# Patient Record
Sex: Male | Born: 1935
Health system: Southern US, Community
[De-identification: ages and names within clinical notes are randomized; demographics above are authoritative.]

## PROBLEM LIST (undated history)

## (undated) DIAGNOSIS — Z950 Presence of cardiac pacemaker: Secondary | ICD-10-CM

## (undated) DIAGNOSIS — I10 Essential (primary) hypertension: Secondary | ICD-10-CM

## (undated) DIAGNOSIS — I5189 Other ill-defined heart diseases: Secondary | ICD-10-CM

## (undated) DIAGNOSIS — I2510 Atherosclerotic heart disease of native coronary artery without angina pectoris: Secondary | ICD-10-CM

## (undated) DIAGNOSIS — I495 Sick sinus syndrome: Secondary | ICD-10-CM

## (undated) DIAGNOSIS — I499 Cardiac arrhythmia, unspecified: Secondary | ICD-10-CM

## (undated) DIAGNOSIS — M1990 Unspecified osteoarthritis, unspecified site: Secondary | ICD-10-CM

## (undated) DIAGNOSIS — IMO0002 Reserved for concepts with insufficient information to code with codable children: Secondary | ICD-10-CM

## (undated) DIAGNOSIS — K562 Volvulus: Secondary | ICD-10-CM

## (undated) DIAGNOSIS — H269 Unspecified cataract: Secondary | ICD-10-CM

## (undated) DIAGNOSIS — K5792 Diverticulitis of intestine, part unspecified, without perforation or abscess without bleeding: Secondary | ICD-10-CM

## (undated) DIAGNOSIS — H9190 Unspecified hearing loss, unspecified ear: Secondary | ICD-10-CM

## (undated) DIAGNOSIS — K5909 Other constipation: Secondary | ICD-10-CM

## (undated) DIAGNOSIS — E785 Hyperlipidemia, unspecified: Secondary | ICD-10-CM

## (undated) DIAGNOSIS — N400 Enlarged prostate without lower urinary tract symptoms: Secondary | ICD-10-CM

## (undated) HISTORY — DX: Essential (primary) hypertension: I10

## (undated) HISTORY — DX: Reserved for concepts with insufficient information to code with codable children: IMO0002

## (undated) HISTORY — DX: Volvulus: K56.2

## (undated) HISTORY — DX: Benign prostatic hyperplasia without lower urinary tract symptoms: N40.0

## (undated) HISTORY — DX: Presence of cardiac pacemaker: Z95.0

## (undated) HISTORY — DX: Other constipation: K59.09

## (undated) HISTORY — DX: Sick sinus syndrome: I49.5

## (undated) HISTORY — DX: Unspecified osteoarthritis, unspecified site: M19.90

## (undated) HISTORY — DX: Hyperlipidemia, unspecified: E78.5

## (undated) HISTORY — PX: VASECTOMY: SHX75

## (undated) HISTORY — DX: Diverticulitis of intestine, part unspecified, without perforation or abscess without bleeding: K57.92

---

## 2005-04-23 ENCOUNTER — Encounter (INDEPENDENT_AMBULATORY_CARE_PROVIDER_SITE_OTHER): Payer: Self-pay | Admitting: *Deleted

## 2005-04-23 ENCOUNTER — Ambulatory Visit (HOSPITAL_COMMUNITY): Admission: RE | Admit: 2005-04-23 | Discharge: 2005-04-23 | Payer: Self-pay | Admitting: Gastroenterology

## 2005-10-08 LAB — HM COLONOSCOPY

## 2006-07-10 ENCOUNTER — Encounter: Payer: Self-pay | Admitting: Internal Medicine

## 2007-01-30 ENCOUNTER — Ambulatory Visit: Payer: Self-pay | Admitting: Unknown Physician Specialty

## 2007-02-11 ENCOUNTER — Ambulatory Visit: Payer: Self-pay | Admitting: Unknown Physician Specialty

## 2008-12-01 ENCOUNTER — Encounter: Payer: Self-pay | Admitting: Internal Medicine

## 2009-07-12 ENCOUNTER — Ambulatory Visit: Payer: Self-pay | Admitting: Internal Medicine

## 2009-07-12 DIAGNOSIS — N401 Enlarged prostate with lower urinary tract symptoms: Secondary | ICD-10-CM

## 2009-07-12 DIAGNOSIS — E785 Hyperlipidemia, unspecified: Secondary | ICD-10-CM

## 2009-07-12 DIAGNOSIS — J309 Allergic rhinitis, unspecified: Secondary | ICD-10-CM | POA: Insufficient documentation

## 2009-07-12 DIAGNOSIS — I10 Essential (primary) hypertension: Secondary | ICD-10-CM | POA: Insufficient documentation

## 2009-07-12 DIAGNOSIS — N400 Enlarged prostate without lower urinary tract symptoms: Secondary | ICD-10-CM | POA: Insufficient documentation

## 2009-07-12 DIAGNOSIS — M1990 Unspecified osteoarthritis, unspecified site: Secondary | ICD-10-CM | POA: Insufficient documentation

## 2009-07-12 DIAGNOSIS — N138 Other obstructive and reflux uropathy: Secondary | ICD-10-CM

## 2009-07-13 LAB — CONVERTED CEMR LAB
CO2: 30 meq/L (ref 19–32)
Cholesterol: 230 mg/dL — ABNORMAL HIGH (ref 0–200)
Creatinine, Ser: 1.1 mg/dL (ref 0.4–1.2)
Direct LDL: 176.1 mg/dL
Eosinophils Relative: 1.4 % (ref 0.0–5.0)
GFR calc non Af Amer: 51.54 mL/min (ref >60)
HCT: 46.8 % — ABNORMAL HIGH (ref 36.0–46.0)
HDL: 44.8 mg/dL (ref >39.00)
Lymphocytes Relative: 32.2 % (ref 12.0–46.0)
Lymphs Abs: 1.9 10*3/uL (ref 0.7–4.0)
Monocytes Relative: 9.4 % (ref 3.0–12.0)
Neutrophils Relative %: 56.5 % (ref 43.0–77.0)
Platelets: 244 10*3/uL (ref 150.0–400.0)
Sodium: 140 meq/L (ref 135–145)
TSH: 1.29 microintl units/mL (ref 0.35–5.50)
Total Protein: 8.4 g/dL — ABNORMAL HIGH (ref 6.0–8.3)
Triglycerides: 102 mg/dL (ref 0.0–149.0)
VLDL: 20.4 mg/dL (ref 0.0–40.0)
WBC: 6 10*3/uL (ref 4.5–10.5)

## 2009-09-07 DIAGNOSIS — IMO0002 Reserved for concepts with insufficient information to code with codable children: Secondary | ICD-10-CM

## 2009-09-07 DIAGNOSIS — I480 Paroxysmal atrial fibrillation: Secondary | ICD-10-CM

## 2009-09-07 HISTORY — DX: Paroxysmal atrial fibrillation: I48.0

## 2009-09-07 HISTORY — DX: Reserved for concepts with insufficient information to code with codable children: IMO0002

## 2009-09-23 ENCOUNTER — Ambulatory Visit: Payer: Self-pay | Admitting: Cardiology

## 2009-09-23 ENCOUNTER — Encounter: Payer: Self-pay | Admitting: Family Medicine

## 2009-09-23 ENCOUNTER — Ambulatory Visit: Payer: Self-pay | Admitting: Family Medicine

## 2009-09-23 ENCOUNTER — Inpatient Hospital Stay (HOSPITAL_COMMUNITY): Admission: EM | Admit: 2009-09-23 | Discharge: 2009-09-25 | Payer: Self-pay | Admitting: Emergency Medicine

## 2009-09-27 ENCOUNTER — Telehealth: Payer: Self-pay | Admitting: Cardiology

## 2009-09-29 ENCOUNTER — Telehealth (INDEPENDENT_AMBULATORY_CARE_PROVIDER_SITE_OTHER): Payer: Self-pay | Admitting: *Deleted

## 2009-10-03 ENCOUNTER — Encounter: Payer: Self-pay | Admitting: Internal Medicine

## 2009-10-03 ENCOUNTER — Ambulatory Visit: Payer: Self-pay

## 2009-10-03 ENCOUNTER — Ambulatory Visit: Payer: Self-pay | Admitting: Cardiology

## 2009-10-03 ENCOUNTER — Ambulatory Visit (HOSPITAL_COMMUNITY): Admission: RE | Admit: 2009-10-03 | Discharge: 2009-10-03 | Payer: Self-pay | Admitting: Internal Medicine

## 2009-10-03 ENCOUNTER — Encounter: Payer: Self-pay | Admitting: Cardiology

## 2009-10-03 ENCOUNTER — Encounter (HOSPITAL_COMMUNITY): Admission: RE | Admit: 2009-10-03 | Discharge: 2009-12-12 | Payer: Self-pay | Admitting: Internal Medicine

## 2009-10-05 ENCOUNTER — Telehealth: Payer: Self-pay | Admitting: Internal Medicine

## 2009-10-06 ENCOUNTER — Ambulatory Visit: Payer: Self-pay | Admitting: Cardiology

## 2009-10-06 DIAGNOSIS — I498 Other specified cardiac arrhythmias: Secondary | ICD-10-CM

## 2009-10-06 DIAGNOSIS — I4891 Unspecified atrial fibrillation: Secondary | ICD-10-CM

## 2009-10-06 DIAGNOSIS — I480 Paroxysmal atrial fibrillation: Secondary | ICD-10-CM | POA: Insufficient documentation

## 2009-10-06 DIAGNOSIS — K7689 Other specified diseases of liver: Secondary | ICD-10-CM | POA: Insufficient documentation

## 2009-10-08 HISTORY — PX: OTHER SURGICAL HISTORY: SHX169

## 2009-10-10 ENCOUNTER — Telehealth: Payer: Self-pay | Admitting: Cardiology

## 2009-10-10 ENCOUNTER — Encounter: Payer: Self-pay | Admitting: Cardiology

## 2009-10-11 ENCOUNTER — Ambulatory Visit: Payer: Self-pay | Admitting: Cardiology

## 2009-10-11 ENCOUNTER — Encounter: Payer: Self-pay | Admitting: Cardiology

## 2009-10-12 ENCOUNTER — Telehealth: Payer: Self-pay | Admitting: Cardiology

## 2009-10-14 ENCOUNTER — Telehealth: Payer: Self-pay | Admitting: Cardiology

## 2009-10-17 ENCOUNTER — Telehealth: Payer: Self-pay | Admitting: Cardiology

## 2009-10-17 ENCOUNTER — Telehealth: Payer: Self-pay | Admitting: Internal Medicine

## 2009-10-17 ENCOUNTER — Ambulatory Visit: Payer: Self-pay | Admitting: Cardiovascular Disease

## 2009-10-20 ENCOUNTER — Ambulatory Visit: Payer: Self-pay | Admitting: Internal Medicine

## 2009-10-20 ENCOUNTER — Encounter: Payer: Self-pay | Admitting: Internal Medicine

## 2009-10-21 ENCOUNTER — Telehealth: Payer: Self-pay | Admitting: Internal Medicine

## 2009-10-24 LAB — CONVERTED CEMR LAB
Chloride: 99 meq/L (ref 96–112)
INR: 1.2 — ABNORMAL HIGH (ref 0.8–1.0)
Potassium: 3.8 meq/L (ref 3.5–5.1)
Prothrombin Time: 12.9 s — ABNORMAL HIGH (ref 9.1–11.7)
Sodium: 135 meq/L (ref 135–145)

## 2009-10-26 ENCOUNTER — Telehealth: Payer: Self-pay | Admitting: Internal Medicine

## 2009-10-27 ENCOUNTER — Ambulatory Visit: Payer: Self-pay | Admitting: Internal Medicine

## 2009-10-27 DIAGNOSIS — I495 Sick sinus syndrome: Secondary | ICD-10-CM

## 2009-11-03 ENCOUNTER — Inpatient Hospital Stay (HOSPITAL_COMMUNITY): Admission: RE | Admit: 2009-11-03 | Discharge: 2009-11-04 | Payer: Self-pay | Admitting: Internal Medicine

## 2009-11-03 ENCOUNTER — Ambulatory Visit: Payer: Self-pay | Admitting: Internal Medicine

## 2009-11-07 ENCOUNTER — Telehealth: Payer: Self-pay | Admitting: Internal Medicine

## 2009-11-07 ENCOUNTER — Ambulatory Visit: Payer: Self-pay | Admitting: Internal Medicine

## 2009-11-17 ENCOUNTER — Encounter: Payer: Self-pay | Admitting: Internal Medicine

## 2009-11-17 ENCOUNTER — Ambulatory Visit: Payer: Self-pay

## 2009-11-25 ENCOUNTER — Ambulatory Visit: Payer: Self-pay | Admitting: Internal Medicine

## 2009-11-25 DIAGNOSIS — F4322 Adjustment disorder with anxiety: Secondary | ICD-10-CM

## 2009-12-01 ENCOUNTER — Ambulatory Visit: Payer: Self-pay | Admitting: Cardiovascular Disease

## 2009-12-01 ENCOUNTER — Encounter: Payer: Self-pay | Admitting: Cardiology

## 2009-12-05 LAB — CONVERTED CEMR LAB
ALT: 19 units/L (ref 0–53)
AST: 18 units/L (ref 0–37)
Albumin: 4.5 g/dL (ref 3.5–5.2)
CO2: 26 meq/L (ref 19–32)
Calcium: 9.4 mg/dL (ref 8.4–10.5)
Chloride: 105 meq/L (ref 96–112)
Cholesterol: 237 mg/dL — ABNORMAL HIGH (ref 0–200)
Creatinine, Ser: 1.03 mg/dL (ref 0.40–1.50)
Potassium: 4.3 meq/L (ref 3.5–5.3)
Total CHOL/HDL Ratio: 4.2
Total Protein: 7.8 g/dL (ref 6.0–8.3)

## 2009-12-21 ENCOUNTER — Ambulatory Visit: Payer: Self-pay | Admitting: Cardiology

## 2009-12-28 ENCOUNTER — Telehealth: Payer: Self-pay | Admitting: Cardiology

## 2010-01-04 ENCOUNTER — Ambulatory Visit: Payer: Self-pay | Admitting: Cardiovascular Disease

## 2010-01-05 ENCOUNTER — Telehealth: Payer: Self-pay | Admitting: Cardiology

## 2010-01-05 ENCOUNTER — Encounter: Payer: Self-pay | Admitting: Cardiology

## 2010-01-10 ENCOUNTER — Telehealth: Payer: Self-pay | Admitting: Cardiology

## 2010-01-16 ENCOUNTER — Ambulatory Visit: Payer: Self-pay | Admitting: Internal Medicine

## 2010-01-16 ENCOUNTER — Ambulatory Visit: Payer: Self-pay | Admitting: Cardiology

## 2010-01-16 ENCOUNTER — Encounter: Payer: Self-pay | Admitting: Internal Medicine

## 2010-01-19 ENCOUNTER — Telehealth: Payer: Self-pay | Admitting: Cardiology

## 2010-01-20 ENCOUNTER — Ambulatory Visit: Payer: Self-pay | Admitting: Cardiology

## 2010-01-23 ENCOUNTER — Encounter: Payer: Self-pay | Admitting: Cardiology

## 2010-01-23 ENCOUNTER — Ambulatory Visit: Payer: Self-pay | Admitting: Internal Medicine

## 2010-02-06 ENCOUNTER — Ambulatory Visit: Payer: Self-pay | Admitting: Internal Medicine

## 2010-02-06 DIAGNOSIS — Z950 Presence of cardiac pacemaker: Secondary | ICD-10-CM

## 2010-02-06 DIAGNOSIS — Z45018 Encounter for adjustment and management of other part of cardiac pacemaker: Secondary | ICD-10-CM | POA: Insufficient documentation

## 2010-02-06 HISTORY — DX: Presence of cardiac pacemaker: Z95.0

## 2010-02-28 ENCOUNTER — Telehealth: Payer: Self-pay | Admitting: Cardiology

## 2010-04-17 ENCOUNTER — Ambulatory Visit: Payer: Self-pay | Admitting: Family Medicine

## 2010-04-17 DIAGNOSIS — M19019 Primary osteoarthritis, unspecified shoulder: Secondary | ICD-10-CM | POA: Insufficient documentation

## 2010-05-04 ENCOUNTER — Ambulatory Visit: Payer: Self-pay | Admitting: Internal Medicine

## 2010-05-04 DIAGNOSIS — L570 Actinic keratosis: Secondary | ICD-10-CM | POA: Insufficient documentation

## 2010-05-04 DIAGNOSIS — K5909 Other constipation: Secondary | ICD-10-CM

## 2010-07-11 ENCOUNTER — Ambulatory Visit: Payer: Self-pay | Admitting: Internal Medicine

## 2010-07-11 DIAGNOSIS — D179 Benign lipomatous neoplasm, unspecified: Secondary | ICD-10-CM | POA: Insufficient documentation

## 2010-09-08 ENCOUNTER — Ambulatory Visit: Payer: Self-pay | Admitting: Internal Medicine

## 2010-10-25 ENCOUNTER — Telehealth (INDEPENDENT_AMBULATORY_CARE_PROVIDER_SITE_OTHER): Payer: Self-pay | Admitting: *Deleted

## 2010-10-25 ENCOUNTER — Telehealth: Payer: Self-pay | Admitting: Cardiology

## 2010-11-05 LAB — CONVERTED CEMR LAB
Basophils Relative: 0.3 % (ref 0.0–3.0)
Chloride: 104 meq/L (ref 96–112)
Eosinophils Relative: 0.8 % (ref 0.0–5.0)
GFR calc non Af Amer: 77.47 mL/min (ref >60)
HCT: 45.6 % (ref 39.0–52.0)
INR: 1.1 — ABNORMAL HIGH (ref 0.8–1.0)
Lymphs Abs: 1.8 10*3/uL (ref 0.7–4.0)
MCV: 92.6 fL (ref 78.0–100.0)
Monocytes Absolute: 0.5 10*3/uL (ref 0.1–1.0)
Monocytes Relative: 10.8 % (ref 3.0–12.0)
Neutrophils Relative %: 51.8 % (ref 43.0–77.0)
Potassium: 4.3 meq/L (ref 3.5–5.1)
RBC: 4.92 M/uL (ref 4.22–5.81)
WBC: 5 10*3/uL (ref 4.5–10.5)

## 2010-11-07 NOTE — Progress Notes (Signed)
Summary: question regarding upcoming surgery  Phone Note Call from Patient Call back at Home Phone [phone]   Caller: Patient Reason for Call: Talk to Nurse Details for Reason: pt has question regarding upcoming surgery.  Initial call taken by: Lorne Skeens,  October 26, 2009 8:25 AM  Follow-up for Phone Call        pt to be seen tomorrow at 10:30.  Dr Ladona Ridgel aware.  Pt d/c'd Coumadin due to HA's may want to take Pradaxa and has questions regarding PPM thinks its going to cure his afib.  I let him know that the PPm would not cure his afib but would enable [place] to use meds to help control it.  He wants to talk with Dr Ladona Ridgel to have a better understanding before the 27th. Dennis Bast, RN, BSN  October 26, 2009 9:54 AM

## 2010-11-07 NOTE — Assessment & Plan Note (Signed)
Summary: EKG/AMD  Nurse Visit   Allergies: No Known Drug Allergies  Orders Added: 1)  EKG w/ Interpretation [93000] 

## 2010-11-07 NOTE — Procedures (Signed)
Summary: PC2   Allergies: No Known Drug Allergies  PPM Specifications Following MD:  [name], MD     PPM Vendor:  Medtronic     PPM Model Number:  [id number]     PPM Serial Number:  [id number] PPM DOI:  11/03/2009     PPM Implanting MD:  [name], MD  Lead 1    Location: RA     DOI: 11/03/2009     Model #: [id number]     Serial #: [id number]     Status: active Lead 2    Location: RV     DOI: 11/03/2009     Model #: [id number]     Serial #: [id number]     Status: active   Indications:  Sinus Brady/Afib   PPM Follow Up Remote Check?  No Battery Voltage:  2.80 V     Battery Est. Longevity:  8.5 YEARS     Pacer Dependent:  No       PPM Device Measurements Atrium  Amplitude: 5.6 mV, Impedance: 599 ohms, Threshold: 0.625 V at 0.4 msec Right Ventricle  Amplitude: 5.6 mV, Impedance: 736 ohms, Threshold: 0.5 V at 0.4 msec  Episodes MS Episodes:  0     Ventricular High Rate:  0     Atrial Pacing:  88.7%     Ventricular Pacing:  0.1%  Parameters Mode:  MVP(R)     Lower Rate Limit:  60     Upper Rate Limit:  130 Paced AV Delay:  150     Sensed AV Delay:  120 Rate Response Parameters:  ADL Response-3, Exertion Response-3, Threshold-Med/low, Acceleration-30 seconds, Deceleration-Exercise Tech Comments:  Pt seen as an add-on today because of concerns about a low pulse reading on digital blood pressure cuff at home.  Pt has frequent PAC's and occasional PVC's during check.  Normal device function. Histagrams flat, will reassess at wound check need for sensor adjustment.  Lead impedences, sensing, and thresholds stable.  Pt reassured.  Wound without edema, steri-strips will be removed at wound check appt on 2-10.  ROV as scheduled. Gypsy Balsam RN BSN  November 07, 2009 11:33 AM  MD Comments:  Agree with above.

## 2010-11-07 NOTE — Assessment & Plan Note (Signed)
Summary: NURSE/AMD  Nurse Visit   Patient Instructions: 1)  Your physician has recommended you make the following change in your medication: STOP MULTAQ, START FLECAINIDE 50MG  TWICE A DAY 2)  Your physician recommends that you schedule a follow-up appointment in: 1 week for EKG, 3-4 weeks with Dr. Shirlee Latch   Allergies: No Known Drug Allergies Prescriptions: FLECAINIDE ACETATE 50 MG TABS (FLECAINIDE ACETATE) Take one tablet by mouth every 12 hours  #60 x 6   Entered by:   Charlena Cross, RN, BSN   Authorized by:   Marca Ancona, MD   Signed by:   Charlena Cross, RN, BSN on 01/16/2010   Method used:   Electronically to        [facility] [address]* (retail)       [address]       [place], [place]  [address]       Ph: [phone]       Fax: [phone]   RxID:   [id number]    PPM Specifications Following MD:  [name], MD     Referring MD:  Jacklynn Barnacle Vendor:  Medtronic     PPM Model Number:  ADDRL1     PPM Serial Number:  [id number] PPM DOI:  11/03/2009     PPM Implanting MD:  [name], MD  Lead 1    Location: RA     DOI: 11/03/2009     Model #: [id number]     Serial #: [id number]     Status: active Lead 2    Location: RV     DOI: 11/03/2009     Model #: [id number]     Serial #: [id number]     Status: active  Magnet Response Rate:  BOL 85 ERI  65  Indications:  Sinus Brady/Afib   PPM Follow Up Remote Check?  Yes Battery Voltage:  2.8 V     Battery Est. Longevity:  8.5y     Pacer Dependent:  No       PPM Device Measurements Atrium  Amplitude: 4 mV,   Episodes Coumadin:  No  Parameters Mode:  MVP(R)     Lower Rate Limit:  60     Upper Rate Limit:  130 Paced AV Delay:  150     Sensed AV Delay:  120 Rate Response Parameters:  ADL Response-3, Exertion Response-3, Threshold-Med/low, Acceleration-30 seconds, Deceleration-Exercise

## 2010-11-07 NOTE — Progress Notes (Signed)
Summary: wants phone call   Phone Note Call from Patient Call back at Home Phone [phone]   Caller: Patient Call For: Cindee Salt MD Summary of Call: Patient is requesting phone call from you. He says that he is having problems with his heart again and he just wants you to give him a call at [phone]. Initial call taken by: Melody Comas,  October 17, 2009 12:21 PM  Follow-up for Phone Call        discussed with patient clearly having worrisome symptoms and will need to discuss options with the cardiologists Follow-up by: Cindee Salt MD,  October 17, 2009 1:52 PM

## 2010-11-07 NOTE — Progress Notes (Signed)
Summary: Questions  Phone Note Call from Patient Call back at Home Phone [phone]   Caller: Patient Call For: Dr. Alphonsus Sias Dr. Shirlee Latch Summary of Call: pt is calling with lots of concern with his heart. I advised pt that Dr. Alphonsus Sias is out of the office and will return on Monday. I also advised pt to call his cardiologist. Pt is not in pain, he is concerned about his irregular heart beat and his pulse. Pt is wearing a holter monitor now and will turn it  in tomorrow 10/13/2009. Pt states his heart goes in and out and pulse up and down. I told pt that the monitor would pick up those irregular beats and the would be more info for his cardiologist. Pt states he called here because he couldn't get in touch with his cardiologist and that Dr. Alphonsus Sias would answer his questions and relay this info to his cardiologist. I advised pt to try and call his cardiologist again and that I would send this note to both physicians. I asked again if he was in distress and he stated no. Initial call taken by: Mervin Hack CMA (AAMA),  October 12, 2009 2:41 PM     Appended Document: Questions I will look at the holter when he turns it in.  If he is having chest pain or his heart rate is fast, I can see him in the office.   Appended Document: Questions He may be back in atrial fibrillation

## 2010-11-07 NOTE — Letter (Signed)
Summary: Implantable Device Instructions  Architectural technologist, Main Office  [address]   [place], [place] [address]   Phone: [phone]  Fax: [phone]      Implantable Device Instructions  You are scheduled for:  _____ Permanent Transvenous Pacemaker   on 11/03/09 with Dr. Ladona Ridgel  1.  Please arrive at the Short Stay Center at [facility] at 8:00am on the day of your procedure.  2.  Do not eat or drink after midnight  the night before your procedure.  3.  Complete lab work on 10/27/09 in [employer] office    You do not have to be fasting.  4.    Take your last dose of Coumadin on--will call if you need to hold  5.  Plan for an overnight stay.  Bring your insurance cards and a list of your medications.  6.  Wash your chest and neck with antibacterial soap (any brand) the evening before and the morning of your procedure.  Rinse well.  7.  Education material received:     Pacemaker _____            *If you have ANY questions after you get home, please call the office [phone]. Anselm Pancoast  *Every attempt is made to prevent procedures from being rescheduled.  Due to the nauture of Electrophysiology, rescheduling can happen.  The physician is always aware and directs the staff when this occurs.

## 2010-11-07 NOTE — Cardiovascular Report (Signed)
Summary: Office Visit   Office Visit   Imported By: Roderic Ovens 11/09/2009 16:03:08  _____________________________________________________________________  External Attachment:    Type:   Image     Comment:   External Document

## 2010-11-07 NOTE — Assessment & Plan Note (Signed)
Summary: QUESTION REGUARDING STRESS ON RECENT PACEMAKER/JRR   Vital Signs:  Patient profile:   77 year old male Weight:      173 pounds Temp:     98.1 degrees F oral Pulse rate:   72 / minute Pulse rhythm:   regular BP sitting:   140 / 90  (left arm) Cuff size:   large  Vitals Entered By: Mervin Hack CMA Duncan Dull) (November 25, 2009 11:48 AM) CC: question about meds   History of Present Illness: had pacemaker put in about 3 weeks ago Inserted for tachy=brady syndrome  doing well in general He senses it fire off---may only be for bradycardia  Has had several anxiety spells he notes when his heart starts racing "It feels so strange for me to have something foreign in me"  Wife gave him 1/2 of her alprazolam 0.5mg  really helped him calm down Heart rate normalized, BP was better to his check No true panic symptoms has had some anxiety feelings--then he notes his heart speed up  No true chest pain but notes pain when "that thing kicks in" No SOB  Allergies: No Known Drug Allergies  Past History:  Past medical, surgical, family and social histories (including risk factors) reviewed for relevance to current acute and chronic problems.  Past Medical History: Reviewed history from 10/06/2009 and no changes required. 1. Hyperlipidemia 2. Hypertension 3. Osteoarthritis 4. BPH 5. Allergic rhinitis 6. Atrial fibrillation: First noted in 12/10.  Has been paroxysmal.  CHADSVASC score = 2 but patient does not want coumadin.  Echo (12/10): Mild LVH, EF 55%, no regional wall motion abnormalities, moderate diastolic dysfunction, mild MR, mild biatrial enlargement, normal RV.  7.  Sinus bradycardia: Has limited rate control meds for atrial fibrillation.  8.  Chest pain: Lexiscan myoview (12/10) with EF 55%, no ischemia or infarction.    Past Surgical History: Vasectomy in his 13's Dual chamber pacemaker 1/11 --Dr Ladona Ridgel  Family History: Reviewed history from 07/12/2009 and no  changes required. Mom died young from lung disease. Was smoker. Had HTN Dad died young of alcohol complications 1 brother, 2 sisters--general good health No CAD DM in great aunt No prostate or colon cancer  Social History: Reviewed history from 07/12/2009 and no changes required. 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 Has requested wife to make health care decisions for him as needed. Would accept CPR. Not sure about feeding tube.  Review of Systems       sleeps okay but has been awakened by pacemaker "kicking in" Appetite is okay  Physical Exam  General:  alert and normal appearance.   Neck:  supple, no masses, no thyromegaly, and no cervical lymphadenopathy.   Lungs:  normal respiratory effort and normal breath sounds.   Heart:  normal rate, regular rhythm, no murmur, and no gallop.   Extremities:  no edema Psych:  normally interactive, good eye contact, not anxious appearing, and not depressed appearing.     Impression & Recommendations:  Problem # 1:  ADJUSTMENT DISORDER WITH ANXIETY (ICD-309.24) Assessment New at first hard to tell whether tachycardia caused the anxiety or vice versa Likely anxiety first--since he thinks his rates are under 100  will give Rx for alprazolam for as needed use  Problem # 2:  SINOATRIAL NODE DYSFUNCTION (ICD-427.81) Assessment: Comment Only if persistent tachycardia, now can be treated since pacer in  His updated medication list for  this problem includes:    Aspirin Ec 325 Mg Tbec (Aspirin) .[name]... Take one tablet by mouth daily  Problem # 3:  HYPERTENSION (ICD-401.9) Assessment: Unchanged BP fine  His updated medication list for this problem includes:    Amlodipine Besylate 10 Mg Tabs (Amlodipine besylate) .[name]... Take 1 by mouth once daily  BP today: 140/90 Prior BP: 130/80 (10/27/2009)  Labs Reviewed: K+: 4.3  (10/27/2009) Creat: : 1.0 (10/27/2009)   Chol: 230 (07/12/2009)   HDL: 44.80 (07/12/2009)   TG: 102.0 (07/12/2009)  Complete Medication List: 1)  Amlodipine Besylate 10 Mg Tabs (Amlodipine besylate) .... Take 1 by mouth once daily 2)  Aspirin Ec 325 Mg Tbec (Aspirin) .... Take one tablet by mouth daily 3)  Ibuprofen 200 Mg Tabs (Ibuprofen) .... As needed 4)  Glucosamine 500 Mg Caps (Glucosamine sulfate) .... Once daily 5)  Alprazolam 0.25 Mg Tabs (Alprazolam) .[name].. 1 by mouth two times a day as needed for anxiety spells  Patient Instructions: 1)  Please keep follow up visit on April 11th Prescriptions: ALPRAZOLAM 0.25 MG TABS (ALPRAZOLAM) 1 by mouth two times a day as needed for anxiety spells  #30 x 0   Entered and Authorized by:   Cindee Salt MD   Signed by:   Cindee Salt MD on 11/25/2009   Method used:   Print then Give to Patient   RxID:   [id number]   Current Allergies (reviewed today): No known allergies

## 2010-11-07 NOTE — Assessment & Plan Note (Signed)
Summary: HOLTER/AMD  Nurse Visit   Allergies: No Known Drug Allergies  Orders Added: 1)  Holter Monitor [Holter Monitor] 

## 2010-11-07 NOTE — Miscellaneous (Signed)
  Per Dr. Shirlee Latch- stop amlopidine, start dilt 240mg    Clinical Lists Changes  Medications: Changed medication from AMLODIPINE BESYLATE 10 MG TABS (AMLODIPINE BESYLATE) take 1 by mouth once daily to DILTIAZEM HCL ER BEADS 240 MG XR24H-CAP (DILTIAZEM HCL ER BEADS) 1 tab by mouth daily - Signed Rx of DILTIAZEM HCL ER BEADS 240 MG XR24H-CAP (DILTIAZEM HCL ER BEADS) 1 tab by mouth daily;  #30 x 3;  Signed;  Entered by: Charlena Cross, RN, BSN;  Authorized by: Marca Ancona, MD;  Method used: Electronically to [facility] [address], [place], [place]  [address], Ph: [phone], Fax: [phone]    Prescriptions: DILTIAZEM HCL ER BEADS 240 MG XR24H-CAP (DILTIAZEM HCL ER BEADS) 1 tab by mouth daily  #30 x 3   Entered by:   Charlena Cross, RN, BSN   Authorized by:   Marca Ancona, MD   Signed by:   Charlena Cross, RN, BSN on 01/05/2010   Method used:   Electronically to        [facility] [address] (retail)       [address]       [place], [place]  [address]       Ph: [phone]       Fax: [phone]   RxID:   [id number]

## 2010-11-07 NOTE — Progress Notes (Signed)
Summary: pace maker  Phone Note Call from Patient Call back at Home Phone [phone]   Caller: Patient Reason for Call: Talk to Nurse Summary of Call: pt believes that his pacemaker is not working, pulse is in the 40s Initial call taken by: Migdalia Dk,  November 07, 2009 8:15 AM  Follow-up for Phone Call        Called worried that his HR is in the 40's today and has not been this way since his PPM was placed.  He is checking his HR with automactic BP cuff.  I explained to him that if he is having PAC's the cuff may not be picking this up.  I offered him a time to come in and have his device checked.  He is coming at 11:00 today Dennis Bast, RN, BSN  November 07, 2009 9:16 AM

## 2010-11-07 NOTE — Cardiovascular Report (Signed)
Summary: Pre Op Orders  Pre Op Orders   Imported By: Roderic Ovens 11/08/2009 12:26:05  _____________________________________________________________________  External Attachment:    Type:   Image     Comment:   External Document

## 2010-11-07 NOTE — Assessment & Plan Note (Signed)
Summary: [name]   Visit Type:  Follow-up Primary Provider:  Cindee Salt MD  CC:  sob and cp this am bp 165/110 and heart rate 117.  History of Present Illness: 77 yo with history of paroxysmal atrial fibrillation and HTN followed by Dr. Shirlee Latch.  He was hospitalized in 12/10 with atrial fibrillation and rapid ventricular response associated with chest tightness.  Patient noted the chest tightness initially.  He went to Dr. Karle Starch office and afib with RVR was noted.  In the ER, he went back into NSR.  In the hospital, he was in and out of atrial fibrillation but was discharged in sinus rhythm and was in sinus rhythm at his hospital follow up with Dr. Shirlee Latch.  While he was in the hospital, his heart rate dropped as low as in the 30s.  Diltiazem was stopped and he was started on acebutalol.  He had a stress myoview done 12/10 which showed no ischemia or infarction, but his heart rate was in the 30s on only 200 mg daily of acebutalol so this was stopped. He also was started on heparin/coumadin in the hospital but had some bleeding in his gums so this was stopped.   He is added on to the schedule today for evaluation of palpitations. He recently wore a Holter Monitor that showed sinus brady in the 40s  with episodes of atrial fibrillation with RVR, rate 140s. There were post termination pauses of 2.5 to 3.8 seconds.  He has done well over the last week. Today he was at home when he noticed palpitations and felt anxious. There was some tightness across his chest. He checked his pulse and it was 117. His blood pressure was elevated at 150. He sat down and the palpitations stopped. His heart rate was then in the 40s. He felt a little weak when his heart rate was slow. He has not been on any rate control medications since discharge. He has been taking a full strength ASA.   Current Medications (verified): 1)  Amlodipine Besylate 10 Mg Tabs (Amlodipine Besylate) .... Take 1 By Mouth Once Daily 2)  Aspirin  Ec 325 Mg Tbec (Aspirin) .... Take One Tablet By Mouth Daily  Allergies (verified): No Known Drug Allergies  Past History:  Past Medical History: Reviewed history from 10/06/2009 and no changes required. 1. Hyperlipidemia 2. Hypertension 3. Osteoarthritis 4. BPH 5. Allergic rhinitis 6. Atrial fibrillation: First noted in 12/10.  Has been paroxysmal.  CHADSVASC score = 2 but patient does not want coumadin.  Echo (12/10): Mild LVH, EF 55%, no regional wall motion abnormalities, moderate diastolic dysfunction, mild MR, mild biatrial enlargement, normal RV.  7.  Sinus bradycardia: Has limited rate control meds for atrial fibrillation.  8.  Chest pain: Lexiscan myoview (12/10) with EF 55%, no ischemia or infarction.    Social History: Reviewed history from 07/12/2009 and no changes required. 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 Has requested wife to make health care decisions for him as needed. Would accept CPR. Not sure about feeding tube.  Review of Systems       The patient complains of fatigue, chest pain, and palpitations.  The patient denies malaise, fever, weight gain/loss, vision loss, decreased hearing, hoarseness, shortness of breath, prolonged cough, wheezing, sleep apnea, coughing up blood, abdominal pain, blood in stool, nausea, vomiting, diarrhea, heartburn, incontinence, blood in urine, muscle weakness, joint pain, leg swelling,  rash, skin lesions, headache, fainting, dizziness, depression, anxiety, enlarged lymph nodes, easy bruising or bleeding, and environmental allergies.    Vital Signs:  Patient profile:   77 year old male Height:      71 inches Weight:      182.25 pounds BMI:     25.51 Pulse rate:   58 / minute Pulse rhythm:   regular BP sitting:   122 / 70  (left arm) Cuff size:   regular  Vitals Entered By: [name] (October 17, 2009 4:04  PM)  Physical Exam  General:  General: Well developed, well nourished, NAD Musculoskeletal: Muscle strength 5/5 all ext Psychiatric: Mood and affect normal Neck: No JVD, no carotid bruits, no thyromegaly, no lymphadenopathy. Lungs:Clear bilaterally, no wheezes, rhonci, crackles CV: Bradycardic, No murmurs, gallops rubs Abdomen: soft, NT, ND, BS present Extremities: No edema, pulses 2+.    EKG  Procedure date:  10/17/2009  Findings:      Sinus bradycardia, rate 51 bpm. LAD. IVCD.  Holter Monitor  Procedure date:  10/17/2009  Findings:      Sinus rhythm with bradycardia to 40's. Atrial fibrillation with RVR to 140s and 2.5 to 3.8 second post termination pauses  Impression & Recommendations:  Problem # 1:  ATRIAL FIBRILLATION (ICD-427.31)  Tachy-brady syndrome. He has not tolerated rate control agents. His resting heart rate while in sinus is in the 40's-50s and when he goes into atrial fibrillation, there is RVR with rates in the 140s. He is clearly having episodes of atrial fib. I have discussed this at length with Dr. Shirlee Latch. I will begin coumadin today at 5mg  per day and he will be followed in our coumadin clinic. He will stop this if he develops any bleeding. He may need a permanent pacemaker. We also may consider hospitalization for Tikosyn load. I will arrange an appointment with Dr. Ladona Ridgel this week in the [facility] office to discuss options.    His updated medication list for this problem includes:    Aspirin Ec 325 Mg Tbec (Aspirin) .[name]... Take one tablet by mouth daily    Warfarin Sodium 5 Mg Tabs (Warfarin sodium) ..... Use as directed by anticoagulation clinic  His updated medication list for this problem includes:    Aspirin Ec 325 Mg Tbec (Aspirin) .[name]... Take one tablet by mouth daily    Warfarin Sodium 5 Mg Tabs (Warfarin sodium) ..... Use as directed by anticoagulation clinic  Patient Instructions: 1)  You have been referred to Dr. Ladona Ridgel EP specialist  1/13 @ 2:45 pm 2)  Your physician has recommended you make the following change in your medication: coumadin 5 mg daily. Prescriptions: WARFARIN SODIUM 5 MG TABS (WARFARIN SODIUM) Use as directed by Anticoagulation Clinic  #30 x 3   Entered by:   Charlena Cross, RN, BSN   Authorized by:   Verne Carrow, MD   Signed by:   Charlena Cross, RN, BSN on 10/17/2009   Method used:   Electronically to        [facility] [phone]* (retail)       [address] [place], [place]  [address]       Ph: [phone] or [phone]       Fax: [phone]   RxID:   [id number]

## 2010-11-07 NOTE — Progress Notes (Signed)
----   Converted from flag ---- ---- 01/05/2010 9:51 AM, Marca Ancona, MD wrote: I thought he was on diltiazem but looks like he is on amlodipine.  He can switch from amlodipine to diltiazem CD 240 mg daily but needs BP monitoring.   ---- 01/04/2010 1:18 PM, Charlena Cross, RN, BSN wrote:   ---- 01/04/2010 1:18 PM, Charlena Cross, RN, BSN wrote: Elvina Sidle Dr. Shirlee Latch- We actually had lunch with the reps for Multaq today and I mentioned Mr Chan and his breakthrough issues.  The reps say that multaq should really be given in conjunction with a rate control med (beta blocker, etc).  Dr. Mariah Milling says he uses metop to help.  Just thought I would pass that along since [name] isnt on anything rate rate. Melissa  ---- 01/04/2010 12:35 PM, Charlena Cross, RN, BSN wrote: [name] was in today for his f/u EKG.  Pt states that he doesnt feel like multaq is "doing any good." Still c/o heart racing at times.  EKG today showed paced 66.  Please advice.  Melissa  ---- 01/03/2010 5:33 PM, Marca Ancona, MD wrote: yes, much lower risk of side effects.    ---- 01/03/2010 3:49 PM, Charlena Cross, RN, BSN wrote: [name] is non formulary for [name]- preferred are sotalol or amiodarone.  Do you want me to try to prior auth multaq?  Do you have reasoning for wanting this med? Thanks! Melissa ------------------------------  Appended Document:  Make sure he is taking diltiazem 240 mg daily instead of Norvasc and see how he does with that in addition to Multaq.  Make sure he takes Multaq with food. If he continues to feel like his heart is racing, we can change to flecainide.

## 2010-11-07 NOTE — Procedures (Signed)
Summary: Holter and Event  Holter and Event   Imported By: [name] 10/19/2009 12:28:46  _____________________________________________________________________  External Attachment:    Type:   Image     Comment:   External Document

## 2010-11-07 NOTE — Progress Notes (Signed)
Summary: PROBLEMS  Phone Note Call from Patient Call back at Home Phone [phone]   Caller: SELF Call For: [facility] Summary of Call: PT FEELS LIKE HE IS GOING IN AFIB AND THAT IT WONT STOP-STATES THAT HIS BP AND HEART RATE ARE UP Initial call taken by: Harlon Flor,  January 19, 2010 4:06 PM  Follow-up for Phone Call        pt to see Dr. Shirlee Latch tomorrow. Follow-up by: Charlena Cross, RN, BSN,  January 19, 2010 4:58 PM

## 2010-11-07 NOTE — Assessment & Plan Note (Signed)
Summary: PERSONAL/CLE   Vital Signs:  Patient profile:   77 year old male Weight:      173.25 pounds Temp:     98.6 degrees F oral Pulse rate:   70 / minute Pulse rhythm:   regular BP sitting:   148 / 80  (left arm) Cuff size:   regular  Vitals Entered By: Janee Morn CMA (May 04, 2010 3:59 PM) CC: Right shoulder pain/Constipation/check places on face   History of Present Illness: Had some shoulder problems Got shot from Dr Patsy Lager but that didn't help Pain pills not helping either--tramadol  doesn't remember any injury but had been playing more golf seems worse when sitting around Plays golf 3-4 times per week --not painful then bad when in bed  Taking ibuprofen 400mg  up to two times a day  this does help can sleep if he takes the xanax--but this throws off his putting the next morning   Troubled with constipation MOM occ helps metamucil not much help  also lesion on left face flakes and scrapes off then comes back  Allergies: No Known Drug Allergies  Past History:  Past medical, surgical, family and social histories (including risk factors) reviewed for relevance to current acute and chronic problems.  Past Medical History: Reviewed history from 01/20/2010 and no changes required. 1. Hyperlipidemia 2. Hypertension 3. Osteoarthritis 4. BPH 5. Allergic rhinitis 6. Atrial fibrillation: First noted in 12/10.  Has been paroxysmal.  CHADSVASC score = 2 but patient has not tolerated coumdin due to headache.  Echo (12/10): Mild LVH, EF 55%, no regional wall motion abnormalities, moderate diastolic dysfunction, mild MR, mild biatrial enlargement, normal RV.  Patient is very symptomatic with palpitations when he goes into atrial fibrillation.  Dronedarone was tried but he had breakthrough.  By [facility] interrogation, episodes of afib decreased in frequency and duration on dronedarone but still occurred.  Flecainide is now being tried.   7.  Sick sinus syndrome: Status post dual  chamber Medtronic PCM.  8.  Chest pain: Lexiscan myoview (12/10) with EF 55%, no ischemia or infarction.    Past Surgical History: Reviewed history from 11/25/2009 and no changes required. Vasectomy in his 41's Dual chamber pacemaker 1/11 --Dr Ladona Ridgel  Family History: Reviewed history from 07/12/2009 and no changes required. Mom died young from lung disease. Was smoker. Had HTN Dad died young of alcohol complications 1 brother, 2 sisters--general good health No CAD DM in great aunt No prostate or colon cancer  Social History: Reviewed history from 01/20/2010 and no changes required. 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 Has requested wife to make health care decisions for him as needed. Would accept CPR. Not sure about feeding tube.  Review of Systems       No sig stomach troubles occ heartburn  Physical Exam  General:  alert.  NAD Msk:  no swelling or tenderness of right shoulder abduction up to 180 degrees,  Mild decreases in internal and external rotation Skin:  actinic left preauricular area   Impression & Recommendations:  Problem # 1:  ARTHRITIS, RIGHT SHOULDER (ICD-716.91) Assessment Unchanged injection didn't help no worrisome findings will increase the ibuprofen up to 3 at bedtime If not improving, can consider hydrocodone  Problem # 2:  OTHER CONSTIPATION (ICD-564.09) Assessment: Comment Only  will have him try the miralax  His updated medication list for this problem includes:    Miralax  Powd (Polyethylene glycol 3350) .[name]... 1 capful mixed with water daily to prevent constipation  Problem # 3:  ACTINIC KERATOSIS (ICD-702.0) Assessment: New  left preauricular lesion treated with liquid nitrogen 40 seconds x 2 tolerated well  Orders: Cryotherapy/Destruction benign or premalignant lesion (1st lesion)  (17000)  Complete Medication List: 1)   Diltiazem Hcl Er Beads 240 Mg Xr24h-cap (Diltiazem hcl er beads) .[name].. 1 tab by mouth daily 2)  Aspirin Ec 325 Mg Tbec (Aspirin) .... Take one tablet by mouth daily 3)  Ibuprofen 200 Mg Tabs (Ibuprofen) .... 2-3 tabs two times a day as needed for shoulder pain 4)  Glucosamine 500 Mg Caps (Glucosamine sulfate) .... Once daily 5)  Alprazolam 0.25 Mg Tabs (Alprazolam) .[name].. 1 by mouth two times a day as needed for anxiety spells 6)  Flecainide Acetate 50 Mg Tabs (Flecainide acetate) .[name].. 1 tablet by mouth two times a day 7)  Miralax Powd (Polyethylene glycol 3350) .[name].. 1 capful mixed with water daily to prevent constipation  Patient Instructions: 1)  Please increase ibuprofen to 600mg  at bedtime. Call for a stronger medicine if not improved in the next couple of weeks 2)  Try miralax (glycolax) daily for the constipation 3)  Please schedule a follow-up appointment as needed .  Prescriptions: ALPRAZOLAM 0.25 MG TABS (ALPRAZOLAM) 1 by mouth two times a day as needed for anxiety spells  #30 x 1   Entered and Authorized by:   Cindee Salt MD   Signed by:   Cindee Salt MD on 05/04/2010   Method used:   Print then Give to Patient   RxID:   [id number]   Current Allergies (reviewed today): No known allergies

## 2010-11-07 NOTE — Progress Notes (Signed)
Summary: MEDICATION PROBLEMS  Phone Note Call from Patient Call back at Home Phone [phone]   Caller: SELF Call For: [facility] Summary of Call: PT HAD A REACTION TO THE BP MED-ELEVATED HEART RATE-PT DISCONTINUED THE MED AND HIS HEART RATE HAS REGULATED Initial call taken by: Harlon Flor,  October 10, 2009 8:42 AM  Follow-up for Phone Call        pt had taken both prava and lisinopril the day after appt.  pt states that he went into afib shortly after taking medication.  pt thought that this was most likely a coincidence so he took them again the next day.  pt has same reaction.  stopped both lisinopril and prava. states that BP has been running better on the norvasc- 110-130/70's. instructed pt to take prava tonight and that if he does not have s/s of afib to continue on medication.  will call next week for BP check. Follow-up by: Charlena Cross, RN, BSN,  October 10, 2009 12:47 PM

## 2010-11-07 NOTE — Progress Notes (Signed)
Summary: BP  Phone Note Call from Patient Call back at Home Phone [phone]   Caller: SELF Call For: [facility] Summary of Call: PULSE RATE IS GETTING DOWN TO 31 Initial call taken by: Harlon Flor,  October 14, 2009 9:46 AM  Follow-up for Phone Call        left message on cell phone and with wife to call back. Follow-up by: Charlena Cross, RN, BSN,  October 14, 2009 3:00 PM  Additional Follow-up for Phone Call Additional follow up Details #1::        spoke with pt.  stated that he felt "slow" this morning so he took his BP.  HR was 31.  repeated in 10 minutes and HR was 32.  Pt is not taking lisinopril at this time.  Pt moved around some and HR returned to baseline.  Instructed pt that his HR should never go that slow and that if this happens again he is to seek medical attention- and not drive himself to the hospital. Has appt with Graciela Husbands on 2/21. Additional Follow-up by: Charlena Cross, RN, BSN,  October 14, 2009 4:00 PM

## 2010-11-07 NOTE — Progress Notes (Signed)
Summary: PROBLEMS  Phone Note Call from Patient Call back at Home Phone [phone]   Caller: SELF Call For: [facility] Summary of Call: PT IS HAVING SEVERE HEADACHES FROM THE COUMADIN Initial call taken by: Harlon Flor,  October 21, 2009 10:33 AM  Follow-up for Phone Call        pt states that since starting coumadin he has had splitting headaches to the point where he cant sleep.  based on blood work yesterday, pt's inr is 1.2 and coumadin needs to be adjusted.  pt does not want to increase dose at this time.  will contact Dr. Ladona Ridgel to advise.  Follow-up by: Charlena Cross, RN, BSN,  October 21, 2009 11:03 AM  Additional Follow-up for Phone Call Additional follow up Details #1::        called pt to see how he has been doing.  pt has stopped taking coumadin and headaches have resolved.  is taking a regular aspirin.  Instructed pt that I would let Dr. Ladona Ridgel know this.  Additional Follow-up by: Charlena Cross, RN, BSN,  October 24, 2009 4:16 PM    Additional Follow-up for Phone Call Additional follow up Details #2::    ok  Follow-up by: Laren Boom, MD, [facility],  October 26, 2009 4:06 PM

## 2010-11-07 NOTE — Progress Notes (Signed)
Summary: patient thinks he is in afib  Phone Note Call from Patient Call back at [facility] Phone [phone]   Caller: Patient Reason for Call: Talk to Nurse Summary of Call: Patient feels he is in afib again.  (15-20 minutes ago)  Pulse rate is up and blood pressure is up. Initial call taken by: Burnard Leigh,  October 17, 2009 11:53 AM  Follow-up for Phone Call        patient also called Dr. Alphonsus Sias, pt states his BP was 168-85 and heart rate 108, I told pt I would verbally ask Dr. Alphonsus Sias what should pt do? I also advised pt to call his cardiologist while I ask Dr. Alphonsus Sias. Per Dr. Alphonsus Sias pt needs to be seen. DeShannon Smith CMA Duncan Dull)  October 17, 2009 12:05 PM   Additional Follow-up for Phone Call Additional follow up Details #1::        pt to see Dr. Clifton James this afternoon @ 330. Additional Follow-up by: Charlena Cross, RN, BSN,  October 17, 2009 12:40 PM    Additional Follow-up for Phone Call Additional follow up Details #2::    noted Follow-up by: Cindee Salt MD,  October 17, 2009 1:35 PM

## 2010-11-07 NOTE — Procedures (Signed)
Summary: F8M/AMD   Visit Type:  Follow-up Primary Provider:  Cindee Salt MD  CC:  Follow up 6 months.  History of Present Illness: .Mr. [name] returns today for followup of atrial fibrillation and HTN.  He underwent PPM several months ago for symptomatic tachy-brady syndrome.  Since then he has tried several anti-arrhythmic meds including Dronenderone and low dose flecainide. He denies c/p or sob but does note that his blood pressure has been elevated.  He denies syncope and is playing golf 3-4 times a week.  Current Medications (verified): 1)  Diltiazem Hcl Er Beads 240 Mg Xr24h-Cap (Diltiazem Hcl Er Beads) .[name].. 1 Tab By Mouth Daily 2)  Alprazolam 0.25 Mg Tabs (Alprazolam) .[name].. 1 By Mouth Two Times A Day As Needed For Anxiety Spells 3)  Flecainide Acetate 50 Mg Tabs (Flecainide Acetate) .[name].. 1 Tablet By Mouth Two Times A Day 4)  Miralax  Powd (Polyethylene Glycol 3350) .[name].. 1 Capful Mixed With Water Daily To Prevent Constipation 5)  Aspirin Ec 325 Mg Tbec (Aspirin) .... Take One Tablet By Mouth Daily 6)  Ibuprofen 200 Mg Tabs (Ibuprofen) .... 2-3 Tabs Two Times A Day As Needed For Shoulder Pain 7)  Glucosamine 500 Mg Caps (Glucosamine Sulfate) .... Once Daily  Allergies (verified): No Known Drug Allergies  Past History:  Past Medical History: Last updated: 01/20/2010 1. Hyperlipidemia 2. Hypertension 3. Osteoarthritis 4. BPH 5. Allergic rhinitis 6. Atrial fibrillation: First noted in 12/10.  Has been paroxysmal.  CHADSVASC score = 2 but patient has not tolerated coumdin due to headache.  Echo (12/10): Mild LVH, EF 55%, no regional wall motion abnormalities, moderate diastolic dysfunction, mild MR, mild biatrial enlargement, normal RV.  Patient is very symptomatic with palpitations when he goes into atrial fibrillation.  Dronedarone was tried but he had breakthrough.  By [facility] interrogation, episodes of afib decreased in frequency and duration on dronedarone but still occurred.   Flecainide is now being tried.   7.  Sick sinus syndrome: Status post dual chamber Medtronic PCM.  8.  Chest pain: Lexiscan myoview (12/10) with EF 55%, no ischemia or infarction.    Past Surgical History: Last updated: 11/25/2009 Vasectomy in his 66's Dual chamber pacemaker 1/11 --Dr Ladona Ridgel  Review of Systems  The patient denies chest pain, syncope, dyspnea on exertion, and peripheral edema.    Vital Signs:  Patient profile:   77 year old male Height:      71 inches Weight:      175 pounds BMI:     24.50 Pulse rate:   67 / minute BP sitting:   158 / 92  (left arm) Cuff size:   large  Vitals Entered By: Bishop Dublin, CMA (September 08, 2010 10:01 AM)  Physical Exam  General:  alert and normal appearance.   Head:  normocephalic and atraumatic Eyes:  pupils equal, pupils round, pupils reactive to light, and no optic disk abnormalities.   Mouth:  no erythema and no lesions.   Neck:  Neck supple, no JVD. No masses, thyromegaly or abnormal cervical nodes. Chest Wall:  Well healed PPM incision. Lungs:  normal respiratory effort and normal breath sounds.   Heart:  RRR with normal S1 and S2. PMI is not enlarged or lalterally displaced.  Abdomen:  Bowel sounds positive; abdomen soft and non-tender without masses, organomegaly, or hernias noted. No hepatosplenomegaly. Msk:  fairly normal ROM of right shoulder with crepitus no effusion or joint findings Pulses:  1+ in feet Extremities:  No  clubbing or cyanosis. Neurologic:  Alert and oriented x 3.   PPM Specifications Following MD:  [name], MD     Referring MD:  Jacklynn Barnacle Vendor:  Medtronic     PPM Model Number:  [id number]     PPM Serial Number:  [id number] [facility] PPM DOI:  11/03/2009     PPM Implanting MD:  [name], MD  Lead 1    Location: RA     DOI: 11/03/2009     Model #: [id number]     Serial #: [id number]     Status: active Lead 2    Location: RV     DOI: 11/03/2009     Model #: [id number]     Serial #: [id number]     Status:  active  Magnet Response Rate:  BOL 85 ERI  65  Indications:  Sinus Brady/Afib   PPM Follow Up Battery Voltage:  2.80 V     Battery Est. Longevity:  13.5 yrs     Pacer Dependent:  No       PPM Device Measurements Atrium  Amplitude: 4.00 mV, Impedance: 600 ohms, Threshold: 0.750 V at 0.40 msec Right Ventricle  Amplitude: 8.00 mV, Impedance: 649 ohms, Threshold: 0.750 V at 0.40 msec  Episodes MS Episodes:  17     Percent Mode Switch:  0.2%     Coumadin:  No Ventricular High Rate:  2     Atrial Pacing:  96.2%     Ventricular Pacing:  0.2%  Parameters Mode:  MVP(R)     Lower Rate Limit:  60     Upper Rate Limit:  130 Paced AV Delay:  150     Sensed AV Delay:  120 Rate Response Parameters:  ADL Response-3, Exertion Response-3, Threshold-Med/low, Acceleration-30 seconds, Deceleration-Exercise Next Cardiology Appt Due:  03/09/2011 Tech Comments:  17 MODE SWITCHES--LONGEST WAS 6 HRS 37 MINUTES.  -COUMADIN.  +ASA.  NORMAL DEVICE FUNCTION.  CHANGED RA OUTPUT FROM 1.5 TO 2.0 AND RV OUTPUT FROM 2.0 TO 2.5 V. ROV IN 6 MTHS W/DEVICE CLINIC. Vella Kohler  September 08, 2010 10:03 AM MD Comments:  Agree with above.  Impression & Recommendations:  Problem # 1:  ATRIAL FIBRILLATION (ICD-427.31) He is mostly maintaining NSR on low dose flecainide.  I continue to recommend anti-coagulation and he continues to refuse. His updated medication list for this problem includes:    Flecainide Acetate 50 Mg Tabs (Flecainide acetate) .[name]... 1 tablet by mouth two times a day    Aspirin Ec 325 Mg Tbec (Aspirin) .[name]... Take one tablet by mouth daily  Problem # 2:  CARDIAC PACEMAKER IN SITU (ICD-V45.01) His device is working normally.  Will recheck in several months.  Problem # 3:  HYPERTENSION (ICD-401.9) His blood pressure is not well controlled and I will increase his cardizem. His updated medication list for this problem includes:    Diltiazem Hcl Er Beads 360 Mg Xr24h-cap (Diltiazem hcl er beads) .[name]...  Take one capsule by mouth daily    Aspirin Ec 325 Mg Tbec (Aspirin) .[name]... Take one tablet by mouth daily  His updated medication list for this problem includes:    Diltiazem Hcl Er Beads 360 Mg Xr24h-cap (Diltiazem hcl er beads) .[name]... Take one capsule by mouth daily    Aspirin Ec 325 Mg Tbec (Aspirin) .[name]... Take one tablet by mouth daily  Patient Instructions: 1)  Your physician recommends that you schedule a follow-up appointment in: 1 year 2)  Your physician has recommended you  make the following change in your medication: Increase Diltiazem to 360mg  once daily  Prescriptions: DILTIAZEM HCL ER BEADS 360 MG XR24H-CAP (DILTIAZEM HCL ER BEADS) Take one capsule by mouth daily  #30 x 12   Entered by:   Cloyde Reams RN   Authorized by:   Laren Boom, MD, [facility]   Signed by:   Cloyde Reams RN on 09/08/2010   Method used:   Electronically to        [facility] [address]* (retail)       [address]       [place], [place]  [address]       Ph: [phone]       Fax: [phone]   RxID:   [id number]

## 2010-11-07 NOTE — Procedures (Signed)
Summary: [facility]   Current Medications (verified): 1)  Amlodipine Besylate 10 Mg Tabs (Amlodipine Besylate) .... Take 1 By Mouth Once Daily 2)  Aspirin Ec 325 Mg Tbec (Aspirin) .... Take One Tablet By Mouth Daily 3)  Ibuprofen 200 Mg Tabs (Ibuprofen) .... As Needed  Allergies (verified): No Known Drug Allergies  PPM Specifications Following MD:  [name], MD     PPM Vendor:  Medtronic     PPM Model Number:  ADDRL1     PPM Serial Number:  [id number] PPM DOI:  11/03/2009     PPM Implanting MD:  [name], MD  Lead 1    Location: RA     DOI: 11/03/2009     Model #: [id number]     Serial #: [id number]     Status: active Lead 2    Location: RV     DOI: 11/03/2009     Model #: 6213     Serial #: [id number]     Status: active  Magnet Response Rate:  BOL 85 ERI  65  Indications:  Sinus Brady/Afib   PPM Follow Up Remote Check?  No Battery Voltage:  2.80 V     Battery Est. Longevity:  8.5 years     Pacer Dependent:  No       PPM Device Measurements Atrium  Amplitude: 5.6 mV, Impedance: 567 ohms, Threshold: 0.5 V at 0.4 msec Right Ventricle  Amplitude: 5.6 mV, Impedance: 714 ohms, Threshold: 0.75 V at 0.4 msec  Episodes MS Episodes:  3     Percent Mode Switch:  4.7%     Coumadin:  No Ventricular High Rate:  0     Atrial Pacing:  92.4%     Ventricular Pacing:  0.5%  Parameters Mode:  MVP(R)     Lower Rate Limit:  60     Upper Rate Limit:  130 Paced AV Delay:  150     Sensed AV Delay:  120 Rate Response Parameters:  ADL Response-3, Exertion Response-3, Threshold-Med/low, Acceleration-30 seconds, Deceleration-Exercise Next Cardiology Appt Due:  02/05/2010 Tech Comments:  No parameter changes.  3 mode switch episodes the longes was > 10 hours.  He is not on coumadin.  CHADSVASC score = 2.   Steri strips removed, no redness or edema.  I will discuss the recent A-fib episodes with Dr. Ladona Ridgel.  At this point he is scheduled for follow up in [place] in 3 months.   Per Dr. Ladona Ridgel, continue with  medications as above and he will see him in as scheduled. Altha Harm, LPN  November 18, 2009 4:53 PM  Altha Harm, LPN  November 17, 2009 11:54 AM

## 2010-11-07 NOTE — Progress Notes (Signed)
Summary: HEART PROBLEMS  Phone Note Call from Patient Call back at Home Phone [phone]   Caller: SELF Call For: [facility] Summary of Call: HR GOT TO 117 BP WAS 157/111 STARTED GETTING PAINS IN HIS CHEST-THIS WAS 10 MINUTES AGO Initial call taken by: Harlon Flor,  October 17, 2009 12:09 PM  Follow-up for Phone Call        pt states that CP has resolved.  instructed pt that based on holter he is in afib, per ruth pt has been having pauses on monitor.  instructed pt that if CP returns or pulse drops below 50 to seek medical attention. Has appt tomorrow with Shirlee Latch

## 2010-11-07 NOTE — Cardiovascular Report (Signed)
Summary: Office Visit   Office Visit   Imported By: Roderic Ovens 12/05/2009 12:41:03  _____________________________________________________________________  External Attachment:    Type:   Image     Comment:   External Document

## 2010-11-07 NOTE — Assessment & Plan Note (Signed)
Summary: CPX/RBH   Vital Signs:  Patient profile:   77 year old male Weight:      183 pounds Temp:     98.3 degrees F oral Pulse rate:   76 / minute Pulse rhythm:   regular BP sitting:   122 / 80  (left arm) Cuff size:   large  Vitals Entered By: Mervin Hack CMA Duncan Dull) (January 16, 2010 11:59 AM) CC: adult physical   History of Present Illness: Still having trouble with the atrial fib Tried  the multaq---felt that the atrial fib was worse! 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  Past Medical History: Reviewed history from 12/21/2009 and no changes required. 1. Hyperlipidemia 2. Hypertension 3. Osteoarthritis 4. BPH 5. Allergic rhinitis 6. Atrial fibrillation: First noted in 12/10.  Has been paroxysmal.  CHADSVASC score = 2 but patient has not tolerated coumdin due to headache.  Echo (12/10): Mild LVH, EF 55%, no regional wall motion abnormalities, moderate diastolic dysfunction, mild MR, mild biatrial enlargement, normal RV.  7.  Sick sinus syndrome: Status post dual chamber Medtronic PCM.  8.  Chest pain: Lexiscan myoview (12/10) with EF 55%, no ischemia or infarction.    Past Surgical History: Reviewed history from 11/25/2009 and no changes required. Vasectomy in his 60's Dual chamber pacemaker 1/11 --Dr Ladona Ridgel  Family History: Reviewed history from 07/12/2009 and no changes required. Mom died young from lung disease. Was smoker. Had  HTN Dad died young of alcohol complications 1 brother, 2 sisters--general good health No CAD DM in great aunt No prostate or colon cancer  Social History: Reviewed history from 07/12/2009 and no changes required. 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 Has requested wife to make health care decisions for him as needed. Would accept CPR. Not sure about feeding tube.  Review of Systems General:  weight stable occ sleep problems--xanax helps wears seat belt. Eyes:  Denies double vision and vision loss-1 eye. ENT:  Denies decreased hearing and ringing in ears; teeth okay--regular with dentist. CV:  Complains of chest pain or discomfort, difficulty breathing at night, and palpitations; denies difficulty breathing while lying down and shortness of breath with exertion; occ sharp sensation across upper chest--if heart speeds up Occ SOB in bed--feels better if sits up or if uses the xanax. Resp:  Denies cough and shortness of breath. GI:  Complains of constipation and indigestion; denies bloody stools, dark tarry stools, nausea, and vomiting; occ heartburn---better wtih tums (no more than once a week) Depends on what he eats chronic constipation---occ uses laxative. GU:  Complains of nocturia and urinary hesitancy; denies erectile dysfunction and urinary frequency; stable nocturia. MS:  Denies joint pain and joint swelling; right shoulder fine while on the glucosamine. Derm:  Denies lesion(s) and rash. Neuro:  Denies headaches, numbness, tingling, and weakness. Psych:  Complains of anxiety; denies depression; occ anxiety---esp when  palpitations come on at night xanax helps. Heme:  Denies abnormal bruising and enlarge lymph nodes. Allergy:  Complains of seasonal allergies and sneezing; mild symptoms---occ claritin.  Physical Exam  General:  alert and normal appearance.   Eyes:   pupils equal, pupils round, pupils reactive to light, and no optic disk abnormalities.   Ears:  R ear normal and L ear normal.   Mouth:  no erythema and no lesions.   Neck:  supple, no masses, no thyromegaly, no carotid bruits, and no cervical lymphadenopathy.   Lungs:  normal respiratory effort and normal breath sounds.   Heart:  normal rate, regular rhythm, no murmur, and no gallop.   Abdomen:  soft and non-tender.   Msk:  no joint tenderness and no joint swelling.   Pulses:  1+ in feet Extremities:  no edema Neurologic:  alert & oriented X3, strength normal in all extremities, and gait normal.   Skin:  no rashes and no suspicious lesions.   Axillary Nodes:  No palpable lymphadenopathy Psych:  normally interactive, good eye contact, not anxious appearing, and not depressed appearing.     Impression & Recommendations:  Problem # 1:  PREVENTIVE HEALTH CARE (ICD-V70.0) Assessment Comment Only up to date on imms discussed---no further PSA testing had colonoscopy a few years ago  Problem # 2:  ADJUSTMENT DISORDER WITH ANXIETY (ICD-309.24) Assessment: Improved has occ symptoms but generally okay  Problem # 3:  ATRIAL FIBRILLATION (ICD-427.31) Assessment: Comment Only having exacerbations didn't do well with dronedarone may be best to leave him along but then he will need the pradaxa has cardiology appts coming up  His updated medication list for this problem includes:    Diltiazem Hcl Er Beads 240 Mg Xr24h-cap (Diltiazem hcl er beads) .[name]... 1 tab by mouth daily    Aspirin Ec 325 Mg Tbec (Aspirin) .[name]... Take one tablet by mouth daily    Multaq 400 Mg Tabs (Dronedarone hcl) .[name]... 1 tab by mouth two times a day with meals  Problem # 4:  HYPERTROPHY PROSTATE W/UR OBST & OTH LUTS (ICD-600.01) Assessment: Unchanged mild symptoms prefers no meds for now  Complete Medication List: 1)  Pradaxa 150 Mg Caps (Dabigatran etexilate mesylate) .[name].. 1 tab by mouth twice daily 2)  Diltiazem  Hcl Er Beads 240 Mg Xr24h-cap (Diltiazem hcl er beads) .[name].. 1 tab by mouth daily 3)  Aspirin Ec 325 Mg Tbec (Aspirin) .... Take one tablet by mouth daily 4)  Ibuprofen 200 Mg Tabs (Ibuprofen) .... As needed 5)  Glucosamine 500 Mg Caps (Glucosamine sulfate) .... Once daily 6)  Alprazolam 0.25 Mg Tabs (Alprazolam) .[name].. 1 by mouth two times a day as needed for anxiety spells 7)  Multaq 400 Mg Tabs (Dronedarone hcl) .[name].. 1 tab by mouth two times a day with meals 8)  Red Yeast Rice Powd (Red yeast rice extract) .... As directed  Patient Instructions: 1)  Please schedule a follow-up appointment in 1 year.  Prescriptions: ALPRAZOLAM 0.25 MG TABS (ALPRAZOLAM) 1 by mouth two times a day as needed for anxiety spells  #30 x 1   Entered and Authorized by:   Cindee Salt MD   Signed by:   Cindee Salt MD on 01/16/2010   Method used:   Print then Give to Patient   RxID:   [id number]   Current Allergies (reviewed today): No known allergies      Colonoscopy  Procedure date:  10/08/2005  Findings:      No polyps

## 2010-11-07 NOTE — Progress Notes (Signed)
Summary: dose he need ablation  Phone Note Call from Patient Call back at Home Phone [phone]   Caller: Patient Reason for Call: Talk to Nurse, Talk to Doctor Summary of Call: pt canceled appt with Dr. Johney Frame because he thought he no longer needed the ablation because the meds where working and he wants to talk to someone to make sure what is going on Initial call taken by: Omer Jack,  Feb 28, 2010 8:12 AM  Follow-up for Phone Call           Per Dr Alford Highland OV note 01/20/10: He failed dronedarone and has been started on flecainide.  He had taken flecainide less than two days when he felt another run of atrial fib for a few hours.  I want him to take flecainide more consistently for a few weeks before giving up on it.  He will take 50 mg two times a day.  We will call him in 2 wks to see how he is doing.  If he is having symptomatic breakthrough, I will go ahead and have him see Dr. Johney Frame for atrial fibrillation ablation consideration.  Called spoke with pt's wife.  She states pt is doing well on Fleccanide, and saw Dr Ladona Ridgel a few weeks ago and was cleared x 6 mos.  Pt is going to call and cancel appt with Dr Johney Frame since he is doing very well at the present.   Follow-up by: Cloyde Reams RN,  Feb 28, 2010 3:18 PM

## 2010-11-07 NOTE — Assessment & Plan Note (Signed)
Summary: rov/per Tresa Endo   Primary Provider:  Cindee Salt MD   History of Present Illness: Mr. Kardell returns today for followup.  I had seen him previously for symptomatic tachybrady syndrome.  He has had a PPM recommended as we are unable to treat his atrial fibrillation because of concommitant bradycardia.  He had thought that placing his PPM would cure his atrial fibrillation and desired to speak to me again about his constellation of problems.  He notes continued symptomatic PAF.  No frank syncope though he is occaisionally light headed.  Current Medications (verified): 1)  Amlodipine Besylate 10 Mg Tabs (Amlodipine Besylate) .... Take 1 By Mouth Once Daily 2)  Aspirin Ec 325 Mg Tbec (Aspirin) .... Take One Tablet By Mouth Daily 3)  Ibuprofen 200 Mg Tabs (Ibuprofen) .... As Needed  Allergies (verified): No Known Drug Allergies  Past History:  Past Medical History: Last updated: 10/06/2009 1. Hyperlipidemia 2. Hypertension 3. Osteoarthritis 4. BPH 5. Allergic rhinitis 6. Atrial fibrillation: First noted in 12/10.  Has been paroxysmal.  CHADSVASC score = 2 but patient does not want coumadin.  Echo (12/10): Mild LVH, EF 55%, no regional wall motion abnormalities, moderate diastolic dysfunction, mild MR, mild biatrial enlargement, normal RV.  7.  Sinus bradycardia: Has limited rate control meds for atrial fibrillation.  8.  Chest pain: Lexiscan myoview (12/10) with EF 55%, no ischemia or infarction.    Past Surgical History: Last updated: 07/12/2009 Vasectomy in his 30's  Review of Systems  The patient denies chest pain, syncope, dyspnea on exertion, and peripheral edema.    Vital Signs:  Patient profile:   77 year old male Height:      71 inches Weight:      179 pounds BMI:     25.06 Pulse rate:   47 / minute BP sitting:   130 / 80  (right arm)  Vitals Entered By: Laurance Flatten CMA (October 27, 2009 10:50 AM)  Physical Exam  General:  General: Well developed,  well nourished, NAD Musculoskeletal: Muscle strength 5/5 all ext Psychiatric: Mood and affect normal Neck: No JVD, no carotid bruits, no thyromegaly, no lymphadenopathy. Lungs:Clear bilaterally, no wheezes, rhonci, crackles CV: Bradycardic, No murmurs, gallops rubs Abdomen: soft, NT, ND, BS present Extremities: No edema, pulses 2+.    EKG  Procedure date:  10/27/2009  Findings:      Sinus bradycardia with rate of:  47.Left axis deviation.    Impression & Recommendations:  Problem # 1:  SINOATRIAL NODE DYSFUNCTION (ICD-427.81) I have again recommended PPM and have discussed the indications for the device.  He understands and wishes to proceed. The following medications were removed from the medication list:    Warfarin Sodium 5 Mg Tabs (Warfarin sodium) ..... Use as directed by anticoagulation clinic His updated medication list for this problem includes:    Amlodipine Besylate 10 Mg Tabs (Amlodipine besylate) .[name]... Take 1 by mouth once daily    Aspirin Ec 325 Mg Tbec (Aspirin) .[name]... Take one tablet by mouth daily  Problem # 2:  ATRIAL FIBRILLATION (ICD-427.31) He remains symptomatic.  I note that he was intolerant to Warfarin initially and because he is currently CHADS score of one (he is not yet 77) then my plan would be to continue ASA and start Pradaxa when he turns 77. The following medications were removed from the medication list:    Warfarin Sodium 5 Mg Tabs (Warfarin sodium) ..... Use as directed by anticoagulation clinic His updated medication list for  this problem includes:    Aspirin Ec 325 Mg Tbec (Aspirin) .[name]... Take one tablet by mouth daily  Orders: EKG w/ Interpretation (93000)

## 2010-11-07 NOTE — Assessment & Plan Note (Signed)
Summary: 8:15  SWOLLEN R SHOULDER/CLE   Vital Signs:  Patient profile:   77 year old male Weight:      174 pounds Temp:     97.9 degrees F oral Pulse rate:   76 / minute Pulse rhythm:   regular BP sitting:   156 / 70  (left arm) Cuff size:   large  Vitals Entered By: Mervin Hack CMA Duncan Dull) (July 11, 2010 8:20 AM) CC: shoulder pain   History of Present Illness: Has developed some swelling in the back of his right shoulder Feels like there is fluid in there started last week No new strain or reinjury  Injection 3 months ago didn't help at all  Not taking any meds for this Pain is not bad---no change from baseline He does get a sharp pain if he reaches across his body  Able to golf and lift with it can paint and do other manual work with it  Allergies: No Known Drug Allergies  Past History:  Past medical, surgical, family and social histories (including risk factors) reviewed for relevance to current acute and chronic problems.  Past Medical History: Reviewed history from 01/20/2010 and no changes required. 1. Hyperlipidemia 2. Hypertension 3. Osteoarthritis 4. BPH 5. Allergic rhinitis 6. Atrial fibrillation: First noted in 12/10.  Has been paroxysmal.  CHADSVASC score = 2 but patient has not tolerated coumdin due to headache.  Echo (12/10): Mild LVH, EF 55%, no regional wall motion abnormalities, moderate diastolic dysfunction, mild MR, mild biatrial enlargement, normal RV.  Patient is very symptomatic with palpitations when he goes into atrial fibrillation.  Dronedarone was tried but he had breakthrough.  By [facility] interrogation, episodes of afib decreased in frequency and duration on dronedarone but still occurred.  Flecainide is now being tried.   7.  Sick sinus syndrome: Status post dual chamber Medtronic PCM.  8.  Chest pain: Lexiscan myoview (12/10) with EF 55%, no ischemia or infarction.    Past Surgical History: Reviewed history from 11/25/2009 and no  changes required. Vasectomy in his 86's Dual chamber pacemaker 1/11 --Dr Ladona Ridgel  Family History: Reviewed history from 07/12/2009 and no changes required. Mom died young from lung disease. Was smoker. Had HTN Dad died young of alcohol complications 1 brother, 2 sisters--general good health No CAD DM in great aunt No prostate or colon cancer  Social History: Reviewed history from 01/20/2010 and no changes required. 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 Has requested wife to make health care decisions for him as needed. Would accept CPR. Not sure about feeding tube.  Review of Systems       feels well No cough No SOB  Physical Exam  General:  alert and normal appearance.   Msk:  fairly normal ROM of right shoulder with crepitus no effusion or joint findings Skin:  movable subcutaneous mass overly lateral right scapula  ~6x6cm firm but non tender mass   Impression & Recommendations:  Problem # 1:  LIPOMA (ICD-214.9) Assessment New fairly large size but benign characteristics will plan surgical eval if it acts in suspicious way--like with rapid growth  Complete Medication List: 1)  Diltiazem Hcl Er Beads 240 Mg Xr24h-cap (Diltiazem hcl er beads) .[name].. 1 tab by mouth daily 2)  Alprazolam 0.25 Mg Tabs (Alprazolam) .[name].. 1 by mouth two times a day as needed for anxiety spells 3)  Flecainide Acetate 50 Mg Tabs (  Flecainide acetate) .[name].. 1 tablet by mouth two times a day 4)  Miralax Powd (Polyethylene glycol 3350) .[name].. 1 capful mixed with water daily to prevent constipation 5)  Aspirin Ec 325 Mg Tbec (Aspirin) .... Take one tablet by mouth daily 6)  Ibuprofen 200 Mg Tabs (Ibuprofen) .... 2-3 tabs two times a day as needed for shoulder pain 7)  Glucosamine 500 Mg Caps (Glucosamine sulfate) .... Once daily  Patient Instructions: 1)  Please call if lump on back rapidly  enlargens 2)  Please schedule a follow-up appointment in 6 months for physical  Current Allergies (reviewed today): No known allergies   Appended Document: 8:15  SWOLLEN R SHOULDER/CLE    Clinical Lists Changes  Orders: Added new Service order of Flu Vaccine 36yrs + MEDICARE PATIENTS ([id number]) - Signed Added new Service order of Administration Flu vaccine - MCR ([id number]) - Signed Observations: Added new observation of FLU VAX#1VIS: 05/02/10 version given July 11, 2010. (07/11/2010 8:39) Added new observation of FLU VAXLOT: [id number] (07/11/2010 8:39) Added new observation of FLU VAX EXP: 04/07/2011 (07/11/2010 8:39) Added new observation of FLU VAXBY: DeShannon Smith CMA (AAMA) (07/11/2010 8:39) Added new observation of FLU VAXRTE: IM (07/11/2010 8:39) Added new observation of FLU VAX DSE: 0.5 ml (07/11/2010 8:39) Added new observation of FLU VAXMFR: [employer] (07/11/2010 8:39) Added new observation of FLU VAX SITE: left deltoid (07/11/2010 8:39) Added new observation of FLU VAX: Fluvax MCR (07/11/2010 8:39)       Influenza Vaccine    Vaccine Type: Fluvax MCR    Site: left deltoid    Mfr: Gl[employer]oSmithKline    Dose: 0.5 ml    Route: IM    Given by: Mervin Hack CMA (AAMA)    Exp. Date: 04/07/2011    Lot #: [id number]    VIS given: 05/02/10 version given July 11, 2010.  Flu Vaccine Consent Questions    Do you have a history of severe allergic reactions to this vaccine? no    Any prior history of allergic reactions to egg and/or gelatin? no    Do you have a sensitivity to the preservative Thimersol? no    Do you have a past history of Guillan-Barre Syndrome? no    Do you currently have an acute febrile illness? no    Have you ever had a severe reaction to latex? no    Vaccine information given and explained to patient? yes

## 2010-11-07 NOTE — Assessment & Plan Note (Signed)
Summary: R SHOULDER PAIN/CLE   Vital Signs:  Patient profile:   77 year old male Height:      71 inches Weight:      173.4 pounds BMI:     24.27 Temp:     97.8 degrees F oral Pulse rate:   64 / minute Pulse rhythm:   regular BP sitting:   124 / 86  (left arm) Cuff size:   large  Vitals Entered By: Benny Lennert CMA Duncan Dull) (April 17, 2010 10:58 AM)  History of Present Illness: Chief complaint Right shoulder pain  77 year old male:  R AC joint: Years ago had some shoulder problems, did a lot of concrete work. Glucosamine, playing golf.  18 holes a week -   does not bother him when he is sitting still  pleasant elderly right-hand dominant gentleman who presents with right-sided shoulder pain that has been ongoing now for several months. He is now golfer and  recently started 0.4-5 days of golf a week, 18 holes. He doesn't have a specific instance of trauma, dislocation, but he was highly active when he would younger, use 2  worked as a Corporate investment banker, and the concrete all the time.  He has not had any radiculopathy, but he does have some crepitus in the shoulder.  Primarily stabbing pain deep,, he is not having any pain with abduction or reaching around to his back.  His systems: No radiculopathy, no paresthesias.  Recently did have a pacemaker and has some atrial fibrillation, but this is done well since pacemaker he is having not  any cardiac symptoms.  Allergies (verified): No Known Drug Allergies  Past History:  Past medical, surgical, family and social histories (including risk factors) reviewed, and no changes noted (except as noted below).  Past Medical History: Reviewed history from 01/20/2010 and no changes required. 1. Hyperlipidemia 2. Hypertension 3. Osteoarthritis 4. BPH 5. Allergic rhinitis 6. Atrial fibrillation: First noted in 12/10.  Has been paroxysmal.  CHADSVASC score = 2 but patient has not tolerated coumdin due to headache.  Echo (12/10): Mild  LVH, EF 55%, no regional wall motion abnormalities, moderate diastolic dysfunction, mild MR, mild biatrial enlargement, normal RV.  Patient is very symptomatic with palpitations when he goes into atrial fibrillation.  Dronedarone was tried but he had breakthrough.  By [facility] interrogation, episodes of afib decreased in frequency and duration on dronedarone but still occurred.  Flecainide is now being tried.   7.  Sick sinus syndrome: Status post dual chamber Medtronic PCM.  8.  Chest pain: Lexiscan myoview (12/10) with EF 55%, no ischemia or infarction.    Past Surgical History: Reviewed history from 11/25/2009 and no changes required. Vasectomy in his 36's Dual chamber pacemaker 1/11 --Dr Ladona Ridgel   Family History: Reviewed history from 07/12/2009 and no changes required. Mom died young from lung disease. Was smoker. Had HTN Dad died young of alcohol complications 1 brother, 2 sisters--general good health No CAD DM in great aunt No prostate or colon cancer  Social History: Reviewed history from 01/20/2010 and no changes required. 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 Has requested wife to make health care decisions for him as needed. Would accept CPR. Not sure about feeding tube.  Physical Exam  General:  GEN: Well-developed,well-nourished,in no acute distress; alert,appropriate and cooperative throughout examination HEENT: Normocephalic and atraumatic without obvious abnormalities. No apparent alopecia or balding. Ears,  externally no deformities PULM: Breathing comfortably in no respiratory distress EXT: No clubbing, cyanosis, or edema PSYCH: Normally interactive. Cooperative during the interview. Pleasant. Friendly and conversant. Not anxious or depressed appearing. Normal, full affect.  Msk:  Shoulder: R Inspection: No muscle wasting or winging Ecchymosis/edema: neg  AC  joint, scapula, clavicle: TTP Cervical spine: NT, full ROM Spurling's: neg Abduction: full, 5/5 Flexion: full, 5/5 IR, full, lift-off: 5/5 ER at neutral: full, 5/5 AC crossover and compression: POS R Neer: neg Hawkins: neg Drop Test: neg Empty Can: neg Supraspinatus insertion: NT Bicipital groove: NT Speed's: neg Yergason's: neg Sulcus sign: neg Scapular dyskinesis: none C5-T1 intact Sensation intact Grip 5/5    Impression & Recommendations:  Problem # 1:  ARTHRITIS, RIGHT SHOULDER (ICD-716.91) think this is the case it is almost purely acromioclavicular  arthritic  exacerbation. Patient does have some glenohumeral arthritis and crepitus with motion at the glenohumeral joint, and  but this doesn't really cause any pain.  Primarily all of the a.c., and could not make his  rotator cuff symptomatic.  Continue with rare ibuprofen, icing after golf, and some tramadol  if it is bad.  Think it is reasonable to try call him down with a direct a.c. injection as well.  AC Joint Injection Verbal consent was obtained from the patient. Risks, benefits, and alternatives have been reviewed. The patient was prepped with Betadine. Ethyl Chloride used for anesthesia. Under sterile conditions, 1/2 cc of Marcaine 0.5% and 1/2 cc of Kenalog 40 mg directly injected into at the superior-lateral AC joint. The patient tolerated the procedure well and had decreased symptoms after injection. No complications.    Orders: Prescription Created Electronically [phone]) Joint Aspirate / Injection, Large (20610) Kenalog 10mg  (4units) (J3301)  Complete Medication List: 1)  Diltiazem Hcl Er Beads 240 Mg Xr24h-cap (Diltiazem hcl er beads) .[name].. 1 tab by mouth daily 2)  Aspirin Ec 325 Mg Tbec (Aspirin) .... Take one tablet by mouth daily 3)  Ibuprofen 200 Mg Tabs (Ibuprofen) .... As needed 4)  Glucosamine 500 Mg Caps (Glucosamine sulfate) .... Once daily 5)  Alprazolam 0.25 Mg Tabs (Alprazolam) .[name].. 1 by mouth  two times a day as needed for anxiety spells 6)  Flecainide Acetate 50 Mg Tabs (Flecainide acetate) .[name].. 1 tablet by mouth two times a day 7)  Tramadol Hcl 50 Mg Tabs (Tramadol hcl) .[name].. 1 by mouth 4 times daily as needed pain Prescriptions: TRAMADOL HCL 50 MG TABS (TRAMADOL HCL) 1 by mouth 4 times daily as needed pain  #40 x 1   Entered and Authorized by:   Hannah Beat MD   Signed by:   Hannah Beat MD on 04/17/2010   Method used:   Electronically to        [facility] [address] [address]* (retail)       [address]       [place], [place]  [address]       Ph: [phone]       Fax: [phone]   RxID:   [id number]   Current Allergies (reviewed today): No known allergies

## 2010-11-07 NOTE — Progress Notes (Signed)
Summary: pradaxa  Phone Note Outgoing Call   Summary of Call: pradaxa approved through [employer].  Left message to call.  Initial call taken by: Charlena Cross, RN, BSN,  December 28, 2009 9:48 AM    New/Updated Medications: PRADAXA 150 MG CAPS (DABIGATRAN ETEXILATE MESYLATE) 1 tab by mouth twice daily Prescriptions: PRADAXA 150 MG CAPS (DABIGATRAN ETEXILATE MESYLATE) 1 tab by mouth twice daily  #60 x 6   Entered by:   Charlena Cross, RN, BSN   Authorized by:   Marca Ancona, MD   Signed by:   Charlena Cross, RN, BSN on 12/28/2009   Method used:   Electronically to        [facility] [address] (retail)       [address]       [place], [place]  [address]       Ph: [phone]       Fax: [phone]   RxID:   [id number]

## 2010-11-07 NOTE — Procedures (Signed)
Summary: PC2   Primary Provider:  Cindee Salt MD   History of Present Illness: Mr. [name] returns today for followup of atrial fibrillation.  He underwent PPM several months ago for symptomatic tachy-brady syndrome.  Since then he has tried several anti-arrhythmic meds including Dronenderone and low dose flecainide.  He had also been intolerant on coumadin and was switched to Pradaxa.  Over the last 3 weeks he has been better. He has had some trouble with his flecainide - it makes his heart slow down and feel like he is pacing all of the time.  Current Medications (verified): 1)  Pradaxa 150 Mg Caps (Dabigatran Etexilate Mesylate) .[name].. 1 Tab By Mouth Twice Daily (Not Started Yet) 2)  Diltiazem Hcl Er Beads 240 Mg Xr24h-Cap (Diltiazem Hcl Er Beads) .[name].. 1 Tab By Mouth Daily 3)  Aspirin Ec 325 Mg Tbec (Aspirin) .... Take One Tablet By Mouth Daily 4)  Ibuprofen 200 Mg Tabs (Ibuprofen) .... As Needed 5)  Glucosamine 500 Mg Caps (Glucosamine Sulfate) .... Once Daily 6)  Alprazolam 0.25 Mg Tabs (Alprazolam) .[name].. 1 By Mouth Two Times A Day As Needed For Anxiety Spells 7)  Flecainide Acetate 50 Mg Tabs (Flecainide Acetate) .... 3/4 Tablet By Mouth Two Times A Day  Allergies (verified): No Known Drug Allergies  Past History:  Past Medical History: Last updated: 01/20/2010 1. Hyperlipidemia 2. Hypertension 3. Osteoarthritis 4. BPH 5. Allergic rhinitis 6. Atrial fibrillation: First noted in 12/10.  Has been paroxysmal.  CHADSVASC score = 2 but patient has not tolerated coumdin due to headache.  Echo (12/10): Mild LVH, EF 55%, no regional wall motion abnormalities, moderate diastolic dysfunction, mild MR, mild biatrial enlargement, normal RV.  Patient is very symptomatic with palpitations when he goes into atrial fibrillation.  Dronedarone was tried but he had breakthrough.  By [facility] interrogation, episodes of afib decreased in frequency and duration on dronedarone but still occurred.   Flecainide is now being tried.   7.  Sick sinus syndrome: Status post dual chamber Medtronic PCM.  8.  Chest pain: Lexiscan myoview (12/10) with EF 55%, no ischemia or infarction.    Past Surgical History: Last updated: 11/25/2009 Vasectomy in his 25's Dual chamber pacemaker 1/11 --Dr Ladona Ridgel  Review of Systems  The patient denies chest pain, syncope, dyspnea on exertion, and peripheral edema.    Vital Signs:  Patient profile:   77 year old male Pulse rate:   63 / minute BP supine:   124 / 76  Physical Exam  General:  alert and normal appearance.   Head:  normocephalic and atraumatic Eyes:  pupils equal, pupils round, pupils reactive to light, and no optic disk abnormalities.   Mouth:  no erythema and no lesions.   Neck:  Neck supple, no JVD. No masses, thyromegaly or abnormal cervical nodes. Chest Wall:  Well healed PPM incision. Lungs:  normal respiratory effort and normal breath sounds.   Heart:  RRR with normal S1 and S2. PMI is not enlarged or lalterally displaced.  Abdomen:  Bowel sounds positive; abdomen soft and non-tender without masses, organomegaly, or hernias noted. No hepatosplenomegaly. Msk:  no joint tenderness and no joint swelling.   Pulses:  1+ in feet Extremities:  No clubbing or cyanosis. Neurologic:  Alert and oriented x 3.   PPM Specifications Following MD:  [name], MD     Referring MD:  Jacklynn Barnacle Vendor:  Medtronic     PPM Model Number:  ADDRL1     PPM  Serial Number:  [id number] PPM DOI:  11/03/2009     PPM Implanting MD:  [name], MD  Lead 1    Location: RA     DOI: 11/03/2009     Model #: 4098     Serial #: [id number]     Status: active Lead 2    Location: RV     DOI: 11/03/2009     Model #: 6213     Serial #: [id number]     Status: active  Magnet Response Rate:  BOL 85 ERI  65  Indications:  Sinus Brady/Afib   PPM Follow Up Remote Check?  No Battery Voltage:  2.80 V     Battery Est. Longevity:  93yrs     Pacer Dependent:  No        PPM Device Measurements Atrium  Amplitude: 5.60 mV, Impedance: 546 ohms, Threshold: 0.50 V at 0.40 msec Right Ventricle  Amplitude: 8.00 mV, Impedance: 671 ohms, Threshold: 0.50 V at 0.40 msec  Episodes MS Episodes:  110     Percent Mode Switch:  2.4%     Coumadin:  No Ventricular High Rate:  0     Atrial Pacing:  96.7%     Ventricular Pacing:  0.3%  Parameters Mode:  MVP(R)     Lower Rate Limit:  60     Upper Rate Limit:  130 Paced AV Delay:  150     Sensed AV Delay:  120 Rate Response Parameters:  ADL Response-3, Exertion Response-3, Threshold-Med/low, Acceleration-30 seconds, Deceleration-Exercise Tech Comments:  PT IN MODE SWITCH 2.4% OF TIME.  NORMAL DEVICE FUNCTION W/NO CHANGES MADE.  ROV IN 1/12 W/DR [facility] [place].  Vella Kohler MD Comments:  Agree with above.  Impression & Recommendations:  Problem # 1:  ATRIAL FIBRILLATION (ICD-427.31) He is maintaining NSR 98% of the time. Continu current dose of flecainide. His updated medication list for this problem includes:    Aspirin Ec 325 Mg Tbec (Aspirin) .[name]... Take one tablet by mouth daily    Flecainide Acetate 50 Mg Tabs (Flecainide acetate) .[name]... 3/4 tablet by mouth two times a day  Problem # 2:  CARDIAC PACEMAKER IN SITU (ICD-V45.01) His device is working normally.  Will recheck in several months.  Problem # 3:  HYPERTENSION (ICD-401.9) His blood pressure is fairly well controlled. His updated medication list for this problem includes:    Diltiazem Hcl Er Beads 240 Mg Xr24h-cap (Diltiazem hcl er beads) .[name]... 1 tab by mouth daily    Aspirin Ec 325 Mg Tbec (Aspirin) .[name]... Take one tablet by mouth daily  Patient Instructions: 1)  Your physician recommends that you schedule a follow-up appointment in: 6 months with Dr Ladona Ridgel.

## 2010-11-07 NOTE — Miscellaneous (Signed)
Summary: device preload  Clinical Lists Changes  Observations: Added new observation of PPM INDICATN: Sinus Brady/Afib (11/07/2009 9:48) Added new observation of PPMLEADSTAT2: active (11/07/2009 9:48) Added new observation of PPMLEADSER2: [id number] (11/07/2009 9:48) Added new observation of PPMLEADMOD2: 5076  (11/07/2009 9:48) Added new observation of PPMLEADDOI2: 11/03/2009  (11/07/2009 9:48) Added new observation of PPMLEADLOC2: RV  (11/07/2009 9:48) Added new observation of PPMLEADSTAT1: active  (11/07/2009 9:48) Added new observation of PPMLEADSER1: [id number]  (11/07/2009 9:48) Added new observation of PPMLEADMOD1: 5076  (11/07/2009 9:48) Added new observation of PPMLEADDOI1: 11/03/2009  (11/07/2009 9:48) Added new observation of PPMLEADLOC1: RA  (11/07/2009 9:48) Added new observation of PPM IMP MD: Juan Bunting, MD  (11/07/2009 9:48) Added new observation of PPM DOI: 11/03/2009  (11/07/2009 9:48) Added new observation of PPM SERL#: [id number]  (11/07/2009 9:48) Added new observation of PPM MODL#: ADDRL1  (11/07/2009 6:57) Added new observation of PACEMAKERMFG: Medtronic  (11/07/2009 9:48) Added new observation of PACEMAKER MD: Juan Bunting, MD  (11/07/2009 9:48)      PPM Specifications Following MD:  Juan Bunting, MD     PPM Vendor:  Medtronic     PPM Model Number:  ADDRL1     PPM Serial Number:  [id number] H PPM DOI:  11/03/2009     PPM Implanting MD:  Juan Bunting, MD  Lead 1    Location: RA     DOI: 11/03/2009     Model #: 8413     Serial #: [id number]     Status: active Lead 2    Location: RV     DOI: 11/03/2009     Model #: 3664     Serial #: [id number]     Status: active   Indications:  Sinus Brady/Afib

## 2010-11-07 NOTE — Assessment & Plan Note (Signed)
Summary: F2M/AMD   Visit Type:  Follow-up Primary Provider:  Cindee Salt MD  CC:  palpitations.  History of Present Illness: 77 yo with history of symptomatic paroxysmal atrial fibrillation and sick sinus syndrome requiring pacemaker placement returns for followup.  Since I last saw him, [name] had a dual chamber Medtronic pacemaker placed.  He is doing well in general but continues to have periodic episodes where he feels his heart racing.  He suspects these episodes are atrial fibrillation.  He has significant anxiety when he feels palpitations but no chest pain or dyspnea. [name] He continues to be active with no exertional dyspnea.  He is playing some golf.  He is taking aspirin now.  He was on coumadin briefly but had severe headaches and had to stop it, with resolution of headaches.    ECG: a-paced, v-sensed, QTc 410 msec  Labs (2/11): K 4.3, creatinine 1.03, LDL 169, HDL 56  Current Problems (verified): 1)  Adjustment Disorder With Anxiety  (ICD-309.24) 2)  Sinoatrial Node Dysfunction  (ICD-427.81) 3)  Bradycardia  (ICD-427.89) 4)  Atrial Fibrillation  (ICD-427.31) 5)  Hepatic Cyst  (ICD-573.8) 6)  Echocardiogram, Abnormal  (ICD-793.2) 7)  Chest Pain  (ICD-786.50) 8)  Allergic Rhinitis  (ICD-477.9) 9)  Hypertrophy Prostate W/ur Obst & Oth Luts  (ICD-600.01) 10)  Osteoarthritis  (ICD-715.90) 11)  Hypertension  (ICD-401.9) 12)  Hyperlipidemia  (ICD-272.4)  Current Medications (verified): 1)  Amlodipine Besylate 10 Mg Tabs (Amlodipine Besylate) .... Take 1 By Mouth Once Daily 2)  Aspirin Ec 325 Mg Tbec (Aspirin) .... Take One Tablet By Mouth Daily 3)  Ibuprofen 200 Mg Tabs (Ibuprofen) .... As Needed 4)  Glucosamine 500 Mg Caps (Glucosamine Sulfate) .... Once Daily 5)  Alprazolam 0.25 Mg Tabs (Alprazolam) .[name].. 1 By Mouth Two Times A Day As Needed For Anxiety Spells  Allergies (verified): No Known Drug Allergies  Past History:  Past Surgical History: Last updated:  11/25/2009 Vasectomy in his 30's Dual chamber pacemaker 1/11 --Dr Ladona Ridgel  Family History: Last updated: Aug 09, 2009 Mom died young from lung disease. Was smoker. 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 Has requested wife to make health care decisions for him as needed. Would accept CPR. Not sure about feeding tube.  Risk Factors: Smoking Status: quit (August 09, 2009)  Past Medical History: 1. Hyperlipidemia 2. Hypertension 3. Osteoarthritis 4. BPH 5. Allergic rhinitis 6. Atrial fibrillation: First noted in 12/10.  Has been paroxysmal.  CHADSVASC score = 2 but patient has not tolerated coumdin due to headache.  Echo (12/10): Mild LVH, EF 55%, no regional wall motion abnormalities, moderate diastolic dysfunction, mild MR, mild biatrial enlargement, normal RV.  7.  Sick sinus syndrome: Status post dual chamber Medtronic PCM.  8.  Chest pain: Lexiscan myoview (12/10) with EF 55%, no ischemia or infarction.    Family History: Reviewed history from 08-09-2009 and no changes required. Mom died young from lung disease. Was smoker. Had HTN Dad died young of alcohol complications 1 brother, 2 sisters--general good health No CAD DM in great aunt No prostate or colon cancer  Social History: Reviewed history from 08-09-2009 and no changes required. 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 Has requested wife to make health care decisions for him as needed. Would accept CPR. Not sure about feeding tube.  Review of Systems       All systems reviewed and negative except as per HPI.   Vital  Signs:  Patient profile:   77 year old male Height:      71 inches Weight:      183.50 pounds BMI:     25.69 Pulse rate:   84 / minute Pulse rhythm:   regular BP sitting:   130 / 76  (left arm) Cuff size:   large  Vitals Entered By: [name] (December 21, 2009 10:22 AM)  Physical Exam  General:  alert and normal appearance.   Neck:  Neck supple, no JVD. No masses, thyromegaly or abnormal cervical nodes. Lungs:  Clear bilaterally to auscultation and percussion. Heart:  Non-displaced PMI, chest non-tender; regular rate and rhythm, S1, S2 without murmurs, rubs or gallops. Carotid upstroke normal, no bruit.  Pedals normal pulses. No edema, no varicosities. Abdomen:  Bowel sounds positive; abdomen soft and non-tender without masses, organomegaly, or hernias noted. No hepatosplenomegaly. Extremities:  No clubbing or cyanosis. Neurologic:  Alert and oriented x 3. Psych:  Normal affect.   PPM Specifications Following MD:  [name], MD     PPM Vendor:  Medtronic     PPM Model Number:  ADDRL1     PPM Serial Number:  [id number] PPM DOI:  11/03/2009     PPM Implanting MD:  [name], MD  Lead 1    Location: RA     DOI: 11/03/2009     Model #: 4098     Serial #: [id number]     Status: active Lead 2    Location: RV     DOI: 11/03/2009     Model #: 6213     Serial #: [id number]     Status: active  Magnet Response Rate:  BOL 85 ERI  65  Indications:  Sinus Brady/Afib   PPM Follow Up Pacer Dependent:  No      Episodes Coumadin:  No  Parameters Mode:  MVP(R)     Lower Rate Limit:  60     Upper Rate Limit:  130 Paced AV Delay:  150     Sensed AV Delay:  120 Rate Response Parameters:  ADL Response-3, Exertion Response-3, Threshold-Med/low, Acceleration-30 seconds, Deceleration-Exercise  Impression & Recommendations:  Problem # 1:  SINOATRIAL NODE DYSFUNCTION (ICD-427.81) Now has pacemaker.  Followed by Dr. Ladona Ridgel.   Problem # 2:  ATRIAL FIBRILLATION  (ICD-427.31) Assessment: Unchanged Quite symptomatic from palpitations when he has atrial fibrillation runs.  No CHF history.  I will start him on dronedarone 400 mg two times a day to be taken with food to try to maintain NSR.  If this fails, he can try flecainide with a nodal blocking agent (as his myoview was normal).  He has a CHADSVASC score of 2.  He did not tolerate coumadin due to headache.  I am going to start him on Pradaxa 150 mg two times a day. We will talk to his insurance to see if we can get his copay down as he cannot take coumadin.  OK to stop aspirin when he goes on Pradaxa.   Problem # 3:  HYPERLIPIDEMIA (ICD-272.4) LDL is high, patient refuses statin.  He wants to try diet and exercise.  I will also have him take red yeast  rice extract.  Repeat lipids/LFTs in 3 months.    Problem # 4:  HYPERTENSION (ICD-401.9) BP under good control.   Patient Instructions: 1)  Your physician recommends that you schedule a follow-up appointment in: 2 weeks for EKG, 3 months for blood work then follow up with Shirlee Latch 2)  Your physician recommends that you return for a FASTING lipid profile: (lipids/lfts) 3 months 3)  Your physician has recommended you make the following change in your medication: start multaq 400 mg twice a day WITH MEALS!!!!, once we have heard from your insurance company, you will start pradaxa 150 mg twice day and stop your aspirin, start red rice yeast  Prescriptions: MULTAQ 400 MG TABS (DRONEDARONE HCL) 1 tab by mouth two times a day with meals  #60 x 6   Entered by:   Charlena Cross, RN, BSN   Authorized by:   Marca Ancona, MD   Signed by:   Charlena Cross, RN, BSN on 12/21/2009   Method used:   Electronically to        [facility] (retail)       [address]       [place], [place]  [address]       Ph: [phone]       Fax: [phone]   RxID:   [id number]

## 2010-11-07 NOTE — Assessment & Plan Note (Signed)
Summary: / tacky bradycardia., afib/ pt has secure horionze./ o.k.per ...   Visit Type:  Follow-up Primary Provider:  Cindee Salt MD   History of Present Illness: [name] is referred today by Dr. Shirlee Latch for evaluation of symptomatic tachybrady syndrome.  The patient looks well and younger than his stated age.  He was hospitalized for atrial fibrillation with an RVR back in December and subsequently converted back to NSR.  He has fairly prominent bradycardia with this has had over 3 second pauses.  He has never had frank syncope but notes that his heart goes out of rhythm several times a week.  No other complaints today.  Current Medications (verified): 1)  Amlodipine Besylate 10 Mg Tabs (Amlodipine Besylate) .... Take 1 By Mouth Once Daily 2)  Aspirin 81 Mg Tbec (Aspirin) .... Take One Tablet By Mouth Daily 3)  Warfarin Sodium 5 Mg Tabs (Warfarin Sodium) .... Use As Directed By Anticoagulation Clinic  Allergies (verified): No Known Drug Allergies  Past History:  Past Medical History: Last updated: 10/06/2009 1. Hyperlipidemia 2. Hypertension 3. Osteoarthritis 4. BPH 5. Allergic rhinitis 6. Atrial fibrillation: First noted in 12/10.  Has been paroxysmal.  CHADSVASC score = 2 but patient does not want coumadin.  Echo (12/10): Mild LVH, EF 55%, no regional wall motion abnormalities, moderate diastolic dysfunction, mild MR, mild biatrial enlargement, normal RV.  7.  Sinus bradycardia: Has limited rate control meds for atrial fibrillation.  8.  Chest pain: Lexiscan myoview (12/10) with EF 55%, no ischemia or infarction.    Past Surgical History: Last updated: August 10, 2009 Vasectomy in his 33's  Family History: Last updated: 10-Aug-2009 Mom died young from lung disease. Was smoker. 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 Has requested wife to make health care decisions for him as needed. Would accept CPR. Not sure about feeding tube.  Review of Systems       All systems reviewed and negative except as noted in the HPI.  Vital Signs:  Patient profile:   77 year old male Height:      71 inches Weight:      183 pounds BMI:     25.62 Pulse rate:   56 / minute BP sitting:   152 / 84  (left arm)  Vitals Entered By: Laurance Flatten CMA (October 20, 2009 3:00 PM)  Physical Exam  General:  General: Well developed, well nourished, NAD Musculoskeletal: Muscle strength 5/5 all ext Psychiatric: Mood and affect normal Neck: No JVD, no carotid bruits, no thyromegaly, no lymphadenopathy. Lungs:Clear bilaterally, no wheezes, rhonci, crackles CV: Bradycardic, No murmurs, gallops rubs Abdomen: soft, NT, ND, BS present Extremities: No edema, pulses 2+.    Impression & Recommendations:  Problem # 1:  ATRIAL FIBRILLATION (ICD-427.31) He continues to have symptoms.  I have offered him two options.  One would be admission for Tikosyn, the second would be to proceed with PPM and then start flecainide or rhythmol.  He would like to proceed with PPM.  The risks/benefits/goals/and expectations of PPM has been discussed with the patient and he wishes to proceed. His updated medication list for this problem includes:    Aspirin 81 Mg Tbec (Aspirin) .[name]... Take one tablet by mouth daily  Warfarin Sodium 5 Mg Tabs (Warfarin sodium) ..... Use as directed by anticoagulation clinic  Problem # 2:  BRADYCARDIA (ICD-427.89) His has greater than 3 second pauses with out sinus nodal slowing drugs.  Will proceed with PPM. His updated medication list for this problem includes:    Amlodipine Besylate 10 Mg Tabs (Amlodipine besylate) .[name]... Take 1 by mouth once daily    Aspirin 81  Mg Tbec (Aspirin) .[name]... Take one tablet by mouth daily    Warfarin Sodium 5 Mg Tabs (Warfarin sodium) ..... Use as directed by anticoagulation clinic  Problem # 3:  HYPERTENSION (ICD-401.9) He will continue his current meds for now though these may be changed once his PPM is placed. His updated medication list for this problem includes:    Amlodipine Besylate 10 Mg Tabs (Amlodipine besylate) .[name]... Take 1 by mouth once daily    Aspirin 81 Mg Tbec (Aspirin) .[name]... Take one tablet by mouth daily

## 2010-11-07 NOTE — Assessment & Plan Note (Signed)
Summary: [name]   Primary Provider:  Cindee Salt MD  CC:  C/O having episode of Fib. yesterday.  History of Present Illness: 77 yo with history of symptomatic paroxysmal atrial fibrillation and sick sinus syndrome requiring pacemaker placement returns for followup.  He was on coumadin briefly but had severe headaches and had to stop it, with resolution of headaches.  Patient was started on dronedarone when I last saw him due to very symptomatic paroxysmal atrial fibrillation (palpitations).  He continued to have symptomatic breakthrough on dronedarone.  PCM interrogation showed that afib episodes were decreased in duration and frequency but were still present.  We stopped dronedarone and started him on flecainide (he is also on diltiazem CD).  He took flecainide for not quite 2 days then had an episode of irregular rhythm, presumably atrial fib, with rates ranging 70s-100s when he checked his heart rate at home.  He therefore stopped taking the flecainide. He gets very bothersome palpitations in atrial fibrillation.  Otherwise, no chest pain or exertional dyspnea.  He did not start Pradaxa because he "wants to see what kind of side effects that flecainide causes" before starting an additional medication.   BP is high at 158/82 but was 137/78 this am at home and has been running SBP 120s-140s at home.   ECG: a-paced, right axis deviation, otherwise normal  Labs (2/11): K 4.3, creatinine 1.03, LDL 169, HDL 56   Current Medications (verified): 1)  Pradaxa 150 Mg Caps (Dabigatran Etexilate Mesylate) .[name].. 1 Tab By Mouth Twice Daily (Not Started Yet) 2)  Diltiazem Hcl Er Beads 240 Mg Xr24h-Cap (Diltiazem Hcl Er Beads) .[name].. 1 Tab By Mouth Daily 3)  Aspirin Ec 325 Mg Tbec (Aspirin) .... Take One Tablet By Mouth Daily 4)  Ibuprofen 200 Mg Tabs (Ibuprofen) .... As Needed 5)  Glucosamine 500 Mg Caps (Glucosamine Sulfate) .... Once Daily 6)  Alprazolam 0.25 Mg Tabs (Alprazolam) .[name].. 1 By Mouth Two  Times A Day As Needed For Anxiety Spells 7)  Red Yeast Rice  Powd (Red Yeast Rice Extract) .... As Directed  Allergies (verified): No Known Drug Allergies  Past History:  Past Medical History: 1. Hyperlipidemia 2. Hypertension 3. Osteoarthritis 4. BPH 5. Allergic rhinitis 6. Atrial fibrillation: First noted in 12/10.  Has been paroxysmal.  CHADSVASC score = 2 but patient has not tolerated coumdin due to headache.  Echo (12/10): Mild LVH, EF 55%, no regional wall motion abnormalities, moderate diastolic dysfunction, mild MR, mild biatrial enlargement, normal RV.  Patient is very symptomatic with palpitations when he goes into atrial fibrillation.  Dronedarone was tried but he had breakthrough.  By [facility] interrogation, episodes of afib decreased in frequency and duration on dronedarone but still occurred.  Flecainide is now being tried.   7.  Sick sinus syndrome: Status post dual chamber Medtronic PCM.  8.  Chest pain: Lexiscan myoview (12/10) with EF 55%, no ischemia or infarction.    Family History: Reviewed history from 07/12/2009 and no changes required. Mom died young from lung disease. Was smoker. Had HTN Dad died young of alcohol complications 1 brother, 2 sisters--general good health No CAD DM in great aunt No prostate or colon cancer  Social History: Reviewed history from 07/12/2009 and no changes required. 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 Has requested wife to make health care decisions for him as needed. Would accept CPR. Not sure about feeding  tube.  Review of Systems       All systems reviewed and negative except as per HPI.   Vital Signs:  Patient profile:   77 year old male Height:      71 inches Weight:      181 pounds BMI:     25.34 Pulse rate:   64 / minute BP sitting:   158 / 82  (left arm)  Vitals Entered By: Stanton Kidney, EMT-P (January 20, 2010  9:06 AM)  Physical Exam  General:  alert and normal appearance.   Neck:  Neck supple, no JVD. No masses, thyromegaly or abnormal cervical nodes. Lungs:  normal respiratory effort and normal breath sounds.   Heart:   Non-displaced PMI, chest non-tender; regular rate and rhythm, S1, S2 without murmurs, rubs or gallops. Carotid upstroke normal, no bruit.  Pedals normal pulses. No edema, no varicosities. Abdomen:  Bowel sounds positive; abdomen soft and non-tender without masses, organomegaly, or hernias noted. No hepatosplenomegaly. Extremities:  No clubbing or cyanosis. Neurologic:  Alert and oriented x 3. Psych:  Normal affect.   PPM Specifications Following MD:  [name], MD     Referring MD:  Jacklynn Barnacle Vendor:  Medtronic     PPM Model Number:  [id number]     PPM Serial Number:  [id number] PPM DOI:  11/03/2009     PPM Implanting MD:  [name], MD  Lead 1    Location: RA     DOI: 11/03/2009     Model #: 9562     Serial #: [id number]     Status: active Lead 2    Location: RV     DOI: 11/03/2009     Model #: 9629     Serial #: [id number]     Status: active  Magnet Response Rate:  BOL 85 ERI  65  Indications:  Sinus Brady/Afib   PPM Follow Up Pacer Dependent:  No      Episodes Coumadin:  No  Parameters Mode:  MVP(R)     Lower Rate Limit:  60     Upper Rate Limit:  130 Paced AV Delay:  150     Sensed AV Delay:  120 Rate Response Parameters:  ADL Response-3, Exertion Response-3, Threshold-Med/low, Acceleration-30 seconds, Deceleration-Exercise  Impression & Recommendations:  Problem # 1:  ATRIAL FIBRILLATION (ICD-427.31) Symptomatic PAF.  No CHF.  No history of CAD.  Hypertension with mild LVH only on echo.  He failed dronedarone and has been started on flecainide.  He had taken flecainide less than two days when he felt another run of atrial fib for a few hours.  I want him to take flecainide more consistently for a few weeks before giving up on it.  He will take 50 mg two  times a day.  We will call him in 2 wks to see how he is doing.  If he is having symptomatic breakthrough, I will go ahead and have him see Dr. Johney Frame for atrial fibrillation ablation consideration.  He does not want to start Pradaxa today (did not tolerate coumadin side effects).  He wants to see if he has any side effects from flecainide and then start Pradaxa.  We will see about arranging for Pradaxa when we call him in 2 wks (CHADSVASC score 3 for age and HTN).  He is also taking diltiazem CD for rate control in atrial fib.   Problem # 2:  SINOATRIAL NODE DYSFUNCTION (ICD-427.81) PCM functioning appropriately when last checked.  Problem # 3:  HYPERTENSION (ICD-401.9) BP high today but has been doing well at home.  Will continue current dose of diltiazem CD.   Problem # 4:  HYPERLIPIDEMIA (ICD-272.4) Taking red yeast rice extract now.  LDL was 169 when checked last.  He is working on diet and exercise to try to lower cholesterol.  Will check lipids in 6/10.   Patient Instructions: 1)  Your physician recommends that you schedule a follow-up appointment in: June with Shirlee Latch, 1 month with Allred 2)  Your physician has recommended you make the following change in your medication: flecainide 50 mg twice a day, consider starting pradaxa in 2 weeks.  We will call you to follow up.  3)  Your physician recommends that you return for a FASTING lipid profile: in June before appt with Dr. Shirlee Latch (lipids. lfts)

## 2010-11-07 NOTE — Progress Notes (Signed)
Summary: MEDICATION   Phone Note Call from Patient Call back at Home Phone [phone]   Caller: SELF Call For: [facility] Summary of Call: PT STATES THAT THE MEDICATION TO CONTROL HIS HEART RATE IS NOT WORKING-GOES IN AND OUT OF AFIB SEVERAL TIMES A DAY Initial call taken by: Harlon Flor,  January 10, 2010 11:27 AM     Appended Document: MEDICATION  May have breakthrough atrial fib.  Would have him come in to get pacemaker checked to confirm ongoing atrial fibrillation, and if he is still having atrial fib would stop Multaq and start flecainide at 50 mg two times a day. He also should be taking diltiazem CD 240 mg daily and not amlodipine.  He can followup in clinic with me to go over this.   Appended Document: MEDICATION  pt aware of starting flecainide, stopping multaq.  has appt for ekg this week and f/u with Shafin Pollio in 3-4 weeks. Charlena Cross RN BSN

## 2010-11-09 NOTE — Progress Notes (Signed)
Summary: Called pt  Phone Note Outgoing Call Call back at [facility] Phone [phone]   Call placed by: Harlon Flor,  October 25, 2010 2:09 PM Call placed to: Patient Summary of Call: Called to schedule f/u w/ Shirlee Latch.  Pt was not home, left message w/ his wife for a call back.  Pt is overdue for appt by 7 months. Initial call taken by: Harlon Flor,  October 25, 2010 2:10 PM

## 2010-11-09 NOTE — Cardiovascular Report (Signed)
Summary: Office Visit   Office Visit   Imported By: Roderic Ovens 09/21/2010 12:12:13  _____________________________________________________________________  External Attachment:    Type:   Image     Comment:   External Document

## 2010-11-09 NOTE — Progress Notes (Signed)
Summary: FYI  Phone Note Call from Patient Call back at Home Phone [phone]   Caller: Self Call For: [name] Summary of Call: Pt states that he was told by Dr Ladona Ridgel that he does not need to continue to follow up with Dr Shirlee Latch. Initial call taken by: Harlon Flor,  October 25, 2010 3:24 PM

## 2010-12-09 ENCOUNTER — Encounter: Payer: Self-pay | Admitting: Internal Medicine

## 2010-12-25 LAB — GLUCOSE, CAPILLARY: Glucose-Capillary: 94 mg/dL (ref 70–99)

## 2011-01-08 LAB — CBC
HCT: 44.2 % (ref 39.0–52.0)
Hemoglobin: 13.7 g/dL (ref 13.0–17.0)
Hemoglobin: 15.3 g/dL (ref 13.0–17.0)
MCHC: 34.1 g/dL (ref 30.0–36.0)
MCV: 91.5 fL (ref 78.0–100.0)
Platelets: 219 10*3/uL (ref 150–400)
RBC: 4.39 MIL/uL (ref 4.22–5.81)
RBC: 4.81 MIL/uL (ref 4.22–5.81)
RDW: 13.1 % (ref 11.5–15.5)
WBC: 4.5 10*3/uL (ref 4.0–10.5)
WBC: 5.1 10*3/uL (ref 4.0–10.5)

## 2011-01-08 LAB — PROTIME-INR
INR: 1.1 (ref 0.00–1.49)
Prothrombin Time: 13.8 seconds (ref 11.6–15.2)
Prothrombin Time: 14.1 seconds (ref 11.6–15.2)
Prothrombin Time: 16.3 seconds — ABNORMAL HIGH (ref 11.6–15.2)

## 2011-01-08 LAB — POCT I-STAT, CHEM 8
BUN: 16 mg/dL (ref 6–23)
Calcium, Ion: 1.14 mmol/L (ref 1.12–1.32)
Chloride: 109 mEq/L (ref 96–112)
Creatinine, Ser: 0.9 mg/dL (ref 0.4–1.5)
TCO2: 25 mmol/L (ref 0–100)

## 2011-01-08 LAB — POCT CARDIAC MARKERS: Troponin i, poc: <0.05 ng/mL (ref 0.00–0.09)

## 2011-01-08 LAB — DIFFERENTIAL
Basophils Absolute: 0 10*3/uL (ref 0.0–0.1)
Eosinophils Relative: 1 % (ref 0–5)
Lymphocytes Relative: 36 % (ref 12–46)
Lymphs Abs: 1.8 10*3/uL (ref 0.7–4.0)
Monocytes Absolute: 0.5 10*3/uL (ref 0.1–1.0)
Monocytes Relative: 9 % (ref 3–12)
Neutro Abs: 2.7 10*3/uL (ref 1.7–7.7)

## 2011-01-08 LAB — CARDIAC PANEL(CRET KIN+CKTOT+MB+TROPI)
CK, MB: 1.8 ng/mL (ref 0.3–4.0)
Total CK: 83 U/L (ref 7–232)
Total CK: 92 U/L (ref 7–232)
Troponin I: 0.01 ng/mL (ref 0.00–0.06)

## 2011-01-08 LAB — LIPID PANEL
Cholesterol: 214 mg/dL — ABNORMAL HIGH (ref 0–200)
HDL: 57 mg/dL (ref >39)
Total CHOL/HDL Ratio: 3.8 RATIO
Triglycerides: 37 mg/dL (ref <150)

## 2011-01-08 LAB — HEPATIC FUNCTION PANEL
ALT: 19 U/L (ref 0–53)
Albumin: 3.6 g/dL (ref 3.5–5.2)
Alkaline Phosphatase: 47 U/L (ref 39–117)
Indirect Bilirubin: 0.7 mg/dL (ref 0.3–0.9)
Total Protein: 6.8 g/dL (ref 6.0–8.3)

## 2011-01-08 LAB — TSH: TSH: 0.865 u[IU]/mL (ref 0.350–4.500)

## 2011-01-24 ENCOUNTER — Encounter: Payer: Self-pay | Admitting: Internal Medicine

## 2011-01-24 ENCOUNTER — Ambulatory Visit (INDEPENDENT_AMBULATORY_CARE_PROVIDER_SITE_OTHER): Payer: Medicare Other | Admitting: Internal Medicine

## 2011-01-24 VITALS — BP 140/70 | HR 64 | Temp 98.1°F | Ht 71.0 in | Wt 175.0 lb

## 2011-01-24 DIAGNOSIS — N401 Enlarged prostate with lower urinary tract symptoms: Secondary | ICD-10-CM

## 2011-01-24 DIAGNOSIS — Z0000 Encounter for general adult medical examination without abnormal findings: Secondary | ICD-10-CM

## 2011-01-24 DIAGNOSIS — M1990 Unspecified osteoarthritis, unspecified site: Secondary | ICD-10-CM

## 2011-01-24 DIAGNOSIS — E785 Hyperlipidemia, unspecified: Secondary | ICD-10-CM

## 2011-01-24 DIAGNOSIS — F4322 Adjustment disorder with anxiety: Secondary | ICD-10-CM

## 2011-01-24 DIAGNOSIS — I4891 Unspecified atrial fibrillation: Secondary | ICD-10-CM

## 2011-01-24 DIAGNOSIS — K5909 Other constipation: Secondary | ICD-10-CM

## 2011-01-24 DIAGNOSIS — Z2911 Encounter for prophylactic immunotherapy for respiratory syncytial virus (RSV): Secondary | ICD-10-CM

## 2011-01-24 DIAGNOSIS — I10 Essential (primary) hypertension: Secondary | ICD-10-CM

## 2011-01-24 LAB — HEPATIC FUNCTION PANEL
AST: 17 U/L (ref 0–37)
Albumin: 3.9 g/dL (ref 3.5–5.2)
Total Protein: 7.2 g/dL (ref 6.0–8.3)

## 2011-01-24 LAB — BASIC METABOLIC PANEL
CO2: 29 mEq/L (ref 19–32)
Chloride: 108 mEq/L (ref 96–112)
Glucose, Bld: 85 mg/dL (ref 70–99)
Potassium: 4.7 mEq/L (ref 3.5–5.1)
Sodium: 143 mEq/L (ref 135–145)

## 2011-01-24 LAB — CBC WITH DIFFERENTIAL/PLATELET
Basophils Absolute: 0 10*3/uL (ref 0.0–0.1)
Eosinophils Relative: 1 % (ref 0.0–5.0)
HCT: 41.5 % (ref 39.0–52.0)
Lymphocytes Relative: 32.6 % (ref 12.0–46.0)
Monocytes Relative: 9.6 % (ref 3.0–12.0)
Neutrophils Relative %: 56.1 % (ref 43.0–77.0)
Platelets: 277 10*3/uL (ref 150.0–400.0)
RDW: 13.5 % (ref 11.5–14.6)
WBC: 5.3 10*3/uL (ref 4.5–10.5)

## 2011-01-24 LAB — LIPID PANEL
Cholesterol: 234 mg/dL — ABNORMAL HIGH (ref 0–200)
HDL: 53.4 mg/dL (ref >39.00)
Triglycerides: 89 mg/dL (ref 0.0–149.0)

## 2011-01-24 LAB — TSH: TSH: 1.17 u[IU]/mL (ref 0.35–5.50)

## 2011-01-24 MED ORDER — FLECAINIDE ACETATE 50 MG PO TABS: 50.0000 mg | ORAL_TABLET | Freq: Two times a day (BID) | ORAL | Status: DC

## 2011-01-24 MED ORDER — ALPRAZOLAM 0.25 MG PO TABS: 0.2500 mg | ORAL_TABLET | Freq: Two times a day (BID) | ORAL | Status: DC | PRN

## 2011-01-24 NOTE — Progress Notes (Signed)
Subjective:    Patient ID: [name], male    DOB: 1933-01-31, 77 y.o.   MRN: [id number]  HPI Doing okay Stress with wife--has colon cancer and needed repeat surgery.  Now recovering He has been caregiving Uses the alprazolam at night  Heart has been okay Does feel the pacemaker kick in at times Has occ times he awakens SOB---he attributes to stress  Mild arthritis pain--occ uses ibuprofen (fairly rare) Has been playing golf--keeps his limber  Voids okay Nocturia several times but no sig change  Still constipation Miralax not helpful--still needs the MOM every 3 days or so    Review of Systems  Constitutional: Negative for fatigue and unexpected weight change.       Wears seat belt---under left arm to avoid pacer  HENT: Positive for hearing loss and rhinorrhea. Negative for congestion, dental problem, postnasal drip and tinnitus.        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       No vision loss or diplopia  Respiratory: Negative for cough, chest tightness and shortness of breath.   Cardiovascular: Positive for palpitations. Negative for chest pain and leg swelling.  Gastrointestinal: Positive for abdominal pain and constipation. Negative for nausea, vomiting and blood in stool.       Occ pain he relates to constipation Rare heartburn  Genitourinary: Negative for dysuria, urgency, decreased urine volume and difficulty urinating.       Nocturia as noted  Musculoskeletal: Positive for arthralgias. Negative for joint swelling.       Left shoulder is better  Skin: Negative for rash.       Has itchy area on his back--seems to flake off at times  Neurological: Negative for dizziness, syncope, weakness, numbness and headaches.  Hematological: Negative for adenopathy. Bruises/bleeds easily.       Bruises easy on ASA  Psychiatric/Behavioral: Positive for sleep disturbance and dysphoric mood.  The patient is nervous/anxious.        Occ down mood with wife and economy Not persistent       Objective:   Physical Exam  Constitutional: He is oriented to person, place, and time. He appears well-developed and well-nourished. No distress.  HENT:  Head: Normocephalic and atraumatic.  Right Ear: External ear normal.  Left Ear: External ear normal.  Mouth/Throat: Oropharynx is clear and moist. No oropharyngeal exudate.       TMs normal  Eyes: Conjunctivae and EOM are normal. Pupils are equal, round, and reactive to light.       Fundi benign  Neck: Normal range of motion. Neck supple. No thyromegaly present.  Cardiovascular: Normal rate, regular rhythm, normal heart sounds and intact distal pulses.  Exam reveals no gallop.   No murmur heard. Pulmonary/Chest: Effort normal and breath sounds normal. No respiratory distress. He has no wheezes. He has no rales.  Abdominal: Soft. He exhibits no mass. There is no tenderness.  Musculoskeletal: Normal range of motion. He exhibits no edema and no tenderness.  Lymphadenopathy:    He has no cervical adenopathy.  Neurological: He is alert and oriented to person, place, and time. He exhibits normal muscle tone.       No weakness Normal gait  Skin: Skin is warm. No rash noted.       seb keratosis on back Lipoma on right shoulder stable  Psychiatric: He has a normal mood and affect. His behavior is normal. Judgment  and thought content normal.          Assessment & Plan:

## 2011-02-23 NOTE — Op Note (Signed)
NAME:  [name], [name]                 ACCOUNT NO.:  [id number]   MEDICAL RECORD NO.:  [id number]          PATIENT TYPE:  AMB   LOCATION:  ENDO                         FACILITY:  [facility]   PHYSICIAN:  John C. Madilyn Fireman, M.D.    DATE OF BIRTH:  09/06/1933   DATE OF PROCEDURE:  04/23/2005  DATE OF DISCHARGE:                                 OPERATIVE REPORT   PROCEDURE:  Colonoscopy.   INDICATIONS FOR PROCEDURE:  Average risk colon cancer screening.   PROCEDURE:  The patient was placed in the left lateral decubitus position  and placed on the pulse monitor with continuous low-flow oxygen delivered by  nasal cannula.  He was sedated with 62.5 mcg IV fentanyl and 6 mg IV Versed.  The Olympus video colonoscope was inserted in the rectum and advanced to  cecum, confirmed by transillumination of McBurney's point and visualization  of the ileocecal valve and appendiceal orifice.  The prep was excellent.  Within the base of the cecum, there was an 8 mm polyp that was removed by  snare.  The remainder of the cecum appeared normal.  Within the ascending,  transverse, descending, and sigmoid colon, there were a few scattered  diverticula and no other abnormalities.  At the rectosigmoid junction at  approximately 20 cm, there was another 8-mm sessile polyp that was removed  by snare.  The rectum distal to the polyp appeared normal.  The scope was  then withdrawn and the patient returned to the recovery room in stable  condition.  He tolerated the procedure well, and there were no immediate  complications.   IMPRESSION:  1.  Cecal and rectosigmoid colon polyps.  2.  Scattered left and right-sided diverticula.   PLAN:  We will await histology to determine method and interval for future  colon screening.       J[facility]/MEDQ  D:  04/23/2005  T:  04/23/2005  Job:  [id number]   cc:   Maryla Morrow. Modesto Charon, M.D.  [address]  [place]  [place] [address]  Fax: [phone]

## 2011-06-06 ENCOUNTER — Ambulatory Visit (INDEPENDENT_AMBULATORY_CARE_PROVIDER_SITE_OTHER): Payer: Medicare Other | Admitting: *Deleted

## 2011-06-06 DIAGNOSIS — I495 Sick sinus syndrome: Secondary | ICD-10-CM

## 2011-06-06 DIAGNOSIS — I4891 Unspecified atrial fibrillation: Secondary | ICD-10-CM

## 2011-06-06 LAB — PACEMAKER DEVICE OBSERVATION
AL THRESHOLD: 1 V
ATRIAL PACING PM: 97
BAMS-0001: 175 {beats}/min
RV LEAD IMPEDENCE PM: 612 Ohm
RV LEAD THRESHOLD: 0.75 V
VENTRICULAR PACING PM: 0

## 2011-07-27 ENCOUNTER — Ambulatory Visit: Payer: Medicare Other | Admitting: Internal Medicine

## 2011-07-27 ENCOUNTER — Ambulatory Visit (INDEPENDENT_AMBULATORY_CARE_PROVIDER_SITE_OTHER): Payer: Medicare Other | Admitting: Internal Medicine

## 2011-07-27 ENCOUNTER — Encounter: Payer: Self-pay | Admitting: Internal Medicine

## 2011-07-27 ENCOUNTER — Other Ambulatory Visit: Payer: Self-pay | Admitting: Internal Medicine

## 2011-07-27 VITALS — BP 146/78 | HR 75 | Temp 97.8°F | Ht 71.0 in | Wt 165.1 lb

## 2011-07-27 DIAGNOSIS — F4322 Adjustment disorder with anxiety: Secondary | ICD-10-CM

## 2011-07-27 DIAGNOSIS — I4891 Unspecified atrial fibrillation: Secondary | ICD-10-CM

## 2011-07-27 DIAGNOSIS — Z23 Encounter for immunization: Secondary | ICD-10-CM

## 2011-07-27 DIAGNOSIS — N401 Enlarged prostate with lower urinary tract symptoms: Secondary | ICD-10-CM

## 2011-07-27 DIAGNOSIS — I10 Essential (primary) hypertension: Secondary | ICD-10-CM

## 2011-07-27 DIAGNOSIS — R5383 Other fatigue: Secondary | ICD-10-CM | POA: Insufficient documentation

## 2011-07-27 LAB — CBC WITH DIFFERENTIAL/PLATELET
Basophils Absolute: 0 10*3/uL (ref 0.0–0.1)
Eosinophils Relative: 0.4 % (ref 0.0–5.0)
HCT: 42 % (ref 39.0–52.0)
Hemoglobin: 14.3 g/dL (ref 13.0–17.0)
Lymphocytes Relative: 35.9 % (ref 12.0–46.0)
Lymphs Abs: 1.7 10*3/uL (ref 0.7–4.0)
Monocytes Relative: 9.1 % (ref 3.0–12.0)
Platelets: 280 10*3/uL (ref 150.0–400.0)
RDW: 13.6 % (ref 11.5–14.6)
WBC: 4.7 10*3/uL (ref 4.5–10.5)

## 2011-07-27 LAB — BASIC METABOLIC PANEL
BUN: 21 mg/dL (ref 6–23)
Calcium: 9 mg/dL (ref 8.4–10.5)
GFR: 81.81 mL/min (ref >60.00)
Glucose, Bld: 105 mg/dL — ABNORMAL HIGH (ref 70–99)
Potassium: 4.2 mEq/L (ref 3.5–5.1)
Sodium: 143 mEq/L (ref 135–145)

## 2011-07-27 LAB — TSH: TSH: 0.97 u[IU]/mL (ref 0.35–5.50)

## 2011-07-27 LAB — HEPATIC FUNCTION PANEL
AST: 18 U/L (ref 0–37)
Alkaline Phosphatase: 62 U/L (ref 39–117)
Bilirubin, Direct: 0 mg/dL (ref 0.0–0.3)

## 2011-07-27 MED ORDER — TAMSULOSIN HCL 0.4 MG PO CAPS: 0.4000 mg | ORAL_CAPSULE | Freq: Every day | ORAL | Status: DC

## 2011-07-27 NOTE — Assessment & Plan Note (Signed)
Non specific Seems mood related---changed life from wife's limitations ??sleep related Will treat prostate Consider antidepressant

## 2011-07-27 NOTE — Telephone Encounter (Signed)
Please advise 

## 2011-07-27 NOTE — Telephone Encounter (Signed)
Left a message for patient to return call.

## 2011-07-27 NOTE — Assessment & Plan Note (Signed)
BP Readings from Last 3 Encounters:  07/27/11 146/78  01/24/11 140/70  09/08/10 158/92   Generally acceptable control No changes for now

## 2011-07-27 NOTE — Telephone Encounter (Signed)
Okay #60 x 0 He is due for follow up appt---please have him set up

## 2011-07-27 NOTE — Assessment & Plan Note (Signed)
Increased nocturia affecting sleep Will try tamsulosin

## 2011-07-27 NOTE — Progress Notes (Signed)
Subjective:    Patient ID: [name], male    DOB: Apr 06, 1933, 77 y.o.   MRN: [id number]  HPI "I don't feel good" --for weeks Gets tired easy No energy Not sleeping well---most he gets is 2 hours then up for nocturia. Does get back to sleep but then up again Some depression--both he and wife are limited, can't go out, etc Does have some good days Not anhedonic---still enjoys golf 2-3 times per week  Wife still with medical problems Slow recovery from colon cancer---2 surgeries, RT, chemo  No chest pain No SOB occ feels heart "kick in" No prolonged palpitations  Ongoing constipation miralax didn't help MOM helps --seems to be only way he goes (every 3-4 days)  Current Outpatient Prescriptions on File Prior to Visit  Medication Sig Dispense Refill  . ALPRAZolam (XANAX) 0.25 MG tablet Take 1 tablet (0.25 mg total) by mouth 2 (two) times daily as needed. For anxiety  60 tablet  1  . aspirin EC 325 MG EC tablet Take 325 mg by mouth daily.        [name] diltiazem (TIAZAC) 360 MG 24 hr capsule Take 360 mg by mouth daily.        . flecainide (TAMBOCOR) 50 MG tablet Take 1 tablet (50 mg total) by mouth 2 (two) times daily.  60 tablet  11  . Glucosamine 500 MG CAPS Take by mouth daily.        [name] ibuprofen (ADVIL,MOTRIN) 200 MG tablet Take 200 mg by mouth every 6 (six) hours as needed.          No Known Allergies  Past Medical History  Diagnosis Date  . Hyperlipemia   . Hypertension   . Osteoarthritis   . BPH (benign prostatic hyperplasia)   . Allergic rhinitis   . Atrial fibrillation or flutter 12/10    Has been paroxysmal. Patient has not tolerated coumadin due to headaches. ECHO (12/10) Mild LVH, EF 55%,   . Sick sinus syndrome     S/P dual chamber Medtronic PCM  . Chest pain 12/10    Lexiscan myoview with EF 55%, no ischemia or infarction    Past Surgical History  Procedure Date  . Vasectomy in his 50's  . Dual chamber pacemaker 01/11    Dr. Ladona Ridgel    Family History    Problem Relation Age of Onset  . Hypertension Mother   . Alcohol abuse Father     History   Social History  . Marital Status: Married    Spouse Name: N/A    Number of Children: 3  . Years of Education: N/A   Occupational History  . Retired     Animator   Social History Main Topics  . Smoking status: Former Games developer  . Smokeless tobacco: Not on file   Comment: In [employer], quit in 1960's  . Alcohol Use: No  . Drug Use: Not on file  . Sexually Active: Not on file   Other Topics Concern  . Not on file   Social History Narrative   2 Daughters, one son killed in MVA   Review of Systems Appetite is fair Has lost some weight though    Objective:   Physical Exam  Constitutional: He appears well-developed and well-nourished. No distress.  Neck: Normal range of motion. Neck supple. No thyromegaly present.  Cardiovascular: Normal rate, regular rhythm and normal heart sounds.  Exam reveals no gallop.   No murmur heard. Pulmonary/Chest: Effort normal and breath sounds  normal. No respiratory distress. He has no wheezes. He has no rales.  Abdominal: Soft. There is no tenderness.  Lymphadenopathy:    He has no cervical adenopathy.  Psychiatric: His behavior is normal. Judgment and thought content normal.       Not depressed but ?melancholy          Assessment & Plan:

## 2011-07-27 NOTE — Assessment & Plan Note (Signed)
Paced On aspirin

## 2011-07-27 NOTE — Assessment & Plan Note (Signed)
Ongoing mood problems May need to consider meds

## 2011-10-04 ENCOUNTER — Other Ambulatory Visit: Payer: Self-pay

## 2011-10-04 MED ORDER — DILTIAZEM HCL ER BEADS 360 MG PO CP24: 360.0000 mg | ORAL_CAPSULE | Freq: Every day | ORAL | Status: DC

## 2011-10-15 ENCOUNTER — Ambulatory Visit: Payer: Medicare Other | Admitting: Internal Medicine

## 2011-10-17 ENCOUNTER — Ambulatory Visit (INDEPENDENT_AMBULATORY_CARE_PROVIDER_SITE_OTHER): Payer: Medicare Other | Admitting: Internal Medicine

## 2011-10-17 ENCOUNTER — Encounter: Payer: Self-pay | Admitting: Internal Medicine

## 2011-10-17 DIAGNOSIS — N401 Enlarged prostate with lower urinary tract symptoms: Secondary | ICD-10-CM

## 2011-10-17 DIAGNOSIS — R5383 Other fatigue: Secondary | ICD-10-CM

## 2011-10-17 DIAGNOSIS — R531 Weakness: Secondary | ICD-10-CM | POA: Insufficient documentation

## 2011-10-17 DIAGNOSIS — R21 Rash and other nonspecific skin eruption: Secondary | ICD-10-CM

## 2011-10-17 NOTE — Progress Notes (Signed)
Subjective:    Patient ID: [name], male    DOB: 1933/03/16, 78 y.o.   MRN: [id number]  HPI Hasn't noted major change from prostate med Still with nocturia x 3-4 Flow may be better though Able to get back to sleep   Feels his fatigue is better He feels this is related to issues with wife Cancer is cured but still has had weight loss and hasn't fully recovered  No heart problems  He has also noted a rash on chest, back on left cheek Has gradually appeared over the past few months Some itching and scabby type feeling No inflammation or oozing  Current Outpatient Prescriptions on File Prior to Visit  Medication Sig Dispense Refill  . ALPRAZolam (XANAX) 0.25 MG tablet TAKE ONE TABLET BY MOUTH TWICE DAILY AS NEEDED FOR ANXIETY  60 tablet  0  . aspirin EC 325 MG EC tablet Take 325 mg by mouth daily.        [name] diltiazem (TIAZAC) 360 MG 24 hr capsule Take 1 capsule (360 mg total) by mouth daily.  30 capsule  2  . flecainide (TAMBOCOR) 50 MG tablet Take 1 tablet (50 mg total) by mouth 2 (two) times daily.  60 tablet  11  . Glucosamine 500 MG CAPS Take by mouth daily.        [name] ibuprofen (ADVIL,MOTRIN) 200 MG tablet Take 200 mg by mouth every 6 (six) hours as needed.        . Tamsulosin HCl (FLOMAX) 0.4 MG CAPS Take 1 capsule (0.4 mg total) by mouth daily.  30 capsule  11    No Known Allergies  Past Medical History  Diagnosis Date  . Hyperlipemia   . Hypertension   . Osteoarthritis   . BPH (benign prostatic hyperplasia)   . Allergic rhinitis   . Atrial fibrillation or flutter 12/10    Has been paroxysmal. Patient has not tolerated coumadin due to headaches. ECHO (12/10) Mild LVH, EF 55%,   . Sick sinus syndrome     S/P dual chamber Medtronic PCM  . Chest pain 12/10    Lexiscan myoview with EF 55%, no ischemia or infarction    Past Surgical History  Procedure Date  . Vasectomy in his 10's  . Dual chamber pacemaker 01/11    Dr. Ladona Ridgel    Family History  Problem  Relation Age of Onset  . Hypertension Mother   . Alcohol abuse Father     History   Social History  . Marital Status: Married    Spouse Name: N/A    Number of Children: 3  . Years of Education: N/A   Occupational History  . Retired     Animator   Social History Main Topics  . Smoking status: Former Games developer  . Smokeless tobacco: Never Used   Comment: In [employer], quit in 1960's  . Alcohol Use: No  . Drug Use: Not on file  . Sexually Active: Not on file   Other Topics Concern  . Not on file   Social History Narrative   2 Daughters, one son killed in MVA      Review of Systems     Objective:   Physical Exam  Constitutional: He appears well-developed and well-nourished. No distress.  Skin: Rash noted.       Non specific dry patches with slight papules on low back and mid chest  Psychiatric: He has a normal mood and affect. His behavior is normal. Judgment and thought  content normal.          Assessment & Plan:

## 2011-10-17 NOTE — Assessment & Plan Note (Signed)
No sig improvement on the tamsulosin Will stop Consider finasteride if symptoms worsen

## 2011-10-17 NOTE — Assessment & Plan Note (Signed)
better Seems to be related to his wife's illness No action needed

## 2011-10-17 NOTE — Assessment & Plan Note (Signed)
Non specific but just looks like dry skin Discussed moisturizers and can use OTC hydrocortisone cream

## 2011-11-16 ENCOUNTER — Other Ambulatory Visit: Payer: Self-pay | Admitting: Internal Medicine

## 2011-11-16 NOTE — Telephone Encounter (Signed)
plz phone in. 

## 2011-11-16 NOTE — Telephone Encounter (Signed)
rx called into pharmacy

## 2011-12-04 ENCOUNTER — Encounter: Payer: Self-pay | Admitting: Internal Medicine

## 2011-12-04 ENCOUNTER — Ambulatory Visit (INDEPENDENT_AMBULATORY_CARE_PROVIDER_SITE_OTHER): Payer: Medicare Other | Admitting: Internal Medicine

## 2011-12-04 DIAGNOSIS — I495 Sick sinus syndrome: Secondary | ICD-10-CM

## 2011-12-04 DIAGNOSIS — Z950 Presence of cardiac pacemaker: Secondary | ICD-10-CM

## 2011-12-04 DIAGNOSIS — I10 Essential (primary) hypertension: Secondary | ICD-10-CM

## 2011-12-04 DIAGNOSIS — I4891 Unspecified atrial fibrillation: Secondary | ICD-10-CM

## 2011-12-04 LAB — PACEMAKER DEVICE OBSERVATION
AL IMPEDENCE PM: 537 Ohm
ATRIAL PACING PM: 95
BAMS-0001: 175 {beats}/min
BATTERY VOLTAGE: 2.8 V
VENTRICULAR PACING PM: 0

## 2011-12-04 NOTE — Assessment & Plan Note (Signed)
The patient is maintaining sinus rhythm over 99% of the time. His longest episode of atrial fibrillation was approximately 2 hours. He will continue his current medical therapy. If he has more palpitations, he is instructed to increase his flecainide by 50 mg daily in divided doses.

## 2011-12-04 NOTE — Assessment & Plan Note (Signed)
His blood pressure is minimally elevated today. He will continue his current medical therapy and maintain a low-sodium diet.

## 2011-12-04 NOTE — Progress Notes (Signed)
HPI [name] returns today for followup. He is a 78 year old man with a history of symptomatic tachybradycardia syndrome, hypertension, who is status post permanent pacemaker insertion. Over the last year, he has done well. He denies chest pain, shortness of breath, or peripheral edema. He plays golf 3-5 times a week. No syncope. He has rare palpitations. No Known Allergies   Current Outpatient Prescriptions  Medication Sig Dispense Refill  . ALPRAZolam (XANAX) 0.25 MG tablet TAKE 1 TABLET BY MOUTH TWICE DAILY AS NEEDED FOR ANXIETY  60 tablet  0  . aspirin EC 325 MG EC tablet Take 325 mg by mouth daily.        [name] diltiazem (TIAZAC) 360 MG 24 hr capsule Take 1 capsule (360 mg total) by mouth daily.  30 capsule  2  . flecainide (TAMBOCOR) 50 MG tablet Take 1 tablet (50 mg total) by mouth 2 (two) times daily.  60 tablet  11  . Glucosamine 500 MG CAPS Take by mouth daily.        [name] ibuprofen (ADVIL,MOTRIN) 200 MG tablet Take 200 mg by mouth every 6 (six) hours as needed.        . Tamsulosin HCl (FLOMAX) 0.4 MG CAPS Take by mouth daily.         Past Medical History  Diagnosis Date  . Hyperlipemia   . Hypertension   . Osteoarthritis   . BPH (benign prostatic hyperplasia)   . Allergic rhinitis   . Atrial fibrillation or flutter 12/10    Has been paroxysmal. Patient has not tolerated coumadin due to headaches. ECHO (12/10) Mild LVH, EF 55%,   . Sick sinus syndrome     S/P dual chamber Medtronic PCM  . Chest pain 12/10    Lexiscan myoview with EF 55%, no ischemia or infarction    ROS:   All systems reviewed and negative except as noted in the HPI.   Past Surgical History  Procedure Date  . Vasectomy in his 81's  . Dual chamber pacemaker 01/11    Dr. Ladona Ridgel     Family History  Problem Relation Age of Onset  . Hypertension Mother   . Alcohol abuse Father      History   Social History  . Marital Status: Married    Spouse Name: N/A    Number of Children: 3  . Years of  Education: N/A   Occupational History  . Retired     Animator   Social History Main Topics  . Smoking status: Former Games developer  . Smokeless tobacco: Never Used   Comment: In [employer], quit in 1960's  . Alcohol Use: No  . Drug Use: No  . Sexually Active: Not on file   Other Topics Concern  . Not on file   Social History Narrative   2 Daughters, one son killed in MVA     BP 140/80  Pulse 70  Ht 6' (1.829 m)  Wt 78.926 kg (174 lb)  BMI 23.60 kg/m2  Physical Exam:  Well appearing 78 year old man, NAD HEENT: Unremarkable Neck:  No JVD, no thyromegally Lymphatics:  No adenopathy Back:  No CVA tenderness Lungs:  Clear with no wheezes, rales, or rhonchi. Well-healed pacemaker incision. HEART:  Regular rate rhythm, no murmurs, no rubs, no clicks Abd:  soft, positive bowel sounds, no organomegally, no rebound, no guarding Ext:  2 plus pulses, no edema, no cyanosis, no clubbing Skin:  No rashes no nodules Neuro:  CN II through XII intact, motor grossly  intact  EKG Normal sinus rhythm with nonspecific ST-T wave abnormality. DEVICE  Normal device function.  See PaceArt for details.   Assess/Plan:

## 2011-12-04 NOTE — Patient Instructions (Addendum)
PLEASE SEND IN REMOTE THROUGH CARE LINK Follow up with Dr. Ladona Ridgel in one year. Needs to be set up for phone checks.

## 2011-12-04 NOTE — Assessment & Plan Note (Signed)
His device is working normally. We'll plan to recheck in several months. 

## 2011-12-24 ENCOUNTER — Encounter: Payer: Medicare Other | Admitting: Internal Medicine

## 2011-12-27 ENCOUNTER — Other Ambulatory Visit: Payer: Self-pay

## 2011-12-27 ENCOUNTER — Other Ambulatory Visit: Payer: Self-pay | Admitting: Internal Medicine

## 2011-12-27 MED ORDER — DILTIAZEM HCL ER BEADS 360 MG PO CP24: 360.0000 mg | ORAL_CAPSULE | Freq: Every day | ORAL | Status: DC

## 2012-01-09 ENCOUNTER — Encounter: Payer: Self-pay | Admitting: Internal Medicine

## 2012-01-31 ENCOUNTER — Other Ambulatory Visit: Payer: Self-pay | Admitting: Internal Medicine

## 2012-03-06 ENCOUNTER — Encounter: Payer: Medicare Other | Admitting: *Deleted

## 2012-03-10 ENCOUNTER — Other Ambulatory Visit: Payer: Self-pay | Admitting: *Deleted

## 2012-03-10 NOTE — Telephone Encounter (Signed)
OK to refill? Last OV 10/17/11 and last RF 11/16/11.

## 2012-03-11 ENCOUNTER — Encounter: Payer: Self-pay | Admitting: *Deleted

## 2012-03-11 MED ORDER — ALPRAZOLAM 0.25 MG PO TABS: 0.2500 mg | ORAL_TABLET | Freq: Two times a day (BID) | ORAL | Status: DC | PRN

## 2012-03-11 NOTE — Telephone Encounter (Signed)
rx called into pharmacy

## 2012-03-11 NOTE — Telephone Encounter (Signed)
Okay #60 x 0 

## 2012-03-17 ENCOUNTER — Telehealth: Payer: Self-pay | Admitting: Internal Medicine

## 2012-03-17 NOTE — Telephone Encounter (Signed)
Please return call to patient at [phone],  Concerning remote transmission box.

## 2012-03-17 NOTE — Telephone Encounter (Signed)
Spoke w/pt in regards to transmitter box---pt had to change battery but still blinking. Instructed for pt wand over ppm and started with transmitting. Pt would like return call if transmission received.

## 2012-04-16 ENCOUNTER — Ambulatory Visit (INDEPENDENT_AMBULATORY_CARE_PROVIDER_SITE_OTHER): Payer: Medicare Other | Admitting: Internal Medicine

## 2012-04-16 ENCOUNTER — Encounter: Payer: Self-pay | Admitting: Internal Medicine

## 2012-04-16 VITALS — BP 128/60 | HR 67 | Temp 98.4°F | Ht 71.0 in | Wt 174.0 lb

## 2012-04-16 DIAGNOSIS — I4891 Unspecified atrial fibrillation: Secondary | ICD-10-CM

## 2012-04-16 DIAGNOSIS — I10 Essential (primary) hypertension: Secondary | ICD-10-CM

## 2012-04-16 DIAGNOSIS — Z0000 Encounter for general adult medical examination without abnormal findings: Secondary | ICD-10-CM

## 2012-04-16 DIAGNOSIS — N401 Enlarged prostate with lower urinary tract symptoms: Secondary | ICD-10-CM

## 2012-04-16 LAB — HEPATIC FUNCTION PANEL
AST: 17 U/L (ref 0–37)
Alkaline Phosphatase: 63 U/L (ref 39–117)
Total Bilirubin: 0.5 mg/dL (ref 0.3–1.2)

## 2012-04-16 LAB — CBC WITH DIFFERENTIAL/PLATELET
Basophils Absolute: 0 10*3/uL (ref 0.0–0.1)
Eosinophils Relative: 1.7 % (ref 0.0–5.0)
Monocytes Absolute: 0.5 10*3/uL (ref 0.1–1.0)
Monocytes Relative: 10 % (ref 3.0–12.0)
Neutrophils Relative %: 41.5 % — ABNORMAL LOW (ref 43.0–77.0)
Platelets: 227 10*3/uL (ref 150.0–400.0)
WBC: 5 10*3/uL (ref 4.5–10.5)

## 2012-04-16 LAB — BASIC METABOLIC PANEL
BUN: 20 mg/dL (ref 6–23)
Creatinine, Ser: 1.2 mg/dL (ref 0.4–1.5)
GFR: 61.76 mL/min (ref >60.00)

## 2012-04-16 LAB — TSH: TSH: 0.91 u[IU]/mL (ref 0.35–5.50)

## 2012-04-16 MED ORDER — FINASTERIDE 5 MG PO TABS: 5.0000 mg | ORAL_TABLET | Freq: Every day | ORAL | Status: DC

## 2012-04-16 NOTE — Progress Notes (Signed)
Subjective:    Patient ID: [name], male    DOB: January 13, 1933, 78 y.o.   MRN: [id number]  HPI Here for physical Doing well in general Notes some DOE if he really pushes it--like 30 minutes raking gravel. Then rests for 5 minutes and can go back to work [employer] up to 3-4 times a week  May feel his heart go irregular and fast if he uses power equipment up against his chest (like tree limb trimmer). Discussed avoiding this  Bad hearing ---considering aides Vision fine  Having trouble voiding still No real response from flomax but he kept trying it Nocturia x 4-5 often No major daytime issues  Current Outpatient Prescriptions on File Prior to Visit  Medication Sig Dispense Refill  . ALPRAZolam (XANAX) 0.25 MG tablet Take 1 tablet (0.25 mg total) by mouth 2 (two) times daily as needed for anxiety.  60 tablet  0  . aspirin EC 325 MG EC tablet Take 325 mg by mouth daily.        [name] diltiazem (TIAZAC) 360 MG 24 hr capsule Take 1 capsule (360 mg total) by mouth daily.  30 capsule  11  . flecainide (TAMBOCOR) 50 MG tablet TAKE ONE TABLET BY MOUTH TWICE DAILY  60 tablet  10  . Glucosamine 500 MG CAPS Take by mouth daily.        [name] ibuprofen (ADVIL,MOTRIN) 200 MG tablet Take 200 mg by mouth every 6 (six) hours as needed.        . Tamsulosin HCl (FLOMAX) 0.4 MG CAPS Take by mouth daily.        No Known Allergies  Past Medical History  Diagnosis Date  . Hyperlipemia   . Hypertension   . Osteoarthritis   . BPH (benign prostatic hyperplasia)   . Allergic rhinitis   . Atrial fibrillation or flutter 12/10    Has been paroxysmal. Patient has not tolerated coumadin due to headaches. ECHO (12/10) Mild LVH, EF 55%,   . Sick sinus syndrome     S/P dual chamber Medtronic PCM  . Chest pain 12/10    Lexiscan myoview with EF 55%, no ischemia or infarction    Past Surgical History  Procedure Date  . Vasectomy in his 45's  . Dual chamber pacemaker 01/11    Dr. Ladona Ridgel    Family History    Problem Relation Age of Onset  . Hypertension Mother   . Alcohol abuse Father     History   Social History  . Marital Status: Married    Spouse Name: N/A    Number of Children: 3  . Years of Education: N/A   Occupational History  . Retired     Animator   Social History Main Topics  . Smoking status: Former Games developer  . Smokeless tobacco: Never Used   Comment: In [employer], quit in 1960's  . Alcohol Use: No  . Drug Use: No  . Sexually Active: Not on file   Other Topics Concern  . Not on file   Social History Narrative   2 Daughters, one son [place] [facility] living willWife, then daughter, is health care POAWould accept resuscitation attempts but no prolonged life support.No tube feeding if cognitively unaware      Review of Systems  Constitutional: Negative for fatigue and unexpected weight change.       Wears seat belt  HENT: Positive for hearing loss, congestion and rhinorrhea. Negative for dental problem and tinnitus.  Regular with dentist Some congestion---occ takes zyrtec-D (rarely). Regular zyrtec didn't work  Eyes: Negative for visual disturbance.       No diplopia or unilateral vision loss  Respiratory: Negative for cough, chest tightness and shortness of breath.   Cardiovascular: Positive for palpitations. Negative for chest pain and leg swelling.  Gastrointestinal: Positive for constipation. Negative for nausea, vomiting, abdominal pain and blood in stool.       More regular heartburn---uses alka-selzer (?twice a week) Chronic heartburn---uses MOM prn (twice a week or so)  Genitourinary: Positive for difficulty urinating. Negative for dysuria and urgency.       Frequent nocturia Still sexually active and does okay  Musculoskeletal: Positive for arthralgias. Negative for back pain and joint swelling.       Occ pain when raking gravel on long driveway  Skin: Positive for rash.       Had scaly rash on face---Rx from dermatologist resolved No  suspicious lesions  Neurological: Negative for dizziness, syncope, weakness, light-headedness, numbness and headaches.  Hematological: Negative for adenopathy. Bruises/bleeds easily.  Psychiatric/Behavioral: Negative for disturbed wake/sleep cycle and dysphoric mood. The patient is not nervous/anxious.        Objective:   Physical Exam  Constitutional: He is oriented to person, place, and time. He appears well-developed and well-nourished. No distress.  HENT:  Head: Normocephalic and atraumatic.  Right Ear: External ear normal.  Left Ear: External ear normal.  Mouth/Throat: Oropharynx is clear and moist. No oropharyngeal exudate.  Eyes: Conjunctivae and EOM are normal. Pupils are equal, round, and reactive to light.  Neck: Normal range of motion. Neck supple. No thyromegaly present.  Cardiovascular: Normal rate, regular rhythm, normal heart sounds and intact distal pulses.  Exam reveals no gallop.   No murmur heard. Pulmonary/Chest: Effort normal and breath sounds normal. No respiratory distress. He has no wheezes. He has no rales.  Abdominal: Soft. There is no tenderness.  Musculoskeletal: He exhibits no edema and no tenderness.  Lymphadenopathy:    He has no cervical adenopathy.  Neurological: He is alert and oriented to person, place, and time.  Skin: No rash noted. No erythema.  Psychiatric: He has a normal mood and affect. His behavior is normal. Thought content normal.          Assessment & Plan:

## 2012-04-16 NOTE — Assessment & Plan Note (Signed)
Ongoing nocturia He doesn't think the decongestant worsens Will try finasteride

## 2012-04-16 NOTE — Assessment & Plan Note (Signed)
Healthy and stays active UTD on colon No PSA imms fine

## 2012-04-16 NOTE — Assessment & Plan Note (Signed)
BP Readings from Last 3 Encounters:  04/16/12 128/60  12/04/11 140/80  10/17/11 140/80   Good control No changes

## 2012-04-16 NOTE — Assessment & Plan Note (Signed)
Paced rhythm On coumadin 

## 2012-04-21 ENCOUNTER — Encounter: Payer: Self-pay | Admitting: *Deleted

## 2012-04-28 ENCOUNTER — Encounter: Payer: Self-pay | Admitting: Family Medicine

## 2012-04-28 ENCOUNTER — Ambulatory Visit (INDEPENDENT_AMBULATORY_CARE_PROVIDER_SITE_OTHER): Payer: Medicare Other | Admitting: Family Medicine

## 2012-04-28 VITALS — BP 128/80 | HR 65 | Temp 97.8°F | Ht 71.0 in | Wt 169.0 lb

## 2012-04-28 DIAGNOSIS — M79669 Pain in unspecified lower leg: Secondary | ICD-10-CM | POA: Insufficient documentation

## 2012-04-28 DIAGNOSIS — R300 Dysuria: Secondary | ICD-10-CM

## 2012-04-28 DIAGNOSIS — M79609 Pain in unspecified limb: Secondary | ICD-10-CM

## 2012-04-28 DIAGNOSIS — R350 Frequency of micturition: Secondary | ICD-10-CM

## 2012-04-28 LAB — POCT URINALYSIS DIPSTICK
Glucose, UA: NEGATIVE
Ketones, UA: NEGATIVE
Leukocytes, UA: NEGATIVE
pH, UA: 7
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
## 2012-04-28 NOTE — Patient Instructions (Addendum)
For leg - warm compresses.  Watch for worsening pain, spreading redness, or enlarging instead of decreasing in size. For urine - it doesn't seem like infection currently.  Continue flomax along with finasteride for the next month, then may try trial off flomax.  Let us know if not improving as expected, or any fevers/chills, worsening abdominal or back pain, or burning not resolving. Ensure staying well hydrated with 6-8 glasses of water a day.

## 2012-04-28 NOTE — Assessment & Plan Note (Signed)
Anticipate resolving hematoma. As improving, continue to monitor. Discussed red flags to watch out for. rec warm compresses

## 2012-04-28 NOTE — Assessment & Plan Note (Signed)
Some concerning sxs of infection, however UA/micro reassuring. For now, recommended continue flomax as this has been helping sxs. If not improving as expected, to update [place] for further evaluation.

## 2012-04-30 ENCOUNTER — Encounter: Payer: Self-pay | Admitting: Family Medicine

## 2012-04-30 ENCOUNTER — Ambulatory Visit (INDEPENDENT_AMBULATORY_CARE_PROVIDER_SITE_OTHER): Payer: Medicare Other | Admitting: Family Medicine

## 2012-04-30 VITALS — BP 132/77 | HR 68 | Temp 97.8°F | Ht 71.0 in | Wt 168.8 lb

## 2012-04-30 DIAGNOSIS — R300 Dysuria: Secondary | ICD-10-CM | POA: Insufficient documentation

## 2012-04-30 DIAGNOSIS — N419 Inflammatory disease of prostate, unspecified: Secondary | ICD-10-CM

## 2012-04-30 LAB — POCT URINALYSIS DIPSTICK
Bilirubin, UA: NEGATIVE
Glucose, UA: NEGATIVE
Leukocytes, UA: NEGATIVE
Nitrite, UA: NEGATIVE

## 2012-04-30 MED ORDER — CIPROFLOXACIN HCL 500 MG PO TABS: 500.0000 mg | ORAL_TABLET | Freq: Two times a day (BID) | ORAL | Status: DC

## 2012-04-30 NOTE — Assessment & Plan Note (Addendum)
Boggy prostate with discomfort on exam  Symptom of dysuria and frequency ua is normal  tx with cipro  And update after 10 day course  Update if not starting to improve in a week or if worsening

## 2012-04-30 NOTE — Progress Notes (Signed)
Subjective:    Patient ID: [name], male    DOB: 12-24-1932, 78 y.o.   MRN: [id number]  HPI Thinks he has a urinary infection  Was here on 7/22- ua was normal  Symptoms are worse now   Burns badly when he urinates  Going frequently  No blood in urine  No fever or n/v  Is back on flomax and this helps him pass urine   This all started after starting flomax and then stopped it and re started it   No chance of STD at all   Patient Active Problem List  Diagnosis  . LIPOMA  . HYPERLIPIDEMIA  . HYPERTENSION  . ATRIAL FIBRILLATION  . SINOATRIAL NODE DYSFUNCTION  . BRADYCARDIA  . ALLERGIC RHINITIS  . OTHER CONSTIPATION  . HEPATIC CYST  . HYPERTROPHY PROSTATE W/UR OBST & OTH LUTS  . ACTINIC KERATOSIS  . OSTEOARTHRITIS  . ECHOCARDIOGRAM, ABNORMAL  . CARDIAC PACEMAKER IN SITU  . Routine general medical examination at a health care facility  . Calf pain  . Urinary frequency   Past Medical History  Diagnosis Date  . Hyperlipemia   . Hypertension   . Osteoarthritis   . BPH (benign prostatic hyperplasia)   . Allergic rhinitis   . Atrial fibrillation or flutter 12/10    Has been paroxysmal. Patient has not tolerated coumadin due to headaches. ECHO (12/10) Mild LVH, EF 55%,   . Sick sinus syndrome     S/P dual chamber Medtronic PCM  . Chest pain 12/10    Lexiscan myoview with EF 55%, no ischemia or infarction   Past Surgical History  Procedure Date  . Vasectomy in his 72's  . Dual chamber pacemaker 01/11    Dr. Ladona Ridgel   History  Substance Use Topics  . Smoking status: Former Games developer  . Smokeless tobacco: Never Used   Comment: In [employer], quit in 1960's  . Alcohol Use: No   Family History  Problem Relation Age of Onset  . Hypertension Mother   . Alcohol abuse Father    No Known Allergies Current Outpatient Prescriptions on File Prior to Visit  Medication Sig Dispense Refill  . ALPRAZolam (XANAX) 0.25 MG tablet Take 1 tablet (0.25 mg total) by mouth 2 (two)  times daily as needed for anxiety.  60 tablet  0  . aspirin EC 325 MG EC tablet Take 325 mg by mouth daily.        [name] diltiazem (TIAZAC) 360 MG 24 hr capsule Take 1 capsule (360 mg total) by mouth daily.  30 capsule  11  . finasteride (PROSCAR) 5 MG tablet Take 1 tablet (5 mg total) by mouth daily.  30 tablet  11  . flecainide (TAMBOCOR) 50 MG tablet TAKE ONE TABLET BY MOUTH TWICE DAILY  60 tablet  10  . Glucosamine 500 MG CAPS Take by mouth daily.        [name] ibuprofen (ADVIL,MOTRIN) 200 MG tablet Take 200 mg by mouth every 6 (six) hours as needed.        . Tamsulosin HCl (FLOMAX) 0.4 MG CAPS Take 0.4 mg by mouth daily.          Review of Systems Review of Systems  Constitutional: Negative for fever, appetite change, fatigue and unexpected weight change.  Eyes: Negative for pain and visual disturbance.  Respiratory: Negative for cough and shortness of breath.   Cardiovascular: Negative for cp or palpitations    Gastrointestinal: Negative for nausea, diarrhea and constipation.  Genitourinary:  pos for dysuria and urgency and frequency , neg for exp to STD or penile discharge, neg for bladder or rectal pain  Skin: Negative for pallor or rash   Neurological: Negative for weakness, light-headedness, numbness and headaches.  Hematological: Negative for adenopathy. Does not bruise/bleed easily.  Psychiatric/Behavioral: Negative for dysphoric mood. The patient is not nervous/anxious.         Objective:   Physical Exam  Constitutional: He appears well-developed and well-nourished.  HENT:  Head: Normocephalic and atraumatic.  Eyes: Conjunctivae and EOM are normal. Pupils are equal, round, and reactive to light.  Neck: Normal range of motion. Neck supple.  Cardiovascular: Normal rate.   Pulmonary/Chest: Effort normal and breath sounds normal.  Abdominal: Soft. Bowel sounds are normal. He exhibits no distension and no mass. There is no tenderness.       No suprapubic tenderness   No cva  tenderness  Genitourinary: Rectal exam shows no mass and anal tone normal. Prostate is tender.       Prostate is mildly enlarged , boggy and tender today   Musculoskeletal: He exhibits no edema.  Lymphadenopathy:    He has no cervical adenopathy.  Neurological: He is alert.  Skin: Skin is warm and dry. No rash noted.  Psychiatric: He has a normal mood and affect.          Assessment & Plan:

## 2012-04-30 NOTE — Patient Instructions (Addendum)
I think you have a prostate infection Leave a urine specimen on the way out Take cipro as directed Update if not starting to improve in a week or if worsening   Keep drinking your water

## 2012-05-05 ENCOUNTER — Encounter: Payer: Self-pay | Admitting: Internal Medicine

## 2012-05-05 ENCOUNTER — Ambulatory Visit (INDEPENDENT_AMBULATORY_CARE_PROVIDER_SITE_OTHER): Payer: Medicare Other | Admitting: Internal Medicine

## 2012-05-05 VITALS — BP 132/80 | HR 65 | Temp 98.0°F | Ht 71.0 in | Wt 169.0 lb

## 2012-05-05 DIAGNOSIS — N419 Inflammatory disease of prostate, unspecified: Secondary | ICD-10-CM

## 2012-05-05 MED ORDER — DOXYCYCLINE HYCLATE 100 MG PO TABS: 100.0000 mg | ORAL_TABLET | Freq: Two times a day (BID) | ORAL | Status: AC

## 2012-05-05 NOTE — Patient Instructions (Signed)
Stop the cipro Restart the tamsulosin Take the new antibiotic---doxycycline. If you are not better within the next week, call for referral to a urologist

## 2012-05-05 NOTE — Assessment & Plan Note (Signed)
Ongoing symptoms despite the cipro Will change to doxy Go back on the tamsulosin May need urology eval
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
## 2012-06-04 ENCOUNTER — Encounter: Payer: Self-pay | Admitting: *Deleted

## 2012-06-11 ENCOUNTER — Ambulatory Visit (INDEPENDENT_AMBULATORY_CARE_PROVIDER_SITE_OTHER): Payer: Medicare Other | Admitting: *Deleted

## 2012-06-11 ENCOUNTER — Telehealth: Payer: Self-pay | Admitting: Internal Medicine

## 2012-06-11 ENCOUNTER — Encounter: Payer: Self-pay | Admitting: Internal Medicine

## 2012-06-11 DIAGNOSIS — I4891 Unspecified atrial fibrillation: Secondary | ICD-10-CM

## 2012-06-11 NOTE — Telephone Encounter (Signed)
Pt sent transmission and transmission was received. LMOM to let know transmission was received.

## 2012-06-11 NOTE — Telephone Encounter (Signed)
New Problem:    Patient is still having issues trying to figure out when and how to wirelessly transmit.  Please call back.

## 2012-06-11 NOTE — Telephone Encounter (Signed)
LMOM in regards to sending transmissions/kwb

## 2012-06-13 LAB — REMOTE PACEMAKER DEVICE
AL IMPEDENCE PM: 554 Ohm
AL THRESHOLD: 1 V
ATRIAL PACING PM: 95
BAMS-0001: 175 {beats}/min
RV LEAD AMPLITUDE: 11.2 mv
RV LEAD IMPEDENCE PM: 590 Ohm
RV LEAD THRESHOLD: 0.875 V

## 2012-07-02 ENCOUNTER — Encounter: Payer: Self-pay | Admitting: *Deleted

## 2012-07-30 ENCOUNTER — Other Ambulatory Visit: Payer: Self-pay | Admitting: Internal Medicine

## 2012-08-01 ENCOUNTER — Other Ambulatory Visit: Payer: Self-pay | Admitting: Family Medicine

## 2012-08-01 MED ORDER — ALPRAZOLAM 0.25 MG PO TABS: 0.2500 mg | ORAL_TABLET | Freq: Two times a day (BID) | ORAL | Status: DC | PRN

## 2012-08-01 NOTE — Telephone Encounter (Signed)
[facility] faxed RX refill request for Alprazolam 0.25mg .  Please advise.

## 2012-08-01 NOTE — Telephone Encounter (Signed)
Okay #60 x 0 

## 2012-08-01 NOTE — Telephone Encounter (Signed)
rx called into pharmacy

## 2012-09-15 ENCOUNTER — Encounter: Payer: Medicare Other | Admitting: *Deleted

## 2012-09-25 ENCOUNTER — Encounter: Payer: Self-pay | Admitting: *Deleted

## 2012-10-03 ENCOUNTER — Ambulatory Visit (INDEPENDENT_AMBULATORY_CARE_PROVIDER_SITE_OTHER): Payer: Medicare Other | Admitting: *Deleted

## 2012-10-03 ENCOUNTER — Encounter: Payer: Self-pay | Admitting: Internal Medicine

## 2012-10-03 DIAGNOSIS — Z950 Presence of cardiac pacemaker: Secondary | ICD-10-CM

## 2012-10-03 DIAGNOSIS — I498 Other specified cardiac arrhythmias: Secondary | ICD-10-CM

## 2012-10-06 LAB — REMOTE PACEMAKER DEVICE
AL THRESHOLD: 1 V
BAMS-0001: 175 {beats}/min
RV LEAD AMPLITUDE: 8 mv
RV LEAD IMPEDENCE PM: 564 Ohm
RV LEAD THRESHOLD: 0.75 V
VENTRICULAR PACING PM: 0

## 2012-10-08 HISTORY — PX: LAPAROSCOPIC CHOLECYSTECTOMY: SUR755

## 2012-10-29 ENCOUNTER — Ambulatory Visit (INDEPENDENT_AMBULATORY_CARE_PROVIDER_SITE_OTHER): Payer: Medicare Other | Admitting: Internal Medicine

## 2012-10-29 ENCOUNTER — Encounter: Payer: Self-pay | Admitting: Internal Medicine

## 2012-10-29 VITALS — BP 158/90 | HR 82 | Wt 176.0 lb

## 2012-10-29 DIAGNOSIS — M791 Myalgia, unspecified site: Secondary | ICD-10-CM

## 2012-10-29 DIAGNOSIS — R1011 Right upper quadrant pain: Secondary | ICD-10-CM

## 2012-10-29 DIAGNOSIS — IMO0001 Reserved for inherently not codable concepts without codable children: Secondary | ICD-10-CM

## 2012-10-29 NOTE — Assessment & Plan Note (Signed)
Fairly classic though still mild gallbladder symptoms Will check ultrasound Byrnett/Sankar if positive

## 2012-10-29 NOTE — Progress Notes (Signed)
Subjective:    Patient ID: [name], male    DOB: Sep 12, 1933, 79 y.o.   MRN: [id number]  HPI Both shoulders are aching Tries to lift arms and it hurts Happens suddenly  Got high dose flu shot about 9 days ago Noticed a lot of pain playing golf No problems before the shot  Having some low back pain also  Uses ibuprofen 400mg  every 4 hours and this was helpful  Also has some pain up under right ribs Occurs after eating Variable duration to several hours Some nausea with this Goes back 4-5 months  Current Outpatient Prescriptions on File Prior to Visit  Medication Sig Dispense Refill  . ALPRAZolam (XANAX) 0.25 MG tablet Take 1 tablet (0.25 mg total) by mouth 2 (two) times daily as needed for anxiety.  60 tablet  0  . aspirin EC 325 MG EC tablet Take 325 mg by mouth daily.        [name] diltiazem (TIAZAC) 360 MG 24 hr capsule Take 1 capsule (360 mg total) by mouth daily.  30 capsule  11  . flecainide (TAMBOCOR) 50 MG tablet TAKE ONE TABLET BY MOUTH TWICE DAILY  60 tablet  10  . Glucosamine 500 MG CAPS Take by mouth daily.        [name] ibuprofen (ADVIL,MOTRIN) 200 MG tablet Take 200 mg by mouth every 6 (six) hours as needed.        . Tamsulosin HCl (FLOMAX) 0.4 MG CAPS TAKE ONE CAPSULE BY MOUTH EVERY DAY  30 capsule  11    No Known Allergies  Past Medical History  Diagnosis Date  . Hyperlipemia   . Hypertension   . Osteoarthritis   . BPH (benign prostatic hyperplasia)   . Allergic rhinitis   . Atrial fibrillation or flutter 12/10    Has been paroxysmal. Patient has not tolerated coumadin due to headaches. ECHO (12/10) Mild LVH, EF 55%,   . Sick sinus syndrome     S/P dual chamber Medtronic PCM  . Chest pain 12/10    Lexiscan myoview with EF 55%, no ischemia or infarction    Past Surgical History  Procedure Date  . Vasectomy in his 38's  . Dual chamber pacemaker 01/11    Dr. Ladona Ridgel    Family History  Problem Relation Age of Onset  . Hypertension Mother   .  Alcohol abuse Father     History   Social History  . Marital Status: Married    Spouse Name: N/A    Number of Children: 3  . Years of Education: N/A   Occupational History  . Retired     Animator   Social History Main Topics  . Smoking status: Former Games developer  . Smokeless tobacco: Never Used     Comment: In [employer], quit in 1960's  . Alcohol Use: No  . Drug Use: No  . Sexually Active: Not on file   Other Topics Concern  . Not on file   Social History Narrative   2 Daughters, one son killed [facility] living willWife, then daughter, is health care POAWould accept resuscitation attempts but no prolonged life support.No tube feeding if cognitively unaware   Review of Systems No nausea, vomiting or diarrhea No cough or SOB Chronic head congestion-no change Appetite is fine    Objective:   Physical Exam  Constitutional: He appears well-developed and well-nourished. No distress.  Neck: Normal range of motion. Neck supple. No thyromegaly present.  Cardiovascular: Normal rate, regular  rhythm and normal heart sounds.  Exam reveals no gallop.   No murmur heard. Pulmonary/Chest: Effort normal and breath sounds normal. No respiratory distress. He has no wheezes. He has no rales.  Abdominal: Soft. Bowel sounds are normal. There is tenderness.       Very mildly positive Murphy's sign  Musculoskeletal: He exhibits no edema.       Diffuse muscle tenderness but no joint tenderness  Lymphadenopathy:    He has no cervical adenopathy.  Neurological:       No weakness          Assessment & Plan:

## 2012-10-29 NOTE — Assessment & Plan Note (Signed)
Clearly seems to be a reaction to the high dose flu vaccine Reassured--this should resolve on its own

## 2012-10-31 ENCOUNTER — Encounter: Payer: Self-pay | Admitting: Internal Medicine

## 2012-10-31 ENCOUNTER — Ambulatory Visit: Payer: Self-pay | Admitting: Internal Medicine

## 2012-11-01 ENCOUNTER — Telehealth: Payer: Self-pay | Admitting: Internal Medicine

## 2012-11-01 DIAGNOSIS — K8020 Calculus of gallbladder without cholecystitis without obstruction: Secondary | ICD-10-CM | POA: Insufficient documentation

## 2012-11-01 NOTE — Telephone Encounter (Signed)
Please call him The ultrasound does show gallstones so I think he should go ahead and see the surgeon---who will probably plan to take out his gallbladder  Set up with Byrnett or Evette Cristal

## 2012-11-03 NOTE — Telephone Encounter (Signed)
Appt made with Dr Lemar Livings and patient notified.

## 2012-11-06 ENCOUNTER — Ambulatory Visit: Payer: Self-pay | Admitting: General Surgery

## 2012-11-06 DIAGNOSIS — I499 Cardiac arrhythmia, unspecified: Secondary | ICD-10-CM

## 2012-11-06 LAB — COMPREHENSIVE METABOLIC PANEL
Albumin: 4.1 g/dL (ref 3.4–5.0)
Alkaline Phosphatase: 80 U/L (ref 50–136)
Anion Gap: 7 (ref 7–16)
Calcium, Total: 8.8 mg/dL (ref 8.5–10.1)
Co2: 24 mmol/L (ref 21–32)
EGFR (Non-African Amer.): 59 — ABNORMAL LOW
Glucose: 92 mg/dL (ref 65–99)
Osmolality: 282 (ref 275–301)
Potassium: 4.1 mmol/L (ref 3.5–5.1)
Total Protein: 8.1 g/dL (ref 6.4–8.2)

## 2012-11-06 LAB — CBC WITH DIFFERENTIAL/PLATELET
Basophil %: 0.9 %
HGB: 14.3 g/dL (ref 13.0–18.0)
Lymphocyte #: 1.8 10*3/uL (ref 1.0–3.6)
Lymphocyte %: 26.4 %
MCHC: 34.1 g/dL (ref 32.0–36.0)
MCV: 91 fL (ref 80–100)
Monocyte #: 0.6 x10 3/mm (ref 0.2–1.0)
Neutrophil #: 4.4 10*3/uL (ref 1.4–6.5)
Neutrophil %: 63.3 %
Platelet: 248 10*3/uL (ref 150–440)
RBC: 4.59 10*6/uL (ref 4.40–5.90)
RDW: 13.3 % (ref 11.5–14.5)

## 2012-11-06 NOTE — Telephone Encounter (Signed)
noted 
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
## 2012-11-07 ENCOUNTER — Emergency Department: Payer: Self-pay | Admitting: Emergency Medicine

## 2012-11-07 ENCOUNTER — Ambulatory Visit: Payer: Self-pay | Admitting: General Surgery

## 2012-11-07 LAB — URINALYSIS, COMPLETE
Bilirubin,UR: NEGATIVE
Blood: NEGATIVE
Glucose,UR: NEGATIVE mg/dL (ref 0–75)
Ketone: NEGATIVE
Leukocyte Esterase: NEGATIVE
Nitrite: NEGATIVE
Squamous Epithelial: NONE SEEN
WBC UR: 1 /HPF (ref 0–5)

## 2012-11-08 ENCOUNTER — Emergency Department: Payer: Self-pay | Admitting: Emergency Medicine

## 2012-11-11 ENCOUNTER — Encounter: Payer: Self-pay | Admitting: Internal Medicine

## 2012-11-12 LAB — PATHOLOGY REPORT

## 2012-11-17 ENCOUNTER — Encounter: Payer: Self-pay | Admitting: Family Medicine

## 2012-11-17 ENCOUNTER — Telehealth: Payer: Self-pay

## 2012-11-17 ENCOUNTER — Ambulatory Visit (INDEPENDENT_AMBULATORY_CARE_PROVIDER_SITE_OTHER): Payer: Medicare Other | Admitting: Family Medicine

## 2012-11-17 VITALS — BP 148/84 | HR 68 | Temp 98.0°F | Wt 173.2 lb

## 2012-11-17 DIAGNOSIS — M25512 Pain in left shoulder: Secondary | ICD-10-CM | POA: Insufficient documentation

## 2012-11-17 DIAGNOSIS — M25519 Pain in unspecified shoulder: Secondary | ICD-10-CM

## 2012-11-17 DIAGNOSIS — T2000XA Burn of unspecified degree of head, face, and neck, unspecified site, initial encounter: Secondary | ICD-10-CM | POA: Insufficient documentation
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
## 2012-11-17 NOTE — Assessment & Plan Note (Signed)
Anticipate L RTC tendonitis - specifically supraspinatus. Treat with NSAID and stretching exercises provided along with resistance band from [facility] patient advisor. Update if not improved as expected
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
## 2012-11-17 NOTE — Telephone Encounter (Signed)
Seen today. 
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
## 2012-11-18 ENCOUNTER — Ambulatory Visit: Payer: Medicare Other | Admitting: Internal Medicine

## 2012-11-18 ENCOUNTER — Ambulatory Visit: Payer: Medicare Other | Admitting: Family Medicine

## 2012-11-25 ENCOUNTER — Encounter: Payer: Medicare Other | Admitting: Internal Medicine

## 2012-11-27 ENCOUNTER — Encounter: Payer: Self-pay | Admitting: *Deleted

## 2012-12-04 ENCOUNTER — Other Ambulatory Visit: Payer: Self-pay | Admitting: Internal Medicine

## 2012-12-04 NOTE — Telephone Encounter (Signed)
That should be filled by Dr Ladona Ridgel

## 2012-12-05 NOTE — Telephone Encounter (Signed)
Tried calling home number, but no answer and no answering machine, will try again later

## 2012-12-10 ENCOUNTER — Other Ambulatory Visit: Payer: Self-pay | Admitting: Internal Medicine

## 2012-12-16 ENCOUNTER — Encounter: Payer: Medicare Other | Admitting: Internal Medicine

## 2013-01-09 ENCOUNTER — Other Ambulatory Visit: Payer: Self-pay | Admitting: *Deleted

## 2013-01-09 ENCOUNTER — Other Ambulatory Visit: Payer: Self-pay | Admitting: Internal Medicine

## 2013-01-09 NOTE — Telephone Encounter (Signed)
Opened in error

## 2013-01-15 ENCOUNTER — Encounter: Payer: Medicare Other | Admitting: Internal Medicine

## 2013-02-08 ENCOUNTER — Other Ambulatory Visit: Payer: Self-pay | Admitting: Internal Medicine

## 2013-02-09 ENCOUNTER — Other Ambulatory Visit: Payer: Self-pay | Admitting: *Deleted

## 2013-02-09 ENCOUNTER — Ambulatory Visit (INDEPENDENT_AMBULATORY_CARE_PROVIDER_SITE_OTHER): Payer: Medicare Other | Admitting: Internal Medicine

## 2013-02-09 ENCOUNTER — Encounter: Payer: Self-pay | Admitting: Internal Medicine

## 2013-02-09 VITALS — BP 140/76 | HR 68 | Temp 97.9°F | Wt 173.5 lb

## 2013-02-09 DIAGNOSIS — K219 Gastro-esophageal reflux disease without esophagitis: Secondary | ICD-10-CM | POA: Insufficient documentation

## 2013-02-09 NOTE — Assessment & Plan Note (Signed)
Only since gallbladder surgery Happens after big meal in evening Prevented by ranitidine Discussed he probably just needs to take this

## 2013-02-09 NOTE — Progress Notes (Signed)
Subjective:    Patient ID: [name], male    DOB: 24-Jan-1933, 79 y.o.   MRN: [id number]  HPI Having indigestion every night since he had gallbladder out--after evening meal Gets burning sensation substernal and nausea type sensation Zantac before eating has helped Never had heartburn before Some pain under right rib cage also--intermittent  No swallowing problems No change in voice Current Outpatient Prescriptions on File Prior to Visit  Medication Sig Dispense Refill  . ALPRAZolam (XANAX) 0.25 MG tablet Take 1 tablet (0.25 mg total) by mouth 2 (two) times daily as needed for anxiety.  60 tablet  0  . aspirin EC 325 MG EC tablet Take 325 mg by mouth daily.        [name] diltiazem (TIAZAC) 360 MG 24 hr capsule TAKE ONE CAPSULE BY MOUTH EVERY DAY  30 capsule  3  . flecainide (TAMBOCOR) 50 MG tablet TAKE ONE TABLET BY MOUTH TWICE DAILY  60 tablet  0  . Glucosamine 500 MG CAPS Take by mouth daily.        [name] ibuprofen (ADVIL,MOTRIN) 200 MG tablet Take 200 mg by mouth every 6 (six) hours as needed.        . Tamsulosin HCl (FLOMAX) 0.4 MG CAPS TAKE ONE CAPSULE BY MOUTH EVERY DAY  30 capsule  11  . silver sulfADIAZINE (SILVADENE) 1 % cream Apply 1 application topically 2 (two) times daily.       No current facility-administered medications on file prior to visit.    No Known Allergies  Past Medical History  Diagnosis Date  . Hyperlipemia   . Hypertension   . Osteoarthritis   . BPH (benign prostatic hyperplasia)   . Allergic rhinitis   . Atrial fibrillation or flutter 12/10    Has been paroxysmal. Patient has not tolerated coumadin due to headaches. ECHO (12/10) Mild LVH, EF 55%,   . Sick sinus syndrome     S/P dual chamber Medtronic PCM  . Chest pain 12/10    Lexiscan myoview with EF 55%, no ischemia or infarction    Past Surgical History  Procedure Laterality Date  . Vasectomy  in his 74's  . Dual chamber pacemaker  01/11    Dr. Ladona Ridgel  . Laparoscopic cholecystectomy  1/14   Dr Lemar Livings    Family History  Problem Relation Age of Onset  . Hypertension Mother   . Alcohol abuse Father     History   Social History  . Marital Status: Married    Spouse Name: N/A    Number of Children: 3  . Years of Education: N/A   Occupational History  . Retired     Animator   Social History Main Topics  . Smoking status: Former Games developer  . Smokeless tobacco: Never Used     Comment: In [employer], quit in 1960's  . Alcohol Use: No  . Drug Use: No  . Sexually Active: Not on file   Other Topics Concern  . Not on file   Social History Narrative   2 Daughters, one son killed in MVA   Has living will   Wife, then daughter, is health care POA   Would accept resuscitation attempts but no prolonged life support.   No tube feeding if cognitively unaware   Review of Systems Chronic constipation--takes MOM every 3 days or so Appetite is fine Weight is stable    Objective:   Physical Exam  Constitutional: He appears well-developed and well-nourished. No distress.  Neck:  Normal range of motion. Neck supple. No thyromegaly present.  Pulmonary/Chest: Effort normal and breath sounds normal. No respiratory distress. He has no wheezes. He has no rales.  Abdominal: Soft. He exhibits no distension and no mass. There is no tenderness. There is no rebound and no guarding.  Musculoskeletal: He exhibits no edema and no tenderness.  Lymphadenopathy:    He has no cervical adenopathy.          Assessment & Plan:

## 2013-02-19 ENCOUNTER — Encounter: Payer: Self-pay | Admitting: Internal Medicine

## 2013-02-19 ENCOUNTER — Ambulatory Visit (INDEPENDENT_AMBULATORY_CARE_PROVIDER_SITE_OTHER): Payer: Medicare Other | Admitting: Internal Medicine

## 2013-02-19 VITALS — BP 120/70 | HR 76 | Ht 71.0 in | Wt 172.0 lb

## 2013-02-19 DIAGNOSIS — I4891 Unspecified atrial fibrillation: Secondary | ICD-10-CM

## 2013-02-19 DIAGNOSIS — I10 Essential (primary) hypertension: Secondary | ICD-10-CM

## 2013-02-19 DIAGNOSIS — I495 Sick sinus syndrome: Secondary | ICD-10-CM

## 2013-02-19 DIAGNOSIS — Z950 Presence of cardiac pacemaker: Secondary | ICD-10-CM

## 2013-02-19 LAB — PACEMAKER DEVICE OBSERVATION
BAMS-0001: 175 {beats}/min
BATTERY VOLTAGE: 2.8 V
RV LEAD AMPLITUDE: 4 mv
RV LEAD THRESHOLD: 0.75 V
VENTRICULAR PACING PM: 0

## 2013-02-19 MED ORDER — FLECAINIDE ACETATE 50 MG PO TABS: ORAL_TABLET | ORAL | Status: DC

## 2013-02-19 NOTE — Patient Instructions (Addendum)
Your physician wants you to follow-up in: 05/25/13 for [facility] transmission and 1 year with Dr. Court Joy will receive a reminder letter in the mail two months in advance. If you don't receive a letter, please call our office to schedule the follow-up appointment.

## 2013-02-19 NOTE — Assessment & Plan Note (Signed)
His Medtronic dual-chamber pacemaker is working normally. He has over 10 years of battery longevity.

## 2013-02-19 NOTE — Assessment & Plan Note (Signed)
He is maintaining sinus rhythm greater than 99% of the time, based on pacemaker interrogation. He remains on aspirin. I continue to recommend anticoagulation, but the patient is unwilling to stop taking aspirin in favor of a more powerful agent. He understands that he has a risk of stroke on aspirin alone.

## 2013-02-19 NOTE — Assessment & Plan Note (Signed)
His blood pressure remains very well controlled. No change in medical therapy.

## 2013-02-19 NOTE — Progress Notes (Signed)
HPI [name] returns today for followup. He is a very pleasant 79 year old man who looks younger than his stated age, with a history of sinus bradycardia, status post pacemaker insertion, paroxysmal atrial fibrillation, and hypertension. The patient has done well in the interim. He denies chest pain, shortness of breath, or syncope. He remains active, playing golf, and working on his property. The patient has had no neurologic problems. He continues to refuse systemic anticoagulation. No Known Allergies   Current Outpatient Prescriptions  Medication Sig Dispense Refill  . ALPRAZolam (XANAX) 0.25 MG tablet Take 1 tablet (0.25 mg total) by mouth 2 (two) times daily as needed for anxiety.  60 tablet  0  . aspirin EC 325 MG EC tablet Take 325 mg by mouth daily.        [name] diltiazem (TIAZAC) 360 MG 24 hr capsule TAKE ONE CAPSULE BY MOUTH EVERY DAY  30 capsule  3  . flecainide (TAMBOCOR) 50 MG tablet TAKE ONE TABLET BY MOUTH TWICE DAILY  60 tablet  0  . Glucosamine 500 MG CAPS Take by mouth daily.        [name] ibuprofen (ADVIL,MOTRIN) 200 MG tablet Take 200 mg by mouth every 6 (six) hours as needed.        . ranitidine (ZANTAC) 150 MG tablet Take 150 mg by mouth daily. Before main meal      . silver sulfADIAZINE (SILVADENE) 1 % cream Apply 1 application topically 2 (two) times daily.      . Tamsulosin HCl (FLOMAX) 0.4 MG CAPS TAKE ONE CAPSULE BY MOUTH EVERY DAY  30 capsule  11   No current facility-administered medications for this visit.     Past Medical History  Diagnosis Date  . Hyperlipemia   . Hypertension   . Osteoarthritis   . BPH (benign prostatic hyperplasia)   . Allergic rhinitis   . Atrial fibrillation or flutter 12/10    Has been paroxysmal. Patient has not tolerated coumadin due to headaches. ECHO (12/10) Mild LVH, EF 55%,   . Sick sinus syndrome     S/P dual chamber Medtronic PCM  . Chest pain 12/10    Lexiscan myoview with EF 55%, no ischemia or infarction    ROS:   All  systems reviewed and negative except as noted in the HPI.   Past Surgical History  Procedure Laterality Date  . Vasectomy  in his 46's  . Dual chamber pacemaker  01/11    Dr. Ladona Ridgel  . Laparoscopic cholecystectomy  1/14    Dr Lemar Livings     Family History  Problem Relation Age of Onset  . Hypertension Mother   . Alcohol abuse Father      History   Social History  . Marital Status: Married    Spouse Name: N/A    Number of Children: 3  . Years of Education: N/A   Occupational History  . Retired     Animator   Social History Main Topics  . Smoking status: Former Games developer  . Smokeless tobacco: Never Used     Comment: In [employer], quit in 1960's  . Alcohol Use: No  . Drug Use: No  . Sexually Active: Not on file   Other Topics Concern  . Not on file   Social History Narrative   2 Daughters, one son killed in MVA   Has living will   Wife, then daughter, is health care POA   Would accept resuscitation attempts but no prolonged life support.  No tube feeding if cognitively unaware     BP 120/70  Pulse 76  Ht 5\' 11"  (1.803 m)  Wt 172 lb (78.019 kg)  BMI 24 kg/m2  Physical Exam:  Well appearing 79 year old man,NAD HEENT: Unremarkable Neck:  7 cm JVD, no thyromegally Back:  No CVA tenderness Lungs:  Clear with no wheezes, rales, or rhonchi. HEART:  Regular rate rhythm, no murmurs, no rubs, no clicks Abd:  soft, positive bowel sounds, no organomegally, no rebound, no guarding Ext:  2 plus pulses, no edema, no cyanosis, no clubbing Skin:  No rashes no nodules Neuro:  CN II through XII intact, motor grossly intact  DEVICE  Normal device function.  See PaceArt for details.   Assess/Plan:

## 2013-04-23 ENCOUNTER — Other Ambulatory Visit: Payer: Self-pay | Admitting: *Deleted

## 2013-04-23 MED ORDER — ALPRAZOLAM 0.25 MG PO TABS: 0.2500 mg | ORAL_TABLET | Freq: Two times a day (BID) | ORAL | Status: DC | PRN

## 2013-04-23 NOTE — Telephone Encounter (Signed)
rx called into pharmacy

## 2013-04-23 NOTE — Telephone Encounter (Signed)
Okay #60 x 0 

## 2013-04-23 NOTE — Telephone Encounter (Signed)
Last filled 08/01/13

## 2013-04-30 ENCOUNTER — Other Ambulatory Visit: Payer: Self-pay | Admitting: *Deleted

## 2013-04-30 MED ORDER — DILTIAZEM HCL ER BEADS 360 MG PO CP24: ORAL_CAPSULE | ORAL | Status: DC

## 2013-05-25 ENCOUNTER — Encounter: Payer: Medicare Other | Admitting: *Deleted

## 2013-06-04 ENCOUNTER — Encounter: Payer: Self-pay | Admitting: *Deleted

## 2013-08-12 ENCOUNTER — Encounter: Payer: Medicare Other | Admitting: Internal Medicine

## 2013-08-16 ENCOUNTER — Other Ambulatory Visit: Payer: Self-pay | Admitting: Internal Medicine

## 2013-08-26 ENCOUNTER — Ambulatory Visit (INDEPENDENT_AMBULATORY_CARE_PROVIDER_SITE_OTHER): Payer: Medicare Other

## 2013-08-26 DIAGNOSIS — Z23 Encounter for immunization: Secondary | ICD-10-CM

## 2013-09-30 ENCOUNTER — Other Ambulatory Visit: Payer: Self-pay | Admitting: *Deleted

## 2013-09-30 MED ORDER — DILTIAZEM HCL ER BEADS 360 MG PO CP24: ORAL_CAPSULE | ORAL | Status: DC

## 2013-10-12 ENCOUNTER — Ambulatory Visit (INDEPENDENT_AMBULATORY_CARE_PROVIDER_SITE_OTHER): Payer: Medicare Other | Admitting: *Deleted

## 2013-10-12 DIAGNOSIS — I4891 Unspecified atrial fibrillation: Secondary | ICD-10-CM

## 2013-10-12 DIAGNOSIS — I495 Sick sinus syndrome: Secondary | ICD-10-CM

## 2013-10-12 LAB — MDC_IDC_ENUM_SESS_TYPE_INCLINIC
Battery Remaining Longevity: 137 mo
Brady Statistic AP VP Percent: 0 %
Brady Statistic AP VS Percent: 97 %
Brady Statistic AS VP Percent: 0 %
Date Time Interrogation Session: 20150105103415
Lead Channel Impedance Value: 529 Ohm
Lead Channel Pacing Threshold Pulse Width: 0.4 ms
Lead Channel Sensing Intrinsic Amplitude: 2 mV
Lead Channel Sensing Intrinsic Amplitude: 4 mV
Lead Channel Setting Pacing Amplitude: 2 V
Lead Channel Setting Pacing Pulse Width: 0.4 ms
Lead Channel Setting Sensing Sensitivity: 2 mV
MDC IDC MSMT BATTERY IMPEDANCE: 181 Ohm
MDC IDC MSMT BATTERY VOLTAGE: 2.8 V
MDC IDC MSMT LEADCHNL RA PACING THRESHOLD AMPLITUDE: 0.75 V
MDC IDC MSMT LEADCHNL RA PACING THRESHOLD PULSEWIDTH: 0.4 ms
MDC IDC MSMT LEADCHNL RV IMPEDANCE VALUE: 540 Ohm
MDC IDC MSMT LEADCHNL RV PACING THRESHOLD AMPLITUDE: 0.75 V
MDC IDC SET LEADCHNL RV PACING AMPLITUDE: 2 V
MDC IDC STAT BRADY AS VS PERCENT: 3 %

## 2013-10-12 NOTE — Progress Notes (Signed)
PPM check in office. 

## 2013-10-20 ENCOUNTER — Ambulatory Visit (INDEPENDENT_AMBULATORY_CARE_PROVIDER_SITE_OTHER): Payer: Medicare Other | Admitting: Internal Medicine

## 2013-10-20 ENCOUNTER — Encounter: Payer: Self-pay | Admitting: Internal Medicine

## 2013-10-20 VITALS — BP 160/90 | HR 70 | Ht 71.0 in | Wt 177.8 lb

## 2013-10-20 DIAGNOSIS — I4891 Unspecified atrial fibrillation: Secondary | ICD-10-CM

## 2013-10-20 DIAGNOSIS — Z79899 Other long term (current) drug therapy: Secondary | ICD-10-CM

## 2013-10-20 DIAGNOSIS — R000 Tachycardia, unspecified: Secondary | ICD-10-CM

## 2013-10-20 DIAGNOSIS — I10 Essential (primary) hypertension: Secondary | ICD-10-CM

## 2013-10-20 MED ORDER — APIXABAN 5 MG PO TABS: 5.0000 mg | ORAL_TABLET | Freq: Two times a day (BID) | ORAL | Status: DC

## 2013-10-20 NOTE — Assessment & Plan Note (Signed)
Blood pressure is considerably higher than normal. We will follow it for now.

## 2013-10-20 NOTE — Patient Instructions (Addendum)
Your physician has recommended you make the following change in your medication:  Stop aspirin  Start Eliquis 5 mg twice a day   Your physician recommends that you schedule a follow-up appointment in: 4-5 weeks with Dr. Caryl Comes   Your physician recommends that you have lab work today:   Basic metabolic panel    [place]  Your caregiver has ordered a Stress Test with nuclear imaging. The purpose of this test is to evaluate the blood supply to your heart muscle. This procedure is referred to as a "Non-Invasive Stress Test." This is because other than having an IV started in your vein, nothing is inserted or "invades" your body. Cardiac stress tests are done to find areas of poor blood flow to the heart by determining the extent of coronary artery disease (CAD). Some patients exercise on a treadmill, which naturally increases the blood flow to your heart, while others who are  unable to walk on a treadmill due to physical limitations have a pharmacologic/chemical stress agent called Lexiscan . This medicine will mimic walking on a treadmill by temporarily increasing your coronary blood flow.   Please note: these test may take anywhere between 2-4 hours to complete  PLEASE REPORT TO [facility] AT THE FIRST DESK WILL DIRECT YOU WHERE TO GO  Date of Procedure:__________1/19/15___________________________  Arrival Time for Procedure:___________0945 am___________________  PLEASE NOTIFY THE OFFICE AT LEAST 24 HOURS IN ADVANCE IF YOU ARE UNABLE TO KEEP YOUR APPOINTMENT.  [phone] AND  PLEASE NOTIFY NUCLEAR MEDICINE AT [facility] AT LEAST 24 HOURS IN ADVANCE IF YOU ARE UNABLE TO KEEP YOUR APPOINTMENT. [phone]  How to prepare for your Myoview test:  1. Do not eat or drink after midnight 2. No caffeine for 24 hours prior to test 3. No smoking 24 hours prior to test. 4. Your medication may be taken with water.  If your doctor stopped a medication because of this  test, do not take that medication. 5. Ladies, please do not wear dresses.  Skirts or pants are appropriate. Please wear a short sleeve shirt. 6. No perfume, cologne or lotion. 7. Wear comfortable walking shoes. No heels!

## 2013-10-20 NOTE — Progress Notes (Signed)
      Patient Care Team: [name], MD as PCP - General   HPI  [name] is a 80 y.o. male Seen in followup for pacemaker implanted for sinus bradycardia. He has paroxysmal atrial fibrillation. Thromboembolic risk factors are notable for hypertension and age. He has in the past refused anticoagulation.  He has increasing sx of atrial fib although they be relatively rare they are associated with shortness of breath and palpitations. He uses aspirin as an antiplatelet agent. He takes extra ones when he feels his atrial fibrillation.  These episodes are also accompanied by chest discomfort.  otherwise no assoc exercise intolerance   Past Medical History  Diagnosis Date  . Hyperlipemia   . Hypertension   . Osteoarthritis   . BPH (benign prostatic hyperplasia)   . Allergic rhinitis   . Atrial fibrillation or flutter 12/10    Has been paroxysmal. Patient has not tolerated coumadin due to headaches. ECHO (12/10) Mild LVH, EF 55%,   . Sick sinus syndrome     S/P dual chamber Medtronic PCM  . Chest pain 12/10    Lexiscan myoview with EF 55%, no ischemia or infarction    Past Surgical History  Procedure Laterality Date  . Vasectomy  in his 57's  . Dual chamber pacemaker  01/11    Dr. Lovena Le  . Laparoscopic cholecystectomy  1/14    Dr Bary Castilla    Current Outpatient Prescriptions  Medication Sig Dispense Refill  . ALPRAZolam (XANAX) 0.25 MG tablet Take 1 tablet (0.25 mg total) by mouth 2 (two) times daily as needed for anxiety.  60 tablet  0  . aspirin EC 325 MG EC tablet Take 325 mg by mouth daily.        [name] diltiazem (TIAZAC) 360 MG 24 hr capsule TAKE ONE CAPSULE BY MOUTH EVERY DAY  30 capsule  5  . flecainide (TAMBOCOR) 50 MG tablet Take 1 and 1/2 tablets twice daily  180 tablet  3  . Glucosamine 500 MG CAPS Take by mouth daily.        [name] ibuprofen (ADVIL,MOTRIN) 200 MG tablet Take 200 mg by mouth every 6 (six) hours as needed.        . ranitidine (ZANTAC) 150 MG  tablet Take 150 mg by mouth daily. Before main meal      . tamsulosin (FLOMAX) 0.4 MG CAPS capsule TAKE ONE CAPSULE BY MOUTH EVERY DAY  30 capsule  10   No current facility-administered medications for this visit.    No Known Allergies  Review of Systems negative except from HPI and PMH  Physical Exam BP 160/90  Pulse 70  Ht 5\' 11"  (1.803 m)  Wt 177 lb 12 oz (80.627 kg)  BMI 24.80 kg/m2 Well developed and well nourished in no acute distress HENT normal E scleral and icterus clear Neck Supple JVP flat; carotids brisk and full Clear to ausculation  regular rate and rhythm, no murmurs gallops or rub Soft with active bowel sounds No clubbing cyanosis none Edema Alert and oriented, grossly normal motor and sensory function Skin Warm and Dry     Assessment and  Plan

## 2013-10-20 NOTE — Assessment & Plan Note (Addendum)
Symptomatic paroxysms of atrial fibrillation. They're relatively infrequent. I suggested that he take another dose of Cardizem when he has these spells.  He is on flecainide. It has been years since he had a stress test. We'll undertake a stress Myoview to exclude occult coronary disease given the use of a 1C antiarrhythmic.  We also discussed regarding anticoagulation and the risks and benefits  of aspirin versus Coumadin versus NOACs. He would like to begin on a NOAC. We'll start him on apixaban. His renal function 18 months ago was normal.

## 2013-10-21 ENCOUNTER — Ambulatory Visit (INDEPENDENT_AMBULATORY_CARE_PROVIDER_SITE_OTHER): Payer: Medicare Other | Admitting: Internal Medicine

## 2013-10-21 ENCOUNTER — Encounter: Payer: Self-pay | Admitting: Internal Medicine

## 2013-10-21 VITALS — BP 158/80 | HR 86 | Temp 98.1°F | Wt 178.0 lb

## 2013-10-21 DIAGNOSIS — M19012 Primary osteoarthritis, left shoulder: Secondary | ICD-10-CM | POA: Insufficient documentation

## 2013-10-21 DIAGNOSIS — M19019 Primary osteoarthritis, unspecified shoulder: Secondary | ICD-10-CM

## 2013-10-21 LAB — BASIC METABOLIC PANEL
BUN / CREAT RATIO: 17 (ref 10–22)
BUN: 18 mg/dL (ref 8–27)
CHLORIDE: 102 mmol/L (ref 97–108)
CO2: 22 mmol/L (ref 18–29)
Calcium: 9.5 mg/dL (ref 8.6–10.2)
Creatinine, Ser: 1.05 mg/dL (ref 0.76–1.27)
GFR calc non Af Amer: 68 mL/min/{1.73_m2} (ref >59)
GFR, EST AFRICAN AMERICAN: 78 mL/min/{1.73_m2} (ref >59)
Glucose: 93 mg/dL (ref 65–99)
Potassium: 4.4 mmol/L (ref 3.5–5.2)
Sodium: 142 mmol/L (ref 134–144)

## 2013-10-21 NOTE — Progress Notes (Signed)
Subjective:    Patient ID: [name], male    DOB: 11/30/1932, 80 y.o.   MRN: [id number]  HPI Having pain in his left shoulder since his flu shot Arm got black and blue--and had some swelling Got in November  Pain is in shoulder and down to proximal forearm if he twists arms Really has trouble putting his arm behind his head  No treatment other than rest for a while  Current Outpatient Prescriptions on File Prior to Visit  Medication Sig Dispense Refill  . ALPRAZolam (XANAX) 0.25 MG tablet Take 1 tablet (0.25 mg total) by mouth 2 (two) times daily as needed for anxiety.  60 tablet  0  . apixaban (ELIQUIS) 5 MG TABS tablet Take 1 tablet (5 mg total) by mouth 2 (two) times daily.  60 tablet  6  . diltiazem (TIAZAC) 360 MG 24 hr capsule TAKE ONE CAPSULE BY MOUTH EVERY DAY  30 capsule  5  . flecainide (TAMBOCOR) 50 MG tablet Take 1 and 1/2 tablets twice daily  180 tablet  3  . Glucosamine 500 MG CAPS Take by mouth daily.        [name] ibuprofen (ADVIL,MOTRIN) 200 MG tablet Take 200 mg by mouth every 6 (six) hours as needed.        . ranitidine (ZANTAC) 150 MG tablet Take 150 mg by mouth daily. Before main meal      . tamsulosin (FLOMAX) 0.4 MG CAPS capsule TAKE ONE CAPSULE BY MOUTH EVERY DAY  30 capsule  10   No current facility-administered medications on file prior to visit.    No Known Allergies  Past Medical History  Diagnosis Date  . Hyperlipemia   . Hypertension   . Osteoarthritis   . BPH (benign prostatic hyperplasia)   . Allergic rhinitis   . Atrial fibrillation or flutter 12/10    Has been paroxysmal. Patient has not tolerated coumadin due to headaches. ECHO (12/10) Mild LVH, EF 55%,   . Sick sinus syndrome     S/P dual chamber Medtronic PCM  . Chest pain 12/10    Lexiscan myoview with EF 55%, no ischemia or infarction    Past Surgical History  Procedure Laterality Date  . Vasectomy  in his 29's  . Dual chamber pacemaker  01/11    Dr. Lovena Le  . Laparoscopic  cholecystectomy  1/14    Dr Bary Castilla    Family History  Problem Relation Age of Onset  . Hypertension Mother   . Alcohol abuse Father     History   Social History  . Marital Status: Married    Spouse Name: N/A    Number of Children: 3  . Years of Education: N/A   Occupational History  . Retired     Hydrologist   Social History Main Topics  . Smoking status: Former Research scientist (life sciences)  . Smokeless tobacco: Never Used     Comment: In [employer], quit in 1960's  . Alcohol Use: No  . Drug Use: No  . Sexual Activity: Not on file   Other Topics Concern  . Not on file   Social History Narrative   2 Daughters, one son killed in [place]   Has living will   Wife, then daughter, is health care POA   Would accept resuscitation attempts but no prolonged life support.   No tube feeding if cognitively unaware   Review of Systems No trouble with other joints Just got the eliquis yesterday  Objective:   Physical Exam  Musculoskeletal:  No swelling in shoulder or arm Normal ROM at left elbow--some pain in shoulder with extension of elbow against resistance Marked crepitus of left shoulder Increased pain with full passive or active ROM-- esp ext rotation and full abduction No findings in arm          Assessment & Plan:

## 2013-10-21 NOTE — Patient Instructions (Signed)
Please try tylenol arthritis 650mg  up to 3 times a day. You can also try heat on your shoulder. If it gets worse, set up an appointment for a cortisone shot.

## 2013-10-21 NOTE — Progress Notes (Signed)
Pre-visit discussion using our clinic review tool. No additional management support is needed unless otherwise documented below in the visit note.  

## 2013-10-21 NOTE — Assessment & Plan Note (Signed)
Not related to the flu shot---though his immobility may have brought on increased symptoms Discussed heat and regular acetaminophen Avoid the ibuprofen

## 2013-10-26 ENCOUNTER — Ambulatory Visit: Payer: Self-pay | Admitting: Internal Medicine

## 2013-10-26 DIAGNOSIS — R079 Chest pain, unspecified: Secondary | ICD-10-CM

## 2013-10-28 ENCOUNTER — Telehealth: Payer: Self-pay | Admitting: *Deleted

## 2013-10-28 ENCOUNTER — Other Ambulatory Visit: Payer: Self-pay

## 2013-10-28 DIAGNOSIS — R000 Tachycardia, unspecified: Secondary | ICD-10-CM

## 2013-10-28 DIAGNOSIS — Z79899 Other long term (current) drug therapy: Secondary | ICD-10-CM

## 2013-10-28 DIAGNOSIS — I4891 Unspecified atrial fibrillation: Secondary | ICD-10-CM

## 2013-10-28 NOTE — Telephone Encounter (Signed)
prior auth for eliquis sent to [facility]

## 2013-10-29 NOTE — Telephone Encounter (Signed)
PA from [employer] for eliquis approved through 10/27/2014 PA # [id number]

## 2013-11-17 ENCOUNTER — Ambulatory Visit (INDEPENDENT_AMBULATORY_CARE_PROVIDER_SITE_OTHER): Payer: Medicare Other | Admitting: Internal Medicine

## 2013-11-17 ENCOUNTER — Encounter: Payer: Self-pay | Admitting: Internal Medicine

## 2013-11-17 VITALS — BP 165/77 | HR 68 | Ht 71.0 in | Wt 179.2 lb

## 2013-11-17 DIAGNOSIS — I4891 Unspecified atrial fibrillation: Secondary | ICD-10-CM

## 2013-11-17 DIAGNOSIS — Z950 Presence of cardiac pacemaker: Secondary | ICD-10-CM

## 2013-11-17 DIAGNOSIS — I10 Essential (primary) hypertension: Secondary | ICD-10-CM

## 2013-11-17 DIAGNOSIS — I495 Sick sinus syndrome: Secondary | ICD-10-CM

## 2013-11-17 LAB — MDC_IDC_ENUM_SESS_TYPE_INCLINIC
Battery Impedance: 206 Ohm
Battery Remaining Longevity: 131 mo
Battery Voltage: 2.8 V
Brady Statistic AS VP Percent: 0 %
Date Time Interrogation Session: 20150210103234
Lead Channel Impedance Value: 514 Ohm
Lead Channel Impedance Value: 549 Ohm
Lead Channel Setting Pacing Amplitude: 2 V
Lead Channel Setting Sensing Sensitivity: 2 mV
MDC IDC SET LEADCHNL RA PACING AMPLITUDE: 2 V
MDC IDC SET LEADCHNL RV PACING PULSEWIDTH: 0.4 ms
MDC IDC STAT BRADY AP VP PERCENT: 0 %
MDC IDC STAT BRADY AP VS PERCENT: 98 %
MDC IDC STAT BRADY AS VS PERCENT: 2 %

## 2013-11-17 MED ORDER — FLECAINIDE ACETATE 100 MG PO TABS: 100.0000 mg | ORAL_TABLET | Freq: Two times a day (BID) | ORAL | Status: DC

## 2013-11-17 NOTE — Assessment & Plan Note (Signed)
Elevated. If we don't find Another cause for his hypertension, we will decrease his diltiazem and add a beta blocker

## 2013-11-17 NOTE — Progress Notes (Signed)
      Patient Care Team: [name], MD as PCP - General   HPI  [name] is a 80 y.o. male Seen in followup for a Myoview undertaken because of chest discomfort associated with paroxysms of atrial fibrillation. He has been on 1C antiarrhythmic therapy  With flecainide.  Myoview scanning demonstrated no ischemia and normal left ventricular function  He continures to have paroxyxms of atrial fibrillation that are quite disconcerting  CHADS-VASc score is 3.  He is currently taking apixaban. He is tolerating it well. Past Medical History  Diagnosis Date  . Hyperlipemia   . Hypertension   . Osteoarthritis   . BPH (benign prostatic hyperplasia)   . Allergic rhinitis   . Atrial fibrillation or flutter 12/10    Has been paroxysmal. Patient has not tolerated coumadin due to headaches. ECHO (12/10) Mild LVH, EF 55%,   . Sick sinus syndrome     S/P dual chamber Medtronic PCM  . Chest pain 12/10    Lexiscan myoview with EF 55%, no ischemia or infarction    Past Surgical History  Procedure Laterality Date  . Vasectomy  in his 74's  . Dual chamber pacemaker  01/11    Dr. Lovena Le  . Laparoscopic cholecystectomy  1/14    Dr Bary Castilla    Current Outpatient Prescriptions  Medication Sig Dispense Refill  . acetaminophen (TYLENOL) 325 MG tablet Take 650 mg by mouth every 6 (six) hours as needed.      . ALPRAZolam (XANAX) 0.25 MG tablet Take 1 tablet (0.25 mg total) by mouth 2 (two) times daily as needed for anxiety.  60 tablet  0  . apixaban (ELIQUIS) 5 MG TABS tablet Take 1 tablet (5 mg total) by mouth 2 (two) times daily.  60 tablet  6  . diltiazem (TIAZAC) 360 MG 24 hr capsule TAKE ONE CAPSULE BY MOUTH EVERY DAY  30 capsule  5  . flecainide (TAMBOCOR) 50 MG tablet Take 1 and 1/2 tablets twice daily  180 tablet  3  . Glucosamine 500 MG CAPS Take by mouth daily.        . ranitidine (ZANTAC) 150 MG tablet Take 150 mg by mouth daily. Before main meal      . tamsulosin (FLOMAX)  0.4 MG CAPS capsule TAKE ONE CAPSULE BY MOUTH EVERY DAY  30 capsule  10   No current facility-administered medications for this visit.    No Known Allergies  Review of Systems negative except from HPI and PMH  Physical Exam BP 165/77  Pulse 68  Ht 5\' 11"  (1.803 m)  Wt 179 lb 4 oz (81.307 kg)  BMI 25.01 kg/m2 Well developed and well nourished in no acute distress HENT normal E scleral and icterus clear Neck Supple JVP flat; carotids brisk and full Clear to ausculation  Regular rate and rhythm, no murmurs gallops or rub Soft with active bowel sounds No clubbing cyanosis none Edema Alert and oriented, grossly normal motor and sensory function Skin Warm and Dry  ECG demonstrates atrial paced rhythm at 68 Intervals 27/14/43 Assessment and  Plan

## 2013-11-17 NOTE — Assessment & Plan Note (Signed)
Paroxysms are quite symptomatic we will check other potential primary issues like thyroid CBC and electrolytes. We'll increase his flecainide to 75--100 mg twice daily.  We have also discussed the next days which would include alternative antiarrhythmic therapy and consideration for catheter ablation.  We'll continue him on apixaban

## 2013-11-17 NOTE — Patient Instructions (Addendum)
Your physician recommends that you return for lab work in:  Today  TSH  BMP  CBC [place]  Your physician recommends that you schedule a follow-up appointment in:  3 months   Your physician has recommended you make the following change in your medication:  Increase Flecainide 100 mg twice daily

## 2013-11-17 NOTE — Assessment & Plan Note (Signed)
The patient's device was interrogated.  The information was reviewed. No changes were made in the programming.    

## 2013-11-18 LAB — CBC WITH DIFFERENTIAL
BASOS ABS: 0.1 10*3/uL (ref 0.0–0.2)
Basos: 1 %
EOS ABS: 0 10*3/uL (ref 0.0–0.4)
Eos: 1 %
HEMATOCRIT: 42.6 % (ref 37.5–51.0)
Hemoglobin: 14.5 g/dL (ref 12.6–17.7)
IMMATURE GRANULOCYTES: 0 %
Immature Grans (Abs): 0 10*3/uL (ref 0.0–0.1)
LYMPHS ABS: 1.9 10*3/uL (ref 0.7–3.1)
Lymphs: 37 %
MCH: 30.7 pg (ref 26.6–33.0)
MCHC: 34 g/dL (ref 31.5–35.7)
MCV: 90 fL (ref 79–97)
Monocytes Absolute: 0.5 10*3/uL (ref 0.1–0.9)
Monocytes: 10 %
NEUTROS ABS: 2.7 10*3/uL (ref 1.4–7.0)
Neutrophils Relative %: 51 %
PLATELETS: 230 10*3/uL (ref 150–379)
RBC: 4.72 x10E6/uL (ref 4.14–5.80)
RDW: 14.3 % (ref 12.3–15.4)
WBC: 5.2 10*3/uL (ref 3.4–10.8)

## 2013-11-18 LAB — BASIC METABOLIC PANEL
BUN / CREAT RATIO: 13 (ref 10–22)
BUN: 14 mg/dL (ref 8–27)
CHLORIDE: 102 mmol/L (ref 97–108)
CO2: 24 mmol/L (ref 18–29)
Calcium: 9.1 mg/dL (ref 8.6–10.2)
Creatinine, Ser: 1.06 mg/dL (ref 0.76–1.27)
GFR calc Af Amer: 77 mL/min/{1.73_m2} (ref >59)
GFR calc non Af Amer: 67 mL/min/{1.73_m2} (ref >59)
Glucose: 88 mg/dL (ref 65–99)
POTASSIUM: 5 mmol/L (ref 3.5–5.2)
Sodium: 143 mmol/L (ref 134–144)

## 2013-11-18 LAB — MAGNESIUM: MAGNESIUM: 2.3 mg/dL (ref 1.6–2.6)

## 2013-11-18 LAB — TSH: TSH: 1.33 u[IU]/mL (ref 0.450–4.500)

## 2013-12-02 ENCOUNTER — Telehealth: Payer: Self-pay | Admitting: Internal Medicine

## 2013-12-02 NOTE — Telephone Encounter (Signed)
Patient called , he states his pacemaker rate is set to be 55 to 60 beats/ minute. And his heart rate  is 87 to 90 beats/minute his BP is 106/50 pt denies any other symptoms. Pt states that he had a pacemaker checked 2 weeks ago and was told that sometimes his heart rate  will override the pacer; his hear rater will go up and BP will go down. pt just wants to know if a heart rate of 87 to 90 beats/minute is okay. Pt is aware that follow the instructions given per pacer check .Pt is aware to call the office if  heart rate is higher and have any Symptoms

## 2013-12-02 NOTE — Telephone Encounter (Signed)
New message        Pt is experiencing rapid heartbeat and wants to know if this is normal?

## 2013-12-07 NOTE — Telephone Encounter (Signed)
Pt's wife states pt not at home. We discussed that his HR is normal and that 87-90 bpm is ok. Advised to call office if HR goes above 100.  Pt's wife verbalized understanding and will relay information to husband.

## 2014-01-25 ENCOUNTER — Other Ambulatory Visit: Payer: Self-pay | Admitting: Internal Medicine

## 2014-01-25 NOTE — Telephone Encounter (Signed)
Okay #60 x 0 

## 2014-01-25 NOTE — Telephone Encounter (Signed)
rx called into pharmacy

## 2014-01-25 NOTE — Telephone Encounter (Signed)
04/23/13 

## 2014-02-11 ENCOUNTER — Encounter: Payer: Medicare Other | Admitting: Internal Medicine

## 2014-02-16 ENCOUNTER — Encounter: Payer: Self-pay | Admitting: Internal Medicine

## 2014-02-16 ENCOUNTER — Ambulatory Visit (INDEPENDENT_AMBULATORY_CARE_PROVIDER_SITE_OTHER): Payer: Medicare Other | Admitting: Internal Medicine

## 2014-02-16 VITALS — BP 123/73 | HR 63 | Ht 71.0 in | Wt 173.5 lb

## 2014-02-16 DIAGNOSIS — I495 Sick sinus syndrome: Secondary | ICD-10-CM

## 2014-02-16 DIAGNOSIS — I4891 Unspecified atrial fibrillation: Secondary | ICD-10-CM

## 2014-02-16 LAB — MDC_IDC_ENUM_SESS_TYPE_INCLINIC
Battery Impedance: 229 Ohm
Brady Statistic AP VP Percent: 0 %
Brady Statistic AS VP Percent: 0 %
Brady Statistic AS VS Percent: 2 %
Date Time Interrogation Session: 20150512095839
Lead Channel Impedance Value: 451 Ohm
Lead Channel Impedance Value: 527 Ohm
Lead Channel Pacing Threshold Amplitude: 0.75 V
Lead Channel Sensing Intrinsic Amplitude: 2 mV
Lead Channel Sensing Intrinsic Amplitude: 4 mV
Lead Channel Setting Pacing Amplitude: 1.875
MDC IDC MSMT BATTERY REMAINING LONGEVITY: 125 mo
MDC IDC MSMT BATTERY VOLTAGE: 2.8 V
MDC IDC MSMT LEADCHNL RA PACING THRESHOLD PULSEWIDTH: 0.4 ms
MDC IDC MSMT LEADCHNL RV PACING THRESHOLD AMPLITUDE: 0.75 V
MDC IDC MSMT LEADCHNL RV PACING THRESHOLD PULSEWIDTH: 0.4 ms
MDC IDC SET LEADCHNL RV PACING AMPLITUDE: 2 V
MDC IDC SET LEADCHNL RV PACING PULSEWIDTH: 0.4 ms
MDC IDC SET LEADCHNL RV SENSING SENSITIVITY: 2 mV
MDC IDC STAT BRADY AP VS PERCENT: 98 %

## 2014-02-16 NOTE — Patient Instructions (Signed)
Your physician wants you to follow-up in: 6 months with device clinic. You will receive a reminder letter in the mail two months in advance. If you don't receive a letter, please call our office to schedule the follow-up appointment.  Your physician wants you to follow-up in: 6 months with Dr. Caryl Comes. You will receive a reminder letter in the mail two months in advance. If you don't receive a letter, please call our office to schedule the follow-up appointment.

## 2014-02-16 NOTE — Progress Notes (Signed)
Patient Care Team: [name], MD as PCP - General   HPI  [name] is a 80 y.o. male Seen in followup for a Myoview undertaken because of chest discomfort associated with paroxysms of atrial fibrillation. He has been on 1C antiarrhythmic therapy With flecainide.  Myoview scanning demonstrated no ischemia and normal left ventricular function  He continures to have paroxyxms of atrial fibrillation that are quite disconcerting and at his last visit we increased his flecainide from 50>>100 bid   He has felt much better CHADS-VASc score is 3.  He is currently taking apixaban. He is tolerating it well.   Date   QRS duration  Dose 1/11  122   0 2011` ` 124   50  1/15   137   75 bid 5.15  132   100 bid   Past Medical History      Past Medical History  Diagnosis Date  . Hyperlipemia   . Hypertension   . Osteoarthritis   . BPH (benign prostatic hyperplasia)   . Allergic rhinitis   . Atrial fibrillation or flutter 12/10    Has been paroxysmal. Patient has not tolerated coumadin due to headaches. ECHO (12/10) Mild LVH, EF 55%,   . Sick sinus syndrome     S/P dual chamber Medtronic PCM  . Chest pain 12/10    Lexiscan myoview with EF 55%, no ischemia or infarction  . Pacemaker  medtronic 02/06/2010    Qualifier: Diagnosis of  By: Lovena Le, MD, [facility], Binnie Kand     Past Surgical History  Procedure Laterality Date  . Vasectomy  in his 21's  . Dual chamber pacemaker  01/11    Dr. Lovena Le  . Laparoscopic cholecystectomy  1/14    Dr Bary Castilla    Current Outpatient Prescriptions  Medication Sig Dispense Refill  . acetaminophen (TYLENOL) 325 MG tablet Take 650 mg by mouth every 6 (six) hours as needed.      . ALPRAZolam (XANAX) 0.25 MG tablet TAKE 1 TABLET BY MOUTH TWICE A DAY AS NEEDED FOR ANXIETY  60 tablet  0  . apixaban (ELIQUIS) 5 MG TABS tablet Take 1 tablet (5 mg total) by mouth 2 (two) times daily.  60 tablet  6  . diltiazem (TIAZAC) 360 MG 24 hr capsule  TAKE ONE CAPSULE BY MOUTH EVERY DAY  30 capsule  5  . flecainide (TAMBOCOR) 100 MG tablet Take 1 tablet (100 mg total) by mouth 2 (two) times daily.  180 tablet  3  . Glucosamine 500 MG CAPS Take by mouth daily.        . ranitidine (ZANTAC) 150 MG tablet Take 150 mg by mouth as needed. Before main meal      . tamsulosin (FLOMAX) 0.4 MG CAPS capsule TAKE ONE CAPSULE BY MOUTH EVERY DAY  30 capsule  10   No current facility-administered medications for this visit.    No Known Allergies  Review of Systems negative except from HPI and PMH  Physical Exam BP 123/73  Pulse 63  Ht 5\' 11"  (1.803 m)  Wt 173 lb 8 oz (78.699 kg)  BMI 24.21 kg/m2 Well developed and nourished in no acute distress HENT normal Neck supple with JVP-flat Carotids brisk and full without bruits Clear Regular rate and rhythm, no murmurs or gallops Abd-soft with active BS without hepatomegaly No Clubbing cyanosis edema Skin-warm and dry A & Oriented  Grossly normal sensory and motor function  ECG  MSR 63  20/13/43   Assessment and  Plan  AFib  Doing better on higher flecainide,  No significant change in QRS  We discussed alternative NOACs and he will look into pricing   Sinus node dysfunction 98% pacing    Pacer Medtronic The patient's device was interrogated.  The information was reviewed. No changes were made in the programming.

## 2014-04-20 ENCOUNTER — Other Ambulatory Visit: Payer: Self-pay | Admitting: Internal Medicine

## 2014-04-23 ENCOUNTER — Ambulatory Visit (INDEPENDENT_AMBULATORY_CARE_PROVIDER_SITE_OTHER): Payer: Medicare Other | Admitting: Internal Medicine

## 2014-04-23 ENCOUNTER — Encounter: Payer: Self-pay | Admitting: Internal Medicine

## 2014-04-23 VITALS — BP 132/84 | HR 63 | Temp 97.9°F | Ht 70.5 in | Wt 171.0 lb

## 2014-04-23 DIAGNOSIS — Z7189 Other specified counseling: Secondary | ICD-10-CM

## 2014-04-23 DIAGNOSIS — Z23 Encounter for immunization: Secondary | ICD-10-CM

## 2014-04-23 DIAGNOSIS — E785 Hyperlipidemia, unspecified: Secondary | ICD-10-CM

## 2014-04-23 DIAGNOSIS — I4891 Unspecified atrial fibrillation: Secondary | ICD-10-CM

## 2014-04-23 DIAGNOSIS — L570 Actinic keratosis: Secondary | ICD-10-CM

## 2014-04-23 DIAGNOSIS — N138 Other obstructive and reflux uropathy: Secondary | ICD-10-CM

## 2014-04-23 DIAGNOSIS — Z0000 Encounter for general adult medical examination without abnormal findings: Secondary | ICD-10-CM

## 2014-04-23 DIAGNOSIS — I482 Chronic atrial fibrillation, unspecified: Secondary | ICD-10-CM

## 2014-04-23 DIAGNOSIS — N401 Enlarged prostate with lower urinary tract symptoms: Secondary | ICD-10-CM

## 2014-04-23 DIAGNOSIS — K219 Gastro-esophageal reflux disease without esophagitis: Secondary | ICD-10-CM

## 2014-04-23 DIAGNOSIS — I10 Essential (primary) hypertension: Secondary | ICD-10-CM

## 2014-04-23 NOTE — Assessment & Plan Note (Signed)
Single lesion on left forearm treated for 45 seconds, then 35 seconds Tolerated well Discussed home care

## 2014-04-23 NOTE — Assessment & Plan Note (Signed)
Discussed primary prevention No statin

## 2014-04-23 NOTE — Progress Notes (Signed)
Pre visit review using our clinic review tool, if applicable. No additional management support is needed unless otherwise documented below in the visit note. 

## 2014-04-23 NOTE — Addendum Note (Signed)
Addended by: Lurlean Nanny on: 04/23/2014 10:07 AM   Modules accepted: Orders

## 2014-04-23 NOTE — Progress Notes (Signed)
Subjective:    Patient ID: [name], male    DOB: [birth year], 80 y.o.   MRN: [id number]  HPI Here for Medicare wellness, physical and follow up Reviewed form and advanced directives Due back to eye doctor--doing okay Poor hearing --- not happy with his hearing aides No tobacco or alcohol Only other doctor is Dr Caryl Comes for heart Tries to exercise regularly No apparent cognitive issues  Keeps up with cardiologist Now on apixaban-- notes easy bleeding but otherwise okay with it Does get some palpitations No chest pain or SOB No dizziness or syncope Exercise tolerance is fairly stable  Shoulder pain is better Increased glucosamine and it seemed to help  Voids okay No significant daytime urgency or frequency Same nocturia--not bothersome  Heartburn seems better Still flares with greasy food Only needs the ranitidine once a week or so No dysphagia  Current Outpatient Prescriptions on File Prior to Visit  Medication Sig Dispense Refill  . acetaminophen (TYLENOL) 325 MG tablet Take 650 mg by mouth every 6 (six) hours as needed.      . ALPRAZolam (XANAX) 0.25 MG tablet TAKE 1 TABLET BY MOUTH TWICE A DAY AS NEEDED FOR ANXIETY  60 tablet  0  . apixaban (ELIQUIS) 5 MG TABS tablet Take 1 tablet (5 mg total) by mouth 2 (two) times daily.  60 tablet  6  . diltiazem (TIAZAC) 360 MG 24 hr capsule TAKE ONE CAPSULE BY MOUTH EVERY DAY  30 capsule  3  . flecainide (TAMBOCOR) 100 MG tablet Take 1 tablet (100 mg total) by mouth 2 (two) times daily.  180 tablet  3  . Glucosamine 500 MG CAPS Take by mouth daily.        . ranitidine (ZANTAC) 150 MG tablet Take 150 mg by mouth as needed. Before main meal      . tamsulosin (FLOMAX) 0.4 MG CAPS capsule TAKE ONE CAPSULE BY MOUTH EVERY DAY  30 capsule  10   No current facility-administered medications on file prior to visit.    No Known Allergies  Past Medical History  Diagnosis Date  . Hyperlipemia   . Hypertension   . Osteoarthritis   .  BPH (benign prostatic hyperplasia)   . Allergic rhinitis   . Atrial fibrillation or flutter 12/10    Has been paroxysmal. Patient has not tolerated coumadin due to headaches. ECHO (12/10) Mild LVH, EF 55%,   . Sick sinus syndrome     S/P dual chamber Medtronic PCM  . Chest pain 12/10    Lexiscan myoview with EF 55%, no ischemia or infarction  . Pacemaker  medtronic 02/06/2010    Qualifier: Diagnosis of  By: Lovena Le, MD, [facility], Binnie Kand     Past Surgical History  Procedure Laterality Date  . Vasectomy  in his 70's  . Dual chamber pacemaker  01/11    Dr. Lovena Le  . Laparoscopic cholecystectomy  1/14    Dr Bary Castilla    Family History  Problem Relation Age of Onset  . Hypertension Mother   . Alcohol abuse Father     History   Social History  . Marital Status: Married    Spouse Name: N/A    Number of Children: 3  . Years of Education: N/A   Occupational History  . Retired     Hydrologist   Social History Main Topics  . Smoking status: Former Research scientist (life sciences)  . Smokeless tobacco: Never Used     Comment: In [employer], quit in 1960's  .  Alcohol Use: No  . Drug Use: No  . Sexual Activity: Not on file   Other Topics Concern  . Not on file   Social History Narrative   2 Daughters, one son killed in [place]      Has living will   Wife, then daughter Maudie Mercury, is health care POA   Would accept resuscitation attempts but no prolonged life support.   No tube feeding if cognitively unaware   Review of Systems  Constitutional: Negative for fatigue and unexpected weight change.       Wears seat belt  HENT: Positive for hearing loss. Negative for dental problem and tinnitus.        Regular with dentist  Eyes: Positive for visual disturbance.       No diplopia or unilateral vision loss Only slight haze  Respiratory: Negative for cough, chest tightness and shortness of breath.   Cardiovascular: Positive for palpitations. Negative for chest pain and leg swelling.  Gastrointestinal:  Positive for constipation. Negative for nausea, vomiting, abdominal pain and blood in stool.       Occ needs laxative--nothing new  Endocrine: Negative for cold intolerance and heat intolerance.  Genitourinary: Negative for urgency, frequency and difficulty urinating.       Stable nocturia  Musculoskeletal: Negative for arthralgias, back pain and joint swelling.  Skin: Negative for rash.       Has a couple of repetitively scabbing lesions on left forearm  Allergic/Immunologic: Positive for environmental allergies. Negative for immunocompromised state.  Neurological: Negative for dizziness, syncope, weakness, light-headedness, numbness and headaches.  Psychiatric/Behavioral: Negative for sleep disturbance and dysphoric mood. The patient is not nervous/anxious.        Objective:   Physical Exam  Constitutional: He is oriented to person, place, and time. He appears well-developed and well-nourished. No distress.  HENT:  Head: Normocephalic and atraumatic.  Right Ear: External ear normal.  Left Ear: External ear normal.  Mouth/Throat: Oropharynx is clear and moist. No oropharyngeal exudate.  Eyes: Conjunctivae and EOM are normal. Pupils are equal, round, and reactive to light.  Neck: Normal range of motion. Neck supple. No thyromegaly present.  Cardiovascular: Normal rate, regular rhythm, normal heart sounds and intact distal pulses.  Exam reveals no gallop.   No murmur heard. Pulmonary/Chest: Effort normal and breath sounds normal. No respiratory distress. He has no wheezes. He has no rales.  Abdominal: Soft. There is no tenderness.  Musculoskeletal: He exhibits no edema and no tenderness.  Lymphadenopathy:    He has no cervical adenopathy.  Neurological: He is alert and oriented to person, place, and time.  President-- "Obama, Bush, Clinton" [phone] [id number] Recall 3/3  Skin: No rash noted.  71mm actinic on left forearm Small other scaly area-- not clearly inflamed    Psychiatric: He has a normal mood and affect. His behavior is normal.          Assessment & Plan:

## 2014-04-23 NOTE — Assessment & Plan Note (Signed)
Paced Rate is fine On apixaban

## 2014-04-23 NOTE — Assessment & Plan Note (Signed)
Rarely needs meds

## 2014-04-23 NOTE — Assessment & Plan Note (Signed)
I have personally reviewed the Medicare Annual Wellness questionnaire and have noted 1. The patient's medical and social history 2. Their use of alcohol, tobacco or illicit drugs 3. Their current medications and supplements 4. The patient's functional ability including ADL's, fall risks, home safety risks and hearing or visual             impairment. 5. Diet and physical activities 6. Evidence for depression or mood disorders  The patients weight, height, BMI and visual acuity have been recorded in the chart I have made referrals, counseling and provided education to the patient based review of the above and I have provided the pt with a written personalized care plan for preventive services.  I have provided you with a copy of your personalized plan for preventive services. Please take the time to review along with your updated medication list.  Hearing is poor--otherwise fine Will give prevnar now No cancer screening due to age Yearly flu shot

## 2014-04-23 NOTE — Assessment & Plan Note (Signed)
See social history 

## 2014-04-23 NOTE — Assessment & Plan Note (Signed)
Voids okay No need for meds--just mild nocturia

## 2014-04-23 NOTE — Assessment & Plan Note (Signed)
BP Readings from Last 3 Encounters:  04/23/14 132/84  02/16/14 123/73  11/17/13 165/77   Good control No changes

## 2014-04-24 ENCOUNTER — Telehealth: Payer: Self-pay | Admitting: Internal Medicine

## 2014-04-24 NOTE — Telephone Encounter (Signed)
Relevant patient education mailed to patient.  

## 2014-06-22 ENCOUNTER — Other Ambulatory Visit: Payer: Self-pay | Admitting: Internal Medicine

## 2014-06-22 NOTE — Telephone Encounter (Signed)
Requested Prescriptions   Signed Prescriptions Disp Refills  . ELIQUIS 5 MG TABS tablet 60 tablet 3    Sig: TAKE ONE TABLET BY MOUTH TWICE DAILY    Authorizing [place] Saber: Deboraha Sprang    Ordering User: Britt Bottom

## 2014-08-14 ENCOUNTER — Other Ambulatory Visit: Payer: Self-pay | Admitting: Internal Medicine

## 2014-08-15 ENCOUNTER — Other Ambulatory Visit: Payer: Self-pay | Admitting: Internal Medicine

## 2014-08-19 ENCOUNTER — Other Ambulatory Visit: Payer: Self-pay

## 2014-08-19 MED ORDER — DILTIAZEM HCL ER BEADS 360 MG PO CP24: ORAL_CAPSULE | ORAL | Status: DC

## 2014-08-19 NOTE — Telephone Encounter (Signed)
Refill sent for diltiazem

## 2014-08-24 ENCOUNTER — Encounter: Payer: Medicare Other | Admitting: Internal Medicine

## 2014-08-31 ENCOUNTER — Ambulatory Visit (INDEPENDENT_AMBULATORY_CARE_PROVIDER_SITE_OTHER): Payer: Medicare Other | Admitting: Internal Medicine

## 2014-08-31 ENCOUNTER — Encounter: Payer: Self-pay | Admitting: Internal Medicine

## 2014-08-31 VITALS — BP 150/87 | HR 65 | Ht 71.0 in | Wt 170.0 lb

## 2014-08-31 DIAGNOSIS — I4891 Unspecified atrial fibrillation: Secondary | ICD-10-CM

## 2014-08-31 DIAGNOSIS — I495 Sick sinus syndrome: Secondary | ICD-10-CM

## 2014-08-31 LAB — MDC_IDC_ENUM_SESS_TYPE_INCLINIC
Battery Impedance: 253 Ohm
Battery Voltage: 2.8 V
Brady Statistic AP VS Percent: 99 %
Brady Statistic AS VP Percent: 0 %
Brady Statistic AS VS Percent: 1 %
Date Time Interrogation Session: 20151124110809
Lead Channel Impedance Value: 537 Ohm
Lead Channel Impedance Value: 554 Ohm
Lead Channel Pacing Threshold Amplitude: 0.75 V
Lead Channel Pacing Threshold Amplitude: 1 V
Lead Channel Pacing Threshold Pulse Width: 0.4 ms
Lead Channel Pacing Threshold Pulse Width: 0.4 ms
Lead Channel Setting Pacing Amplitude: 2.25 V
Lead Channel Setting Sensing Sensitivity: 2 mV
MDC IDC MSMT BATTERY REMAINING LONGEVITY: 122 mo
MDC IDC MSMT LEADCHNL RA SENSING INTR AMPL: 2 mV
MDC IDC MSMT LEADCHNL RV SENSING INTR AMPL: 4 mV
MDC IDC SET LEADCHNL RV PACING AMPLITUDE: 2 V
MDC IDC SET LEADCHNL RV PACING PULSEWIDTH: 0.4 ms
MDC IDC STAT BRADY AP VP PERCENT: 0 %

## 2014-08-31 NOTE — Progress Notes (Signed)
Patient Care Team: [name], MD as PCP - General   HPI  [name] is a 80 y.o. male Seen in followup for a Myoview undertaken because of chest discomfort associated with paroxysms of atrial fibrillation. He has been on 1C antiarrhythmic therapy With flecainide.  Myoview scanning demonstrated no ischemia and normal left ventricular function  He continures to have paroxyxms of atrial fibrillation that are quite disconcerting and at his last visit we increased his flecainide from 50>>100 bid   He has felt much better CHADS-VASc score is 3.  He is currently taking apixaban. He is tolerating it well.   Flecainide   date QRS duration Dose  1/11 122 0  2/11 124 50  1/15 137 75  5/15 132 100  11/15 136 100     Past Medical History      Past Medical History  Diagnosis Date  . Hyperlipemia   . Hypertension   . Osteoarthritis   . BPH (benign prostatic hyperplasia)   . Allergic rhinitis   . Atrial fibrillation or flutter 12/10    Has been paroxysmal. Patient has not tolerated coumadin due to headaches. ECHO (12/10) Mild LVH, EF 55%,   . Sick sinus syndrome     S/P dual chamber Medtronic PCM  . Chest pain 12/10    Lexiscan myoview with EF 55%, no ischemia or infarction  . Pacemaker  medtronic 02/06/2010    Qualifier: Diagnosis of  By: Lovena Le, MD, [facility], Binnie Kand     Past Surgical History  Procedure Laterality Date  . Vasectomy  in his 20's  . Dual chamber pacemaker  01/11    Dr. Lovena Le  . Laparoscopic cholecystectomy  1/14    Dr Bary Castilla    Current Outpatient Prescriptions  Medication Sig Dispense Refill  . acetaminophen (TYLENOL) 325 MG tablet Take 650 mg by mouth every 6 (six) hours as needed.    . ALPRAZolam (XANAX) 0.25 MG tablet TAKE 1 TABLET BY MOUTH TWICE A DAY AS NEEDED FOR ANXIETY 60 tablet 0  . diltiazem (TIAZAC) 360 MG 24 hr capsule TAKE ONE CAPSULE BY MOUTH EVERY DAY 30 capsule 3  . ELIQUIS 5 MG TABS tablet TAKE ONE TABLET BY MOUTH  TWICE DAILY 60 tablet 3  . flecainide (TAMBOCOR) 100 MG tablet Take 1 tablet (100 mg total) by mouth 2 (two) times daily. 180 tablet 3  . Glucosamine 500 MG CAPS Take by mouth daily.      . ranitidine (ZANTAC) 150 MG tablet Take 150 mg by mouth as needed. Before main meal    . tamsulosin (FLOMAX) 0.4 MG CAPS capsule TAKE ONE CAPSULE BY MOUTH EVERY DAY 30 capsule 11   No current facility-administered medications for this visit.    No Known Allergies  Review of Systems negative except from HPI and PMH  Physical Exam BP 150/87 mmHg  Pulse 65  Ht 5\' 11"  (1.803 m)  Wt 77.111 kg (170 lb)  BMI 23.72 kg/m2 Well developed and nourished in no acute distress HENT normal Neck supple with JVP-flat Carotids brisk and full without bruits Clear The patient's device was interrogated.  The information was reviewed. No changes were made in the programming.    Regular rate and rhythm, no murmurs or gallops Abd-soft with active BS without hepatomegaly No Clubbing cyanosis edema Skin-warm and dry A & Oriented  Grossly normal sensory and motor function  ECG  MSR 63 20/13/43   Assessment and  Plan  AFib  Doing better on higher flecainide,  No significant change in QRS    Sinus node dysfunction 98% pacing     Hypertension typically blood pressure runs better at home  Pacer Medtronic The patient's device was interrogated.  The information was reviewed. No changes were made in the programming.    We will check renal function and CBC on apixaban

## 2014-08-31 NOTE — Patient Instructions (Signed)
Your physician recommends that you have labs today: CBC  BMP  Your physician wants you to follow-up in: 1 year with Dr. Caryl Comes. You will receive a reminder letter in the mail two months in advance. If you don't receive a letter, please call our office to schedule the follow-up appointment.  Your physician recommends that you continue on your current medications as directed. Please refer to the Current Medication list given to you today.

## 2014-09-01 LAB — CBC WITH DIFFERENTIAL
BASOS ABS: 0 10*3/uL (ref 0.0–0.2)
Basos: 0 %
Eos: 1 %
Eosinophils Absolute: 0 10*3/uL (ref 0.0–0.4)
HCT: 41.6 % (ref 37.5–51.0)
Hemoglobin: 14.6 g/dL (ref 12.6–17.7)
IMMATURE GRANULOCYTES: 0 %
Immature Grans (Abs): 0 10*3/uL (ref 0.0–0.1)
LYMPHS: 33 %
Lymphocytes Absolute: 1.9 10*3/uL (ref 0.7–3.1)
MCH: 30.9 pg (ref 26.6–33.0)
MCHC: 35.1 g/dL (ref 31.5–35.7)
MCV: 88 fL (ref 79–97)
MONOS ABS: 0.6 10*3/uL (ref 0.1–0.9)
Monocytes: 10 %
Neutrophils Absolute: 3.1 10*3/uL (ref 1.4–7.0)
Neutrophils Relative %: 56 %
PLATELETS: 291 10*3/uL (ref 150–379)
RBC: 4.73 x10E6/uL (ref 4.14–5.80)
RDW: 13.3 % (ref 12.3–15.4)
WBC: 5.6 10*3/uL (ref 3.4–10.8)

## 2014-09-01 LAB — BASIC METABOLIC PANEL
BUN/Creatinine Ratio: 17 (ref 10–22)
BUN: 17 mg/dL (ref 8–27)
CHLORIDE: 100 mmol/L (ref 97–108)
CO2: 20 mmol/L (ref 18–29)
Calcium: 9 mg/dL (ref 8.6–10.2)
Creatinine, Ser: 1.01 mg/dL (ref 0.76–1.27)
GFR calc Af Amer: 81 mL/min/{1.73_m2} (ref >59)
GFR calc non Af Amer: 70 mL/min/{1.73_m2} (ref >59)
Glucose: 81 mg/dL (ref 65–99)
POTASSIUM: 4.7 mmol/L (ref 3.5–5.2)
SODIUM: 138 mmol/L (ref 134–144)

## 2014-09-07 DIAGNOSIS — K5792 Diverticulitis of intestine, part unspecified, without perforation or abscess without bleeding: Secondary | ICD-10-CM

## 2014-09-07 HISTORY — DX: Diverticulitis of intestine, part unspecified, without perforation or abscess without bleeding: K57.92

## 2014-09-17 ENCOUNTER — Encounter: Payer: Self-pay | Admitting: Internal Medicine

## 2014-09-17 ENCOUNTER — Ambulatory Visit (INDEPENDENT_AMBULATORY_CARE_PROVIDER_SITE_OTHER): Payer: Medicare Other | Admitting: Internal Medicine

## 2014-09-17 VITALS — BP 138/80 | HR 83 | Temp 98.4°F | Wt 171.0 lb

## 2014-09-17 DIAGNOSIS — I480 Paroxysmal atrial fibrillation: Secondary | ICD-10-CM

## 2014-09-17 DIAGNOSIS — H6120 Impacted cerumen, unspecified ear: Secondary | ICD-10-CM | POA: Insufficient documentation

## 2014-09-17 DIAGNOSIS — Z23 Encounter for immunization: Secondary | ICD-10-CM

## 2014-09-17 DIAGNOSIS — H6121 Impacted cerumen, right ear: Secondary | ICD-10-CM

## 2014-09-17 NOTE — Assessment & Plan Note (Signed)
Cleaned successfully with flushing

## 2014-09-17 NOTE — Progress Notes (Signed)
Pre visit review using our clinic review tool, if applicable. No additional management support is needed unless otherwise documented below in the visit note. 

## 2014-09-17 NOTE — Progress Notes (Signed)
Subjective:    Patient ID: [name], male    DOB: 1933/03/05, 80 y.o.   MRN: [id number]  HPI  Here due to wax build up in right ear Went to get his hearing aides adjusted and was told his canal was full No pain No fever No URI symptoms  Yesterday went to play golf Skipped all AM meds by oversight When there--- he noted tightness in chest Not really painful but pushing sensation Slight SOB He stopped --wouldn't let friends call 911 Went home and felt better Did note his heart was fast  Current Outpatient Prescriptions on File Prior to Visit  Medication Sig Dispense Refill  . acetaminophen (TYLENOL) 325 MG tablet Take 650 mg by mouth every 6 (six) hours as needed.    . ALPRAZolam (XANAX) 0.25 MG tablet TAKE 1 TABLET BY MOUTH TWICE A DAY AS NEEDED FOR ANXIETY 60 tablet 0  . diltiazem (TIAZAC) 360 MG 24 hr capsule TAKE ONE CAPSULE BY MOUTH EVERY DAY 30 capsule 3  . ELIQUIS 5 MG TABS tablet TAKE ONE TABLET BY MOUTH TWICE DAILY 60 tablet 3  . flecainide (TAMBOCOR) 100 MG tablet Take 1 tablet (100 mg total) by mouth 2 (two) times daily. 180 tablet 3  . Glucosamine 500 MG CAPS Take by mouth daily.      . ranitidine (ZANTAC) 150 MG tablet Take 150 mg by mouth as needed. Before main meal    . tamsulosin (FLOMAX) 0.4 MG CAPS capsule TAKE ONE CAPSULE BY MOUTH EVERY DAY 30 capsule 11   No current facility-administered medications on file prior to visit.    No Known Allergies  Past Medical History  Diagnosis Date  . Hyperlipemia   . Hypertension   . Osteoarthritis   . BPH (benign prostatic hyperplasia)   . Allergic rhinitis   . Atrial fibrillation or flutter 12/10    Has been paroxysmal. Patient has not tolerated coumadin due to headaches. ECHO (12/10) Mild LVH, EF 55%,   . Sick sinus syndrome     S/P dual chamber Medtronic PCM  . Chest pain 12/10    Lexiscan myoview with EF 55%, no ischemia or infarction  . Pacemaker  medtronic 02/06/2010    Qualifier: Diagnosis of  By:  Lovena Le, MD, [facility], Binnie Kand     Past Surgical History  Procedure Laterality Date  . Vasectomy  in his 88's  . Dual chamber pacemaker  01/11    Dr. Lovena Le  . Laparoscopic cholecystectomy  1/14    Dr Bary Castilla    Family History  Problem Relation Age of Onset  . Hypertension Mother   . Alcohol abuse Father     History   Social History  . Marital Status: Married    Spouse Name: N/A    Number of Children: 3  . Years of Education: N/A   Occupational History  . Retired     Hydrologist   Social History Main Topics  . Smoking status: Former Research scientist (life sciences)  . Smokeless tobacco: Never Used     Comment: In [employer], quit in 1960's  . Alcohol Use: No  . Drug Use: No  . Sexual Activity: Not on file   Other Topics Concern  . Not on file   Social History Narrative   2 Daughters, one son killed in [place]      Has living will   Wife, then daughter Maudie Mercury, is health care POA   Would accept resuscitation attempts but no prolonged life support.  No tube feeding if cognitively unaware   Review of Systems  Sleeping well Appetite is good     Objective:   Physical Exam  Constitutional: He appears well-developed and well-nourished. No distress.  HENT:  Right canal with cerumen---was cleaned out completely with flushing  Neck: Normal range of motion. Neck supple. No thyromegaly present.  Cardiovascular: Normal rate, regular rhythm and normal heart sounds.  Exam reveals no gallop.   No murmur heard. Pulmonary/Chest: Effort normal and breath sounds normal. No respiratory distress. He has no wheezes. He has no rales.  Musculoskeletal: He exhibits no edema or tenderness.  Lymphadenopathy:    He has no cervical adenopathy.  Psychiatric: He has a normal mood and affect. His behavior is normal.          Assessment & Plan:

## 2014-09-17 NOTE — Assessment & Plan Note (Signed)
Had extended spell yesterday with chest pressure and slight dyspnea Did finally go away on its own Had missed his meds  Will check with Dr Caryl Comes to see if he needs prn med for paroxysms

## 2014-09-17 NOTE — Addendum Note (Signed)
Addended by: Despina Hidden on: 09/17/2014 02:28 PM   Modules accepted: Orders

## 2014-10-05 ENCOUNTER — Telehealth: Payer: Self-pay

## 2014-10-05 ENCOUNTER — Observation Stay: Payer: Self-pay | Admitting: Internal Medicine

## 2014-10-05 LAB — URINALYSIS, COMPLETE
Bacteria: NONE SEEN
Bilirubin,UR: NEGATIVE
Blood: NEGATIVE
GLUCOSE, UR: NEGATIVE mg/dL (ref 0–75)
KETONE: NEGATIVE
LEUKOCYTE ESTERASE: NEGATIVE
Nitrite: NEGATIVE
PROTEIN: NEGATIVE
Ph: 5 (ref 4.5–8.0)
SPECIFIC GRAVITY: 1.024 (ref 1.003–1.030)
Squamous Epithelial: NONE SEEN
WBC UR: 1 /HPF (ref 0–5)

## 2014-10-05 LAB — COMPREHENSIVE METABOLIC PANEL
ALBUMIN: 3.5 g/dL (ref 3.4–5.0)
ANION GAP: 9 (ref 7–16)
Alkaline Phosphatase: 85 U/L
BUN: 13 mg/dL (ref 7–18)
Bilirubin,Total: 0.8 mg/dL (ref 0.2–1.0)
Calcium, Total: 8.8 mg/dL (ref 8.5–10.1)
Chloride: 106 mmol/L (ref 98–107)
Co2: 24 mmol/L (ref 21–32)
Creatinine: 1.03 mg/dL (ref 0.60–1.30)
EGFR (African American): >60
EGFR (Non-African Amer.): >60
Glucose: 102 mg/dL — ABNORMAL HIGH (ref 65–99)
Osmolality: 278 (ref 275–301)
POTASSIUM: 3.7 mmol/L (ref 3.5–5.1)
SGOT(AST): 20 U/L (ref 15–37)
SGPT (ALT): 18 U/L
Sodium: 139 mmol/L (ref 136–145)
TOTAL PROTEIN: 8.1 g/dL (ref 6.4–8.2)

## 2014-10-05 LAB — CBC WITH DIFFERENTIAL/PLATELET
Basophil #: 0.1 10*3/uL (ref 0.0–0.1)
Basophil %: 0.8 %
EOS ABS: 0 10*3/uL (ref 0.0–0.7)
Eosinophil %: 0.1 %
HCT: 43.9 % (ref 40.0–52.0)
HGB: 14.8 g/dL (ref 13.0–18.0)
LYMPHS PCT: 17.9 %
Lymphocyte #: 2 10*3/uL (ref 1.0–3.6)
MCH: 31.2 pg (ref 26.0–34.0)
MCHC: 33.6 g/dL (ref 32.0–36.0)
MCV: 93 fL (ref 80–100)
MONO ABS: 1.1 x10 3/mm — AB (ref 0.2–1.0)
MONOS PCT: 9.5 %
Neutrophil #: 8 10*3/uL — ABNORMAL HIGH (ref 1.4–6.5)
Neutrophil %: 71.7 %
Platelet: 239 10*3/uL (ref 150–440)
RBC: 4.73 10*6/uL (ref 4.40–5.90)
RDW: 13.4 % (ref 11.5–14.5)
WBC: 11.1 10*3/uL — ABNORMAL HIGH (ref 3.8–10.6)

## 2014-10-05 LAB — LIPASE, BLOOD: Lipase: 128 U/L (ref 73–393)

## 2014-10-05 NOTE — Telephone Encounter (Signed)
PLEASE NOTE: All timestamps contained within this report are represented as [place] Standard Time. CONFIDENTIALTY NOTICE: This fax transmission is intended only for the addressee. It contains information that is legally privileged, confidential or otherwise protected from use or disclosure. If you are not the intended recipient, you are strictly prohibited from reviewing, disclosing, copying using or disseminating any of this information or taking any action in reliance on or regarding this information. If you have received this fax in error, please notify us immediately by telephone so that we can arrange for its return to [place]. Phone: [phone], Toll-Free: [phone], Fax: [phone] Page: 1 of 2 Call Id: [id number] [place] Patient Name: [name] Demarest Gender: Male DOB: 08/06/1933 Age: 80 Y 32 M 21 D Return Phone Number: [phone] (Primary) Address: [address] City/State/Zip: [place] [address] Client [facility] [place] [facility] - Client Client Site [facility] - Day Physician Viviana Simpler Contact Type Call Call Type Triage / Clinical Relationship To Patient Self Return Phone Number [phone] (Primary) Chief Complaint ABDOMINAL PAIN - Severe and only in abdomen Initial Comment caller states he has severe abdominal pains PreDisposition Call Doctor Nurse Assessment Nurse: Julien Girt, RN, Almyra Free Date/Time Eilene Ghazi Time): 10/05/2014 9:24:15 AM Confirm and document reason for call. If symptomatic, describe symptoms. ---Caller states he has severe lower abdominal pain. States this pain began last night, woke him up, and is getting worse this morning. Has the patient traveled out of the country within the last 30 days? ---Not Applicable Does the patient require triage? ---Yes Related visit to physician within the last 2 weeks? ---No Does the PT have  any chronic conditions? (i.e. diabetes, asthma, etc.) ---Yes List chronic conditions. ---pacemaker, htn Guidelines Guideline Title Affirmed Question Affirmed Notes Nurse Date/Time (Eastern Time) Abdominal Pain - Male [1] SEVERE pain AND [2] age > 70 Chancy Hurter 10/05/2014 9:26:07 AM Disp. Time Eilene Ghazi Time) Disposition Final User 10/05/2014 9:22:21 AM Send to Urgent Queue [place] [facility] 10/05/2014 9:29:22 AM Go to ED Now Yes Julien Girt, RN, Dagoberto Reef Understands: Yes Disagree/Comply: Comply Care Advice Given Per Guideline PLEASE NOTE: All timestamps contained within this report are represented as [place] Standard Time. CONFIDENTIALTY NOTICE: This fax transmission is intended only for the addressee. It contains information that is legally privileged, confidential or otherwise protected from use or disclosure. If you are not the intended recipient, you are strictly prohibited from reviewing, disclosing, copying using or disseminating any of this information or taking any action in reliance on or regarding this information. If you have received this fax in error, please notify us immediately by telephone so that we can arrange for its return to [place]. Phone: [phone], Toll-Free: [phone], Fax: [phone] Page: 2 of 2 Call Id: [id number] Care Advice Given Per Guideline GO TO ED NOW: You need to be seen in the Emergency Department. Go to the ER at ___________ [place] now. Drive carefully. DRIVING: Another adult should drive. NOTHING BY MOUTH: Do not eat or drink anything for now. (Reason: condition may need surgery and general anesthesia) * Please bring a list of your current medicines when you go to the Emergency Department (ER). CARE ADVICE given per Abdominal Pain, Male (Adult) guideline. After Care Instructions Given Call Event Type User Date / Time Description Referrals [facility] - ED

## 2014-10-05 NOTE — Telephone Encounter (Signed)
Please check on him tomorrow 

## 2014-10-06 LAB — BASIC METABOLIC PANEL
Anion Gap: 6 — ABNORMAL LOW (ref 7–16)
BUN: 11 mg/dL (ref 7–18)
CO2: 28 mmol/L (ref 21–32)
Calcium, Total: 8.2 mg/dL — ABNORMAL LOW (ref 8.5–10.1)
Chloride: 103 mmol/L (ref 98–107)
Creatinine: 1.2 mg/dL (ref 0.60–1.30)
EGFR (Non-African Amer.): >60
GLUCOSE: 125 mg/dL — AB (ref 65–99)
Osmolality: 275 (ref 275–301)
Potassium: 3.6 mmol/L (ref 3.5–5.1)
Sodium: 137 mmol/L (ref 136–145)

## 2014-10-06 LAB — CK TOTAL AND CKMB (NOT AT ARMC)
CK, Total: 73 U/L (ref 39–308)
CK-MB: 0.5 ng/mL (ref 0.5–3.6)

## 2014-10-06 LAB — WBC: WBC: 8.1 10*3/uL (ref 3.8–10.6)

## 2014-10-06 LAB — TROPONIN I: Troponin-I: <0.02 ng/mL

## 2014-10-06 NOTE — Telephone Encounter (Signed)
.  left message to have patient return my call.  

## 2014-10-07 ENCOUNTER — Other Ambulatory Visit: Payer: Self-pay | Admitting: Internal Medicine

## 2014-10-11 ENCOUNTER — Encounter: Payer: Self-pay | Admitting: Internal Medicine

## 2014-10-11 NOTE — Telephone Encounter (Signed)
rx called into pharmacy

## 2014-10-11 NOTE — Telephone Encounter (Signed)
Approved: #[id number] x [phone] 

## 2014-10-11 NOTE — Telephone Encounter (Signed)
Left message asking how pt is doing advised to return my call

## 2014-10-11 NOTE — Telephone Encounter (Signed)
01/25/14 

## 2014-10-18 ENCOUNTER — Encounter: Payer: Self-pay | Admitting: Internal Medicine

## 2014-10-18 ENCOUNTER — Other Ambulatory Visit: Payer: Self-pay | Admitting: *Deleted

## 2014-10-18 ENCOUNTER — Ambulatory Visit (INDEPENDENT_AMBULATORY_CARE_PROVIDER_SITE_OTHER): Payer: Medicare Other | Admitting: Internal Medicine

## 2014-10-18 VITALS — BP 140/70 | HR 80 | Temp 98.0°F | Wt 172.0 lb

## 2014-10-18 DIAGNOSIS — K5792 Diverticulitis of intestine, part unspecified, without perforation or abscess without bleeding: Secondary | ICD-10-CM

## 2014-10-18 NOTE — Progress Notes (Signed)
   Subjective:    Patient ID: [name], male    DOB: [birth year], 81 y.o.   MRN: [id number]  HPI    Review of Systems     Objective:   Physical Exam  Cardiovascular:  Gr 2/6 aortic systolic murmur          Assessment & Plan:

## 2014-10-18 NOTE — Progress Notes (Signed)
Subjective:    Patient ID: [name], male    DOB: 05-26-1933, 81 y.o.   MRN: [id number]  HPI Here for follow up of diverticulitis  Developed severe LLQ abdominal pain 12/29 CT positive for diverticulitis All [facility] records reviewed Started on IV antibiotics but improved quickly  Sent home the next day on cipro and flagyl These made him sick so he stopped it after 4 days Has been well since No fever Bowels okay with his usual laxative  No travel before this Did eat a lot of sweets, candy, nuts, etc over Christmas  Current Outpatient Prescriptions on File Prior to Visit  Medication Sig Dispense Refill  . acetaminophen (TYLENOL) 325 MG tablet Take 650 mg by mouth every 6 (six) hours as needed.    . ALPRAZolam (XANAX) 0.25 MG tablet TAKE ONE TABLET BY MOUTH TWICE DAILY AS NEEDED FOR ANXIETY 60 tablet 0  . diltiazem (TIAZAC) 360 MG 24 hr capsule TAKE ONE CAPSULE BY MOUTH EVERY DAY 30 capsule 3  . ELIQUIS 5 MG TABS tablet TAKE ONE TABLET BY MOUTH TWICE DAILY 60 tablet 3  . flecainide (TAMBOCOR) 100 MG tablet Take 1 tablet (100 mg total) by mouth 2 (two) times daily. 180 tablet 3  . Glucosamine 500 MG CAPS Take by mouth daily.      . ranitidine (ZANTAC) 150 MG tablet Take 150 mg by mouth as needed. Before main meal    . tamsulosin (FLOMAX) 0.4 MG CAPS capsule TAKE ONE CAPSULE BY MOUTH EVERY DAY 30 capsule 11   No current facility-administered medications on file prior to visit.    No Known Allergies  Past Medical History  Diagnosis Date  . Hyperlipemia   . Hypertension   . Osteoarthritis   . BPH (benign prostatic hyperplasia)   . Allergic rhinitis   . Atrial fibrillation or flutter 12/10    Has been paroxysmal. Patient has not tolerated coumadin due to headaches. ECHO (12/10) Mild LVH, EF 55%,   . Sick sinus syndrome     S/P dual chamber Medtronic PCM  . Chest pain 12/10    Lexiscan myoview with EF 55%, no ischemia or infarction  . Pacemaker  medtronic 02/06/2010   Qualifier: Diagnosis of  By: Lovena Le, MD, Martyn Malay   . Acute diverticulitis 12/15    [facility]    Past Surgical History  Procedure Laterality Date  . Vasectomy  in his 40's  . Dual chamber pacemaker  01/11    Dr. Lovena Le  . Laparoscopic cholecystectomy  1/14    Dr Bary Castilla    Family History  Problem Relation Age of Onset  . Hypertension Mother   . Alcohol abuse Father     History   Social History  . Marital Status: Married    Spouse Name: N/A    Number of Children: 3  . Years of Education: N/A   Occupational History  . Retired     Hydrologist   Social History Main Topics  . Smoking status: Former Research scientist (life sciences)  . Smokeless tobacco: Never Used     Comment: In [employer], quit in 1960's  . Alcohol Use: No  . Drug Use: No  . Sexual Activity: Not on file   Other Topics Concern  . Not on file   Social History Narrative   2 Daughters, one son killed in [place]      Has living will   Wife, then daughter Maudie Mercury, is health care POA   Would accept resuscitation attempts  but no prolonged life support.   No tube feeding if cognitively unaware   Review of Systems Had some pain into chest at first--no evidence of cardiac problem Tends to constipation-- uses MOM every 2-3 days. Not happy with miralax Appetite is great Weight stable    Objective:   Physical Exam  Constitutional: He appears well-developed and well-nourished. No distress.  Neck: Normal range of motion. Neck supple. No thyromegaly present.  Cardiovascular: Normal rate, regular rhythm and normal heart sounds.  Exam reveals no gallop.   No murmur heard. Pulmonary/Chest: Effort normal and breath sounds normal. No respiratory distress. He has no wheezes. He has no rales.  Abdominal: Soft. Bowel sounds are normal. He exhibits no distension. There is no tenderness. There is no rebound and no guarding.  Musculoskeletal: He exhibits no edema or tenderness.  Lymphadenopathy:    He has no cervical adenopathy.    Psychiatric: He has a normal mood and affect. His behavior is normal.          Assessment & Plan:

## 2014-10-18 NOTE — Assessment & Plan Note (Signed)
Better now ?related to his holiday eating Better quickly and only took 5 days of antibiotics Back to normal---no further action needed Records all reviewed

## 2014-10-18 NOTE — Progress Notes (Signed)
Pre visit review using our clinic review tool, if applicable. No additional management support is needed unless otherwise documented below in the visit note. 

## 2014-10-21 ENCOUNTER — Encounter: Payer: Self-pay | Admitting: Internal Medicine

## 2014-10-26 ENCOUNTER — Other Ambulatory Visit: Payer: Self-pay | Admitting: *Deleted

## 2014-10-26 MED ORDER — APIXABAN 5 MG PO TABS: 5.0000 mg | ORAL_TABLET | Freq: Two times a day (BID) | ORAL | Status: DC

## 2014-11-11 ENCOUNTER — Encounter: Payer: Self-pay | Admitting: Internal Medicine

## 2014-12-13 ENCOUNTER — Other Ambulatory Visit: Payer: Self-pay

## 2014-12-13 MED ORDER — FLECAINIDE ACETATE 100 MG PO TABS: 100.0000 mg | ORAL_TABLET | Freq: Two times a day (BID) | ORAL | Status: DC

## 2014-12-13 NOTE — Telephone Encounter (Signed)
Refill sent for tambocor 100 mg

## 2014-12-22 ENCOUNTER — Other Ambulatory Visit: Payer: Self-pay | Admitting: *Deleted

## 2014-12-22 MED ORDER — DILTIAZEM HCL ER BEADS 360 MG PO CP24: ORAL_CAPSULE | ORAL | Status: DC

## 2015-01-10 ENCOUNTER — Encounter: Payer: Self-pay | Admitting: Internal Medicine

## 2015-01-10 ENCOUNTER — Ambulatory Visit (INDEPENDENT_AMBULATORY_CARE_PROVIDER_SITE_OTHER): Payer: Medicare Other | Admitting: Internal Medicine

## 2015-01-10 VITALS — BP 140/70 | HR 75 | Temp 99.1°F | Wt 174.0 lb

## 2015-01-10 DIAGNOSIS — K5909 Other constipation: Secondary | ICD-10-CM

## 2015-01-10 DIAGNOSIS — K5900 Constipation, unspecified: Secondary | ICD-10-CM

## 2015-01-10 HISTORY — DX: Other constipation: K59.09

## 2015-01-10 NOTE — Patient Instructions (Signed)
Please take miralax (polyethylene glycol) 1 capful in full glass of water--- three times daily for the next 2-3 days. Then try milk of magnesia again or dulcolax (biscodyl) either as suppository or tablet. If this doesn't work, you can try a fleet's suppository. Once you clear out--I would recommend that you take the miralax once or twice every day just to keep things moving.

## 2015-01-10 NOTE — Progress Notes (Signed)
Subjective:    Patient ID: [name], male    DOB: January 31, 1933, 81 y.o.   MRN: [id number]  HPI Here due to some GI problems  Got into trouble with bad constipation about 2 weeks ago Tried laxative 2 nights and finally broke loose (MOM) Then cleared out but persisted with clear liquid drainage and some incontinence Now hasn't gone in 6-7 days  Mild cramping in stomach--not bad No N/V Appetite is fine Is passing gas  Current Outpatient Prescriptions on File Prior to Visit  Medication Sig Dispense Refill  . acetaminophen (TYLENOL) 325 MG tablet Take 650 mg by mouth every 6 (six) hours as needed.    . ALPRAZolam (XANAX) 0.25 MG tablet TAKE ONE TABLET BY MOUTH TWICE DAILY AS NEEDED FOR ANXIETY 60 tablet 0  . apixaban (ELIQUIS) 5 MG TABS tablet Take 1 tablet (5 mg total) by mouth 2 (two) times daily. 60 tablet 3  . diltiazem (TIAZAC) 360 MG 24 hr capsule TAKE ONE CAPSULE BY MOUTH EVERY DAY 30 capsule 3  . flecainide (TAMBOCOR) 100 MG tablet Take 1 tablet (100 mg total) by mouth 2 (two) times daily. 180 tablet 3  . Glucosamine 500 MG CAPS Take by mouth daily.      . ranitidine (ZANTAC) 150 MG tablet Take 150 mg by mouth as needed. Before main meal    . tamsulosin (FLOMAX) 0.4 MG CAPS capsule TAKE ONE CAPSULE BY MOUTH EVERY DAY 30 capsule 11   No current facility-administered medications on file prior to visit.    No Known Allergies  Past Medical History  Diagnosis Date  . Hyperlipemia   . Hypertension   . Osteoarthritis   . BPH (benign prostatic hyperplasia)   . Allergic rhinitis   . Atrial fibrillation or flutter 12/10    Has been paroxysmal. Patient has not tolerated coumadin due to headaches. ECHO (12/10) Mild LVH, EF 55%,   . Sick sinus syndrome     S/P dual chamber Medtronic PCM  . Chest pain 12/10    Lexiscan myoview with EF 55%, no ischemia or infarction  . Pacemaker  medtronic 02/06/2010    Qualifier: Diagnosis of  By: Lovena Le, MD, Martyn Malay   . Acute  diverticulitis 12/15    [place]  . Acute diverticulitis     Past Surgical History  Procedure Laterality Date  . Vasectomy  in his 80's  . Dual chamber pacemaker  01/11    Dr. Lovena Le  . Laparoscopic cholecystectomy  1/14    Dr Bary Castilla    Family History  Problem Relation Age of Onset  . Hypertension Mother   . Alcohol abuse Father     History   Social History  . Marital Status: Married    Spouse Name: N/A  . Number of Children: 3  . Years of Education: N/A   Occupational History  . Retired     Hydrologist   Social History Main Topics  . Smoking status: Former Research scientist (life sciences)  . Smokeless tobacco: Never Used     Comment: In [employer], quit in 1960's  . Alcohol Use: No  . Drug Use: No  . Sexual Activity: Not on file   Other Topics Concern  . Not on file   Social History Narrative   2 Daughters, one son killed in [place]      Has living will   Wife, then daughter Maudie Mercury, is health care POA   Would accept resuscitation attempts but no prolonged life support.  No tube feeding if cognitively unaware   Review of Systems No urinary problems Did try miralax about a year ago and didn't seem to help him after a week    Objective:   Physical Exam  Constitutional: He appears well-developed and well-nourished. No distress.  Pulmonary/Chest: Effort normal and breath sounds normal. No respiratory distress. He has no wheezes. He has no rales.  Abdominal: Soft. Bowel sounds are normal. He exhibits no distension. There is no tenderness. There is no rebound and no guarding.          Assessment & Plan:

## 2015-01-10 NOTE — Progress Notes (Signed)
Pre visit review using our clinic review tool, if applicable. No additional management support is needed unless otherwise documented below in the visit note. 

## 2015-01-10 NOTE — Assessment & Plan Note (Signed)
Some overflow now Needs daily regimen---discussed and wrote out regimen

## 2015-01-28 NOTE — Consult Note (Signed)
Brief Consult Note: Diagnosis: atypical chest pain.   Patient was seen by consultant.   Discussed with Attending MD.   Comments: CP free now. ECG without any ischemic changes. No recent exertional symptoms.  OK to discharge home. He has a follow up in 1 week. I will notify Dr. Lovena Le.  Electronic Signatures: Kathlyn Sacramento (MD)  (Signed 31-Jan-14 16:08)  Authored: Brief Consult Note   Last Updated: 31-Jan-14 16:08 by Kathlyn Sacramento (MD)

## 2015-01-28 NOTE — Op Note (Signed)
PATIENT NAME:  [name], [name] MR#:  [id number] DATE OF BIRTH:  January 16, 1933  DATE OF PROCEDURE:  11/07/2012  PREOPERATIVE DIAGNOSIS: Chronic cholecystitis and cholelithiasis.   POSTOPERATIVE DIAGNOSIS:  Chronic cholecystitis and cholelithiasis.  PROCEDURE PERFORMED: Laparoscopic cholecystectomy.   SURGEON: Hervey Ard, MD  ANESTHESIA: General endotracheal under Dr. Ronelle Nigh.   ESTIMATED BLOOD LOSS: Less than 5 mL.   CLINICAL NOTE: This 81 year old male had intermittent upper abdominal pain in the postprandial period. Ultrasound showed evidence of cholelithiasis. He was felt to be a candidate for elective cholecystectomy.   OPERATIVE NOTE: With the patient under adequate general endotracheal anesthesia, the abdomen was prepped with ChloraPrep and draped. With the patient in Trendelenburg position, a Veress needle was placed through a transumbilical incision. After assuring intra-abdominal location with the hanging drop test, the abdomen was insufflated with CO2 at 10 mmHg pressure. A 10 mm port was expanded and inspection showed no evidence of injury from initial port placement. The patient was placed in reverse Trendelenburg position and rolled to the left. An 11 mm Xcel port was placed in the epigastric area and 2 5-mm step ports placed on the right lateral abdominal wall. The patient's gallbladder was placed on cephalad traction. Minimal inflammatory changes were noted. The common bile duct was well visualized. The cystic duct was cleared. Kumar clamp was placed and using 12 mL of one half strength Conray 60, cholangiograms were obtained. This showed prompt filling of the right and left hepatic ducts, free flow in the common bile duct and free flow into the duodenum. No evidence of retained stones. The cystic duct and cystic artery branches were doubly clipped and divided. The gallbladder was removed from the liver bed making use of hook cautery dissection. It was then delivered through the  umbilical port site. Inspection from the epigastric port site showed no evidence of injury from initial port placement. The right upper quadrant was inspected. Good hemostasis was noted.   The area was irrigated with lactated Ringer's solution. The abdomen was then desufflated and ports removed under direct vision. The fascia at the umbilicus where a small umbilical hernia had been noted was repaired with a single 0 Vicryl figure-of-eight suture. The skin incisions were closed with 4-0 Vicryl subcuticular sutures. Benzoin, Steri-Strips, Telfa and Tegaderm dressing were then applied.   The patient tolerated the procedure well and was taken to the recovery room in stable condition     ____________________________ [name], MD jwb:ct D: 11/08/2012 10:40:00 ET T: 11/09/2012 13:59:02 ET JOB#: [id number]  cc: [name], MD, <Dictator> [name], MD Tyrez Berrios Amedeo Kinsman MD ELECTRONICALLY SIGNED 11/10/2012 12:46

## 2015-02-02 NOTE — H&P (Signed)
PATIENT NAME:  [name], OLANO MR#:  [id number] DATE OF BIRTH:  Sep 27, 1933  DATE OF ADMISSION:  10/05/2014  PRIMARY CARE PHYSICIAN:  [name], MD   CHIEF COMPLAINT:  Cramping abdominal pain since morning.   HISTORY OF PRESENT ILLNESS: [name] is a 81 year old Caucasian gentleman with history of hypertension, history of arrhythmia status post pacemaker, on chronic anticoagulation, comes to the Emergency Room accompanied with his wife with complaints of ongoing abdominal pain that started initially, cramping in the left lower quadrant, now is generalized cramping, significant to the point the patient is rolling up and hollering with pain during my evaluation. He received 2 doses of IV morphine. Evaluation in the Emergency Room, he is noted to have acute sigmoid diverticulitis. He is currently getting Cipro and Flagyl. Admit the patient for further evaluation and management.   PAST MEDICAL HISTORY: 1.  Hypertension.  2.  History of arrhythmia, status post pacemaker. The patient is on p.o. Eliquis. He follows up with Dr. Jens Som with [facility].   PAST SURGICAL HISTORY:  1.  Cholecystectomy.  2.  Appendectomy.  ALLERGIES: No known drug allergies.   SOCIAL HISTORY: Married, lives with wife at home. Nonsmoker, nonalcoholic.   REVIEW OF SYSTEMS:  CONSTITUTIONAL: No fever, fatigue, weakness.  EYES: No blurred or double vision, glaucoma.  ENT: No tinnitus, ear pain, hearing loss, or snoring.  RESPIRATORY: No cough, wheeze, hemoptysis, or dyspnea.  CARDIOVASCULAR: No chest pain, orthopnea, edema.  GASTROINTESTINAL: Positive for abdominal pain. Positive for nausea, but no vomiting or diarrhea. No rectal bleed.  GENITOURINARY: No dysuria, hematuria, frequency.  ENDOCRINE: No polyuria, nocturia, or thyroid problems.  HEMATOLOGY: No anemia or easy bruising.  SKIN: No acne or rash.  MUSCULOSKELETAL: Positive for arthritis and back pain.  NEUROLOGIC: No CVA, TIA, dysarthria, or ataxia.   PSYCHIATRIC: No anxiety or depression. All other systems reviewed are negative.   PHYSICAL EXAMINATION: GENERAL: The patient is awake, alert, oriented x3 in mild to moderate distress due to abdominal pain.  VITAL SIGNS: Afebrile. Pulse is 61, blood pressure is 143/75, sats are 95% on 2 liters.  HEENT: Atraumatic, normocephalic. Pupils PERRLA, EOM intact. Oral mucosa is moist.  NECK: Supple. No JVD. No carotid bruit.  RESPIRATORY: Clear to auscultation bilaterally. No rales, rhonchi, respiratory distress, or labored breathing.  CARDIOVASCULAR: Both the heart sounds are normal. Rate rhythm is regular. PMI not lateralized. Chest is nontender.  EXTREMITIES: Good pedal pulses, good femoral pulses. No lower extremity edema.  ABDOMEN: Soft. There is generalized diffuse tenderness present. No guarding or rigidity. No mass felt. Bowel sounds are hypoactive.  NEUROLOGICAL: Grossly intact cranial nerves II through XII. No motor or sensory deficit.  PSYCHIATRIC: The patient is awake, alert, oriented x3. Mood and affect normal. He is somewhat anxious because of the abdominal pain.  SKIN: Warm and dry.   LABORATORY DATA: CT of the abdomen and pelvis with contrast showed: 1.  Acute diverticulitis involving the proximal descending colon. No free air or abscess is identified. Left lower lobe nodular measures 4 mm. Follow-up is recommended.  2.  Atherosclerosis 3.  Liver cirrhosis.  4.  Prostate gland enlargement. 5.  Lumbar degenerative disk disease.   Urinalysis:  Negative for UTI. H and H is 14.8 and 43.9, white count is 11.1. Comprehensive metabolic panel within normal limits. Lipase is 128.   ASSESSMENT AND PLAN: A 81 year old, [name], with history of arrhythmia, status post pacemaker, hypertension, comes in with abdominal pain, which started this morning, cramping  in nature. He is being admitted with:  1.  Acute proximal descending colon diverticulitis. The patient will be kept on clear liquid diet.  We will continue IV fluids for hydration. IV Cipro, Flagyl. GI consultation only if the patient's symptoms do not improve. We will give p.r.n. Dilaudid and p.o. Norco for pain control and p.r.n. Zofran for nausea.  2.  History of arrhythmia. The patient is status post pacemaker in the past. Continue flecainide and diltiazem. He is on chronic anticoagulation with Eliquis. The patient is not having any diarrheal stools at present. We will continue his anticoagulation.  3.  Deep vein thrombosis prophylaxis. The patient is already on Eliquis.  4.  Further work-up according to the patient's clinical course. Hospital admission plan was discussed with the patient, the patient's wife who was present in the Emergency Room.   TIME SPENT:  50 minutes    ____________________________ Gus Height A. Posey Pronto, MD sap:mw D: 10/05/2014 18:45:50 ET T: 10/05/2014 18:58:49 ET JOB#: [id number]  cc: Kyannah Climer A. Posey Pronto, MD, <Dictator> [name], MD Ilda Basset MD ELECTRONICALLY SIGNED 10/12/2014 11:09

## 2015-02-06 NOTE — Discharge Summary (Signed)
PATIENT NAME:  [name], [name] MR#:  [id number] DATE OF BIRTH:  10/17/1932  DATE OF ADMISSION:  10/05/2014 DATE OF DISCHARGE:  10/06/2014  ADMITTING DIAGNOSIS: Diverticulitis.   DISCHARGE DIAGNOSES:  1. Acute proximal descending colon diverticulitis with severe pain, resolving.  2. Constipation, chronic.  3. Chest pain, suspected radiation from abdomen.  4. Hypertension.  5. History of atrial fibrillation.   DISCHARGE CONDITION: Stable.   DISCHARGE MEDICATIONS: The patient is to continue:  1. Diltiazem hydrochloride extended release 360 mg 1 capsule once daily.  2. Tamsulosin 0.1 mg p.o. daily.  3. Flecainide 100 mg twice daily.  4. Eliquis 5 mg  p.o. twice daily.  5. Tylenol with hydrocodone 325/5 mg 1 tablet every 4 hours as needed.  6. Flagyl 500 mg p.o. every 8 hours.  7. Ciprofloxacin 500 mg p.o. every 12 hours.  8. Docusate sodium 100 mg p.o. twice daily.  9. Senna 1 tablet once daily.   HOME OXYGEN: None.   DIET: Low-salt, low-fat, low-cholesterol. Diet consistency will be pureed. The patient was advised to continue a soft, liquidy diet for the next few days and advance to regular diet as tolerated.   ACTIVITY LIMITATIONS: As tolerated.   FOLLOWUP APPOINTMENTS: Dr. Silvio Pate in 2 days after discharge, Dr. Cleda Mccreedy in 2 days after discharge for outpatient stress test, also in 1 to 2 weeks, a GI on call.    CONSULTANTS: Care management, social work.   RADIOLOGIC STUDIES: Chest x-ray, portable, single-view on 10/05/2014, showed cardiomegaly with no acute cardiopulmonary disease.   CT scan of the abdomen and pelvis with contrast on 10/05/2014, revealing:  1. Findings compatible with acute diverticulitis involving the proximal descending colon. No free air or abscess was identified. No evidence of bowel obstruction. Left lower lobe nodule was noted also measuring 4 mm and the patient is at high risk for bronchogenic carcinoma. Follow-up chest CT at 1 year was recommended. If the  patient is at low risk, no follow-up is needed. This recommendation as well as the consensus statement, guidelines for management of small pulmonary nodules detected on CT scan, a statement from the [facility] as published in radiology in 2005.  2. Atherosclerosis;  3. Liver cyst.  4. Prostate gland enlargement.  5. Lumbar degenerative disk disease.   The patient is a 81 year old male with past medical history significant for chronic constipation, who presents to the hospital with complaints of cramping abdominal pain since the morning of admission. Please refer to Dr. Gus Height Patel's admission note on 10/05/2014. On arrival to the hospital, the patient was afebrile, pulse was 61, blood pressure 143/75, saturation was 95% on 3 liters of oxygen via nasal cannula. Physical exam revealed diffuse generalized tenderness, no guarding or rigidity, no masses felt and bowel sounds were hypoactive. The patient's lab data done on arrival to the hospital revealed elevated glucose level of 102, lipase 128, otherwise BMP was normal. The patient's liver enzymes were normal. Cardiac enzymes, first set, was negative. White blood cell count was initially elevated at 11.1, hemoglobin was 14.8, platelet count was 239,000. Absolute neutrophil count was elevated at 8. Urinalysis was unremarkable. The patient's EKG showed electronic atrial pacemaker, left axis deviation, junctional ST depression, probably normal, and no significant change since prior EKG. The patient was admitted to the hospital for further evaluation and started on antibiotic therapy as well as IV fluids and measures for constipation management. With this, his condition improved and he was ready to be discharged home today, 10/06/2014. On the  day of discharge, temperature was 98.7, pulse was 60, respiration rate was 18, blood pressure 116/66, saturation was 95% on room air at rest. It was felt that the patient should continue antibiotic therapy to complete the  course. He is also to continue medications help him with severe constipation, which likely is at least indirect cause of his diverticulitis. For constipation, he is to continue medications to help him resolve his constipation including over-the-counter whichever works. In regards to chest pains, it was felt that the patient's chest pain was radiation from abdomen. His cardiac enzymes were normal. The patient was advised follow up with his primary care cardiologist, Dr. Caryl Comes, in the next few days after discharge for outpatient stress test. In regards to his history of hypertension as well as atrial fibrillation, the patient is to continue his outpatient management. No changes were made. The patient is being discharged in stable condition with the above-mentioned medications and followup.   TIME SPENT: 30 minutes.     ____________________________ Theodoro Grist, MD rv:lm D: 10/11/2014 20:33:50 ET T: 10/12/2014 04:22:45 ET JOB#: [id number]  cc: Theodoro Grist, MD, <Dictator> (Dictation Anomaly) Juan Creveling MD ELECTRONICALLY SIGNED 10/21/2014 17:19

## 2015-02-08 IMAGING — CT CT ABD-PELV W/ CM
2 of 5 series · 15 of 46 positions shown, 17 images · IV contrast (omnipaque)
Comparison: 11/07/2012

CLINICAL DATA: Sudden onset of lower abdominal pain

EXAM:
CT ABDOMEN AND PELVIS WITH CONTRAST
TECHNIQUE: Multidetector CT imaging of the abdomen and pelvis was performed
using the standard protocol following bolus administration of
intravenous contrast.
CONTRAST:  100 cc of Omnipaque 300

[Series 2: routine abd pel with · axial · 0.80mm/px · z∈[-930,-506]mm · 12 of 97 slices shown, 14 images]
[im 6/97  soft-tissue]
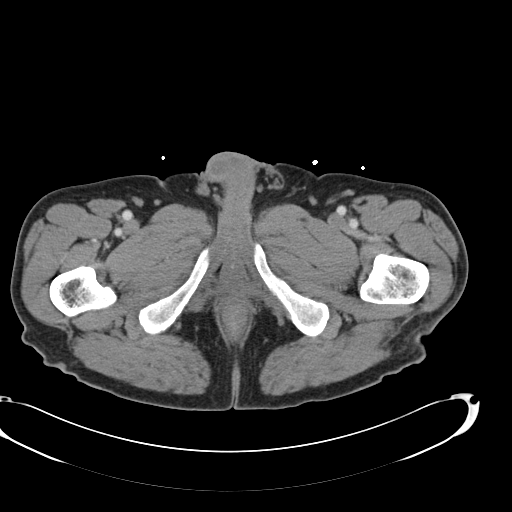
[im 6/97  bone]
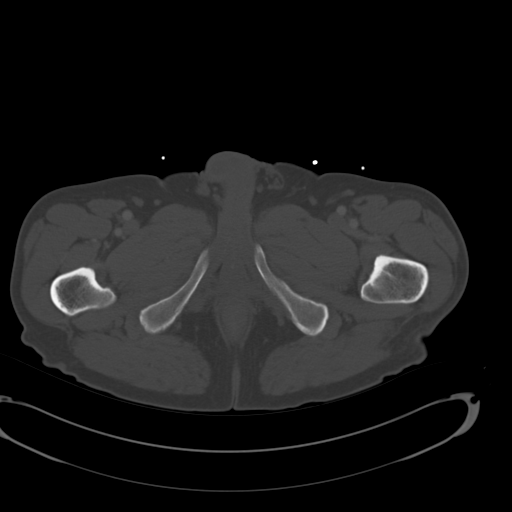
[im 16/97  soft-tissue]
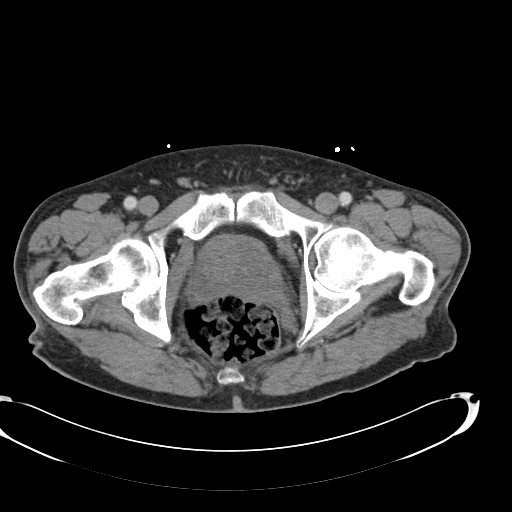
[im 21/97  soft-tissue]
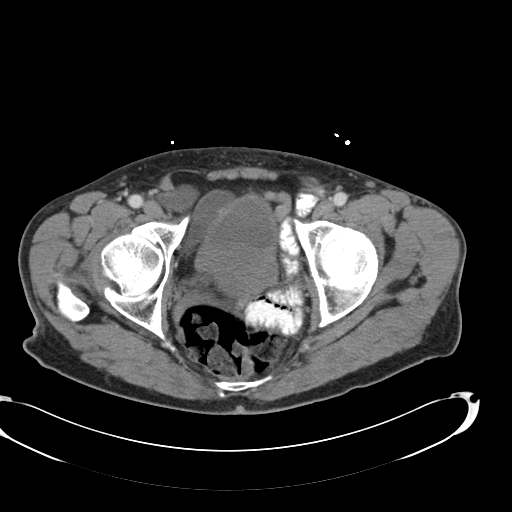
[im 31/97  soft-tissue]
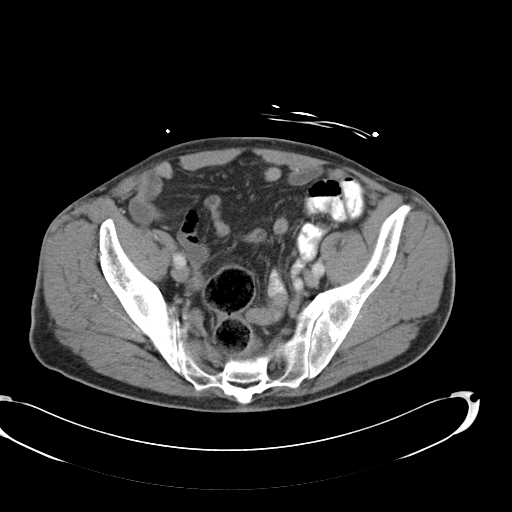
[im 36/97  soft-tissue]
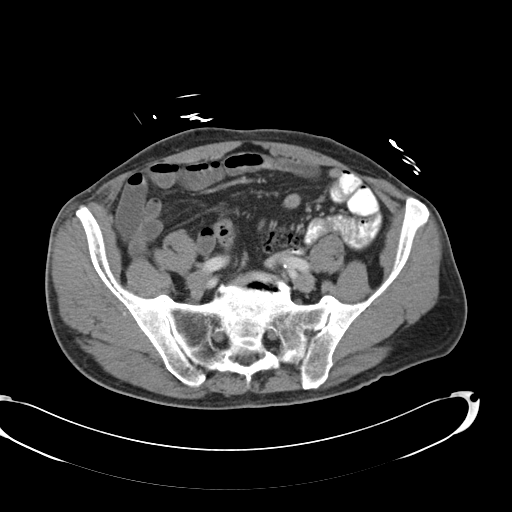
[im 46/97  soft-tissue]
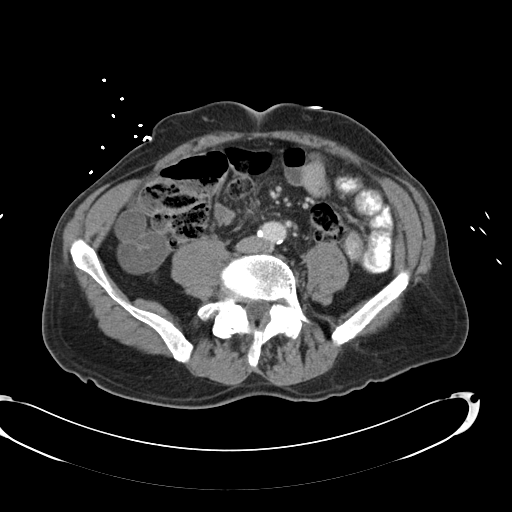
[im 51/97  soft-tissue]
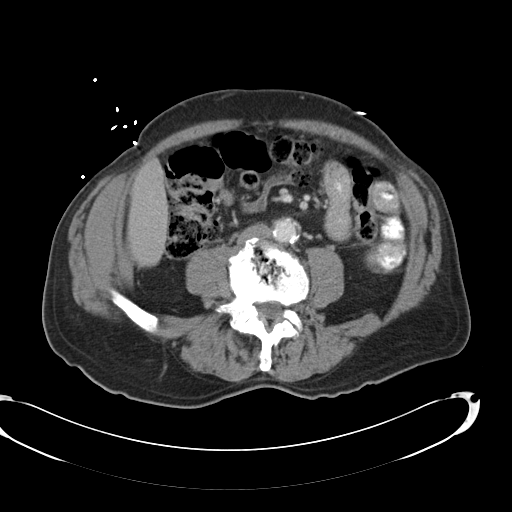
[im 61/97  soft-tissue]
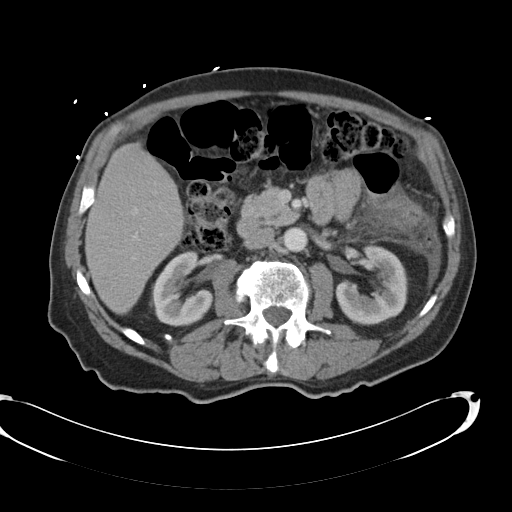
[im 66/97  soft-tissue]
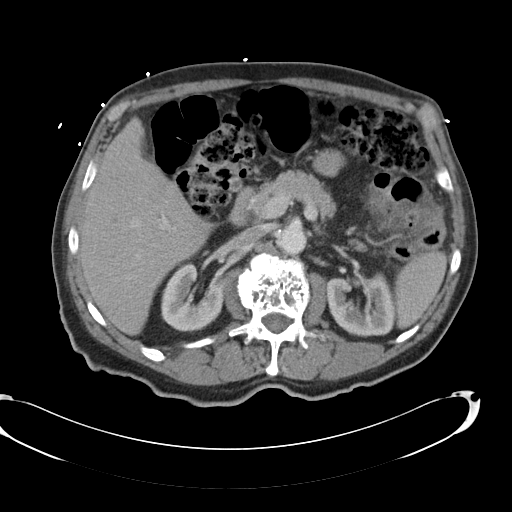
[im 66/97  bone]
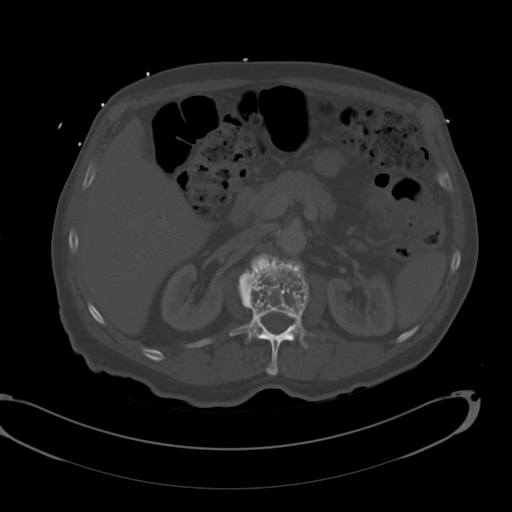
[im 76/97  soft-tissue]
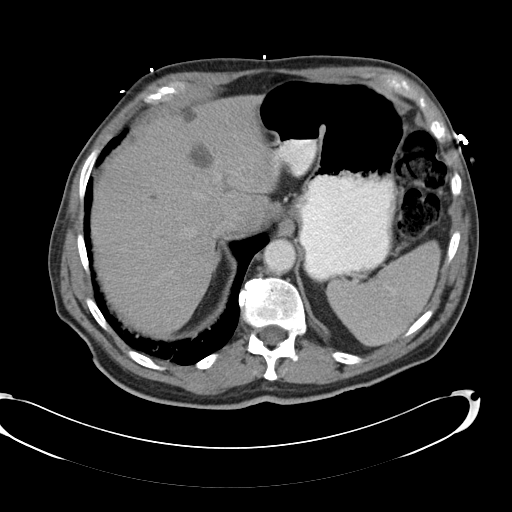
[im 81/97  soft-tissue]
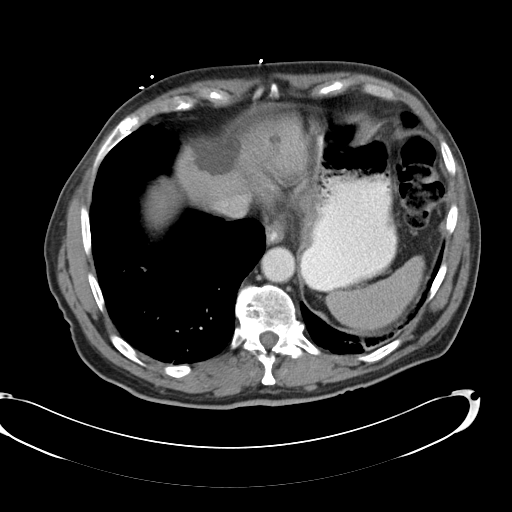
[im 91/97  soft-tissue]
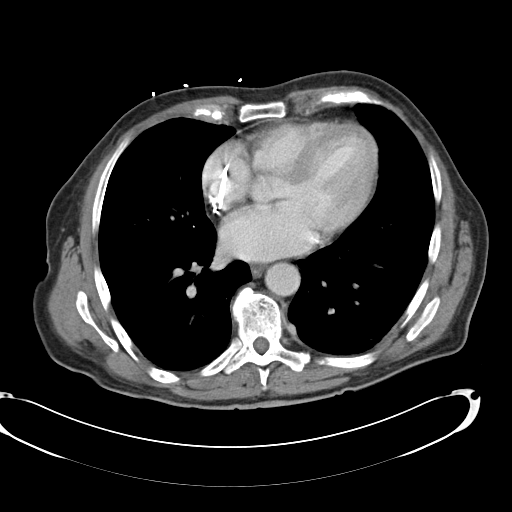

[Series 5: cor routine abd pel with · coronal · 0.74mm/px · 3 of 152 slices shown]
[im 51/152  soft-tissue]
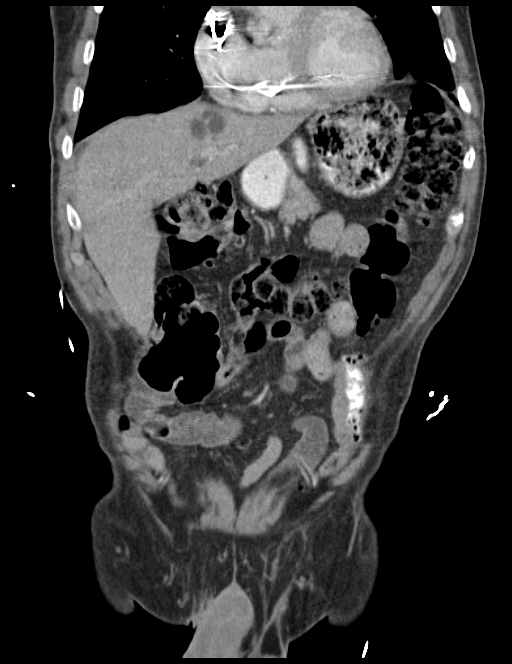
[im 68/152  soft-tissue]
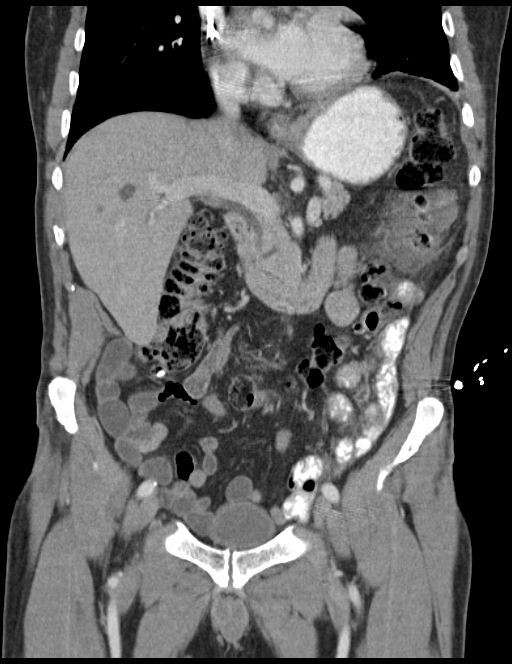
[im 84/152  soft-tissue]
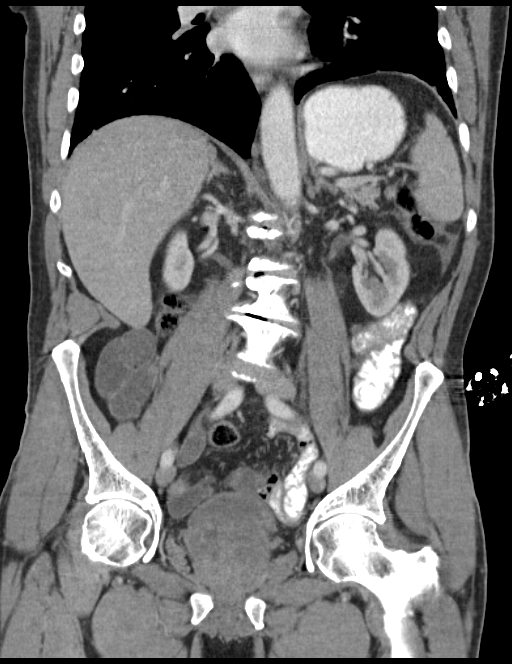

[15 of 46 positions shown; findings below may reference images not displayed]

FINDINGS: Lower chest: There is a pulmonary nodule in the left lower lobe
measuring 4 mm, image 11/series 4.

Hepatobiliary: Multiple fluid attenuating structures are identified
within the liver, likely cysts. Previous cholecystectomy. No
significant biliary dilatation.

Pancreas: The pancreas appears normal.

Spleen: The spleen appears normal.

Adrenals/Urinary Tract: Normal adrenal glands. The kidneys are both
normal. The urinary bladder appears normal.

Stomach/Bowel: The stomach is unremarkable. The small bowel loops
have a normal course and caliber. Multiple colonic diverticula
noted. There is marked inflammation involving the proximal
descending colon including wall thickening, fat stranding and free
fluid. Findings most likely reflect acute diverticulitis. No
significant free intraperitoneal air. No discrete fluid collections
are identified.

Vascular/Lymphatic: Calcified atherosclerotic disease involves the
abdominal aorta. No aneurysm. No enlarged retroperitoneal or
mesenteric adenopathy. No enlarged pelvic or inguinal lymph nodes.

Reproductive: Marked prostate gland enlargement noted.

Other: No discrete fluid collections identified.

Musculoskeletal: Multi level degenerative disc disease identified
throughout the lumbar spine. Large vertebral hemangioma involves the
L1 vertebra.
IMPRESSION: 1. Findings are compatible with acute diverticulitis involving the
proximal descending colon. No free air or abscess identified. No
evidence for bowel obstruction.
2. Left lower lobe nodule measures 4 mm. If the patient is at high
risk for bronchogenic carcinoma, follow-up chest CT at 1 year is
recommended. If the patient is at low risk, no follow-up is needed.
This recommendation follows the consensus statement: Guidelines for
Management of Small Pulmonary Nodules Detected on CT Scans: A
Statement from the [HOSPITAL] as published in Radiology
3440; [DATE].
3. Atherosclerosis.
4. Liver cysts
5. Prostate gland enlargement.
6. Lumbar degenerative disc disease.

## 2015-02-08 IMAGING — CR DG CHEST 1V PORT
1 series · 1 of 1 positions shown · non-contrast
Comparison: 11/06/2012 chest radiograph

CLINICAL DATA: Acute chest pain.  Initial encounter.

EXAM:
PORTABLE CHEST - 1 VIEW

[ap]
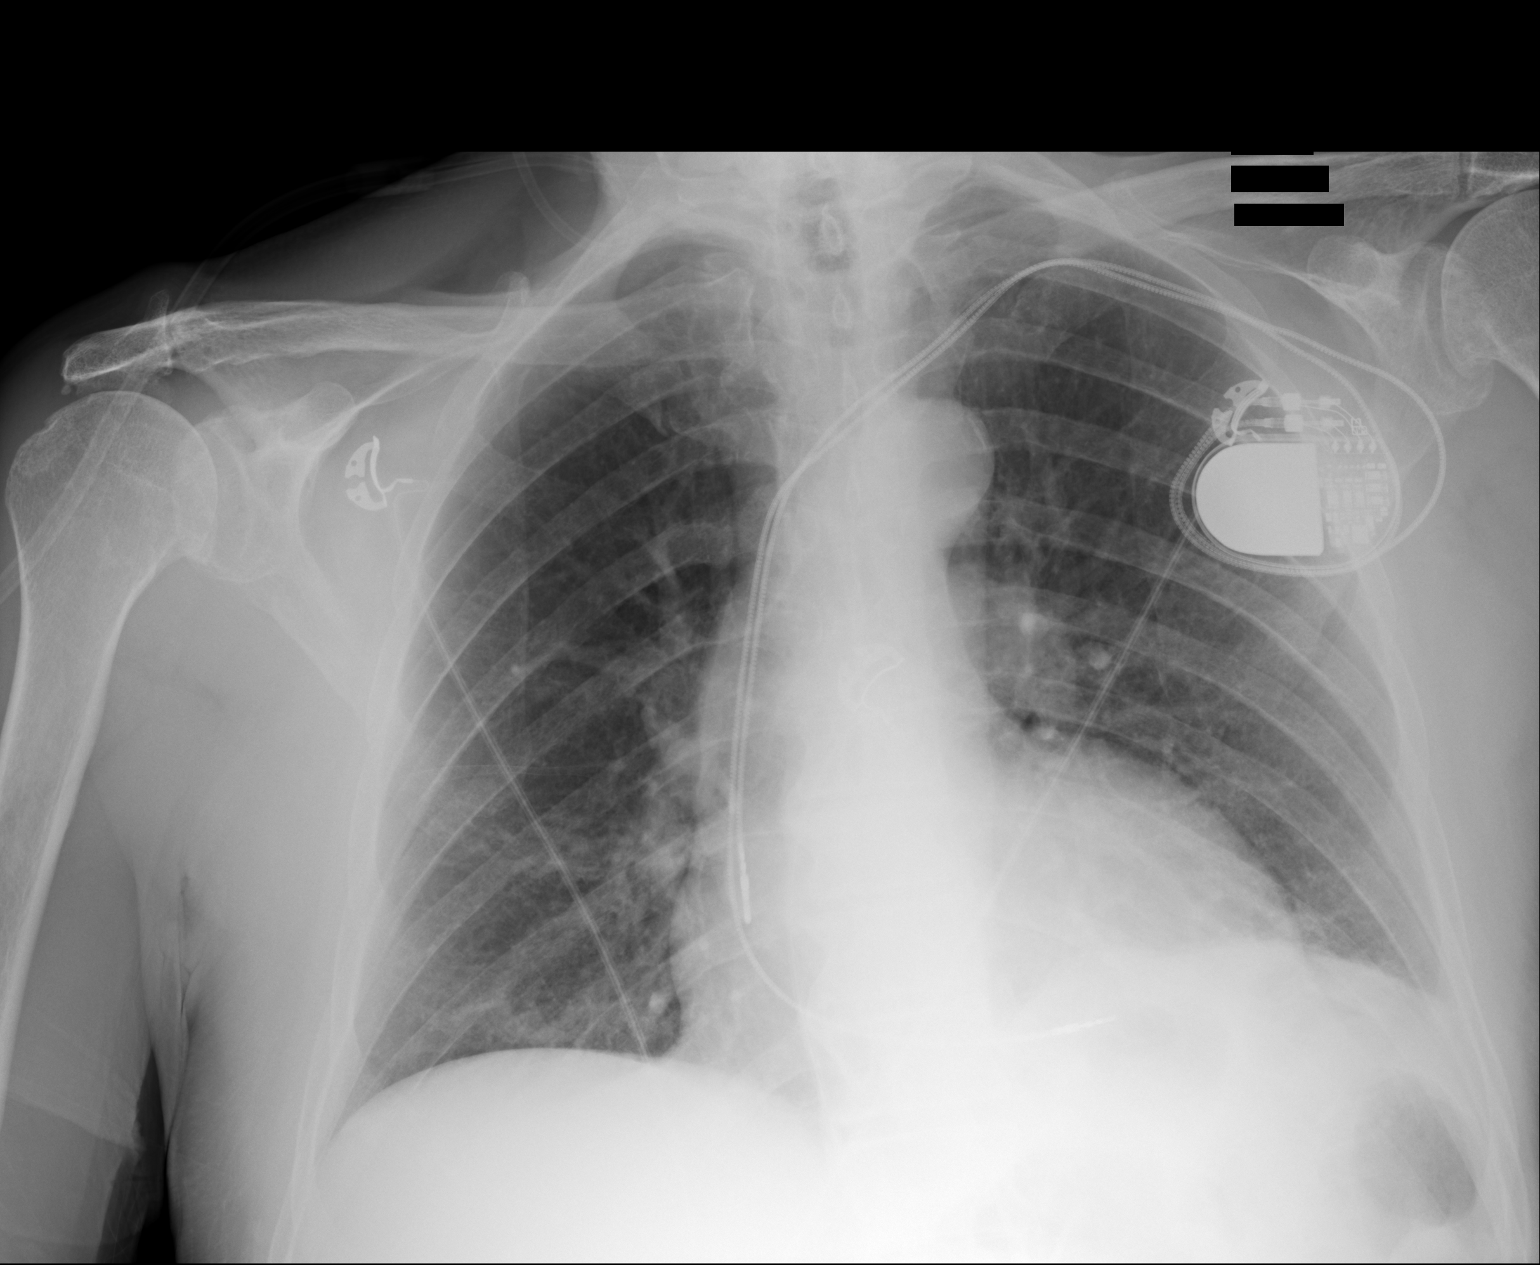

[1 of 1 positions shown; findings below may reference images not displayed]

FINDINGS: Cardiomegaly and left-sided pacemaker again noted.

Mild elevation of left hemidiaphragm is unchanged.

There is no evidence of focal airspace disease, pulmonary edema,
suspicious pulmonary nodule/mass, pleural effusion, or pneumothorax.
No acute bony abnormalities are identified.
IMPRESSION: Cardiomegaly without acute cardiopulmonary disease.

## 2015-03-02 ENCOUNTER — Other Ambulatory Visit: Payer: Self-pay | Admitting: *Deleted

## 2015-03-02 MED ORDER — APIXABAN 5 MG PO TABS: 5.0000 mg | ORAL_TABLET | Freq: Two times a day (BID) | ORAL | Status: DC

## 2015-03-21 ENCOUNTER — Other Ambulatory Visit: Payer: Self-pay | Admitting: Internal Medicine

## 2015-03-21 NOTE — Telephone Encounter (Signed)
Approved: Okay #[phone] x 0 

## 2015-03-21 NOTE — Telephone Encounter (Signed)
10/11/2014 

## 2015-03-22 NOTE — Telephone Encounter (Signed)
rx called into pharmacy

## 2015-04-27 ENCOUNTER — Encounter: Payer: Medicare Other | Admitting: Internal Medicine

## 2015-04-28 ENCOUNTER — Other Ambulatory Visit: Payer: Self-pay | Admitting: *Deleted

## 2015-04-28 MED ORDER — DILTIAZEM HCL ER BEADS 360 MG PO CP24: ORAL_CAPSULE | ORAL | Status: DC

## 2015-04-29 ENCOUNTER — Encounter: Payer: Self-pay | Admitting: Internal Medicine

## 2015-04-29 ENCOUNTER — Ambulatory Visit (INDEPENDENT_AMBULATORY_CARE_PROVIDER_SITE_OTHER): Payer: Medicare Other | Admitting: Internal Medicine

## 2015-04-29 VITALS — BP 138/80 | HR 67 | Temp 98.4°F | Ht 71.0 in | Wt 170.0 lb

## 2015-04-29 DIAGNOSIS — I10 Essential (primary) hypertension: Secondary | ICD-10-CM | POA: Diagnosis not present

## 2015-04-29 DIAGNOSIS — E785 Hyperlipidemia, unspecified: Secondary | ICD-10-CM | POA: Diagnosis not present

## 2015-04-29 DIAGNOSIS — I4891 Unspecified atrial fibrillation: Secondary | ICD-10-CM | POA: Diagnosis not present

## 2015-04-29 DIAGNOSIS — Z0000 Encounter for general adult medical examination without abnormal findings: Secondary | ICD-10-CM | POA: Diagnosis not present

## 2015-04-29 DIAGNOSIS — N400 Enlarged prostate without lower urinary tract symptoms: Secondary | ICD-10-CM | POA: Diagnosis not present

## 2015-04-29 DIAGNOSIS — Z7189 Other specified counseling: Secondary | ICD-10-CM

## 2015-04-29 LAB — CBC WITH DIFFERENTIAL/PLATELET
BASOS ABS: 0 10*3/uL (ref 0.0–0.1)
BASOS PCT: 0.6 % (ref 0.0–3.0)
Eosinophils Absolute: 0.1 10*3/uL (ref 0.0–0.7)
Eosinophils Relative: 1.2 % (ref 0.0–5.0)
HEMATOCRIT: 41.8 % (ref 39.0–52.0)
Hemoglobin: 14.1 g/dL (ref 13.0–17.0)
LYMPHS ABS: 1.9 10*3/uL (ref 0.7–4.0)
Lymphocytes Relative: 36.9 % (ref 12.0–46.0)
MCHC: 33.6 g/dL (ref 30.0–36.0)
MCV: 93.7 fl (ref 78.0–100.0)
MONO ABS: 0.5 10*3/uL (ref 0.1–1.0)
Monocytes Relative: 9.1 % (ref 3.0–12.0)
Neutro Abs: 2.7 10*3/uL (ref 1.4–7.7)
Neutrophils Relative %: 52.2 % (ref 43.0–77.0)
Platelets: 276 10*3/uL (ref 150.0–400.0)
RBC: 4.46 Mil/uL (ref 4.22–5.81)
RDW: 13.7 % (ref 11.5–15.5)
WBC: 5.2 10*3/uL (ref 4.0–10.5)

## 2015-04-29 LAB — COMPREHENSIVE METABOLIC PANEL
ALT: 10 U/L (ref 0–53)
AST: 17 U/L (ref 0–37)
Albumin: 3.9 g/dL (ref 3.5–5.2)
Alkaline Phosphatase: 65 U/L (ref 39–117)
BILIRUBIN TOTAL: 0.5 mg/dL (ref 0.2–1.2)
BUN: 18 mg/dL (ref 6–23)
CHLORIDE: 107 meq/L (ref 96–112)
CO2: 27 mEq/L (ref 19–32)
Calcium: 9 mg/dL (ref 8.4–10.5)
Creatinine, Ser: 1 mg/dL (ref 0.40–1.50)
GFR: 76.36 mL/min (ref >60.00)
GLUCOSE: 91 mg/dL (ref 70–99)
Potassium: 4.6 mEq/L (ref 3.5–5.1)
Sodium: 139 mEq/L (ref 135–145)
Total Protein: 7.3 g/dL (ref 6.0–8.3)

## 2015-04-29 LAB — LIPID PANEL
Cholesterol: 183 mg/dL (ref 0–200)
HDL: 53.4 mg/dL (ref >39.00)
LDL Cholesterol: 122 mg/dL — ABNORMAL HIGH (ref 0–99)
NONHDL: 129.6
Total CHOL/HDL Ratio: 3
Triglycerides: 38 mg/dL (ref 0.0–149.0)
VLDL: 7.6 mg/dL (ref 0.0–40.0)

## 2015-04-29 LAB — T4, FREE: Free T4: 0.7 ng/dL (ref 0.60–1.60)

## 2015-04-29 NOTE — Progress Notes (Signed)
Subjective:    Patient ID: [name], male    DOB: 1932-12-21, 81 y.o.   MRN: [id number]  HPI Here for Medicare wellness and follow up of chronic medical conditions Reviewed form and advanced directives Reviewed other doctors No falls No depression or anhedonia No tobacco or alcohol Regularly exercises Vision is fine-- wears glasses for driving Mild hearing loss--hearing aides not much help Independent with instrumental ADLs No significant memory issues  Bowels are better Using the miralax  He has no new concerns Will get episodic sleep problems--- uses it only rarely No anxiety issues  Keeps up with Dr Caryl Comes Stress test reassuring in past year Will rarely get brief palpitation--can feel the a fib at times but not often No chest pain No SOB No dizziness or syncope No edema  Voiding okay Nocturia x 3 usually No major daytime issues  Occasional acid reflux Uses ranitidine once a week or so--depends on what he eats No swallowing issues  Current Outpatient Prescriptions on File Prior to Visit  Medication Sig Dispense Refill  . acetaminophen (TYLENOL) 325 MG tablet Take 650 mg by mouth every 6 (six) hours as needed.    . ALPRAZolam (XANAX) 0.25 MG tablet Take 1 tablet (0.25 mg total) by mouth 2 (two) times daily as needed for anxiety. 60 tablet 0  . apixaban (ELIQUIS) 5 MG TABS tablet Take 1 tablet (5 mg total) by mouth 2 (two) times daily. 60 tablet 3  . diltiazem (TIAZAC) 360 MG 24 hr capsule TAKE ONE CAPSULE BY MOUTH EVERY DAY 30 capsule 3  . flecainide (TAMBOCOR) 100 MG tablet Take 1 tablet (100 mg total) by mouth 2 (two) times daily. 180 tablet 3  . Glucosamine 500 MG CAPS Take by mouth daily.      . ranitidine (ZANTAC) 150 MG tablet Take 150 mg by mouth as needed. Before main meal    . tamsulosin (FLOMAX) 0.4 MG CAPS capsule TAKE ONE CAPSULE BY MOUTH EVERY DAY 30 capsule 11   No current facility-administered medications on file prior to visit.    No Known  Allergies  Past Medical History  Diagnosis Date  . Hyperlipemia   . Hypertension   . Osteoarthritis   . BPH (benign prostatic hyperplasia)   . Allergic rhinitis   . Atrial fibrillation or flutter 12/10    Has been paroxysmal. Patient has not tolerated coumadin due to headaches. ECHO (12/10) Mild LVH, EF 55%,   . Sick sinus syndrome     S/P dual chamber Medtronic PCM  . Chest pain 12/10    Lexiscan myoview with EF 55%, no ischemia or infarction  . Pacemaker  medtronic 02/06/2010    Qualifier: Diagnosis of  By: Lovena Le, MD, Martyn Malay   . Acute diverticulitis 12/15    [facility]  . Acute diverticulitis     Past Surgical History  Procedure Laterality Date  . Vasectomy  in his 89's  . Dual chamber pacemaker  01/11    Dr. Lovena Le  . Laparoscopic cholecystectomy  1/14    Dr Bary Castilla    Family History  Problem Relation Age of Onset  . Hypertension Mother   . Alcohol abuse Father     History   Social History  . Marital Status: Married    Spouse Name: N/A  . Number of Children: 3  . Years of Education: N/A   Occupational History  . Retired     Hydrologist   Social History Main Topics  . Smoking  status: Former Research scientist (life sciences)  . Smokeless tobacco: Never Used     Comment: In [employer], quit in 1960's  . Alcohol Use: No  . Drug Use: No  . Sexual Activity: Not on file   Other Topics Concern  . Not on file   Social History Narrative   2 Daughters, one son killed in [place]      Has living will   Wife, then daughter Maudie Mercury, is health care POA   Would accept resuscitation attempts but no prolonged life support.   No tube feeding if cognitively unaware   Review of Systems Sleeps well in general Wears seat belt Teeth okay--regular with dentist No sig back or arthritis pain. Feels the glucosamine has really helped (had bad right shoulder before) Left knee gives out some    Objective:   Physical Exam  Constitutional: He is oriented to person, place, and time. He appears  well-developed and well-nourished. No distress.  HENT:  Mouth/Throat: Oropharynx is clear and moist. No oropharyngeal exudate.  Neck: Normal range of motion. Neck supple. No thyromegaly present.  Cardiovascular: Normal rate, regular rhythm, normal heart sounds and intact distal pulses.  Exam reveals no gallop.   No murmur heard. Pulmonary/Chest: Effort normal and breath sounds normal. No respiratory distress. He has no wheezes. He has no rales.  Abdominal: Soft. There is no tenderness.  Musculoskeletal: He exhibits no edema or tenderness.  Slight ACL laxity in left knee. Normal otherwise  Lymphadenopathy:    He has no cervical adenopathy.  Neurological: He is alert and oriented to person, place, and time.  President-- "Obama, Bush, CLinton" [phone] D-l-r-[id number]-w Recall 2/3  Skin: No rash noted. No erythema.  Psychiatric: He has a normal mood and affect. His behavior is normal.          Assessment & Plan:

## 2015-04-29 NOTE — Progress Notes (Signed)
Pre visit review using our clinic review tool, if applicable. No additional management support is needed unless otherwise documented below in the visit note. 

## 2015-04-29 NOTE — Assessment & Plan Note (Signed)
Does okay with tamsulosin

## 2015-04-29 NOTE — Assessment & Plan Note (Signed)
BP Readings from Last 3 Encounters:  04/29/15 138/80  01/10/15 140/70  10/18/14 140/70   Good control No changes needed

## 2015-04-29 NOTE — Assessment & Plan Note (Signed)
Doesn't want primary prevention with statin Given recent benign stress test--this is reasonable

## 2015-04-29 NOTE — Assessment & Plan Note (Signed)
Paced On apixaban

## 2015-04-29 NOTE — Assessment & Plan Note (Signed)
I have personally reviewed the Medicare Annual Wellness questionnaire and have noted 1. The patient's medical and social history 2. Their use of alcohol, tobacco or illicit drugs 3. Their current medications and supplements 4. The patient's functional ability including ADL's, fall risks, home safety risks and hearing or visual             impairment. 5. Diet and physical activities 6. Evidence for depression or mood disorders  The patients weight, height, BMI and visual acuity have been recorded in the chart I have made referrals, counseling and provided education to the patient based review of the above and I have provided the pt with a written personalized care plan for preventive services.  I have provided you with a copy of your personalized plan for preventive services. Please take the time to review along with your updated medication list.  Yearly flu vaccine No cancer screening due to age Otherwise UTD Stays reasonably fit

## 2015-04-29 NOTE — Assessment & Plan Note (Signed)
See social history 

## 2015-05-02 ENCOUNTER — Encounter: Payer: Self-pay | Admitting: *Deleted

## 2015-05-18 ENCOUNTER — Ambulatory Visit (INDEPENDENT_AMBULATORY_CARE_PROVIDER_SITE_OTHER): Payer: Medicare Other | Admitting: *Deleted

## 2015-05-18 DIAGNOSIS — Z950 Presence of cardiac pacemaker: Secondary | ICD-10-CM

## 2015-05-18 DIAGNOSIS — I495 Sick sinus syndrome: Secondary | ICD-10-CM

## 2015-05-18 LAB — CUP PACEART INCLINIC DEVICE CHECK
Battery Remaining Longevity: 94 mo
Battery Voltage: 2.79 V
Brady Statistic AP VP Percent: 0 %
Brady Statistic AP VS Percent: 98 %
Brady Statistic AS VP Percent: 0 %
Lead Channel Impedance Value: 539 Ohm
Lead Channel Pacing Threshold Amplitude: 0.75 V
Lead Channel Pacing Threshold Amplitude: 1.75 V
Lead Channel Pacing Threshold Pulse Width: 0.4 ms
Lead Channel Sensing Intrinsic Amplitude: 5.6 mV
Lead Channel Setting Pacing Amplitude: 2 V
MDC IDC MSMT BATTERY IMPEDANCE: 302 Ohm
MDC IDC MSMT LEADCHNL RA IMPEDANCE VALUE: 515 Ohm
MDC IDC MSMT LEADCHNL RA PACING THRESHOLD PULSEWIDTH: 0.4 ms
MDC IDC MSMT LEADCHNL RA SENSING INTR AMPL: 2 mV
MDC IDC SESS DTM: 20160810092117
MDC IDC SET LEADCHNL RA PACING AMPLITUDE: 3.5 V
MDC IDC SET LEADCHNL RV PACING PULSEWIDTH: 0.4 ms
MDC IDC SET LEADCHNL RV SENSING SENSITIVITY: 2 mV
MDC IDC STAT BRADY AS VS PERCENT: 1 %

## 2015-05-18 NOTE — Progress Notes (Signed)
Pacemaker check in clinic. Normal device function. Thresholds, sensing, impedances consistent with previous measurements. Device programmed to maximize longevity. 442 mode switches--- 0.5% + eliquis. 11 high ventricular rates noted---EGMs show SVT. Device programmed at appropriate safety margins---changed RA output from 3.25 to 3.5V. Histogram distribution appropriate for patient activity level. Device programmed to optimize intrinsic conduction. Estimated longevity 88yrs. Due to see SK/B 11/16.

## 2015-06-28 ENCOUNTER — Encounter: Payer: Self-pay | Admitting: Internal Medicine

## 2015-08-01 ENCOUNTER — Other Ambulatory Visit: Payer: Self-pay | Admitting: Internal Medicine

## 2015-08-15 ENCOUNTER — Encounter: Payer: Self-pay | Admitting: Internal Medicine

## 2015-08-31 ENCOUNTER — Telehealth: Payer: Self-pay

## 2015-08-31 NOTE — Telephone Encounter (Signed)
S/w pt wife who states pt is not at home but she is aware of the phone call to [place] about Eliquis She reports Eliquis is too expensive and they are in the doughnut hole.  Needs a letter from Dr. Caryl Comes stating pt needs to be on Eliquis to give to the [place], who will then help them with the cost of the medication.  Informed pt wife I will work with them and Dr. Caryl Comes to provide information needed and call back. She verbalized understanding with no further questions.

## 2015-08-31 NOTE — Telephone Encounter (Signed)
Pt states his Eliquis is too expensive. Please call to discuss . States his insurance company will reduce the cost if Dr. Caryl Comes will write a letter stating he needs to take it, and that he has no side effects. Please call.

## 2015-09-05 ENCOUNTER — Telehealth: Payer: Self-pay

## 2015-09-05 NOTE — Telephone Encounter (Signed)
Pt presented to office in need of eliquis samples. Pt is past due for one year f/u. Refill authorized Oct 25 w/instructions to schedule appt. Appt moved up to 10/27/15 Medication Samples have been provided to the patient.  Drug name: Eliquis  Qty: 4 boxes  LOT: [id number]  Exp.Date: 1/19  The patient has been instructed regarding the correct time, dose, and frequency of taking this medication, including desired effects and most common side effects.   [name] 10:52 AM 09/05/2015

## 2015-09-12 ENCOUNTER — Telehealth: Payer: Self-pay

## 2015-09-12 NOTE — Telephone Encounter (Signed)
S/w pt who states

## 2015-09-12 NOTE — Telephone Encounter (Signed)
Pt has a question regarding his medication, Eliquis. Please call.

## 2015-09-12 NOTE — Telephone Encounter (Signed)
S/w pt who states free 30 day supply of Eliquis card did not work as he has already used it once.  Pt asking for one more week of samples to get him through December.  Pt agreeable w/picking up samples when letter from Dr. Caryl Comes is ready.

## 2015-09-13 ENCOUNTER — Telehealth: Payer: Self-pay

## 2015-09-13 NOTE — Telephone Encounter (Signed)
Informed pt that Eliquis letter and samples are ready at front desk for p/u. Pt states he will come by tomorrow and is appreciative of the call.

## 2015-10-20 ENCOUNTER — Ambulatory Visit (INDEPENDENT_AMBULATORY_CARE_PROVIDER_SITE_OTHER): Payer: Medicare Other

## 2015-10-20 DIAGNOSIS — Z23 Encounter for immunization: Secondary | ICD-10-CM

## 2015-10-27 ENCOUNTER — Encounter: Payer: Self-pay | Admitting: Internal Medicine

## 2015-10-27 ENCOUNTER — Ambulatory Visit (INDEPENDENT_AMBULATORY_CARE_PROVIDER_SITE_OTHER): Payer: Medicare Other | Admitting: Internal Medicine

## 2015-10-27 VITALS — BP 150/83 | HR 63 | Ht 70.5 in | Wt 178.2 lb

## 2015-10-27 DIAGNOSIS — I4891 Unspecified atrial fibrillation: Secondary | ICD-10-CM

## 2015-10-27 MED ORDER — LISINOPRIL 2.5 MG PO TABS: 2.5000 mg | ORAL_TABLET | Freq: Every day | ORAL | Status: DC

## 2015-10-27 NOTE — Progress Notes (Signed)
Patient Care Team: [name], MD as PCP - General   HPI  [name] is a 82 y.o. male Seen in followup for a Myoview undertaken because of chest discomfort associated with paroxysms of atrial fibrillation. He has been on 1C antiarrhythmic therapy With flecainide.  Myoview scanning demonstrated no ischemia and normal left ventricular function  He continures to have paroxyxms of atrial fibrillation that are quite disconcerting and at his last visit we increased his flecainide from 50>>100 bid   He has felt much better CHADS-VASc score is 3.  He is currently taking apixaban. He is tolerating it well.   The patient denies chest pain, shortness of breath, nocturnal dyspnea, orthopnea or peripheral edema.  There have been no palpitations, lightheadedness or syncope.      date PR interval QRS duration Dose  1/11  122 0  2/11  124 50  1/15  137 75  5/15 196 132 100  11/15  136 100  1/17 220 142 100     Past Medical History      Past Medical History  Diagnosis Date  . Hyperlipemia   . Hypertension   . Osteoarthritis   . BPH (benign prostatic hyperplasia)   . Allergic rhinitis   . Atrial fibrillation or flutter 12/10    Has been paroxysmal. Patient has not tolerated coumadin due to headaches. ECHO (12/10) Mild LVH, EF 55%,   . Sick sinus syndrome (HCC)     S/P dual chamber Medtronic PCM  . Chest pain 12/10    Lexiscan myoview with EF 55%, no ischemia or infarction  . Pacemaker  medtronic 02/06/2010    Qualifier: Diagnosis of  By: Lovena Le, MD, Martyn Malay   . Acute diverticulitis 12/15    [place]  . Acute diverticulitis     Past Surgical History  Procedure Laterality Date  . Vasectomy  in his 58's  . Dual chamber pacemaker  01/11    Dr. Lovena Le  . Laparoscopic cholecystectomy  1/14    Dr Bary Castilla    Current Outpatient Prescriptions  Medication Sig Dispense Refill  . acetaminophen (TYLENOL) 325 MG tablet Take 650 mg by mouth every 6 (six) hours  as needed.    . ALPRAZolam (XANAX) 0.25 MG tablet Take 1 tablet (0.25 mg total) by mouth 2 (two) times daily as needed for anxiety. 60 tablet 0  . diltiazem (TIAZAC) 360 MG 24 hr capsule TAKE ONE CAPSULE BY MOUTH EVERY DAY 30 capsule 3  . ELIQUIS 5 MG TABS tablet TAKE ONE TABLET BY MOUTH TWICE DAILY 60 tablet 1  . flecainide (TAMBOCOR) 100 MG tablet Take 1 tablet (100 mg total) by mouth 2 (two) times daily. 180 tablet 3  . Glucosamine 500 MG CAPS Take by mouth daily.      . ranitidine (ZANTAC) 150 MG tablet Take 150 mg by mouth as needed. Before main meal    . tamsulosin (FLOMAX) 0.4 MG CAPS capsule TAKE ONE CAPSULE BY MOUTH EVERY DAY 30 capsule 11   No current facility-administered medications for this visit.    No Known Allergies  Review of Systems negative except from HPI and PMH  Physical Exam BP 150/83 mmHg  Pulse 63  Ht 5' 10.5" (1.791 m)  Wt 178 lb 4 oz (80.854 kg)  BMI 25.21 kg/m2 Well developed and nourished in no acute distress HENT normal Neck supple with JVP-flat Carotids brisk and full without bruits Clear Device pocket well healed; without hematoma or  erythema.  There is no tethering   Regular rate and rhythm2/6 murmurs  Abd-soft with active BS without hepatomegaly No Clubbing cyanosis edema Skin-warm and dry A & Oriented  Grossly normal sensory and motor function  ECG  NSR 63 22/14/44  Assessment and  Plan  AFib  Doing better on higher flecainide,  No significant change in QRS    Sinus node dysfunction 98% pacing     Hypertension    Pacer Medtronic The patient's device was interrogated.  The information was reviewed. No changes were made in the programming.      We will begin him on low-dose lisinopril. I reviewed side effects. We will check a metabolic profile in 2 weeks. I should note that his creatinine 7/16 was 1.0 which is released to [facility] now that he is old is appropriate to continue him on 5 mg twice daily. He is tolerating liquids without  significant bleeding issues.  He is having infrequent atrial fibrillation and is asymptomatic.  His flecainide and he continues to have an effect on his conduction system. However, none of the changes are more than 25% so we will continue his current dose.

## 2015-10-27 NOTE — Patient Instructions (Signed)
Medication Instructions: 1) Start lisinopril 2.5 mg one tablet by mouth once daily  Labwork: - Your physician recommends that you return for lab work in: 2 weeks- BMP  Procedures/Testing: - none  Follow-Up: - Your physician wants you to follow-up in: 6 months with the [place] year with Dr. Caryl Comes. You will receive a reminder letter in the mail two months in advance. If you don't receive a letter, please call our office to schedule the follow-up appointment.  Any Additional Special Instructions Will Be Listed Below (If Applicable).     If you need a refill on your cardiac medications before your next appointment, please call your pharmacy.

## 2015-11-01 LAB — CUP PACEART INCLINIC DEVICE CHECK
Implantable Lead Implant Date: 20110127
Implantable Lead Location: 753860
Implantable Lead Model: 5076
Implantable Lead Model: 5076
MDC IDC LEAD IMPLANT DT: 20110127
MDC IDC LEAD LOCATION: 753859
MDC IDC SESS DTM: 20170124104805

## 2015-11-10 ENCOUNTER — Other Ambulatory Visit: Payer: Medicare Other

## 2015-11-11 ENCOUNTER — Other Ambulatory Visit: Payer: Medicare Other

## 2015-11-11 DIAGNOSIS — I4891 Unspecified atrial fibrillation: Secondary | ICD-10-CM

## 2015-11-12 LAB — BASIC METABOLIC PANEL
BUN/Creatinine Ratio: 14 (ref 10–22)
BUN: 17 mg/dL (ref 8–27)
CHLORIDE: 103 mmol/L (ref 96–106)
CO2: 21 mmol/L (ref 18–29)
Calcium: 9.1 mg/dL (ref 8.6–10.2)
Creatinine, Ser: 1.25 mg/dL (ref 0.76–1.27)
GFR calc Af Amer: 62 mL/min/{1.73_m2} (ref >59)
GFR calc non Af Amer: 54 mL/min/{1.73_m2} — ABNORMAL LOW (ref >59)
GLUCOSE: 76 mg/dL (ref 65–99)
POTASSIUM: 4.6 mmol/L (ref 3.5–5.2)
SODIUM: 141 mmol/L (ref 134–144)

## 2015-11-14 ENCOUNTER — Encounter: Payer: Self-pay | Admitting: Internal Medicine

## 2015-11-14 ENCOUNTER — Telehealth: Payer: Self-pay

## 2015-11-14 NOTE — Telephone Encounter (Signed)
Pt states he feel as if he doesn't feel as he had any energy. States his BP 90/50. HR is 60.Thinks it may be the medication DR. Caryl Comes gave him. Please call

## 2015-11-15 NOTE — Telephone Encounter (Signed)
S/w pt who states he is "fine now" At Jan 19 OV, lisinopril 2.5mg  added. He admits he did not start taking new medication until 4 days ago. Yesterday, he reports being tired and BP noted to be 90/50, HR 60.  He has stopped taking lisinopril and reports feeling better and back to normal. Report SBP 140 today and states he prefers his BP to stay in the 130-140 range. Informed pt that I will notify Dr. Caryl Comes and CB w/recommendations. Pt agreeable w/plan.

## 2015-11-15 NOTE — Telephone Encounter (Signed)
Left message on machine for patient to contact the office.   

## 2015-11-17 NOTE — Telephone Encounter (Signed)
Sounds like he is better off of it  Thanks  steve

## 2015-11-17 NOTE — Telephone Encounter (Signed)
I left a message for the patient to call. 

## 2015-11-18 ENCOUNTER — Other Ambulatory Visit: Payer: Self-pay | Admitting: Internal Medicine

## 2015-11-22 ENCOUNTER — Encounter: Payer: Medicare Other | Admitting: Internal Medicine

## 2015-11-24 ENCOUNTER — Other Ambulatory Visit: Payer: Self-pay | Admitting: Internal Medicine

## 2015-11-24 NOTE — Telephone Encounter (Signed)
Approved: #60 x 0 Due for Medicare wellness after July 22---he should set up soon so he gets appt

## 2015-11-24 NOTE — Telephone Encounter (Signed)
03/22/15 

## 2015-11-24 NOTE — Telephone Encounter (Signed)
rx called into pharmacy

## 2016-01-08 DIAGNOSIS — R339 Retention of urine, unspecified: Secondary | ICD-10-CM | POA: Diagnosis not present

## 2016-01-09 ENCOUNTER — Telehealth: Payer: Self-pay

## 2016-01-09 NOTE — Telephone Encounter (Signed)
[place] v[place] and was advised to see urologist and wanted to know if needed referral. I called pt back and he has already scheduled appt with [facility] urology for 01/17/16. Nothing further needed.

## 2016-01-12 ENCOUNTER — Telehealth: Payer: Self-pay

## 2016-01-12 NOTE — Telephone Encounter (Signed)
Pt called stating he was having trouble with the catheter and wanted to take it out. Pt stated that he has an upcoming appt on Tuesday. Made pt aware since he has not been seen in our office before unfortunately [facility] is not able to give him any advise. Reinforced with pt to follow physician orders where catheter was placed. Pt voiced understanding.

## 2016-01-14 ENCOUNTER — Emergency Department
Admission: EM | Admit: 2016-01-14 | Discharge: 2016-01-14 | Disposition: A | Discharge status: Home / Self Care | Payer: Medicare Other | Attending: Emergency Medicine | Admitting: Emergency Medicine

## 2016-01-14 ENCOUNTER — Encounter: Payer: Self-pay | Admitting: Emergency Medicine

## 2016-01-14 DIAGNOSIS — Y828 Other medical devices associated with adverse incidents: Secondary | ICD-10-CM | POA: Diagnosis not present

## 2016-01-14 DIAGNOSIS — Z87891 Personal history of nicotine dependence: Secondary | ICD-10-CM | POA: Insufficient documentation

## 2016-01-14 DIAGNOSIS — N390 Urinary tract infection, site not specified: Secondary | ICD-10-CM | POA: Diagnosis not present

## 2016-01-14 DIAGNOSIS — E785 Hyperlipidemia, unspecified: Secondary | ICD-10-CM | POA: Insufficient documentation

## 2016-01-14 DIAGNOSIS — N401 Enlarged prostate with lower urinary tract symptoms: Secondary | ICD-10-CM | POA: Insufficient documentation

## 2016-01-14 DIAGNOSIS — Y846 Urinary catheterization as the cause of abnormal reaction of the patient, or of later complication, without mention of misadventure at the time of the procedure: Secondary | ICD-10-CM | POA: Diagnosis not present

## 2016-01-14 DIAGNOSIS — I10 Essential (primary) hypertension: Secondary | ICD-10-CM | POA: Insufficient documentation

## 2016-01-14 DIAGNOSIS — Z950 Presence of cardiac pacemaker: Secondary | ICD-10-CM | POA: Insufficient documentation

## 2016-01-14 DIAGNOSIS — T83511A Infection and inflammatory reaction due to indwelling urethral catheter, initial encounter: Secondary | ICD-10-CM

## 2016-01-14 DIAGNOSIS — T83031A Leakage of indwelling urethral catheter, initial encounter: Secondary | ICD-10-CM | POA: Diagnosis present

## 2016-01-14 LAB — URINALYSIS COMPLETE WITH MICROSCOPIC (ARMC ONLY)
Bilirubin Urine: NEGATIVE
Glucose, UA: NEGATIVE mg/dL
Ketones, ur: NEGATIVE mg/dL
Nitrite: NEGATIVE
PH: 8 (ref 5.0–8.0)
SPECIFIC GRAVITY, URINE: 1.028 (ref 1.005–1.030)
SQUAMOUS EPITHELIAL / LPF: NONE SEEN

## 2016-01-14 MED ORDER — LEVOFLOXACIN 250 MG PO TABS
250.0000 mg | ORAL_TABLET | Freq: Every day | ORAL | Status: DC
  Administered 2016-01-14: 250 mg via ORAL
  Filled 2016-01-14: qty 1

## 2016-01-14 MED ORDER — LEVOFLOXACIN 250 MG PO TABS: 250.0000 mg | ORAL_TABLET | Freq: Every day | ORAL | Status: DC

## 2016-01-14 NOTE — ED Notes (Signed)
NAD noted at time of D/C. Pt denies questions or concerns. Pt ambulatory to the lobby at this time.  

## 2016-01-14 NOTE — ED Provider Notes (Signed)
CSN: [id number]     Arrival date & time 01/14/16  1057 History   First MD Initiated Contact with Patient 01/14/16 1131     Chief Complaint  Patient presents with  . catheter problems      (Consider location/radiation/quality/duration/timing/severity/associated sxs/prior Treatment) HPI  82 year old male presents to emergency department for evaluation of his Foley catheter. Patient was seen one week ago in [facility], diagnosed with urinary retention and Foley catheter was placed. He was advised to follow-up 4 days after to have Foley removed, states he was unable to get urology appointment until 12 days after. Patient has appointment scheduled in 3 days. Today, patient states he developed hurting along the catheter insertion of the penis with leaking. He is noticed change in color of his urine. Has mild abdominal pain. Foley continues to drain and patient is making urine. He denies any fevers, nausea, vomiting, back pain. No chest pain or shortness of breath. Overall patient feels well.  Past Medical History  Diagnosis Date  . Hyperlipemia   . Hypertension   . Osteoarthritis   . BPH (benign prostatic hyperplasia)   . Allergic rhinitis   . Atrial fibrillation or flutter 12/10    Has been paroxysmal. Patient has not tolerated coumadin due to headaches. ECHO (12/10) Mild LVH, EF 55%,   . Sick sinus syndrome (HCC)     S/P dual chamber Medtronic PCM  . Chest pain 12/10    Lexiscan myoview with EF 55%, no ischemia or infarction  . Pacemaker  medtronic 02/06/2010    Qualifier: Diagnosis of  By: Lovena Le, MD, Martyn Malay   . Acute diverticulitis 12/15    [place]  . Acute diverticulitis    Past Surgical History  Procedure Laterality Date  . Vasectomy  in his 78's  . Dual chamber pacemaker  01/11    Dr. Lovena Le  . Laparoscopic cholecystectomy  1/14    Dr Bary Castilla   Family History  Problem Relation Age of Onset  . Hypertension Mother   . Alcohol abuse Father    Social History   Substance Use Topics  . Smoking status: Former Research scientist (life sciences)  . Smokeless tobacco: Never Used     Comment: In [employer], quit in 1960's  . Alcohol Use: No    Review of Systems  Constitutional: Negative.  Negative for fever, chills, activity change and appetite change.  HENT: Negative for congestion, ear pain, mouth sores, rhinorrhea, sinus pressure, sore throat and trouble swallowing.   Eyes: Negative for photophobia, pain and discharge.  Respiratory: Negative for cough, chest tightness and shortness of breath.   Cardiovascular: Negative for chest pain and leg swelling.  Gastrointestinal: Negative for nausea, vomiting, abdominal pain, diarrhea and abdominal distention.  Genitourinary: Positive for dysuria, urgency and difficulty urinating. Negative for decreased urine volume and discharge.  Musculoskeletal: Negative for back pain, arthralgias and gait problem.  Skin: Negative for color change and rash.  Neurological: Negative for dizziness and headaches.  Hematological: Negative for adenopathy.  Psychiatric/Behavioral: Negative for behavioral problems and agitation.      Allergies  Review of patient's allergies indicates no known allergies.  Home Medications   Prior to Admission medications   Medication Sig Start Date End Date Taking? Authorizing Provider  acetaminophen (TYLENOL) 325 MG tablet Take 650 mg by mouth every 6 (six) hours as needed.    Historical Provider, MD  ALPRAZolam Duanne Moron) 0.25 MG tablet TAKE 1 TABLET BY MOUTH TWICE DAILY AS NEEDED FOR ANXIETY 11/24/15   [name],  MD  diltiazem (TIAZAC) 360 MG 24 hr capsule TAKE ONE CAPSULE BY MOUTH EVERY DAY 04/28/15   Deboraha Sprang, MD  ELIQUIS 5 MG TABS tablet TAKE 1 TABLET BY MOUTH TWICE DAILY 11/18/15   Minna Merritts, MD  flecainide (TAMBOCOR) 100 MG tablet Take 1 tablet (100 mg total) by mouth 2 (two) times daily. 12/13/14   Deboraha Sprang, MD  Glucosamine 500 MG CAPS Take by mouth daily.      Historical Provider, MD   levofloxacin (LEVAQUIN) 250 MG tablet Take 1 tablet (250 mg total) by mouth daily. X 10 days 01/14/16 01/24/16  Duanne Guess, PA-C  lisinopril (PRINIVIL,ZESTRIL) 2.5 MG tablet Take 1 tablet (2.5 mg total) by mouth daily. 10/27/15   Deboraha Sprang, MD  ranitidine (ZANTAC) 150 MG tablet Take 150 mg by mouth as needed. Before main meal    Historical Provider, MD  tamsulosin (FLOMAX) 0.4 MG CAPS capsule TAKE ONE CAPSULE BY MOUTH EVERY DAY 08/16/14   [name], MD   BP 138/77 mmHg  Pulse 74  Temp(Src) 98.7 F (37.1 C) (Oral)  Resp 18  Ht 5\' 11"  (1.803 m)  Wt 79.379 kg  BMI 24.42 kg/m2  SpO2 100% Physical Exam  Constitutional: He is oriented to person, place, and time. He appears well-developed and well-nourished. No distress.  HENT:  Head: Normocephalic and atraumatic.  Right Ear: External ear normal.  Left Ear: External ear normal.  Eyes: Conjunctivae and EOM are normal. Pupils are equal, round, and reactive to light.  Neck: Normal range of motion. Neck supple.  Cardiovascular: Normal rate, regular rhythm, normal heart sounds and intact distal pulses.   Pulmonary/Chest: Effort normal and breath sounds normal. No respiratory distress. He has no wheezes. He has no rales. He exhibits no tenderness.  Abdominal: Soft. Bowel sounds are normal. He exhibits no distension and no mass. There is no tenderness. There is no rebound and no guarding.  Genitourinary: Penis normal. No penile tenderness.  No scrotal swelling or tenderness to palpation. Patient is uncircumcised. Foley catheter is in place with no discharge.  Musculoskeletal: Normal range of motion. He exhibits no edema or tenderness.  Neurological: He is alert and oriented to person, place, and time.  Skin: Skin is warm and dry. No erythema.  Psychiatric: He has a normal mood and affect. His behavior is normal. Judgment and thought content normal.    ED Course  Procedures (including critical care time) Labs Review Labs Reviewed   URINALYSIS COMPLETEWITH MICROSCOPIC ([place]) - Abnormal; Notable for the following:    Color, Urine RED (*)    APPearance TURBID (*)    Hgb urine dipstick 2+ (*)    Protein, ur >500 (*)    Leukocytes, UA 2+ (*)    Bacteria, UA MANY (*)    All other components within normal limits  URINE CULTURE    Imaging Review No results found. I have personally reviewed and evaluated these images and lab results as part of my medical decision-making.   EKG Interpretation None      MDM   Final diagnoses:  Urinary tract infection associated with catheterization of urinary tract, initial encounter    81 year old male with urinary retention one week ago in [place], Foley catheter placed. Over the last 24 hours developed burning and discomfort around the tip of the penis. Patient denies any history of prostate issues, states they had no complications with getting the Foley catheter in place. Bladder scan showed 64  ML's. Foley catheter was removed, patient was able to urinate initially with red tinge, after second urination, no blood was present. Patient was able to urinate a third time, this was sent for culture. Patient was tolerating by mouth fluids well. No difficulty urinating, normal flow, no pain. We'll treat for complicated UTI due to indwelling catheter. Levaquin 250 mg daily for 10 days. Vital signs stable afebrile. Follow-up with urologist in 3 days.    ABBIE RUSCHAK, PA-C 01/14/16 [place], MD 01/14/16 [phone]

## 2016-01-14 NOTE — ED Notes (Signed)
Bladder scan noted to be 45mL.

## 2016-01-14 NOTE — Discharge Instructions (Signed)
Catheter-Associated Urinary Tract Infection FAQs  What is "catheter-associated urinary tract infection"?  A urinary tract infection (also called "UTI") is an infection in the urinary system, which includes the bladder (which stores the urine) and the kidneys (which filter the blood to make urine). Germs (for example, bacteria or yeasts) do not normally live in these areas; but if germs are introduced, an infection can occur.  If you have a urinary catheter, germs can travel along the catheter and cause an infection in your bladder or your kidney; in that case it is called a catheter-associated urinary tract infection (or "CA-UTI").   What is a urinary catheter?  A urinary catheter is a thin tube placed in the bladder to drain urine. Urine drains through the tube into a bag that collects the urine. A urinary catheter may be used:  · If you are not able to urinate on your own  · To measure the amount of urine that you make, for example, during intensive care  · During and after some types of surgery  · During some tests of the kidneys and bladder  People with urinary catheters have a much higher chance of getting a urinary tract infection than people who don't have a catheter.  How do I get a catheter-associated urinary tract infection (CA-UTI)?  If germs enter the urinary tract, they may cause an infection. Many of the germs that cause a catheter-associated urinary tract infection are common germs found in your intestines that do not usually cause an infection there. Germs can enter the urinary tract when the catheter is being put in or while the catheter remains in the bladder.   What are the symptoms of a urinary tract infection?  Some of the common symptoms of a urinary tract infection are:  · Burning or pain in the lower abdomen (that is, below the stomach)  · Fever  · Bloody urine may be a sign of infection, but is also caused by other problems  · Burning during urination or an increase in the frequency of  urination after the catheter is removed.  Sometimes people with catheter-associated urinary tract infections do not have these symptoms of infection.  Can catheter-associated urinary tract infections be treated?  Yes, most catheter-associated urinary tract infections can be treated with antibiotics and removal or change of the catheter. Your doctor will determine which antibiotic is best for you.   What are some of the things that hospitals are doing to prevent catheter-associated urinary tract infections?  To prevent urinary tract infections, doctors and nurses take the following actions.   Catheter insertion  · External catheters in men (these look like condoms and are placed over the penis rather than into the penis)  · Putting a temporary catheter in to drain the urine and removing it right away. This is called intermittent urethral catheterization.  Catheter care  What can I do to help prevent catheter-associated urinary tract infections if I have a catheter?  · Always clean your hands before and after doing catheter care.  · Always keep your urine bag below the level of your bladder.  · Do not tug or pull on the tubing.  · Do not twist or kink the catheter tubing.  · Ask your healthcare provider each day if you still need the catheter.  What do I need to do when I go home from the hospital?  · If you will be going home with a catheter, your doctor or nurse should explain everything   you need to know about taking care of the catheter. Make sure you understand how to care for it before you leave the hospital.  · If you develop any of the symptoms of a urinary tract infection, such as burning or pain in the lower abdomen, fever, or an increase in the frequency of urination, contact your doctor or nurse immediately.  · Before you go home, make sure you know who to contact if you have questions or problems after you get home.  If you have questions, please ask your doctor or nurse.  Developed and co-sponsored by The  Society for Healthcare Epidemiology of America (SHEA); Infectious Diseases Society of America (IDSA); American Hospital Association; Association for Professionals in Infection Control and Epidemiology (APIC); Centers for Disease Control and Prevention (CDC); and The Joint Commission.     This information is not intended to replace advice given to you by your health care provider. Make sure you discuss any questions you have with your health care provider.     Document Released: 06/18/2012 Document Revised: 02/08/2015 Document Reviewed: 12/08/2014  Elsevier Interactive Patient Education ©2016 [employer]

## 2016-01-14 NOTE — ED Notes (Signed)
Pt to ed with c/o urinary catheter problems.  Pt reports urine is leaking around the catheter and he is in severe pain.  Pt states it was placed Saturday night 1 week ago at a hospital in [place].  Pt states he was told to follow up with urologist but can't get an appt until Tuesday and is having pain, burning and leaking.

## 2016-01-14 NOTE — ED Notes (Signed)
Foley catheter discontinued.

## 2016-01-16 LAB — URINE CULTURE

## 2016-01-17 ENCOUNTER — Ambulatory Visit (INDEPENDENT_AMBULATORY_CARE_PROVIDER_SITE_OTHER): Payer: Medicare Other | Admitting: Urology

## 2016-01-17 ENCOUNTER — Encounter: Payer: Self-pay | Admitting: Urology

## 2016-01-17 VITALS — BP 146/69 | HR 71 | Ht 71.0 in | Wt 175.0 lb

## 2016-01-17 DIAGNOSIS — R339 Retention of urine, unspecified: Secondary | ICD-10-CM

## 2016-01-17 LAB — BLADDER SCAN AMB NON-IMAGING

## 2016-01-17 LAB — MICROSCOPIC EXAMINATION

## 2016-01-17 LAB — URINALYSIS, COMPLETE
BILIRUBIN UA: NEGATIVE
Glucose, UA: NEGATIVE
KETONES UA: NEGATIVE
NITRITE UA: NEGATIVE
PH UA: 5 (ref 5.0–7.5)
RBC UA: NEGATIVE
SPEC GRAV UA: 1.025 (ref 1.005–1.030)
UUROB: 0.2 mg/dL (ref 0.2–1.0)

## 2016-01-17 MED ORDER — FINASTERIDE 5 MG PO TABS: 5.0000 mg | ORAL_TABLET | Freq: Every day | ORAL | Status: DC

## 2016-01-17 NOTE — Progress Notes (Signed)
01/17/2016 10:50 AM   [name] 1933-10-02 [id number]  Referring provider: [name], MD [address] [place], [place] [address]  Chief Complaint  Patient presents with  . Urinary Retention    New Patient    HPI: [name] is a 82yo seen in followup for BPH with LUTS and urinary retention. He has nocturia 2-3x. He had issues with urinary retention 10 days ago and had a foley placed then he presented and then he presented to teh ER with dysuria and was diagnosed with a UTI and had the foley removed. PVR 67cc.     PMH: Past Medical History  Diagnosis Date  . Hyperlipemia   . Hypertension   . Osteoarthritis   . BPH (benign prostatic hyperplasia)   . Allergic rhinitis   . Atrial fibrillation or flutter 12/10    Has been paroxysmal. Patient has not tolerated coumadin due to headaches. ECHO (12/10) Mild LVH, EF 55%,   . Sick sinus syndrome (HCC)     S/P dual chamber Medtronic PCM  . Chest pain 12/10    Lexiscan myoview with EF 55%, no ischemia or infarction  . Pacemaker  medtronic 02/06/2010    Qualifier: Diagnosis of  By: Lovena Le, MD, Martyn Malay   . Acute diverticulitis 12/15    [place]  . Acute diverticulitis     Surgical History: Past Surgical History  Procedure Laterality Date  . Vasectomy  in his 45's  . Dual chamber pacemaker  01/11    Dr. Lovena Le  . Laparoscopic cholecystectomy  1/14    Dr Bary Castilla    Home Medications:    Medication List       This list is accurate as of: 01/17/16 10:50 AM.  Always use your most recent med list.               acetaminophen 325 MG tablet  Commonly known as:  TYLENOL  Take 650 mg by mouth every 6 (six) hours as needed. Reported on 01/17/2016     ALPRAZolam 0.25 MG tablet  Commonly known as:  XANAX  TAKE 1 TABLET BY MOUTH TWICE DAILY AS NEEDED FOR ANXIETY     amoxicillin-clavulanate 500-125 MG tablet  Commonly known as:  AUGMENTIN  TK 1 T PO BID FOR 7 DAYS     diltiazem 360 MG 24 hr capsule    Commonly known as:  TIAZAC  TAKE ONE CAPSULE BY MOUTH EVERY DAY     ELIQUIS 5 MG Tabs tablet  Generic drug:  apixaban  TAKE 1 TABLET BY MOUTH TWICE DAILY     flecainide 100 MG tablet  Commonly known as:  TAMBOCOR  Take 1 tablet (100 mg total) by mouth 2 (two) times daily.     Glucosamine 500 MG Caps  Take by mouth daily.     lisinopril 2.5 MG tablet  Commonly known as:  PRINIVIL,ZESTRIL  Take 1 tablet (2.5 mg total) by mouth daily.     ranitidine 150 MG tablet  Commonly known as:  ZANTAC  Take 150 mg by mouth as needed. Before main meal     tamsulosin 0.4 MG Caps capsule  Commonly known as:  FLOMAX  TAKE ONE CAPSULE BY MOUTH EVERY DAY        Allergies: No Known Allergies  Family History: Family History  Problem Relation Age of Onset  . Hypertension Mother   . Alcohol abuse Father   . Bladder Cancer Neg Hx   . Prostate cancer  Neg Hx   . Kidney cancer Neg Hx     Social History:  reports that he has quit smoking. He has never used smokeless tobacco. He reports that he does not drink alcohol or use illicit drugs.  ROS: UROLOGY Frequent Urination?: Yes Hard to postpone urination?: No Burning/pain with urination?: Yes Get up at night to urinate?: Yes Leakage of urine?: No Urine stream starts and stops?: Yes Trouble starting stream?: Yes Do you have to strain to urinate?: Yes Blood in urine?: No Urinary tract infection?: No Sexually transmitted disease?: No Injury to kidneys or bladder?: No Painful intercourse?: No Weak stream?: Yes Erection problems?: No Penile pain?: No  Gastrointestinal Nausea?: No Vomiting?: No Indigestion/heartburn?: Yes Diarrhea?: No Constipation?: Yes  Constitutional Fever: No Night sweats?: No Weight loss?: No Fatigue?: No  Skin Skin rash/lesions?: No Itching?: No  Eyes Blurred vision?: No Double vision?: No  Ears/Nose/Throat Sore throat?: No Sinus problems?: Yes  Hematologic/Lymphatic Swollen glands?:  No Easy bruising?: Yes  Cardiovascular Leg swelling?: No Chest pain?: No  Respiratory Cough?: No Shortness of breath?: No  Endocrine Excessive thirst?: No  Musculoskeletal Back pain?: No Joint pain?: No  Neurological Headaches?: No Dizziness?: No  Psychologic Depression?: No Anxiety?: No  Physical Exam: BP 146/69 mmHg  Pulse 71  Ht 5\' 11"  (1.803 m)  Wt 79.379 kg (175 lb)  BMI 24.42 kg/m2  Constitutional:  Alert and oriented, No acute distress. HEENT:  AT, moist mucus membranes.  Trachea midline, no masses. Cardiovascular: No clubbing, cyanosis, or edema. Respiratory: Normal respiratory effort, no increased work of breathing. GI: Abdomen is soft, nontender, nondistended, no abdominal masses GU: No CVA tenderness. 100g prostate right lobe assymetry Skin: No rashes, bruises or suspicious lesions. Lymph: No cervical or inguinal adenopathy. Neurologic: Grossly intact, no focal deficits, moving all 4 extremities. Psychiatric: Normal mood and affect.  Laboratory Data: Lab Results  Component Value Date   WBC 5.2 04/29/2015   HGB 14.1 04/29/2015   HCT 41.8 04/29/2015   MCV 93.7 04/29/2015   PLT 276.0 04/29/2015    Lab Results  Component Value Date   CREATININE 1.25 11/11/2015    No results found for: PSA  No results found for: TESTOSTERONE  No results found for: HGBA1C  Urinalysis    Component Value Date/Time   COLORURINE RED* 01/14/2016 1147   COLORURINE Yellow 10/05/2014 1418   APPEARANCEUR TURBID* 01/14/2016 1147   APPEARANCEUR Clear 10/05/2014 1418   LABSPEC 1.028 01/14/2016 1147   LABSPEC 1.024 10/05/2014 1418   PHURINE 8.0 01/14/2016 1147   PHURINE 5.0 10/05/2014 1418   GLUCOSEU NEGATIVE 01/14/2016 1147   GLUCOSEU Negative 10/05/2014 1418   HGBUR 2+* 01/14/2016 1147   HGBUR Negative 10/05/2014 1418   BILIRUBINUR NEGATIVE 01/14/2016 1147   BILIRUBINUR Negative 10/05/2014 1418   BILIRUBINUR neg 04/30/2012 1232   KETONESUR NEGATIVE  01/14/2016 1147   KETONESUR Negative 10/05/2014 1418   PROTEINUR >500* 01/14/2016 1147   PROTEINUR Negative 10/05/2014 1418   PROTEINUR 30 04/30/2012 1232   UROBILINOGEN 0.2 04/30/2012 1232   NITRITE NEGATIVE 01/14/2016 1147   NITRITE Negative 10/05/2014 1418   NITRITE neg 04/30/2012 1232   LEUKOCYTESUR 2+* 01/14/2016 1147   LEUKOCYTESUR Negative 10/05/2014 1418    Pertinent Imaging:   Assessment & Plan:    1. Urinary retention -continue flomax.  -finasteride 5mg   - Urinalysis, Complete - BLADDER SCAN AMB NON-IMAGING   No Follow-up on file.  Nicolette Bang, MD  [facility] [address] [place],  [place] [address] [phone]

## 2016-01-30 ENCOUNTER — Ambulatory Visit (INDEPENDENT_AMBULATORY_CARE_PROVIDER_SITE_OTHER): Payer: Medicare Other | Admitting: Internal Medicine

## 2016-01-30 ENCOUNTER — Encounter: Payer: Self-pay | Admitting: Internal Medicine

## 2016-01-30 VITALS — BP 138/78 | HR 64 | Temp 98.0°F | Wt 175.0 lb

## 2016-01-30 DIAGNOSIS — R300 Dysuria: Secondary | ICD-10-CM | POA: Diagnosis not present

## 2016-01-30 LAB — POC URINALSYSI DIPSTICK (AUTOMATED)
BILIRUBIN UA: NEGATIVE
Blood, UA: NEGATIVE
Glucose, UA: NEGATIVE
KETONES UA: NEGATIVE
LEUKOCYTES UA: NEGATIVE
NITRITE UA: NEGATIVE
PH UA: 6
PROTEIN UA: NEGATIVE
Spec Grav, UA: 1.025
UROBILINOGEN UA: 4

## 2016-01-30 MED ORDER — CEPHALEXIN 500 MG PO CAPS: 500.0000 mg | ORAL_CAPSULE | Freq: Three times a day (TID) | ORAL | Status: DC

## 2016-01-30 NOTE — Assessment & Plan Note (Signed)
Having classic symptoms of infection but urinalysis benign Will try cephalexin now=== bacteria was sensitive to all except macrobid ?bladder spasm or retention again?? If symptoms persist, will need to go back to Dr Alyson Ingles

## 2016-01-30 NOTE — Patient Instructions (Signed)
If your symptoms don't improve, you will need to go back to the urologist.

## 2016-01-30 NOTE — Progress Notes (Signed)
Pre visit review using our clinic review tool, if applicable. No additional management support is needed unless otherwise documented below in the visit note. 

## 2016-01-30 NOTE — Progress Notes (Signed)
Subjective:    Patient ID: [name], male    DOB: May 02, 1933, 82 y.o.   MRN: [id number]  HPI Here due to ongoing urinary symptoms  [place] beach--and couldn't pee Went to ER and catheter put in Noted problems with catheter---leaking and burning--so went to ER Infection noted and catheter removed Given antibiotic--- only took 4 days and not 7  Having bad burning dysuria again No blood Feels that he is not emptying his bladder Has to go frequently---affecting his sleep Had been on tamsulosin Finasteride started at urology visit --- but only about a week ago  He did try taking the left over antibiotic This hasn't helped at all  Current Outpatient Prescriptions on File Prior to Visit  Medication Sig Dispense Refill  . acetaminophen (TYLENOL) 325 MG tablet Take 650 mg by mouth every 6 (six) hours as needed. Reported on 01/17/2016    . ALPRAZolam (XANAX) 0.25 MG tablet TAKE 1 TABLET BY MOUTH TWICE DAILY AS NEEDED FOR ANXIETY 60 tablet 0  . diltiazem (TIAZAC) 360 MG 24 hr capsule TAKE ONE CAPSULE BY MOUTH EVERY DAY 30 capsule 3  . ELIQUIS 5 MG TABS tablet TAKE 1 TABLET BY MOUTH TWICE DAILY 60 tablet 6  . finasteride (PROSCAR) 5 MG tablet Take 1 tablet (5 mg total) by mouth daily. 30 tablet 11  . flecainide (TAMBOCOR) 100 MG tablet Take 1 tablet (100 mg total) by mouth 2 (two) times daily. 180 tablet 3  . Glucosamine 500 MG CAPS Take by mouth daily.      [name] lisinopril (PRINIVIL,ZESTRIL) 2.5 MG tablet Take 1 tablet (2.5 mg total) by mouth daily. 30 tablet 11  . ranitidine (ZANTAC) 150 MG tablet Take 150 mg by mouth as needed. Before main meal    . tamsulosin (FLOMAX) 0.4 MG CAPS capsule TAKE ONE CAPSULE BY MOUTH EVERY DAY 30 capsule 11   No current facility-administered medications on file prior to visit.    No Known Allergies  Past Medical History  Diagnosis Date  . Hyperlipemia   . Hypertension   . Osteoarthritis   . BPH (benign prostatic hyperplasia)   .  Allergic rhinitis   . Atrial fibrillation or flutter 12/10    Has been paroxysmal. Patient has not tolerated coumadin due to headaches. ECHO (12/10) Mild LVH, EF 55%,   . Sick sinus syndrome (HCC)     S/P dual chamber Medtronic PCM  . Chest pain 12/10    Lexiscan myoview with EF 55%, no ischemia or infarction  . Pacemaker  medtronic 02/06/2010    Qualifier: Diagnosis of  By: Lovena Le, MD, Martyn Malay   . Acute diverticulitis 12/15    [place]  . Acute diverticulitis     Past Surgical History  Procedure Laterality Date  . Vasectomy  in his 34's  . Dual chamber pacemaker  01/11    Dr. Lovena Le  . Laparoscopic cholecystectomy  1/14    Dr Bary Castilla    Family History  Problem Relation Age of Onset  . Hypertension Mother   . Alcohol abuse Father   . Bladder Cancer Neg Hx   . Prostate cancer Neg Hx   . Kidney cancer Neg Hx     Social History   Social History  . Marital Status: Married    Spouse Name: N/A  . Number of Children: 3  . Years of Education: N/A   Occupational History  . Retired     Hydrologist   Social History  Main Topics  . Smoking status: Former Research scientist (life sciences)  . Smokeless tobacco: Never Used     Comment: In [employer], quit in 1960's  . Alcohol Use: No  . Drug Use: No  . Sexual Activity: Not on file   Other Topics Concern  . Not on file   Social History Narrative   2 Daughters, one son killed in [place]      Has living will   Wife, then daughter Maudie Mercury, is health care POA   Would accept resuscitation attempts but no prolonged life support.   No tube feeding if cognitively unaware   Review of Systems  No fever No N/V Appetite is fine     Objective:   Physical Exam  Constitutional: He appears well-developed and well-nourished. No distress.  Abdominal: Soft. He exhibits no distension. There is no tenderness. There is no rebound and no guarding.  Musculoskeletal:  No CVA tenderness          Assessment & Plan:

## 2016-01-30 NOTE — Addendum Note (Signed)
Addended by: Pilar Grammes on: 01/30/2016 03:15 PM   Modules accepted: Orders

## 2016-02-06 DIAGNOSIS — S0501XA Injury of conjunctiva and corneal abrasion without foreign body, right eye, initial encounter: Secondary | ICD-10-CM | POA: Diagnosis not present

## 2016-02-08 DIAGNOSIS — H2513 Age-related nuclear cataract, bilateral: Secondary | ICD-10-CM | POA: Diagnosis not present

## 2016-04-04 ENCOUNTER — Ambulatory Visit (INDEPENDENT_AMBULATORY_CARE_PROVIDER_SITE_OTHER): Payer: Medicare Other | Admitting: Primary Care

## 2016-04-04 VITALS — BP 140/72 | HR 62 | Temp 98.1°F | Ht 71.0 in | Wt 174.8 lb

## 2016-04-04 DIAGNOSIS — T148 Other injury of unspecified body region: Secondary | ICD-10-CM | POA: Diagnosis not present

## 2016-04-04 DIAGNOSIS — Z23 Encounter for immunization: Secondary | ICD-10-CM

## 2016-04-04 DIAGNOSIS — IMO0002 Reserved for concepts with insufficient information to code with codable children: Secondary | ICD-10-CM

## 2016-04-04 NOTE — Patient Instructions (Signed)
Keep your wound protected, clean, and dry to prevent infection.   You were provided with a tetanus vaccination today which will cover you for 10 years.   Please notify me if you develop drainage, bleeding, redness, increased pain to your hand.  It was a pleasure meeting you!

## 2016-04-04 NOTE — Addendum Note (Signed)
Addended by: Jacqualin Combes on: 04/04/2016 12:10 PM   Modules accepted: Orders

## 2016-04-04 NOTE — Progress Notes (Signed)
Subjective:    Patient ID: [name], male    DOB: 1933-05-07, 82 y.o.   MRN: [id number]  HPI  [name] is an 82 year old male who presents today with a chief complaint of laceration. He cut himself to his right palm yesterday, on a rusty classic car that he is restoring. His laceration bleed slightly after the cut which improved several hours later. He is managed on Eliquis for chronic atrial fibrillation. He cleaned out his wound yesterday with water and placed a bandage on the wound. His last tetanus vaccination was in 2008.   Denies fevers, numbness, erythema, drainage, bleeding.  Review of Systems  Constitutional: Negative for fever.  Skin: Positive for wound.  Neurological: Negative for numbness.       Past Medical History  Diagnosis Date  . Hyperlipemia   . Hypertension   . Osteoarthritis   . BPH (benign prostatic hyperplasia)   . Allergic rhinitis   . Atrial fibrillation or flutter 12/10    Has been paroxysmal. Patient has not tolerated coumadin due to headaches. ECHO (12/10) Mild LVH, EF 55%,   . Sick sinus syndrome (HCC)     S/P dual chamber Medtronic PCM  . Chest pain 12/10    Lexiscan myoview with EF 55%, no ischemia or infarction  . Pacemaker  medtronic 02/06/2010    Qualifier: Diagnosis of  By: Lovena Le, MD, Martyn Malay   . Acute diverticulitis 12/15    [place]  . Acute diverticulitis      Social History   Social History  . Marital Status: Married    Spouse Name: N/A  . Number of Children: 3  . Years of Education: N/A   Occupational History  . Retired     Hydrologist   Social History Main Topics  . Smoking status: Former Research scientist (life sciences)  . Smokeless tobacco: Never Used     Comment: In [employer], quit in 1960's  . Alcohol Use: No  . Drug Use: No  . Sexual Activity: Not on file   Other Topics Concern  . Not on file   Social History Narrative   2 Daughters, one son killed in [place]      Has living will   Wife, then daughter Maudie Mercury, is health care  POA   Would accept resuscitation attempts but no prolonged life support.   No tube feeding if cognitively unaware    Past Surgical History  Procedure Laterality Date  . Vasectomy  in his 73's  . Dual chamber pacemaker  01/11    Dr. Lovena Le  . Laparoscopic cholecystectomy  1/14    Dr Bary Castilla    Family History  Problem Relation Age of Onset  . Hypertension Mother   . Alcohol abuse Father   . Bladder Cancer Neg Hx   . Prostate cancer Neg Hx   . Kidney cancer Neg Hx     No Known Allergies  Current Outpatient Prescriptions on File Prior to Visit  Medication Sig Dispense Refill  . acetaminophen (TYLENOL) 325 MG tablet Take 650 mg by mouth every 6 (six) hours as needed. Reported on 01/17/2016    . ALPRAZolam (XANAX) 0.25 MG tablet TAKE 1 TABLET BY MOUTH TWICE DAILY AS NEEDED FOR ANXIETY 60 tablet 0  . diltiazem (TIAZAC) 360 MG 24 hr capsule TAKE ONE CAPSULE BY MOUTH EVERY DAY 30 capsule 3  . ELIQUIS 5 MG TABS tablet TAKE 1 TABLET BY MOUTH TWICE DAILY 60 tablet 6  . flecainide (TAMBOCOR) 100  MG tablet Take 1 tablet (100 mg total) by mouth 2 (two) times daily. 180 tablet 3  . Glucosamine 500 MG CAPS Take by mouth daily.      [name] lisinopril (PRINIVIL,ZESTRIL) 2.5 MG tablet Take 1 tablet (2.5 mg total) by mouth daily. 30 tablet 11  . tamsulosin (FLOMAX) 0.4 MG CAPS capsule TAKE ONE CAPSULE BY MOUTH EVERY DAY 30 capsule 11   No current facility-administered medications on file prior to visit.    BP 140/72 mmHg  Pulse 62  Temp(Src) 98.1 F (36.7 C) (Oral)  Ht 5\' 11"  (1.803 m)  Wt 174 lb 12.8 oz (79.289 kg)  BMI 24.39 kg/m2  SpO2 98%    Objective:   Physical Exam  Constitutional: He appears well-nourished.  Cardiovascular: Normal rate.   Pulmonary/Chest: Effort normal.  Skin:  1 cm superficial laceration to right palmer surface of hand. No s/s of infection. No active bleeding.          Assessment & Plan:  Laceration:  Superficial, 1 cm, located to right palmar surface.  Does not require suturing.  Cut on old classic car while restoring yesterday. Tetanus last obtained in 2008, since over 5 years ago provided again today. No s/s of acute infection or active bleeding. Continue Eliquis. Cleansed wound in clinic today and bandage applied. Discussed home care instructions and return precautions for s/s of infection.  Sheral Flow, NP

## 2016-04-04 NOTE — Progress Notes (Signed)
Pre visit review using our clinic review tool, if applicable. No additional management support is needed unless otherwise documented below in the visit note. 

## 2016-04-17 ENCOUNTER — Ambulatory Visit: Payer: Medicare Other

## 2016-05-01 ENCOUNTER — Ambulatory Visit (INDEPENDENT_AMBULATORY_CARE_PROVIDER_SITE_OTHER): Payer: Medicare Other | Admitting: *Deleted

## 2016-05-01 ENCOUNTER — Encounter: Payer: Self-pay | Admitting: Internal Medicine

## 2016-05-01 DIAGNOSIS — I495 Sick sinus syndrome: Secondary | ICD-10-CM

## 2016-05-01 DIAGNOSIS — Z950 Presence of cardiac pacemaker: Secondary | ICD-10-CM | POA: Diagnosis not present

## 2016-05-01 LAB — CUP PACEART INCLINIC DEVICE CHECK
Battery Impedance: 447 Ohm
Brady Statistic AP VP Percent: 0 %
Brady Statistic AS VP Percent: 0 %
Brady Statistic AS VS Percent: 2 %
Implantable Lead Implant Date: 20110127
Implantable Lead Location: 753859
Implantable Lead Model: 5076
Lead Channel Impedance Value: 507 Ohm
Lead Channel Pacing Threshold Amplitude: 1 V
Lead Channel Pacing Threshold Amplitude: 1 V
Lead Channel Pacing Threshold Pulse Width: 0.4 ms
Lead Channel Sensing Intrinsic Amplitude: 2 mV
Lead Channel Sensing Intrinsic Amplitude: 4 mV
Lead Channel Setting Sensing Sensitivity: 2 mV
MDC IDC LEAD IMPLANT DT: 20110127
MDC IDC LEAD LOCATION: 753860
MDC IDC MSMT BATTERY REMAINING LONGEVITY: 100 mo
MDC IDC MSMT BATTERY VOLTAGE: 2.79 V
MDC IDC MSMT LEADCHNL RA IMPEDANCE VALUE: 529 Ohm
MDC IDC MSMT LEADCHNL RV PACING THRESHOLD PULSEWIDTH: 0.4 ms
MDC IDC SESS DTM: 20170725114839
MDC IDC SET LEADCHNL RA PACING AMPLITUDE: 2.25 V
MDC IDC SET LEADCHNL RV PACING AMPLITUDE: 2 V
MDC IDC SET LEADCHNL RV PACING PULSEWIDTH: 0.4 ms
MDC IDC STAT BRADY AP VS PERCENT: 97 %

## 2016-05-01 NOTE — Progress Notes (Signed)
Pacemaker check in clinic. Normal device function. Thresholds, sensing, impedances consistent with previous measurements. Device programmed to maximize longevity. 692 mode switches (2.4% burden), AF/AFl +Eliquis and flecainide, longest 10.5 hours. No high ventricular rates noted. Device programmed at appropriate safety margins. Histogram distribution appropriate for patient activity level. Device programmed to optimize intrinsic conduction. Estimated longevity 8 years. Patient education completed. ROV with SK/B in 6 months.  Patient had concerns about an episode of hypotension he had while ill with nausea/diarrhea a few months ago.  He has not been taking lisinopril (per 11/14/15 phone note, Dr. Caryl Comes stopped lisinopril due to hypotension).  Patient continues to take diltiazem as prescribed.  Encouraged patient to call his primary MD or cardiologist (he sees both) at the [facility] for assistance with managing BP medications.  Patient verbalizes understanding of instructions.

## 2016-05-01 NOTE — Patient Instructions (Signed)
Follow-up with Dr. Caryl Comes in 6 months.  You will receive a letter in the mail reminding you to schedule this appointment.  Call the [facility] to schedule an appointment with your cardiologist or primary doctor regarding your blood pressure medication questions.

## 2016-06-18 ENCOUNTER — Other Ambulatory Visit: Payer: Self-pay | Admitting: Internal Medicine

## 2016-06-18 NOTE — Telephone Encounter (Signed)
Approved: #[id number] x [phone] 

## 2016-06-18 NOTE — Telephone Encounter (Signed)
Last filled 11-24-15 #60 Last True OV 04-29-15 for Medicare Wellness Acute OV 04-03-16 for Laceration No Future OV

## 2016-06-18 NOTE — Telephone Encounter (Signed)
Left refill on voice mail at pharmacy  

## 2016-08-17 ENCOUNTER — Telehealth: Payer: Self-pay | Admitting: Internal Medicine

## 2016-08-17 NOTE — Telephone Encounter (Signed)
I called and spoke with the patient regarding his elevated BP reading. He tells me that his BP went up to 180/106 today, but this was before he took his medications. He states that since he took his medication, his readings have been around 160/80. He states he usually doesn't take his BP, but wasn't feeling well today, having double vision, so he checked this. He reports no recent change in his diet with any increased sodium. I have advised him to please check his BP for the next several days, about 30 min-1 hour after he takes his medication, to see how this is running for him. If consistently high, I have advised he call back. He voices understanding.

## 2016-08-17 NOTE — Telephone Encounter (Signed)
Pt stated that he felt like his heart was skipping, advised pt that feeling of heart skipping was not abnormal for a pt that has atrial fibrillation. Pt also stated that his BP was 170/104 at 9:40am and he had taken his medications this morning at 8:15am. Encouraged pt to rest and would forward this information over to triage. Pt voiced understanding.

## 2016-08-17 NOTE — Telephone Encounter (Signed)
Pt called, because he received a letter to schedule 12 f/u. Pt did schedule for 10/30/2016. He states he feels as if his heart is "speeding up" . States this has been going on for the last couple of weeks. States this doesn't happen all the time, just occasionally. Please call.

## 2016-10-30 ENCOUNTER — Encounter: Payer: Self-pay | Admitting: Internal Medicine

## 2016-10-30 ENCOUNTER — Other Ambulatory Visit: Payer: Self-pay | Admitting: Internal Medicine

## 2016-10-30 ENCOUNTER — Ambulatory Visit (INDEPENDENT_AMBULATORY_CARE_PROVIDER_SITE_OTHER): Payer: Medicare Other | Admitting: Internal Medicine

## 2016-10-30 VITALS — BP 136/80 | HR 74 | Ht 71.0 in | Wt 178.0 lb

## 2016-10-30 DIAGNOSIS — Z950 Presence of cardiac pacemaker: Secondary | ICD-10-CM | POA: Diagnosis not present

## 2016-10-30 DIAGNOSIS — I495 Sick sinus syndrome: Secondary | ICD-10-CM

## 2016-10-30 DIAGNOSIS — I4891 Unspecified atrial fibrillation: Secondary | ICD-10-CM | POA: Diagnosis not present

## 2016-10-30 LAB — CUP PACEART INCLINIC DEVICE CHECK
Battery Remaining Longevity: 79 mo
Brady Statistic AP VP Percent: 0 %
Brady Statistic AS VS Percent: 4 %
Date Time Interrogation Session: 20180123163436
Implantable Lead Implant Date: 20110127
Implantable Lead Location: 753859
Implantable Lead Location: 753860
Implantable Pulse Generator Implant Date: 20110127
Lead Channel Pacing Threshold Amplitude: 1 V
Lead Channel Pacing Threshold Amplitude: 1 V
Lead Channel Pacing Threshold Pulse Width: 0.4 ms
Lead Channel Sensing Intrinsic Amplitude: 4 mV
Lead Channel Setting Pacing Amplitude: 2 V
Lead Channel Setting Pacing Amplitude: 2.5 V
Lead Channel Setting Pacing Pulse Width: 0.4 ms
Lead Channel Setting Sensing Sensitivity: 2 mV
MDC IDC LEAD IMPLANT DT: 20110127
MDC IDC MSMT BATTERY IMPEDANCE: 546 Ohm
MDC IDC MSMT BATTERY VOLTAGE: 2.79 V
MDC IDC MSMT LEADCHNL RA IMPEDANCE VALUE: 514 Ohm
MDC IDC MSMT LEADCHNL RA PACING THRESHOLD PULSEWIDTH: 0.4 ms
MDC IDC MSMT LEADCHNL RA SENSING INTR AMPL: 2.8 mV
MDC IDC MSMT LEADCHNL RV IMPEDANCE VALUE: 482 Ohm
MDC IDC STAT BRADY AP VS PERCENT: 95 %
MDC IDC STAT BRADY AS VP PERCENT: 1 %

## 2016-10-30 NOTE — Progress Notes (Signed)
Patient Care Team: [name], MD as PCP - General   HPI  [name] is a 83 y.o. male Seen in followup for pacemaker implanted for sinus node dysfunction in the context of paroxysmal atrial fibrillation. He has been on 1C antiarrhythmic therapy With flecainide.  1/15 Myoview scanning demonstrated no ischemia and normal left ventricular function   He continures to have paroxyxms of atrial fibrillation that are quite disconcerting and at his last visit we increased his flecainide from 50>>100 bid   He has felt much better CHADS-VASc score is 3.   He is currently taking apixaban. He is tolerating it well.   The patient denies chest pain,     There have been no palpitations, lightheadedness or syncope.   He is complaining of shortness of breath with exertion. There is no accompanying chest pain.  He has had no nocturnal dyspnea, orthopnea or peripheral edema.  He has been seen intercurrently at the [place]. They reduced his apixoban dose from 5--2.5 (see below)     date PR interval QRS duration Dose  1/11  122 0  2/11  124 50  1/15  137 75  5/15 196 132 100  11/15  136 100  1/17 220 142 100  1/18 316 148 100     Past Medical History      Past Medical History:  Diagnosis Date  . Acute diverticulitis 12/15   [facility]  . Acute diverticulitis   . Allergic rhinitis   . Atrial fibrillation or flutter 12/10   Has been paroxysmal. Patient has not tolerated coumadin due to headaches. ECHO (12/10) Mild LVH, EF 55%,   . BPH (benign prostatic hyperplasia)   . Chest pain 12/10   Lexiscan myoview with EF 55%, no ischemia or infarction  . Hyperlipemia   . Hypertension   . Osteoarthritis   . Pacemaker  medtronic 02/06/2010   Qualifier: Diagnosis of  By: Lovena Le, MD, Martyn Malay   . Sick sinus syndrome [facility])    S/P dual chamber Medtronic PCM    Past Surgical History:  Procedure Laterality Date  . dual chamber pacemaker  01/11   Dr. Lovena Le  . LAPAROSCOPIC  CHOLECYSTECTOMY  1/14   Dr Bary Castilla  . VASECTOMY  in his 56's    Current Outpatient Prescriptions  Medication Sig Dispense Refill  . acetaminophen (TYLENOL) 325 MG tablet Take 650 mg by mouth every 6 (six) hours as needed. Reported on 01/17/2016    . ALPRAZolam (XANAX) 0.25 MG tablet TAKE 1 TABLET BY MOUTH TWICE DAILY AS NEEDED FOR ANXIETY 60 tablet 0  . apixaban (ELIQUIS) 2.5 MG TABS tablet Take 2.5 mg by mouth 2 (two) times daily.    [name] diltiazem (TIAZAC) 180 MG 24 hr capsule Take 180 mg by mouth daily.    . flecainide (TAMBOCOR) 100 MG tablet Take 1 tablet (100 mg total) by mouth 2 (two) times daily. 180 tablet 3  . Glucosamine 500 MG CAPS Take by mouth daily.      . tamsulosin (FLOMAX) 0.4 MG CAPS capsule TAKE ONE CAPSULE BY MOUTH EVERY DAY 30 capsule 11   No current facility-administered medications for this visit.     No Known Allergies  Review of Systems negative except from HPI and PMH  Physical Exam BP 136/80 (BP Location: Left Arm, Patient Position: Sitting, Cuff Size: Normal)   Pulse 74   Ht 5\' 11"  (1.803 m)   Wt 178 lb (80.7 kg)  BMI 24.83 kg/m  Well developed and nourished in no acute distress HENT normal Neck supple with JVP-flat Carotids brisk and full without bruits Clear Device pocket well healed; without hematoma or erythema.  There is no tethering   Regular rate and rhythm2/6 murmurs  Abd-soft with active BS without hepatomegaly No Clubbing cyanosis edema Skin-warm and dry A & Oriented  Grossly normal sensory and motor function  ECG  NSR 63 22/14/44  Assessment and  Plan  AFib  Doing better on higher flecainide,  No significant change in QRS    Sinus node dysfunction 98% pacing     Chronotropic incompetence  DOE    Hypertension   1 AVB   Pacer Medtronic The patient's device was interrogated.  The information was reviewed. No changes were made in the programming.       [facility] ELIQUIS dose from 5--2.5 twice a day. Our last  creatinine 2/17 was 1.25. I've asked him to follow-up with the [facility] doctors concerning his creatinine. If it is less than 1.5, he should be on 5 mg twice daily.  I'm concerned about his new shortness of breath. Last assessment of LV function was 3 years ago. We'll check an echocardiogram.  In the event that this is unrevealing, I would undertake treadmill testing looking for chronotropic competence; preliminary work in the office today suggested that this was blunted and we have reprogrammed his slope 3--4 in both ADL rate as well as his exercise rate.  He asks whether his pacemaker can be followed at the [place]. We are glad to be available to help with whatever   He will let us know    In the absence of a history of ischemic heart disease, I think it is unlikely at his leg age that his dyspnea is related to ischemia although this needs to be considered  More than 50% of 45 min was spent in counseling related to the above

## 2016-10-30 NOTE — Patient Instructions (Signed)
Medication Instructions: - Your physician recommends that you continue on your current medications as directed. Please refer to the Current Medication list given to you today.  Labwork: - none ordered  Procedures/Testing: - .none ordered  Follow-Up: - Your physician wants you to follow-up in: 6 months with Raquel Sarna in the [place] year with Dr. Caryl Comes.  You will receive a reminder letter in the mail two months in advance. If you don't receive a letter, please call our office to schedule the follow-up appointment.  Any Additional Special Instructions Will Be Listed Below (If Applicable).     If you need a refill on your cardiac medications before your next appointment, please call your pharmacy.

## 2016-10-31 NOTE — Telephone Encounter (Signed)
Approved: #[id number] x [phone] 

## 2016-10-31 NOTE — Telephone Encounter (Signed)
Left refill on voice mail at pharmacy  

## 2016-10-31 NOTE — Telephone Encounter (Signed)
Last filled 06-18-16 #60 Last OV 01-30-16 Next OV 12-03-16

## 2016-11-05 ENCOUNTER — Encounter: Payer: Self-pay | Admitting: Internal Medicine

## 2016-11-05 ENCOUNTER — Telehealth: Payer: Self-pay | Admitting: Internal Medicine

## 2016-11-05 ENCOUNTER — Ambulatory Visit (INDEPENDENT_AMBULATORY_CARE_PROVIDER_SITE_OTHER): Payer: Medicare Other | Admitting: Internal Medicine

## 2016-11-05 VITALS — BP 136/88 | HR 83 | Temp 98.0°F | Wt 179.0 lb

## 2016-11-05 DIAGNOSIS — S60922A Unspecified superficial injury of left hand, initial encounter: Secondary | ICD-10-CM | POA: Diagnosis not present

## 2016-11-05 DIAGNOSIS — Z23 Encounter for immunization: Secondary | ICD-10-CM

## 2016-11-05 NOTE — Progress Notes (Signed)
Pre visit review using our clinic review tool, if applicable. No additional management support is needed unless otherwise documented below in the visit note. 

## 2016-11-05 NOTE — Telephone Encounter (Signed)
Patient states that his hand does not appear to be healing. He states that he has been putting neosporin on it and that he changed the bandage. Let him know that it can take some time to heal but that if he has any redness, swelling, or is warm to touch then he may need to have it assessed by his primary care provider. He states that it is red and has some swelling to those fingers. Advised him to have it checked when possible. He verbalized understanding of our conversation and had no further questions at this time.

## 2016-11-05 NOTE — Telephone Encounter (Signed)
Pt states where he fell in the office last week, he does not feel as if his hand is healing properly.He states he did change the bandage, but is not sure if it is getting infected.  Please call.

## 2016-11-05 NOTE — Progress Notes (Signed)
Subjective:    Patient ID: [name], male    DOB: [birth year], 83 y.o.   MRN: [id number]  HPI Here due to hand pain  At cardiologist office 6 days ago Tripped running up the steps Grabbed hand rail and went between 3rd and 4th fingers Bled considerably Still has swelling No redness or significant pain  No decrease in function  Current Outpatient Prescriptions on File Prior to Visit  Medication Sig Dispense Refill  . acetaminophen (TYLENOL) 325 MG tablet Take 650 mg by mouth every 6 (six) hours as needed. Reported on 01/17/2016    . ALPRAZolam (XANAX) 0.25 MG tablet TAKE 1 TABLET BY MOUTH TWICE DAILY AS NEEDED FOR ANXIETY 60 tablet 0  . apixaban (ELIQUIS) 2.5 MG TABS tablet Take 2.5 mg by mouth 2 (two) times daily.    [name] diltiazem (TIAZAC) 180 MG 24 hr capsule Take 180 mg by mouth daily.    . flecainide (TAMBOCOR) 100 MG tablet Take 1 tablet (100 mg total) by mouth 2 (two) times daily. 180 tablet 3  . Glucosamine 500 MG CAPS Take by mouth daily.      . tamsulosin (FLOMAX) 0.4 MG CAPS capsule TAKE ONE CAPSULE BY MOUTH EVERY DAY 30 capsule 11   No current facility-administered medications on file prior to visit.     No Known Allergies  Past Medical History:  Diagnosis Date  . Acute diverticulitis 12/15   [facility]  . Acute diverticulitis   . Allergic rhinitis   . Atrial fibrillation or flutter 12/10   Has been paroxysmal. Patient has not tolerated coumadin due to headaches. ECHO (12/10) Mild LVH, EF 55%,   . BPH (benign prostatic hyperplasia)   . Chest pain 12/10   Lexiscan myoview with EF 55%, no ischemia or infarction  . Hyperlipemia   . Hypertension   . Osteoarthritis   . Pacemaker  medtronic 02/06/2010   Qualifier: Diagnosis of  By: Lovena Le, MD, Martyn Malay   . Sick sinus syndrome [facility])    S/P dual chamber Medtronic PCM    Past Surgical History:  Procedure Laterality Date  . dual chamber pacemaker  01/11   Dr. Lovena Le  . LAPAROSCOPIC CHOLECYSTECTOMY  1/14   Dr Bary Castilla  . VASECTOMY  in his 4's    Family History  Problem Relation Age of Onset  . Hypertension Mother   . Alcohol abuse Father   . Bladder Cancer Neg Hx   . Prostate cancer Neg Hx   . Kidney cancer Neg Hx     Social History   Social History  . Marital status: Married    Spouse name: N/A  . Number of children: 3  . Years of education: N/A   Occupational History  . Retired Retired    Hydrologist   Social History Main Topics  . Smoking status: Former Research scientist (life sciences)  . Smokeless tobacco: Never Used     Comment: In [employer], quit in 1960's  . Alcohol use No  . Drug use: No  . Sexual activity: Not on file   Other Topics Concern  . Not on file   Social History Narrative   2 Daughters, one son killed in [place]      Has living will   Wife, then daughter Maudie Mercury, is health care POA   Would accept resuscitation attempts but no prolonged life support.   No tube feeding if cognitively unaware   Review of Systems  No fever Appetite is good No other joint problems  Objective:   Physical Exam  Musculoskeletal:  Slight tenderness at left 4th MCP Very slight swelling in palm but 3rd and 4th MCPs--but no warmth or tenderness Normal ROM and grip strength  Skin:  Abraded skin between left 4th and 5th fingers No bleeding No signs of infection          Assessment & Plan:

## 2016-11-05 NOTE — Assessment & Plan Note (Signed)
Mild Reassured Some of the pain is due to apparent mild bruising (mostly seems better) No action needed Continue neosporin topically

## 2016-11-05 NOTE — Addendum Note (Signed)
Addended by: Pilar Grammes on: 11/05/2016 03:40 PM   Modules accepted: Orders

## 2016-11-06 NOTE — Telephone Encounter (Signed)
Spoke with patient after reviewing Dr. Alla German OV note from 11/05/16.  Patient reports that Dr. Silvio Pate felt the skin tear was healing appropriately and that it will just take some more time.  He is aware to monitor the site to ensure that it continues to heal.    Regarding exertional ShOB, patient reports that it is about the same, despite PPM sensor reprogramming.  He agrees to call our office if he cannot have his echo done at the [place].  Patient is appreciative of call and denies additional questions or concerns at this time.

## 2016-11-28 ENCOUNTER — Ambulatory Visit (INDEPENDENT_AMBULATORY_CARE_PROVIDER_SITE_OTHER): Payer: Medicare Other

## 2016-11-28 VITALS — BP 122/80 | HR 61 | Temp 97.9°F | Ht 70.0 in | Wt 176.5 lb

## 2016-11-28 DIAGNOSIS — Z0000 Encounter for general adult medical examination without abnormal findings: Secondary | ICD-10-CM

## 2016-11-28 NOTE — Progress Notes (Signed)
PCP notes:   Health maintenance:  No gaps identified.  Abnormal screenings:   Mini-Cog score: 18/20  Patient concerns:   None  Nurse concerns:  None  Next PCP appt:   12/03/16 @ 1500

## 2016-11-28 NOTE — Progress Notes (Signed)
   Subjective:    Patient ID: [name], male    DOB: 1933/08/23, 83 y.o.   MRN: [id number]  HPI   I reviewed health advisor's note, was available for consultation, and agree with documentation and plan.  Review of Systems     Objective:   Physical Exam        Assessment & Plan:

## 2016-11-28 NOTE — Progress Notes (Signed)
Subjective:   [name] is a 83 y.o. male who presents for Medicare Annual/Subsequent preventive examination.  Review of Systems:  N/A Cardiac Risk Factors include: advanced age (>34men, >63 women);male gender;dyslipidemia;hypertension     Objective:    Vitals: BP 122/80 (BP Location: Right Arm, Patient Position: Sitting, Cuff Size: Normal)   Pulse 61   Temp 97.9 F (36.6 C) (Oral)   Ht 5\' 10"  (1.778 m) Comment: no shoes  Wt 176 lb 8 oz (80.1 kg)   SpO2 97%   BMI 25.33 kg/m   Body mass index is 25.33 kg/m.  Tobacco History  Smoking Status  . Former Smoker  Smokeless Tobacco  . Never Used    Comment: In [employer], quit in 1960's     Counseling given: No   Past Medical History:  Diagnosis Date  . Acute diverticulitis 12/15   [facility]  . Acute diverticulitis   . Allergic rhinitis   . Atrial fibrillation or flutter 12/10   Has been paroxysmal. Patient has not tolerated coumadin due to headaches. ECHO (12/10) Mild LVH, EF 55%,   . BPH (benign prostatic hyperplasia)   . Chest pain 12/10   Lexiscan myoview with EF 55%, no ischemia or infarction  . Hyperlipemia   . Hypertension   . Osteoarthritis   . Pacemaker  medtronic 02/06/2010   Qualifier: Diagnosis of  By: Lovena Le, MD, Martyn Malay   . Sick sinus syndrome [facility])    S/P dual chamber Medtronic PCM   Past Surgical History:  Procedure Laterality Date  . dual chamber pacemaker  01/11   Dr. Lovena Le  . LAPAROSCOPIC CHOLECYSTECTOMY  1/14   Dr Bary Castilla  . VASECTOMY  in his 24's   Family History  Problem Relation Age of Onset  . Hypertension Mother   . Alcohol abuse Father   . Bladder Cancer Neg Hx   . Prostate cancer Neg Hx   . Kidney cancer Neg Hx    History  Sexual Activity  . Sexual activity: Yes    Outpatient Encounter Prescriptions as of 11/28/2016  Medication Sig  . acetaminophen (TYLENOL) 325 MG tablet Take 650 mg by mouth every 6 (six) hours as needed. Reported on 01/17/2016  . ALPRAZolam (XANAX)  0.25 MG tablet TAKE 1 TABLET BY MOUTH TWICE DAILY AS NEEDED FOR ANXIETY  . apixaban (ELIQUIS) 2.5 MG TABS tablet Take 2.5 mg by mouth 2 (two) times daily.  [name] diltiazem (TIAZAC) 180 MG 24 hr capsule Take 180 mg by mouth daily.  . flecainide (TAMBOCOR) 100 MG tablet Take 1 tablet (100 mg total) by mouth 2 (two) times daily.  . Glucosamine 500 MG CAPS Take by mouth daily.    . tamsulosin (FLOMAX) 0.4 MG CAPS capsule TAKE ONE CAPSULE BY MOUTH EVERY DAY   No facility-administered encounter medications on file as of 11/28/2016.     Activities of Daily Living In your present state of health, do you have any difficulty performing the following activities: 11/28/2016  Hearing? Y  Vision? Y  Difficulty concentrating or making decisions? N  Walking or climbing stairs? N  Dressing or bathing? N  Doing errands, shopping? N  Preparing Food and eating ? N  Using the Toilet? N  In the past six months, have you accidently leaked urine? N  Do you have problems with loss of bowel control? N  Managing your Medications? N  Managing your Finances? N  Housekeeping or managing your Housekeeping? N  Some recent data might be hidden  Patient Care Team: [name], MD as PCP - General Estill Cotta, MD as Consulting Physician (Ophthalmology) Deboraha Sprang, MD as Consulting Physician (Cardiology)   Assessment:    Hearing Screening Comments: Bilateral hearing aids Vision Screening Comments: Last vision exam in Feb 2018 with Dr. Sandra Cockayne. Cataract surgery scheduled December 13, 2016  Exercise Activities and Dietary recommendations Current Exercise Habits: Home exercise routine, Type of exercise: Other - see comments;walking (golf), Time (Minutes): 30, Frequency (Times/Week): 3, Weekly Exercise (Minutes/Week): 90, Intensity: Mild, Exercise limited by: None identified  Goals    . Increase physical activity          Starting 11/28/2016, I will continue walk at least 30 min 2-3 days per week and  play golf 1-2 days per week.       Fall Risk Fall Risk  11/28/2016 04/29/2015 04/23/2014 04/23/2014  Falls in the past year? No No No No   Depression Screen PHQ 2/9 Scores 11/28/2016 04/29/2015 04/23/2014 04/23/2014  PHQ - 2 Score 0 0 0 0    Cognitive Function MMSE - Mini Mental State Exam 11/28/2016  Orientation to time 5  Orientation to Place 5  Registration 3  Attention/ Calculation 0  Recall 1  Recall-comments pt was unable to recall 2 of 3 words  Language- name 2 objects 0  Language- repeat 1  Language- follow 3 step command 3  Language- read & follow direction 0  Write a sentence 0  Copy design 0  Total score 18       PLEASE NOTE: A Mini-Cog screen was completed. Maximum score is 20. A value of 0 denotes this part of Folstein MMSE was not completed or the patient failed this part of the Mini-Cog screening.   Mini-Cog Screening Orientation to Time - Max 5 pts Orientation to Place - Max 5 pts Registration - Max 3 pts Recall - Max 3 pts Language Repeat - Max 1 pts Language Follow 3 Step Command - Max 3 pts   Immunization History  Administered Date(s) Administered  . Influenza Split 07/27/2011  . Influenza Whole 07/12/2009, 07/11/2010  . Influenza,inj,Quad PF,36+ Mos 08/26/2013, 09/17/2014, 10/20/2015, 11/05/2016  . Pneumococcal Conjugate-13 04/23/2014  . Pneumococcal Polysaccharide-23 07/12/2009  . Td 10/08/2006, 04/04/2016  . Zoster 01/24/2011   Screening Tests Health Maintenance  Topic Date Due  . TETANUS/TDAP  04/04/2026  . INFLUENZA VACCINE  Completed  . PNA vac Low Risk Adult  Completed      Plan:     I have personally reviewed and addressed the Medicare Annual Wellness questionnaire and have noted the following in the patient's chart:  A. Medical and social history B. Use of alcohol, tobacco or illicit drugs  C. Current medications and supplements D. Functional ability and status E.  Nutritional status F.  Physical activity G. Advance  directives H. List of other physicians I.  Hospitalizations, surgeries, and ER visits in previous 12 months J.  [place] to include hearing, vision, cognitive, depression L. Referrals and appointments - none  In addition, I have reviewed and discussed with patient certain preventive protocols, quality metrics, and best practice recommendations. A written personalized care plan for preventive services as well as general preventive health recommendations were provided to patient.  See attached scanned questionnaire for additional information.   Signed,   Lindell Noe, MHA, BS, LPN Health Coach

## 2016-11-28 NOTE — Progress Notes (Signed)
Pre visit review using our clinic review tool, if applicable. No additional management support is needed unless otherwise documented below in the visit note. 

## 2016-11-28 NOTE — Patient Instructions (Signed)
Mr. [name] , Thank you for taking time to come for your Medicare Wellness Visit. I appreciate your ongoing commitment to your health goals. Please review the following plan we discussed and let me know if I can assist you in the future.   These are the goals we discussed: Goals    . Increase physical activity          Starting 11/28/2016, I will continue walk at least 30 min 2-3 days per week and play golf 1-2 days per week.        This is a list of the screening recommended for you and due dates:  Health Maintenance  Topic Date Due  . Tetanus Vaccine  04/04/2026  . Flu Shot  Completed  . Pneumonia vaccines  Completed   Preventive Care for Adults  A healthy lifestyle and preventive care can promote health and wellness. Preventive health guidelines for adults include the following key practices.  . A routine yearly physical is a good way to check with your health care provider about your health and preventive screening. It is a chance to share any concerns and updates on your health and to receive a thorough exam.  . Visit your dentist for a routine exam and preventive care every 6 months. Brush your teeth twice a day and floss once a day. Good oral hygiene prevents tooth decay and gum disease.  . The frequency of eye exams is based on your age, health, family medical history, use  of contact lenses, and other factors. Follow your health care provider's ecommendations for frequency of eye exams.  . Eat a healthy diet. Foods like vegetables, fruits, whole grains, low-fat dairy products, and lean protein foods contain the nutrients you need without too many calories. Decrease your intake of foods high in solid fats, added sugars, and salt. Eat the right amount of calories for you. Get information about a proper diet from your health care provider, if necessary.  . Regular physical exercise is one of the most important things you can do for your health. Most adults should get at least 150  minutes of moderate-intensity exercise (any activity that increases your heart rate and causes you to sweat) each week. In addition, most adults need muscle-strengthening exercises on 2 or more days a week.  Silver Sneakers may be a benefit available to you. To determine eligibility, you may visit the website: www.silversneakers.com or contact program at 442-882-3752 Mon-Fri between 8AM-8PM.   . Maintain a healthy weight. The body mass index (BMI) is a screening tool to identify possible weight problems. It provides an estimate of body fat based on height and weight. Your health care provider can find your BMI and can help you achieve or maintain a healthy weight.   For adults 20 years and older: ? A BMI below 18.5 is considered underweight. ? A BMI of 18.5 to 24.9 is normal. ? A BMI of 25 to 29.9 is considered overweight. ? A BMI of 30 and above is considered obese.   . Maintain normal blood lipids and cholesterol levels by exercising and minimizing your intake of saturated fat. Eat a balanced diet with plenty of fruit and vegetables. Blood tests for lipids and cholesterol should begin at age 79 and be repeated every 5 years. If your lipid or cholesterol levels are high, you are over 50, or you are at high risk for heart disease, you may need your cholesterol levels checked more frequently. Ongoing high lipid and cholesterol levels should  be treated with medicines if diet and exercise are not working.  . If you smoke, find out from your health care provider how to quit. If you do not use tobacco, please do not start.  . If you choose to drink alcohol, please do not consume more than 2 drinks per day. One drink is considered to be 12 ounces (355 mL) of beer, 5 ounces (148 mL) of wine, or 1.5 ounces (44 mL) of liquor.  . If you are 33-9 years old, ask your health care provider if you should take aspirin to prevent strokes.  . Use sunscreen. Apply sunscreen liberally and repeatedly throughout  the day. You should seek shade when your shadow is shorter than you. Protect yourself by wearing long sleeves, pants, a wide-brimmed hat, and sunglasses year round, whenever you are outdoors.  . Once a month, do a whole body skin exam, using a mirror to look at the skin on your back. Tell your health care provider of new moles, moles that have irregular borders, moles that are larger than a pencil eraser, or moles that have changed in shape or color.

## 2016-12-03 ENCOUNTER — Encounter: Payer: Self-pay | Admitting: Internal Medicine

## 2016-12-03 ENCOUNTER — Ambulatory Visit (INDEPENDENT_AMBULATORY_CARE_PROVIDER_SITE_OTHER): Payer: Medicare Other | Admitting: Internal Medicine

## 2016-12-03 VITALS — BP 138/90 | HR 70 | Temp 97.7°F | Wt 178.0 lb

## 2016-12-03 DIAGNOSIS — I10 Essential (primary) hypertension: Secondary | ICD-10-CM | POA: Diagnosis not present

## 2016-12-03 DIAGNOSIS — I495 Sick sinus syndrome: Secondary | ICD-10-CM

## 2016-12-03 DIAGNOSIS — I4891 Unspecified atrial fibrillation: Secondary | ICD-10-CM

## 2016-12-03 DIAGNOSIS — K219 Gastro-esophageal reflux disease without esophagitis: Secondary | ICD-10-CM

## 2016-12-03 DIAGNOSIS — F39 Unspecified mood [affective] disorder: Secondary | ICD-10-CM | POA: Insufficient documentation

## 2016-12-03 NOTE — Assessment & Plan Note (Signed)
BP Readings from Last 3 Encounters:  12/03/16 138/90  11/28/16 122/80  11/05/16 136/88   Good control Doesn't seem to be primarily hypotensive spell--?rate changes or a fib paroxysms? Just on CCB

## 2016-12-03 NOTE — Assessment & Plan Note (Signed)
Still on flecainide and eliquis Has these spells that are not clear cut---don't sound ischemic but could indicate chronotropic problems Pacer was adjusted at last visit with Dr Caryl Comes Considering echo if [place] doesn't look into this any further

## 2016-12-03 NOTE — Progress Notes (Signed)
Pre visit review using our clinic review tool, if applicable. No additional management support is needed unless otherwise documented below in the visit note. 

## 2016-12-03 NOTE — Assessment & Plan Note (Signed)
Has some anxiety He wonders if his spells could be from nerves---doesn't really sound like that (although if extended, the alprazolam helps)

## 2016-12-03 NOTE — Patient Instructions (Signed)
If your spells continue, and the [place] doesn't do any testing--please let me or Dr Caryl Comes know.

## 2016-12-03 NOTE — Assessment & Plan Note (Signed)
Rare symptoms now Uses ranitidine prn on ly

## 2016-12-03 NOTE — Progress Notes (Signed)
Subjective:    Patient ID: [name], male    DOB: November 30, 1932, 83 y.o.   MRN: [id number]  HPI Here for follow up of chronic health conditions Reviewed recent Annual Medicare wellness  Continues to see Dr Caryl Comes about the atrial fib Will get tired---all of the sudden--and feel SOB When this happens, BP will be down (112/60 ) Can happen at any time or at rest/with activity Will usually resolve if he sits for 10-15 minutes  Doesn't clearly feel like this is atrial fib--does get sense or racing rarely though Pacemaker is working okay--but they did increase the rate (but he still gets those spells) No chest pain When not in these spells--he has good stamina May occur as much as 3-4 times a week  Voids fair Slow stream--- takes a while. No dribbling Nocturia x 2-3 times--- stable  Worries about all these medical things Anxiety acts up at times--will use the alprazolam and it helps (even for the spells he describes) No depression and anhedonia Still plays golf regularly  No headaches Some dizziness--gets blurred vision (especially with spells) No edema  GERD is better No regular symptoms Uses ranitidine prn No dysphagia  Current Outpatient Prescriptions on File Prior to Visit  Medication Sig Dispense Refill  . acetaminophen (TYLENOL) 325 MG tablet Take 650 mg by mouth every 6 (six) hours as needed. Reported on 01/17/2016    . ALPRAZolam (XANAX) 0.25 MG tablet TAKE 1 TABLET BY MOUTH TWICE DAILY AS NEEDED FOR ANXIETY 60 tablet 0  . apixaban (ELIQUIS) 2.5 MG TABS tablet Take 2.5 mg by mouth 2 (two) times daily.    [name] diltiazem (TIAZAC) 180 MG 24 hr capsule Take 180 mg by mouth daily.    . flecainide (TAMBOCOR) 100 MG tablet Take 1 tablet (100 mg total) by mouth 2 (two) times daily. 180 tablet 3  . Glucosamine 500 MG CAPS Take by mouth daily.      . tamsulosin (FLOMAX) 0.4 MG CAPS capsule TAKE ONE CAPSULE BY MOUTH EVERY DAY 30 capsule 11   No current facility-administered  medications on file prior to visit.     No Known Allergies  Past Medical History:  Diagnosis Date  . Acute diverticulitis 12/15   [facility]  . Acute diverticulitis   . Allergic rhinitis   . Atrial fibrillation or flutter 12/10   Has been paroxysmal. Patient has not tolerated coumadin due to headaches. ECHO (12/10) Mild LVH, EF 55%,   . BPH (benign prostatic hyperplasia)   . Chest pain 12/10   Lexiscan myoview with EF 55%, no ischemia or infarction  . Hyperlipemia   . Hypertension   . Osteoarthritis   . Pacemaker  medtronic 02/06/2010   Qualifier: Diagnosis of  By: Lovena Le, MD, Martyn Malay   . Sick sinus syndrome [facility])    S/P dual chamber Medtronic PCM    Past Surgical History:  Procedure Laterality Date  . dual chamber pacemaker  01/11   Dr. Lovena Le  . LAPAROSCOPIC CHOLECYSTECTOMY  1/14   Dr Bary Castilla  . VASECTOMY  in his 3's    Family History  Problem Relation Age of Onset  . Hypertension Mother   . Alcohol abuse Father   . Bladder Cancer Neg Hx   . Prostate cancer Neg Hx   . Kidney cancer Neg Hx     Social History   Social History  . Marital status: Married    Spouse name: N/A  . Number of children: 3  . Years  of education: N/A   Occupational History  . Retired Retired    Hydrologist   Social History Main Topics  . Smoking status: Former Research scientist (life sciences)  . Smokeless tobacco: Never Used     Comment: In [employer], quit in 1960's  . Alcohol use No  . Drug use: No  . Sexual activity: Yes   Other Topics Concern  . Not on file   Social History Narrative   2 Daughters, one son killed in [place]      Has living will   Wife, then daughter Maudie Mercury, is health care POA   Would accept resuscitation attempts but no prolonged life support.   No tube feeding if cognitively unaware    Review of Systems Trouble with vision---due for cataracts soon  Sleeps fairly well Appetite is good Weight is stable Bowels are slow-- takes the miralax with good effects    Objective:    Physical Exam  Constitutional: He appears well-nourished. No distress.  HENT:  Mouth/Throat: Oropharynx is clear and moist. No oropharyngeal exudate.  Neck: Normal range of motion. Neck supple. No thyromegaly present.  Cardiovascular: Normal rate, regular rhythm, normal heart sounds and intact distal pulses.  Exam reveals no gallop.   No murmur heard. Pulmonary/Chest: Effort normal and breath sounds normal. No respiratory distress. He has no wheezes. He has no rales.  Abdominal: Soft. There is no tenderness.  Musculoskeletal: He exhibits no edema or tenderness.  Lymphadenopathy:    He has no cervical adenopathy.  Skin: No rash noted. No erythema.  Psychiatric: He has a normal mood and affect. His behavior is normal.          Assessment & Plan:

## 2016-12-03 NOTE — Assessment & Plan Note (Signed)
Now with pacer

## 2016-12-04 LAB — COMPREHENSIVE METABOLIC PANEL
ALK PHOS: 58 U/L (ref 39–117)
ALT: 10 U/L (ref 0–53)
AST: 14 U/L (ref 0–37)
Albumin: 4.1 g/dL (ref 3.5–5.2)
BUN: 15 mg/dL (ref 6–23)
CHLORIDE: 104 meq/L (ref 96–112)
CO2: 26 mEq/L (ref 19–32)
CREATININE: 1.09 mg/dL (ref 0.40–1.50)
Calcium: 9.1 mg/dL (ref 8.4–10.5)
GFR: 68.86 mL/min (ref >60.00)
Glucose, Bld: 82 mg/dL (ref 70–99)
Potassium: 3.9 mEq/L (ref 3.5–5.1)
SODIUM: 139 meq/L (ref 135–145)
TOTAL PROTEIN: 7.6 g/dL (ref 6.0–8.3)
Total Bilirubin: 0.6 mg/dL (ref 0.2–1.2)

## 2016-12-04 LAB — CBC WITH DIFFERENTIAL/PLATELET
BASOS PCT: 1.2 % (ref 0.0–3.0)
Basophils Absolute: 0.1 10*3/uL (ref 0.0–0.1)
EOS ABS: 0 10*3/uL (ref 0.0–0.7)
Eosinophils Relative: 1 % (ref 0.0–5.0)
HCT: 41.3 % (ref 39.0–52.0)
HEMOGLOBIN: 14.4 g/dL (ref 13.0–17.0)
Lymphocytes Relative: 46.2 % — ABNORMAL HIGH (ref 12.0–46.0)
Lymphs Abs: 2.1 10*3/uL (ref 0.7–4.0)
MCHC: 34.9 g/dL (ref 30.0–36.0)
MCV: 90.7 fl (ref 78.0–100.0)
MONO ABS: 0.4 10*3/uL (ref 0.1–1.0)
Monocytes Relative: 8.6 % (ref 3.0–12.0)
Neutro Abs: 2 10*3/uL (ref 1.4–7.7)
Neutrophils Relative %: 43 % (ref 43.0–77.0)
PLATELETS: 253 10*3/uL (ref 150.0–400.0)
RBC: 4.55 Mil/uL (ref 4.22–5.81)
RDW: 13.2 % (ref 11.5–15.5)
WBC: 4.6 10*3/uL (ref 4.0–10.5)

## 2016-12-04 LAB — LIPID PANEL
CHOLESTEROL: 214 mg/dL — AB (ref 0–200)
HDL: 57.4 mg/dL (ref >39.00)
LDL CALC: 146 mg/dL — AB (ref 0–99)
NonHDL: 156.42
TRIGLYCERIDES: 53 mg/dL (ref 0.0–149.0)
Total CHOL/HDL Ratio: 4
VLDL: 10.6 mg/dL (ref 0.0–40.0)

## 2016-12-04 LAB — T4, FREE: Free T4: 0.78 ng/dL (ref 0.60–1.60)

## 2016-12-05 ENCOUNTER — Encounter: Payer: Self-pay | Admitting: *Deleted

## 2016-12-20 ENCOUNTER — Encounter: Payer: Self-pay | Admitting: *Deleted

## 2016-12-20 NOTE — Pre-Procedure Instructions (Signed)
CARDIAC CLEARANCE NOTE/ ECHO,AS INSTRUCTED BY DR Donata Duff CALLED AND FAXED TO ELLEN AT [place]. PATIENT SCHEDULED FOR ECHO [facility] [place] 01/04/17. NEEDS PRIOR TO CATARACT SURGERY

## 2017-01-08 ENCOUNTER — Encounter: Payer: Self-pay | Admitting: *Deleted

## 2017-01-08 NOTE — Pre-Procedure Instructions (Signed)
RECEIVED ECHO FROM [place]. DID NOT SIGN CLEARANCE REQUEST. FAXED TO DR Olin Pia AND SPOKE WITH SABRINA

## 2017-01-14 NOTE — Pre-Procedure Instructions (Signed)
ECHO FROM [facility] ON CHART. CLEARANCE FROM DR Olin Pia ON CHART FROM 01/09/17

## 2017-01-15 MED ORDER — CYCLOPENTOLATE HCL 2 % OP SOLN
1.0000 [drp] | OPHTHALMIC | Status: AC
  Administered 2017-01-16 (×4): 1 [drp] via OPHTHALMIC

## 2017-01-15 MED ORDER — MOXIFLOXACIN HCL 0.5 % OP SOLN
1.0000 [drp] | OPHTHALMIC | Status: AC
  Administered 2017-01-16 (×3): 1 [drp] via OPHTHALMIC

## 2017-01-15 MED ORDER — PHENYLEPHRINE HCL 10 % OP SOLN
1.0000 [drp] | OPHTHALMIC | Status: AC
  Administered 2017-01-16 (×4): 1 [drp] via OPHTHALMIC

## 2017-01-15 NOTE — H&P (Signed)
See scanned note.

## 2017-01-16 ENCOUNTER — Encounter
Admission: RE | Disposition: A | Discharge status: Home / Self Care | Payer: Self-pay | Source: Ambulatory Visit | Attending: Ophthalmology

## 2017-01-16 ENCOUNTER — Encounter: Payer: Self-pay | Admitting: *Deleted

## 2017-01-16 ENCOUNTER — Ambulatory Visit: Payer: Non-veteran care | Admitting: Anesthesiology

## 2017-01-16 ENCOUNTER — Ambulatory Visit
Admission: RE | Admit: 2017-01-16 | Discharge: 2017-01-16 | Disposition: A | Discharge status: Home / Self Care | Payer: Non-veteran care | Source: Ambulatory Visit | Attending: Ophthalmology | Admitting: Ophthalmology

## 2017-01-16 DIAGNOSIS — Z9049 Acquired absence of other specified parts of digestive tract: Secondary | ICD-10-CM | POA: Diagnosis not present

## 2017-01-16 DIAGNOSIS — I10 Essential (primary) hypertension: Secondary | ICD-10-CM | POA: Diagnosis not present

## 2017-01-16 DIAGNOSIS — I4891 Unspecified atrial fibrillation: Secondary | ICD-10-CM | POA: Insufficient documentation

## 2017-01-16 DIAGNOSIS — Z950 Presence of cardiac pacemaker: Secondary | ICD-10-CM | POA: Insufficient documentation

## 2017-01-16 DIAGNOSIS — Z87891 Personal history of nicotine dependence: Secondary | ICD-10-CM | POA: Diagnosis not present

## 2017-01-16 DIAGNOSIS — H9193 Unspecified hearing loss, bilateral: Secondary | ICD-10-CM | POA: Insufficient documentation

## 2017-01-16 DIAGNOSIS — H2512 Age-related nuclear cataract, left eye: Secondary | ICD-10-CM | POA: Insufficient documentation

## 2017-01-16 DIAGNOSIS — N400 Enlarged prostate without lower urinary tract symptoms: Secondary | ICD-10-CM | POA: Insufficient documentation

## 2017-01-16 HISTORY — PX: CATARACT EXTRACTION W/PHACO: SHX586

## 2017-01-16 HISTORY — DX: Presence of cardiac pacemaker: Z95.0

## 2017-01-16 HISTORY — DX: Cardiac arrhythmia, unspecified: I49.9

## 2017-01-16 HISTORY — DX: Unspecified hearing loss, unspecified ear: H91.90

## 2017-01-16 SURGERY — PHACOEMULSIFICATION, CATARACT, WITH IOL INSERTION
Anesthesia: General | Site: Eye | Laterality: Left | Wound class: Clean

## 2017-01-16 MED ORDER — MOXIFLOXACIN HCL 0.5 % OP SOLN
OPHTHALMIC | Status: DC | PRN
  Administered 2017-01-16: 0.2 mL via OPHTHALMIC

## 2017-01-16 MED ORDER — ALFENTANIL 500 MCG/ML IJ INJ
INJECTION | INTRAMUSCULAR | Status: DC | PRN
  Administered 2017-01-16 (×2): 500 ug via INTRAVENOUS

## 2017-01-16 MED ORDER — CARBACHOL 0.01 % IO SOLN
INTRAOCULAR | Status: DC | PRN
  Administered 2017-01-16: 0.5 mL via INTRAOCULAR

## 2017-01-16 MED ORDER — NA CHONDROIT SULF-NA HYALURON 40-17 MG/ML IO SOLN
INTRAOCULAR | Status: DC | PRN
  Administered 2017-01-16: 1 mL via INTRAOCULAR

## 2017-01-16 MED ORDER — TETRACAINE HCL 0.5 % OP SOLN
OPHTHALMIC | Status: AC
  Filled 2017-01-16: qty 2

## 2017-01-16 MED ORDER — BSS IO SOLN
INTRAOCULAR | Status: DC | PRN
  Administered 2017-01-16: 10:00:00 via OPHTHALMIC

## 2017-01-16 MED ORDER — EPINEPHRINE PF 1 MG/ML IJ SOLN
INTRAMUSCULAR | Status: AC
  Filled 2017-01-16: qty 2

## 2017-01-16 MED ORDER — HYALURONIDASE HUMAN 150 UNIT/ML IJ SOLN
INTRAMUSCULAR | Status: AC
  Filled 2017-01-16: qty 1

## 2017-01-16 MED ORDER — ONDANSETRON HCL 4 MG/2ML IJ SOLN: 4.0000 mg | Freq: Once | INTRAMUSCULAR | Status: DC | PRN

## 2017-01-16 MED ORDER — POVIDONE-IODINE 5 % OP SOLN
OPHTHALMIC | Status: DC | PRN
  Administered 2017-01-16: 1 via OPHTHALMIC

## 2017-01-16 MED ORDER — LIDOCAINE HCL (PF) 4 % IJ SOLN
INTRAMUSCULAR | Status: DC | PRN
  Administered 2017-01-16: 9 mL via OPHTHALMIC

## 2017-01-16 MED ORDER — NA CHONDROIT SULF-NA HYALURON 40-17 MG/ML IO SOLN
INTRAOCULAR | Status: AC
  Filled 2017-01-16: qty 1

## 2017-01-16 MED ORDER — LIDOCAINE HCL (PF) 4 % IJ SOLN
INTRAOCULAR | Status: DC | PRN
  Administered 2017-01-16: 4 mL via OPHTHALMIC

## 2017-01-16 MED ORDER — FENTANYL CITRATE (PF) 100 MCG/2ML IJ SOLN: 25.0000 ug | INTRAMUSCULAR | Status: DC | PRN

## 2017-01-16 MED ORDER — TETRACAINE HCL 0.5 % OP SOLN
OPHTHALMIC | Status: DC | PRN
  Administered 2017-01-16: 1 [drp] via OPHTHALMIC

## 2017-01-16 MED ORDER — POVIDONE-IODINE 5 % OP SOLN
OPHTHALMIC | Status: AC
  Filled 2017-01-16: qty 30

## 2017-01-16 MED ORDER — CEFUROXIME OPHTHALMIC INJECTION 1 MG/0.1 ML
INJECTION | OPHTHALMIC | Status: DC | PRN
  Administered 2017-01-16: 1 mg via INTRACAMERAL

## 2017-01-16 MED ORDER — CEFUROXIME OPHTHALMIC INJECTION 1 MG/0.1 ML
INJECTION | OPHTHALMIC | Status: AC
  Filled 2017-01-16: qty 0.1

## 2017-01-16 MED ORDER — SODIUM CHLORIDE 0.9 % IV SOLN
INTRAVENOUS | Status: DC
  Administered 2017-01-16 (×2): via INTRAVENOUS

## 2017-01-16 MED ORDER — BUPIVACAINE HCL (PF) 0.75 % IJ SOLN
INTRAMUSCULAR | Status: AC
  Filled 2017-01-16: qty 10

## 2017-01-16 SURGICAL SUPPLY — 29 items

## 2017-01-16 NOTE — Anesthesia Postprocedure Evaluation (Signed)
Anesthesia Post Note  Patient: [name]  Procedure(s) Performed: Procedure(s) (LRB): CATARACT EXTRACTION PHACO AND INTRAOCULAR LENS PLACEMENT (IOC) (Left)  Patient location during evaluation: PACU Anesthesia Type: MAC Level of consciousness: awake Pain management: pain level controlled Vital Signs Assessment: post-procedure vital signs reviewed and stable Respiratory status: spontaneous breathing Cardiovascular status: blood pressure returned to baseline Postop Assessment: no headache Anesthetic complications: yes     Last Vitals:  Vitals:   01/16/17 0739  BP: (!) 148/78  Pulse: 78  Resp: 18  Temp: 36.9 C    Last Pain:  Vitals:   01/16/17 0739  TempSrc: Oral                 Buckner Malta

## 2017-01-16 NOTE — Transfer of Care (Signed)
Immediate Anesthesia Transfer of Care Note  Patient: Kolten Ryback Maffia  Procedure(s) Performed: Procedure(s) with comments: CATARACT EXTRACTION PHACO AND INTRAOCULAR LENS PLACEMENT (IOC) (Left) - [place] 1:51.3 AP% 25.9 CDE 54.80 FLUID PACK LOT # [id number]  Patient Location: PACU  Anesthesia Type:MAC  Level of Consciousness: awake  Airway & Oxygen Therapy: Patient Spontanous Breathing and Patient connected to nasal cannula oxygen  Post-op Assessment: Report given to RN and Post -op Vital signs reviewed and stable  Post vital signs: Reviewed and stable  Last Vitals:  Vitals:   01/16/17 0739  BP: (!) 148/78  Pulse: 78  Resp: 18  Temp: 36.9 C    Last Pain:  Vitals:   01/16/17 0739  TempSrc: Oral         Complications: No apparent anesthesia complications

## 2017-01-16 NOTE — Op Note (Signed)
Date of Surgery: 01/16/2017 Date of Dictation: 01/16/2017 10:19 AM Pre-operative Diagnosis:  Nuclear Sclerotic Cataract left Eye Post-operative Diagnosis: same Procedure performed: Extra-capsular Cataract Extraction (ECCE) with placement of a posterior chamber intraocular lens (IOL) left Eye IOL:  Implant Name Type Inv. Item Serial No. Manufacturer Lot No. LRB No. Used  LENS IOL ACRYSOF IQ 21.0 - [id number] Intraocular Lens LENS IOL ACRYSOF IQ 21.0 [id number] ALCON   Left 1   Anesthesia: 2% Lidocaine and 4% Marcaine in a 50/50 mixture with 10 unites/ml of Hylenex given as a peribulbar Anesthesiologist: Anesthesiologist: Alvin Critchley, MD CRNA: Allean Found, CRNA Complications: none Estimated Blood Loss: less than 1 ml  Description of procedure:  The patient was given anesthesia and sedation via intravenous access. The patient was then prepped and draped in the usual fashion. A 25-gauge needle was bent for initiating the capsulorhexis. A 5-0 silk suture was placed through the conjunctiva superior and inferiorly to serve as bridle sutures. Hemostasis was obtained at the superior limbus using an eraser cautery. A partial thickness groove was made at the anterior surgical limbus with a 64 Beaver blade and this was dissected anteriorly with an Avaya. The anterior chamber was entered at 10 o'clock with a 1.0 mm paracentesis knife and through the lamellar dissection with a 2.6 mm Alcon keratome. Epi-Shugarcaine 0.5 CC [9 cc BSS Plus (Alcon), 3 cc 4% preservative-free lidocaine (Hospira) and 4 cc 1:1000 preservative-free, bisulfite-free epinephrine] was injected into the anterior chamber via the paracentesis tract. Epi-Shugarcaine 0.5 CC [9 cc BSS Plus (Alcon), 3 cc 4% preservative-free lidocaine (Hospira) and 4 cc 1:1000 preservative-free, bisulfite-free epinephrine] was injected into the anterior chamber via the paracentesis tract. DiscoVisc was injected to replace the aqueous and a  continuous tear curvilinear capsulorhexis was performed using a bent 25-gauge needle.  Balance salt on a syringe was used to perform hydro-dissection and phacoemulsification was carried out using a divide and conquer technique. Procedure(s) with comments: CATARACT EXTRACTION PHACO AND INTRAOCULAR LENS PLACEMENT (IOC) (Left) - [place] 1:51.3 AP% 25.9 CDE 54.80 FLUID PACK LOT # [id number] H. Irrigation/aspiration was used to remove the residual cortex and the capsular bag was inflated with DiscoVisc. The intraocular lens was inserted into the capsular bag using a pre-loaded UltraSert Delivery System. Irrigation/aspiration was used to remove the residual DiscoVisc. The wound was inflated with balanced salt and checked for leaks. None were found. Miostat was injected via the paracentesis track and 0.1 ml of cefuroxime containing 1 mg of drug  was injected via the paracentesis track. The wound was checked for leaks again and none were found.   The bridal sutures were removed and two drops of Vigamox were placed on the eye. An eye shield was placed to protect the eye and the patient was discharged to the recovery area in good condition.   Randel Hargens MD

## 2017-01-16 NOTE — Anesthesia Preprocedure Evaluation (Signed)
Anesthesia Evaluation  Patient identified by MRN, date of birth, ID band Patient awake    Reviewed: Allergy & Precautions, NPO status , Patient's Chart, lab work & pertinent test results  Airway Mallampati: II       Dental  (+) Caps   Pulmonary former smoker,    Pulmonary exam normal        Cardiovascular hypertension, Pt. on medications Normal cardiovascular exam+ dysrhythmias Atrial Fibrillation + pacemaker      Neuro/Psych PSYCHIATRIC DISORDERS Mood disordernegative neurological ROS     GI/Hepatic Neg liver ROS, GERD  ,  Endo/Other  negative endocrine ROS  Renal/GU negative Renal ROS     Musculoskeletal  (+) Arthritis , Osteoarthritis,    Abdominal Normal abdominal exam  (+)   Peds  Hematology negative hematology ROS (+)   Anesthesia Other Findings Past Medical History: 12/15: Acute diverticulitis     Comment: [facility] No date: Acute diverticulitis No date: Allergic rhinitis 12/10: Atrial fibrillation or flutter     Comment: Has been  paroxysmal. Patient has not tolerated              coumadin due to headaches. ECHO (12/10) Mild               LVH, EF 55%,  No date: BPH (benign prostatic hyperplasia) 12/10: Chest pain     Comment: Lexiscan myoview with EF 55%, no ischemia or               infarction No date: Dysrhythmia     Comment: AFIB No date: HOH (hard of hearing) No date: Hyperlipemia No date: Hypertension No date: Osteoarthritis 02/06/2010: Pacemaker  medtronic     Comment: Qualifier: Diagnosis of  By: Lovena Le, MD, Martyn Malay  No date: Presence of permanent cardiac pacemaker No date: Sick sinus syndrome ([place])     Comment: S/P dual chamber Medtronic PCM EF 60%  Reproductive/Obstetrics                             Anesthesia Physical Anesthesia Plan  ASA: III  Anesthesia Plan: General   Post-op Pain Management:    Induction: Intravenous  Airway  Management Planned: Nasal Cannula  Additional Equipment:   Intra-op Plan:   Post-operative Plan:   Informed Consent: I have reviewed the patients History and Physical, chart, labs and discussed the procedure including the risks, benefits and alternatives for the proposed anesthesia with the patient or authorized representative who has indicated his/her understanding and acceptance.     Plan Discussed with: CRNA and Surgeon  Anesthesia Plan Comments:         Anesthesia Quick Evaluation

## 2017-01-16 NOTE — Interval H&P Note (Signed)
History and Physical Interval Note:  01/16/2017 9:34 AM  [name]  has presented today for surgery, with the diagnosis of NUCLEAR SCLEROTIC CATARACT LEFT EYE  The various methods of treatment have been discussed with the patient and family. After consideration of risks, benefits and other options for treatment, the patient has consented to  Procedure(s) with comments: CATARACT EXTRACTION PHACO AND INTRAOCULAR LENS PLACEMENT (IOC) (Left) - [place] AP% CDE FLUID PACK LOT # [id number] H as a surgical intervention .  The patient's history has been reviewed, patient examined, no change in status, stable for surgery.  I have reviewed the patient's chart and labs.  Questions were answered to the patient's satisfaction.     Nikole Swartzentruber

## 2017-01-16 NOTE — Anesthesia Procedure Notes (Signed)
Procedure Name: MAC Date/Time: 01/16/2017 9:45 AM Performed by: Allean Found Pre-anesthesia Checklist: Patient identified, Emergency Drugs available, Suction available, Patient being monitored and Timeout performed Patient Re-evaluated:Patient Re-evaluated prior to inductionOxygen Delivery Method: Nasal cannula Intubation Type: IV induction Placement Confirmation: positive ETCO2

## 2017-01-16 NOTE — Anesthesia Post-op Follow-up Note (Cosign Needed)
Anesthesia QCDR form completed.        

## 2017-01-16 NOTE — Discharge Instructions (Addendum)
Eye Surgery Discharge Instructions  Expect mild scratchy sensation or mild soreness. DO NOT RUB YOUR EYE!  The day of surgery:  Minimal physical activity, but bed rest is not required  No reading, computer work, or close hand work  No bending, lifting, or straining.  May watch TV  For 24 hours:  No driving, legal decisions, or alcoholic beverages  Safety precautions  Eat anything you prefer: It is better to start with liquids, then soup then solid foods.  Eye patch should be worn until postoperative exam tomorrow.  Resume all regular medications including aspirin or Coumadin if these were discontinued prior to surgery. You may shower, bathe, shave, or wash your hair. Tylenol may be taken for mild discomfort.  Call your doctor if you experience significant pain, nausea, or vomiting, fever > 101 or other signs of infection. [phone] or [phone] Specific instructions:  Follow-up Information    Clint Biello, MD Follow up on 01/17/2017.   Specialty:  Ophthalmology Why:  10:40 am Contact information: [address]   [place] [address] [phone]

## 2017-05-28 ENCOUNTER — Other Ambulatory Visit: Payer: Self-pay | Admitting: Internal Medicine

## 2017-05-28 NOTE — Telephone Encounter (Signed)
Please Advise on refill request  Last filled 10/31/16 QTY 60 RF 0 Last OV 12/03/16

## 2017-05-29 NOTE — Telephone Encounter (Signed)
Rx was called into pharmacy. Nothing further is needed

## 2017-05-29 NOTE — Telephone Encounter (Signed)
Approved: #[id number] x [phone] 

## 2017-06-22 ENCOUNTER — Emergency Department: Payer: Medicare Other

## 2017-06-22 ENCOUNTER — Emergency Department
Admission: EM | Admit: 2017-06-22 | Discharge: 2017-06-22 | Disposition: A | Discharge status: Home / Self Care | Payer: Medicare Other | Attending: Emergency Medicine | Admitting: Emergency Medicine

## 2017-06-22 ENCOUNTER — Encounter: Payer: Self-pay | Admitting: Emergency Medicine

## 2017-06-22 DIAGNOSIS — Z950 Presence of cardiac pacemaker: Secondary | ICD-10-CM | POA: Insufficient documentation

## 2017-06-22 DIAGNOSIS — I4891 Unspecified atrial fibrillation: Secondary | ICD-10-CM | POA: Insufficient documentation

## 2017-06-22 DIAGNOSIS — K5700 Diverticulitis of small intestine with perforation and abscess without bleeding: Secondary | ICD-10-CM | POA: Diagnosis not present

## 2017-06-22 DIAGNOSIS — I10 Essential (primary) hypertension: Secondary | ICD-10-CM | POA: Insufficient documentation

## 2017-06-22 DIAGNOSIS — Z7901 Long term (current) use of anticoagulants: Secondary | ICD-10-CM | POA: Insufficient documentation

## 2017-06-22 DIAGNOSIS — R300 Dysuria: Secondary | ICD-10-CM | POA: Diagnosis not present

## 2017-06-22 DIAGNOSIS — Z87891 Personal history of nicotine dependence: Secondary | ICD-10-CM | POA: Diagnosis not present

## 2017-06-22 DIAGNOSIS — I4892 Unspecified atrial flutter: Secondary | ICD-10-CM | POA: Insufficient documentation

## 2017-06-22 DIAGNOSIS — R1030 Lower abdominal pain, unspecified: Secondary | ICD-10-CM | POA: Insufficient documentation

## 2017-06-22 DIAGNOSIS — K5732 Diverticulitis of large intestine without perforation or abscess without bleeding: Secondary | ICD-10-CM | POA: Diagnosis not present

## 2017-06-22 DIAGNOSIS — R509 Fever, unspecified: Secondary | ICD-10-CM | POA: Insufficient documentation

## 2017-06-22 DIAGNOSIS — Z79899 Other long term (current) drug therapy: Secondary | ICD-10-CM | POA: Insufficient documentation

## 2017-06-22 DIAGNOSIS — K5792 Diverticulitis of intestine, part unspecified, without perforation or abscess without bleeding: Secondary | ICD-10-CM

## 2017-06-22 LAB — CBC WITH DIFFERENTIAL/PLATELET
Basophils Absolute: 0 10*3/uL (ref 0–0.1)
Basophils Relative: 0 %
Eosinophils Absolute: 0 10*3/uL (ref 0–0.7)
Eosinophils Relative: 0 %
HEMATOCRIT: 42.8 % (ref 40.0–52.0)
HEMOGLOBIN: 15.3 g/dL (ref 13.0–18.0)
LYMPHS ABS: 1 10*3/uL (ref 1.0–3.6)
Lymphocytes Relative: 8 %
MCH: 32.7 pg (ref 26.0–34.0)
MCHC: 35.6 g/dL (ref 32.0–36.0)
MCV: 91.9 fL (ref 80.0–100.0)
MONOS PCT: 10 %
Monocytes Absolute: 1.2 10*3/uL — ABNORMAL HIGH (ref 0.2–1.0)
NEUTROS PCT: 82 %
Neutro Abs: 9.7 10*3/uL — ABNORMAL HIGH (ref 1.4–6.5)
Platelets: 205 10*3/uL (ref 150–440)
RBC: 4.66 MIL/uL (ref 4.40–5.90)
RDW: 13.3 % (ref 11.5–14.5)
WBC: 11.9 10*3/uL — ABNORMAL HIGH (ref 3.8–10.6)

## 2017-06-22 LAB — PROTIME-INR
INR: 1.42
PROTHROMBIN TIME: 17.2 s — AB (ref 11.4–15.2)

## 2017-06-22 LAB — COMPREHENSIVE METABOLIC PANEL
ALT: 11 U/L — AB (ref 17–63)
AST: 19 U/L (ref 15–41)
Albumin: 3.9 g/dL (ref 3.5–5.0)
Alkaline Phosphatase: 59 U/L (ref 38–126)
Anion gap: 10 (ref 5–15)
BUN: 14 mg/dL (ref 6–20)
CHLORIDE: 103 mmol/L (ref 101–111)
CO2: 24 mmol/L (ref 22–32)
CREATININE: 1.03 mg/dL (ref 0.61–1.24)
Calcium: 8.8 mg/dL — ABNORMAL LOW (ref 8.9–10.3)
GFR calc non Af Amer: >60 mL/min (ref >60)
Glucose, Bld: 142 mg/dL — ABNORMAL HIGH (ref 65–99)
Potassium: 4.1 mmol/L (ref 3.5–5.1)
SODIUM: 137 mmol/L (ref 135–145)
Total Bilirubin: 1.4 mg/dL — ABNORMAL HIGH (ref 0.3–1.2)
Total Protein: 7.9 g/dL (ref 6.5–8.1)

## 2017-06-22 LAB — URINALYSIS, COMPLETE (UACMP) WITH MICROSCOPIC
BACTERIA UA: NONE SEEN
Bilirubin Urine: NEGATIVE
Glucose, UA: NEGATIVE mg/dL
HGB URINE DIPSTICK: NEGATIVE
Ketones, ur: NEGATIVE mg/dL
Leukocytes, UA: NEGATIVE
Nitrite: NEGATIVE
Protein, ur: 30 mg/dL — AB
SQUAMOUS EPITHELIAL / LPF: NONE SEEN
Specific Gravity, Urine: 1.021 (ref 1.005–1.030)
pH: 5 (ref 5.0–8.0)

## 2017-06-22 MED ORDER — IOPAMIDOL (ISOVUE-300) INJECTION 61%
30.0000 mL | Freq: Once | INTRAVENOUS | Status: AC | PRN
  Administered 2017-06-22: 30 mL via ORAL

## 2017-06-22 MED ORDER — IOPAMIDOL (ISOVUE-300) INJECTION 61%
100.0000 mL | Freq: Once | INTRAVENOUS | Status: AC | PRN
  Administered 2017-06-22: 100 mL via INTRAVENOUS

## 2017-06-22 MED ORDER — SODIUM CHLORIDE 0.9 % IV BOLUS (SEPSIS)
500.0000 mL | INTRAVENOUS | Status: AC
  Administered 2017-06-22: 500 mL via INTRAVENOUS

## 2017-06-22 MED ORDER — CIPROFLOXACIN HCL 500 MG PO TABS: 500.0000 mg | ORAL_TABLET | Freq: Two times a day (BID) | ORAL | 0 refills | Status: AC

## 2017-06-22 MED ORDER — METRONIDAZOLE 500 MG PO TABS: 500.0000 mg | ORAL_TABLET | Freq: Three times a day (TID) | ORAL | 0 refills | Status: AC

## 2017-06-22 MED ORDER — METRONIDAZOLE 500 MG PO TABS
500.0000 mg | ORAL_TABLET | Freq: Once | ORAL | Status: AC
  Administered 2017-06-22: 500 mg via ORAL
  Filled 2017-06-22: qty 1

## 2017-06-22 MED ORDER — CIPROFLOXACIN HCL 500 MG PO TABS
500.0000 mg | ORAL_TABLET | Freq: Once | ORAL | Status: AC
  Administered 2017-06-22: 500 mg via ORAL
  Filled 2017-06-22: qty 1

## 2017-06-22 NOTE — ED Notes (Signed)
CT left oral contrast at bedside 

## 2017-06-22 NOTE — ED Provider Notes (Signed)
[facility] Emergency Department Provider Note  ____________________________________________   First MD Initiated Contact with Patient 06/22/17 [phone]     (approximate)  I have reviewed the triage vital signs and the nursing notes.   HISTORY  Chief Complaint Dysuria and Fever    HPI [name] is a 83 y.o. male with medical history as listed below her presents for evaluation of several days of gradually worsening mild burning dysuria, increased urinary frequency, feeling like he cannot completely empty his bladder, and some lower pelvic pain.  He is in no acute distress at this time but states it comes and goes.  He woke up a couple of hours prior to arrival transient sweat and felt febrile although he was afebrile at triage.  He denies chest pain, shortness of breath, nausea, vomiting, and upper abdominal pain.  He has pain primarily in the suprapubic region and it is intermittent and a dull aching pain.  He reports burning when he urinates.  He has not seen any blood in his urine.  He has had no flank pain and has no history of kidney stones.  He is on Eliquis and flecainide for his heart.  Nothing in particular makes the patient's symptoms better nor worse.     Past Medical History:  Diagnosis Date  . Acute diverticulitis 12/15   [facility]  . Acute diverticulitis   . Allergic rhinitis   . Atrial fibrillation or flutter 12/10   Has been paroxysmal. Patient has not tolerated coumadin due to headaches. ECHO (12/10) Mild LVH, EF 55%,   . BPH (benign prostatic hyperplasia)   . Chest pain 12/10   Lexiscan myoview with EF 55%, no ischemia or infarction  . Dysrhythmia    AFIB  . HOH (hard of hearing)   . Hyperlipemia   . Hypertension   . Osteoarthritis   . Pacemaker  medtronic 02/06/2010   Qualifier: Diagnosis of  By: Lovena Le, MD, [facility], Binnie Kand   . Presence of permanent cardiac pacemaker   . Sick sinus syndrome ([facility])    S/P dual chamber Medtronic PCM     Patient Active Problem List   Diagnosis Date Noted  . Mood disorder ([place]) 12/03/2016  . Chronic constipation 01/10/2015  . Counseling regarding advanced directives 04/23/2014  . Routine general medical examination at a health care facility 04/23/2014  . Osteoarthritis of left shoulder 10/21/2013  . GERD (gastroesophageal reflux disease) 02/09/2013  . ACTINIC KERATOSIS 05/04/2010  . Pacemaker  medtronic 02/06/2010  . Sick sinus syndrome ([place]) 10/27/2009  . Atrial fibrillation ([place]) 10/06/2009  . HEPATIC CYST 10/06/2009  . Hyperlipemia 07/12/2009  . Essential hypertension, benign 07/12/2009  . ALLERGIC RHINITIS 07/12/2009  . BPH (benign prostatic hypertrophy) 07/12/2009  . OSTEOARTHRITIS 07/12/2009    Past Surgical History:  Procedure Laterality Date  . CATARACT EXTRACTION W/PHACO Left 01/16/2017   Procedure: CATARACT EXTRACTION PHACO AND INTRAOCULAR LENS PLACEMENT (IOC);  Surgeon: Estill Cotta, MD;  Location: [facility] ORS;  Service: Ophthalmology;  Laterality: Left;  [place] 1:51.3 AP% 25.9 CDE 54.80 FLUID PACK LOT # [id number] H  . dual chamber pacemaker  01/11   Dr. Lovena Le  . LAPAROSCOPIC CHOLECYSTECTOMY  1/14   Dr Bary Castilla  . VASECTOMY  in his 40's    Prior to Admission medications   Medication Sig Start Date End Date Taking? Authorizing Provider  acetaminophen (TYLENOL) 325 MG tablet Take 650 mg by mouth every 6 (six) hours as needed. Reported on 01/17/2016    [provider]  ALPRAZolam (XANAX) 0.25 MG tablet TAKE 1 TABLET BY MOUTH TWICE DAILY AS NEEDED FOR ANXIETY 05/29/17   [name], MD  apixaban (ELIQUIS) 2.5 MG TABS tablet Take 2.5 mg by mouth 2 (two) times daily.    [provider]  ciprofloxacin (CIPRO) 500 MG tablet Take 1 tablet (500 mg total) by mouth 2 (two) times daily. 06/22/17 07/02/17  Schaevitz, Randall An, MD  diltiazem (TIAZAC) 180 MG 24 hr capsule Take 180 mg by mouth daily.    [provider]  flecainide (TAMBOCOR) 100 MG  tablet Take 1 tablet (100 mg total) by mouth 2 (two) times daily. 12/13/14   Deboraha Sprang, MD  Glucosamine 500 MG CAPS Take by mouth daily.      [provider]  metroNIDAZOLE (FLAGYL) 500 MG tablet Take 1 tablet (500 mg total) by mouth 3 (three) times daily. 06/22/17 07/02/17  Orbie Pyo, MD  tamsulosin (FLOMAX) 0.4 MG CAPS capsule TAKE ONE CAPSULE BY MOUTH EVERY DAY 08/16/14   [name], MD    Allergies Patient has no known allergies.  Family History  Problem Relation Age of Onset  . Hypertension Mother   . Alcohol abuse Father   . Bladder Cancer Neg Hx   . Prostate cancer Neg Hx   . Kidney cancer Neg Hx     Social History Social History  Substance Use Topics  . Smoking status: Former Research scientist (life sciences)  . Smokeless tobacco: Never Used     Comment: In [employer], quit in 1960's  . Alcohol use No    Review of Systems Constitutional: Subjective fever Eyes: No visual changes. ENT: No sore throat. Cardiovascular: Denies chest pain. Respiratory: Denies shortness of breath. Gastrointestinal: Suprapubic pain.  No nausea, no vomiting.  No diarrhea.  No constipation. Genitourinary: burning dysuria, increased urinary frequency, sensation that he is not emptying his bladder Musculoskeletal: Negative for neck pain.  Negative for back pain. Integumentary: Negative for rash. Neurological: Negative for headaches, focal weakness or numbness.   ____________________________________________   PHYSICAL EXAM:  VITAL SIGNS: ED Triage Vitals  Enc Vitals Group     BP 06/22/17 0439 119/79     Pulse Rate 06/22/17 0439 98     Resp 06/22/17 0439 19     Temp 06/22/17 0439 98.8 F (37.1 C)     Temp Source 06/22/17 0439 Oral     SpO2 06/22/17 0439 96 %     Weight 06/22/17 0435 77.1 kg (170 lb)     Height 06/22/17 0435 1.803 m (5\' 11" )     Head Circumference --      Peak Flow --      Pain Score 06/22/17 0433 6     Pain Loc --      Pain Edu? --      Excl. in [place]? --      Constitutional: Alert and oriented. Generally well appearing and in no acute distress. Eyes: Conjunctivae are normal.  Cardiovascular: Mild tachycardia, regular rhythm. Good peripheral circulation. Grossly normal heart sounds. Respiratory: Normal respiratory effort.  No retractions. Lungs CTAB. Gastrointestinal: Soft and nontender even in the suprapubic region. No distention.  Musculoskeletal: No lower extremity tenderness nor edema. No gross deformities of extremities. Neurologic:  Normal speech and language. No gross focal neurologic deficits are appreciated.  Skin:  Skin is warm, dry and intact. No rash noted. Psychiatric: Mood and affect are normal. Speech and behavior are normal.  ____________________________________________   LABS (all labs ordered are listed, but only  abnormal results are displayed)  Labs Reviewed  URINALYSIS, COMPLETE (UACMP) WITH MICROSCOPIC - Abnormal; Notable for the following:       Result Value   Color, Urine YELLOW (*)    APPearance CLEAR (*)    Protein, ur 30 (*)    All other components within normal limits  COMPREHENSIVE METABOLIC PANEL - Abnormal; Notable for the following:    Glucose, Bld 142 (*)    Calcium 8.8 (*)    ALT 11 (*)    Total Bilirubin 1.4 (*)    All other components within normal limits  CBC WITH DIFFERENTIAL/PLATELET - Abnormal; Notable for the following:    WBC 11.9 (*)    Neutro Abs 9.7 (*)    Monocytes Absolute 1.2 (*)    All other components within normal limits  PROTIME-INR - Abnormal; Notable for the following:    Prothrombin Time 17.2 (*)    All other components within normal limits  CULTURE, BLOOD (ROUTINE X 2)  CULTURE, BLOOD (ROUTINE X 2)  URINE CULTURE   ____________________________________________  EKG  None - EKG not ordered by ED physician ____________________________________________  RADIOLOGY   CT abd/pelvis is pending  ____________________________________________   PROCEDURES  Critical  Care performed: No   Procedure(s) performed:   Procedures   ____________________________________________   INITIAL IMPRESSION / ASSESSMENT AND PLAN / ED COURSE  Pertinent labs & imaging results that were available during my care of the patient were reviewed by me and considered in my medical decision making (see chart for details).  differential diagnoses includes all the usual causes of male lower abdominal pain, but given his history of present illness and physical exam I am reassured that it is unlikely he has an acute intra-abdominal infection such as diverticulitis given the has no tenderness to palpation.  His signs and symptoms are most consistent with a urinary tract infection.  He has no signs or symptoms of pyelonephritis at this time.  Kidney stones must be considered in the differential given the concern of having an infected/impacted stone, but at this point he has only the dysuria, increased frequency, and the sensation of not being able to empty his bladder, but on bladder scan he had only 59 mL measured.  Given his age we will evaluate broadly with lab work and I will have a low threshold for ordering imaging, but at this point the answer may be found in the urinalysis.  I will give him a small fluid bolus and encouraged him that he can drink.  Will reassess after results are available.  Patient and family understand and agree with plan.   Clinical Course as of Jun 22 1558  Sat Jun 22, 2017  8101 Urinalysis is unremarkable, which is surprising given the HPI.  Given the mild leukocytosis and lower (suprapubic) abdominal pain (and history of diverticulitis), I will order a CT abd/pelvis with PO & IV contrast for thorough evaluation for any intraabdominal pathology.  Patient understands and agrees with the plan. No peritonitis.  [CF]  7510 Transferring ED care to Dr. Clearnce Hasten for reassessment and disposition when appropriate.   [CF]    Clinical Course User Index [CF] Hinda Kehr, MD    ____________________________________________  FINAL CLINICAL IMPRESSION(S) / ED DIAGNOSES  Suprapubic Abdominal Pain Dysuria   MEDICATIONS GIVEN DURING THIS VISIT:  Medications  sodium chloride 0.9 % bolus 500 mL (0 mLs Intravenous Stopped 06/22/17 0642)  iopamidol (ISOVUE-300) 61 % injection 30 mL (30 mLs Oral Contrast Given 06/22/17 0654)  iopamidol (ISOVUE-300) 61 % injection 100 mL (100 mLs Intravenous Contrast Given 06/22/17 0800)           Note:  This document was prepared using Dragon voice recognition software and may include unintentional dictation errors.    Hinda Kehr, MD 06/22/17 [phone]

## 2017-06-22 NOTE — ED Provider Notes (Signed)
signout from Dr. Karma Greaser in this 83 year old male with dysuria. Plan is to follow up with his CT scan. Urine was clear and there is concern for diverticulitis.  Physical Exam  BP 121/76 (BP Location: Right Arm)   Pulse 94   Temp 98.4 F (36.9 C) (Oral)   Resp 18   Ht 5\' 11"  (1.803 m)   Wt 77.1 kg (170 lb)   SpO2 97%   BMI 23.71 kg/m  ----------------------------------------- 9:03 AM on 06/22/2017 -----------------------------------------   Physical Exam Patient without any distress at this time. Abdomen is soft and nontender. ED Course  Procedures  MDM Patient says that his pain is improved. We discussed the diagnosis of diverticulitis the need for antibiotic treatment. He says that he has had diverticulitis in the past. We also discussed return precautions including any worsening in his symptoms, especially abdominal pain. He and his family knows that he must return to the emergency department immediately for any worsening or concerning symptoms.he is understanding of the plan and willing to comply.       Orbie Pyo, MD 06/22/17 [phone]

## 2017-06-22 NOTE — ED Notes (Signed)
ED Provider at bedside. 

## 2017-06-22 NOTE — ED Triage Notes (Signed)
Pt arrives POV to triage with c/o fever and dysuria. Pt states that he has not been able to fully urinate for 2-3 days and is having lower pelvic pain. Pt is in NAD at this time.

## 2017-06-22 NOTE — ED Notes (Signed)
Pt ambulatory to triage w/ unsteady gait, wheelchair provided for pt.  Family member states possible fever but has not been able to check temperature at home.  Also reports unable to urinate.

## 2017-06-22 NOTE — ED Notes (Signed)
Pt had Bladder Scan: 91mL

## 2017-06-23 LAB — URINE CULTURE

## 2017-06-27 LAB — CULTURE, BLOOD (ROUTINE X 2)
CULTURE: NO GROWTH
CULTURE: NO GROWTH
Special Requests: ADEQUATE

## 2017-06-28 ENCOUNTER — Telehealth: Payer: Self-pay | Admitting: Internal Medicine

## 2017-06-28 ENCOUNTER — Encounter: Payer: Self-pay | Admitting: Family Medicine

## 2017-06-28 ENCOUNTER — Ambulatory Visit (INDEPENDENT_AMBULATORY_CARE_PROVIDER_SITE_OTHER): Payer: Medicare Other | Admitting: Family Medicine

## 2017-06-28 VITALS — BP 134/82 | HR 80 | Temp 98.3°F | Ht 70.5 in | Wt 168.0 lb

## 2017-06-28 DIAGNOSIS — K5792 Diverticulitis of intestine, part unspecified, without perforation or abscess without bleeding: Secondary | ICD-10-CM | POA: Diagnosis not present

## 2017-06-28 DIAGNOSIS — R8299 Other abnormal findings in urine: Secondary | ICD-10-CM | POA: Diagnosis not present

## 2017-06-28 DIAGNOSIS — R82998 Other abnormal findings in urine: Secondary | ICD-10-CM

## 2017-06-28 LAB — POCT URINALYSIS DIPSTICK
Bilirubin, UA: NEGATIVE
GLUCOSE UA: NEGATIVE
KETONES UA: NEGATIVE
Leukocytes, UA: NEGATIVE
Nitrite, UA: NEGATIVE
Protein, UA: NEGATIVE
RBC UA: NEGATIVE
Urobilinogen, UA: 0.2 E.U./dL
pH, UA: 6 (ref 5.0–8.0)

## 2017-06-28 NOTE — Telephone Encounter (Signed)
Patient Name: [name] DOB: 1932/12/31 Initial Comment Caller states, was in hospital last week with severe abd pains, Dx with diverticulitis. Now he has blood in urine. Nurse Assessment Nurse: Juleen China RN, Butch Penny Date/Time Eilene Ghazi Time): 06/28/2017 9:29:34 AM Confirm and document reason for call. If symptomatic, describe symptoms. ---Caller states, was in ER last week with severe abd pains Dx with diverticulitis. Prescribed antibiotics. Now he has blood in urine. No blood clots. No burning. Abdominal pain is now 4/10. No fever. Does the patient have any new or worsening symptoms? ---Yes Will a triage be completed? ---Yes Related visit to physician within the last 2 weeks? ---Yes Does the PT have any chronic conditions? (i.e. diabetes, asthma, etc.) ---Yes List chronic conditions. ---High Blood Pressure Pacemaker for A Fib. Is this a behavioral health or substance abuse call? ---No Guidelines Guideline Title Affirmed Question Affirmed Notes Urine - Blood In Taking Coumadin (warfarin) or other strong blood thinner, or known bleeding disorder (e.g., thrombocytopenia) Final Disposition User See Physician within 4 Hours (or PCP triage) Juleen China, RN, Butch Penny Comments Appt scheduled for today at 2:45 with Dr. Diona Browner Referrals REFERRED TO PCP OFFICE Caller Disagree/Comply Comply Caller Understands Yes PreDisposition Call Doctor

## 2017-06-28 NOTE — Progress Notes (Signed)
   Subjective:    Patient ID: [name], male    DOB: 1933/02/11, 83 y.o.   MRN: [id number]  HPI   83 year old male pt with afib on  Eliquis 2.5 mg tabs  BID anticoagulation presents with blood in urine. He reports  Dark urine x 4-5 days  UA in office clear today.   See in ED on 06/22/2017  Dx with diverticulitis.  He had been having abdominal pain  1 week. Bilateral low abd pain. Cramping, mild constipation. Armstrong discharge at rectum.  Started on cipro and metronidazole  on day 6/10.  Has improved some from 10/10 pain scale to 5/10.  Good po intake, good water intake No pain with going over bumps in care.      CT abd: IMPRESSION: 1. Moderate non complicated proximal descending diverticulitis. 2. Small volume cul-de-sac fluid, likely secondary. 3.  Aortic Atherosclerosis (ICD10-I70.0). 4. Prostatomegaly   He is using miralax for constipation.. But has not had BM in 2-3 days. Review of Systems  Constitutional: Negative for fatigue and fever.  HENT: Negative for ear pain.   Eyes: Negative for pain.  Respiratory: Negative for cough and shortness of breath.   Cardiovascular: Negative for chest pain, palpitations and leg swelling.  Gastrointestinal: Positive for abdominal pain and constipation.  Genitourinary: Negative for dysuria.  Musculoskeletal: Negative for arthralgias.  Neurological: Negative for syncope, light-headedness and headaches.  Psychiatric/Behavioral: Negative for dysphoric mood.       Objective:   Physical Exam  Constitutional: Vital signs are normal. He appears well-developed and well-nourished.  HENT:  Head: Normocephalic.  Right Ear: Hearing normal.  Left Ear: Hearing normal.  Nose: Nose normal.  Mouth/Throat: Oropharynx is clear and moist and mucous membranes are normal.  Neck: Trachea normal. Carotid bruit is not present. No thyroid mass and no thyromegaly present.  Cardiovascular: Normal rate, regular rhythm and normal pulses.  Exam reveals no  gallop, no distant heart sounds and no friction rub.   No murmur heard. No peripheral edema  Pulmonary/Chest: Effort normal and breath sounds normal. No respiratory distress.  Abdominal: There is no hepatosplenomegaly. There is tenderness in the right lower quadrant and left lower quadrant. There is no rigidity, no rebound, no guarding and no CVA tenderness.  Skin: Skin is warm, dry and intact. No rash noted.  Psychiatric: He has a normal mood and affect. His speech is normal and behavior is normal. Thought content normal.          Assessment & Plan:

## 2017-06-28 NOTE — Patient Instructions (Addendum)
Increase water intake some. Complete antibiotic course. Add colace to miralax daily to soften stool. Follow up with PCP if abdominal pain not resolved at end of course of antibiotics.

## 2017-06-28 NOTE — Telephone Encounter (Signed)
He needs to be seen. If no appts, UC

## 2017-06-28 NOTE — Telephone Encounter (Signed)
Spoke to pt. He has an appt with Dr Diona Browner this afternoon

## 2017-07-07 DIAGNOSIS — R82998 Other abnormal findings in urine: Secondary | ICD-10-CM | POA: Insufficient documentation

## 2017-07-07 NOTE — Assessment & Plan Note (Signed)
No blood or infection seen in urine today. Possibly resolved bleeding or was dark due to dehydration/ med SE.

## 2017-07-07 NOTE — Assessment & Plan Note (Signed)
IMproving symptoms, less abdominal pain, no further fever. No indications for hospitalization Increase water intake some. Complete antibiotic course. Some symptoms may be from constipation. Add colace to miralax daily to soften stool. Follow up with PCP if abdominal pain not resolved at end of course of antibiotics.

## 2017-09-13 ENCOUNTER — Ambulatory Visit (INDEPENDENT_AMBULATORY_CARE_PROVIDER_SITE_OTHER): Payer: Medicare Other | Admitting: Internal Medicine

## 2017-09-13 ENCOUNTER — Encounter: Payer: Self-pay | Admitting: Internal Medicine

## 2017-09-13 ENCOUNTER — Ambulatory Visit: Payer: Self-pay

## 2017-09-13 VITALS — BP 140/70 | HR 69 | Temp 98.4°F | Wt 175.0 lb

## 2017-09-13 DIAGNOSIS — R1010 Upper abdominal pain, unspecified: Secondary | ICD-10-CM | POA: Insufficient documentation

## 2017-09-13 DIAGNOSIS — Z23 Encounter for immunization: Secondary | ICD-10-CM

## 2017-09-13 DIAGNOSIS — R1030 Lower abdominal pain, unspecified: Secondary | ICD-10-CM

## 2017-09-13 DIAGNOSIS — N401 Enlarged prostate with lower urinary tract symptoms: Secondary | ICD-10-CM

## 2017-09-13 DIAGNOSIS — N138 Other obstructive and reflux uropathy: Secondary | ICD-10-CM

## 2017-09-13 MED ORDER — FINASTERIDE 5 MG PO TABS: 5.0000 mg | ORAL_TABLET | Freq: Every day | ORAL | 3 refills | Status: DC

## 2017-09-13 NOTE — Telephone Encounter (Signed)
Pt has appt with Dr Silvio Pate today at 12:45.

## 2017-09-13 NOTE — Telephone Encounter (Signed)
.   Pain is 7-8. Cramping pain. [name] states he has had this pain before this past summer. Went to ED and placed on antibiotics and cleared it up. [name] states this feels like something different. [name] states it constant but not always as intense. [name] does not know what is causing the abdominal pain. No vomiting, fever, chest pain, shortness of breath. Feels achy all over. [name] also having hoarseness and upper respiratory symptoms. Nothing makes it better, gets worse with eating. No vomiting. Is having watery stools (clear colored water). No dysuria. [name] wants to be seen by Dr Silvio Pate. No appts at [facility] and [name] ok'd NT to look at [place] but no appts. Offered to look at the other [facility] and [facility] but [name] wants to see [place]. [name] states if unable to be seen will go to local urgent care. [name] to be worked in with Dr Silvio Pate today at 1245 per [facility].  Reason for Disposition . [1] MILD-MODERATE pain AND [2] constant AND [3] present > 2 hours  Answer Assessment - Initial Assessment Questions 1. LOCATION: "Where does it hurt?"      Across lower part of stomach 2. RADIATION: "Does the pain shoot anywhere else?" (e.g., chest, back)     Shoots through to the back or kidney area 3. ONSET: "When did the pain begin?" (Minutes, hours or days ago)      2 days  4. SUDDEN: "Gradual or sudden onset?"     Gradual  5. PATTERN "Does the pain come and go, or is it constant?"    - If constant: "Is it getting better, staying the same, or worsening?"      (Note: Constant means the pain never goes away completely; most serious pain is constant and it progresses)     - If intermittent: "How long does it last?" "Do you have pain now?"     (Note: Intermittent means the pain goes away completely between bouts)     Now pain is constant  6. SEVERITY: "How bad is the pain?"  (e.g., Scale 1-10; mild, moderate, or severe)    - MILD (1-3): doesn't interfere with normal activities, abdomen soft and not tender to touch     - MODERATE (4-7):  interferes with normal activities or awakens from sleep, tender to touch     - SEVERE (8-10): excruciating pain, doubled over, unable to do any normal activities       *No Answer* 7. RECURRENT SYMPTOM: "Have you ever had this type of abdominal pain before?" If so, ask: "When was the last time?" and "What happened that time?"      *No Answer* 8. CAUSE: "What do you think is causing the abdominal pain?"     *No Answer* 9. RELIEVING/AGGRAVATING FACTORS: "What makes it better or worse?" (e.g., movement, antacids, bowel movement)     *No Answer* 10. OTHER SYMPTOMS: "Has there been any vomiting, diarrhea, constipation, or urine problems?"       *No Answer*  Protocols used: ABDOMINAL PAIN - MALE-A-AH

## 2017-09-13 NOTE — Progress Notes (Signed)
Subjective:    Patient ID: [name], male    DOB: 07-Jul-1933, 83 y.o.   MRN: [id number]  HPI Here due to abdominal pain  This is different from past pain Having cramping pain in lower abdomen now--started last night Now feeling better  No N/V No fever Appetite is okay--but caused the pain to get worse Having liquid clear diarrhea--3-4 times since last night Pain seemed to improve after moving bowels  Current Outpatient Medications on File Prior to Visit  Medication Sig Dispense Refill  . acetaminophen (TYLENOL) 325 MG tablet Take 650 mg by mouth every 6 (six) hours as needed. Reported on 01/17/2016    . ALPRAZolam (XANAX) 0.25 MG tablet TAKE 1 TABLET BY MOUTH TWICE DAILY AS NEEDED FOR ANXIETY 60 tablet 0  . apixaban (ELIQUIS) 2.5 MG TABS tablet Take 2.5 mg by mouth 2 (two) times daily.    [name] diltiazem (TIAZAC) 180 MG 24 hr capsule Take 180 mg by mouth daily.    . flecainide (TAMBOCOR) 100 MG tablet Take 1 tablet (100 mg total) by mouth 2 (two) times daily. 180 tablet 3  . Glucosamine 500 MG CAPS Take by mouth daily.      . tamsulosin (FLOMAX) 0.4 MG CAPS capsule TAKE ONE CAPSULE BY MOUTH EVERY DAY 30 capsule 11   No current facility-administered medications on file prior to visit.     No Known Allergies  Past Medical History:  Diagnosis Date  . Acute diverticulitis 12/15   [facility]  . Acute diverticulitis   . Allergic rhinitis   . Atrial fibrillation or flutter 12/10   Has been paroxysmal. Patient has not tolerated coumadin due to headaches. ECHO (12/10) Mild LVH, EF 55%,   . BPH (benign prostatic hyperplasia)   . Chest pain 12/10   Lexiscan myoview with EF 55%, no ischemia or infarction  . Dysrhythmia    AFIB  . HOH (hard of hearing)   . Hyperlipemia   . Hypertension   . Osteoarthritis   . Pacemaker  medtronic 02/06/2010   Qualifier: Diagnosis of  By: Lovena Le, MD, [facility], Binnie Kand   . Presence of permanent cardiac pacemaker   . Sick sinus syndrome (HCC)    S/P  dual chamber Medtronic PCM    Past Surgical History:  Procedure Laterality Date  . CATARACT EXTRACTION W/PHACO Left 01/16/2017   Procedure: CATARACT EXTRACTION PHACO AND INTRAOCULAR LENS PLACEMENT (IOC);  Surgeon: Estill Cotta, MD;  Location: [facility] ORS;  Service: Ophthalmology;  Laterality: Left;  [place] 1:51.3 AP% 25.9 CDE 54.80 FLUID PACK LOT # [id number] H  . dual chamber pacemaker  01/11   Dr. Lovena Le  . LAPAROSCOPIC CHOLECYSTECTOMY  1/14   Dr Bary Castilla  . VASECTOMY  in his 79's    Family History  Problem Relation Age of Onset  . Hypertension Mother   . Alcohol abuse Father   . Bladder Cancer Neg Hx   . Prostate cancer Neg Hx   . Kidney cancer Neg Hx     Social History   Socioeconomic History  . Marital status: Married    Spouse name: Not on file  . Number of children: 3  . Years of education: Not on file  . Highest education level: Not on file  Social Needs  . Financial resource strain: Not on file  . Food insecurity - worry: Not on file  . Food insecurity - inability: Not on file  . Transportation needs - medical: Not on file  . Transportation needs - non-medical:  Not on file  Occupational History  . Occupation: Retired    Fish farm manager: RETIRED    Comment: Hydrologist  Tobacco Use  . Smoking status: Former Research scientist (life sciences)  . Smokeless tobacco: Never Used  . Tobacco comment: In [place], quit in 1960's  Substance and Sexual Activity  . Alcohol use: No  . Drug use: No  . Sexual activity: Yes  Other Topics Concern  . Not on file  Social History Narrative   2 Daughters, one son killed in [place]      Has living will   Wife, then daughter Maudie Mercury, is health care POA   Would accept resuscitation attempts but no prolonged life support.   No tube feeding if cognitively unaware   Review of Systems No suspect foods No one else with symptoms Some cough but no SOB. Relates to current cold Taking claritin D---discussed that doesn't help colds really Has some urinary  leakage--wearing pad No hematuria or dysuria now    Objective:   Physical Exam  Constitutional: He appears well-developed. No distress.  Neck: No thyromegaly present.  Pulmonary/Chest: Effort normal and breath sounds normal. No respiratory distress. He has no wheezes. He has no rales.  Abdominal: Soft. Bowel sounds are normal. He exhibits no distension and no mass. There is no tenderness. There is no rebound and no guarding.  Lymphadenopathy:    He has no cervical adenopathy.          Assessment & Plan:

## 2017-09-13 NOTE — Telephone Encounter (Signed)
Will evaluate at the OV 

## 2017-09-13 NOTE — Assessment & Plan Note (Signed)
Associated with liquid diarrhea Symptoms not consistent with acute diverticulitis No signs of UTI  Discussed observation only If worsens with pain, fever, etc--would augmentin

## 2017-09-13 NOTE — Assessment & Plan Note (Signed)
Now with leakage and doing worse Discussed avoiding decongestants Will add finasteride

## 2017-10-12 DIAGNOSIS — N3000 Acute cystitis without hematuria: Secondary | ICD-10-CM | POA: Diagnosis not present

## 2017-10-12 DIAGNOSIS — R300 Dysuria: Secondary | ICD-10-CM | POA: Diagnosis not present

## 2017-10-17 ENCOUNTER — Ambulatory Visit (INDEPENDENT_AMBULATORY_CARE_PROVIDER_SITE_OTHER): Payer: Medicare Other | Admitting: Internal Medicine

## 2017-10-17 ENCOUNTER — Encounter: Payer: Self-pay | Admitting: Internal Medicine

## 2017-10-17 VITALS — BP 134/80 | HR 79 | Temp 97.7°F | Wt 172.0 lb

## 2017-10-17 DIAGNOSIS — R300 Dysuria: Secondary | ICD-10-CM

## 2017-10-17 LAB — POC URINALSYSI DIPSTICK (AUTOMATED)
BILIRUBIN UA: NEGATIVE
Blood, UA: NEGATIVE
Glucose, UA: NEGATIVE
KETONES UA: NEGATIVE
LEUKOCYTES UA: NEGATIVE
Nitrite, UA: NEGATIVE
PH UA: 6 (ref 5.0–8.0)
Spec Grav, UA: >=1.03 — AB (ref 1.010–1.025)
Urobilinogen, UA: 0.2 E.U./dL

## 2017-10-17 NOTE — Addendum Note (Signed)
Addended by: Lurlean Nanny on: 10/17/2017 01:16 PM   Modules accepted: Orders

## 2017-10-17 NOTE — Patient Instructions (Signed)

## 2017-10-17 NOTE — Progress Notes (Signed)
Subjective:    Patient ID: [name], male    DOB: 20-Mar-1933, 84 y.o.   MRN: [id number]  HPI  Pt presents to the clinic today with c/o dysuria. He reports this started 2 weeks ago. He denies urgency or frequency. He went to UC 1/5 for the same. He was given a RX for [employer], which he is taking but he reports no improvement. He has 2 days left.  Review of Systems      Past Medical History:  Diagnosis Date  . Acute diverticulitis 12/15   [facility]  . Acute diverticulitis   . Allergic rhinitis   . Atrial fibrillation or flutter 12/10   Has been paroxysmal. Patient has not tolerated coumadin due to headaches. ECHO (12/10) Mild LVH, EF 55%,   . BPH (benign prostatic hyperplasia)   . Chest pain 12/10   Lexiscan myoview with EF 55%, no ischemia or infarction  . Dysrhythmia    AFIB  . HOH (hard of hearing)   . Hyperlipemia   . Hypertension   . Osteoarthritis   . Pacemaker  medtronic 02/06/2010   Qualifier: Diagnosis of  By: Lovena Le, MD, [facility], Binnie Kand   . Presence of permanent cardiac pacemaker   . Sick sinus syndrome (HCC)    S/P dual chamber Medtronic PCM    Current Outpatient Medications  Medication Sig Dispense Refill  . acetaminophen (TYLENOL) 325 MG tablet Take 650 mg by mouth every 6 (six) hours as needed. Reported on 01/17/2016    . ALPRAZolam (XANAX) 0.25 MG tablet TAKE 1 TABLET BY MOUTH TWICE DAILY AS NEEDED FOR ANXIETY 60 tablet 0  . apixaban (ELIQUIS) 2.5 MG TABS tablet Take 2.5 mg by mouth 2 (two) times daily.    [name] diltiazem (TIAZAC) 180 MG 24 hr capsule Take 180 mg by mouth daily.    . finasteride (PROSCAR) 5 MG tablet Take 1 tablet (5 mg total) by mouth daily. 90 tablet 3  . flecainide (TAMBOCOR) 100 MG tablet Take 1 tablet (100 mg total) by mouth 2 (two) times daily. 180 tablet 3  . Glucosamine 500 MG CAPS Take by mouth daily.      . nitrofurantoin, macrocrystal-monohydrate, (MACROBID) 100 MG capsule Take 1 capsule by mouth 2 (two) times daily.  0  .  tamsulosin (FLOMAX) 0.4 MG CAPS capsule TAKE ONE CAPSULE BY MOUTH EVERY DAY 30 capsule 11   No current facility-administered medications for this visit.     No Known Allergies  Family History  Problem Relation Age of Onset  . Hypertension Mother   . Alcohol abuse Father   . Bladder Cancer Neg Hx   . Prostate cancer Neg Hx   . Kidney cancer Neg Hx     Social History   Socioeconomic History  . Marital status: Married    Spouse name: Not on file  . Number of children: 3  . Years of education: Not on file  . Highest education level: Not on file  Social Needs  . Financial resource strain: Not on file  . Food insecurity - worry: Not on file  . Food insecurity - inability: Not on file  . Transportation needs - medical: Not on file  . Transportation needs - non-medical: Not on file  Occupational History  . Occupation: Retired    Fish farm manager: RETIRED    Comment: Hydrologist  Tobacco Use  . Smoking status: Former Research scientist (life sciences)  . Smokeless tobacco: Never Used  . Tobacco comment: In [place], quit in 1960's  Substance and Sexual Activity  . Alcohol use: No  . Drug use: No  . Sexual activity: Yes  Other Topics Concern  . Not on file  Social History Narrative   2 Daughters, one son killed in [place]      Has living will   Wife, then daughter Maudie Mercury, is health care POA   Would accept resuscitation attempts but no prolonged life support.   No tube feeding if cognitively unaware     Constitutional: Denies fever, malaise, fatigue, headache or abrupt weight changes.  GU: Pt reports dysuria. Denies urgency, frequency, burning sensation, blood in urine, odor or discharge.   No other specific complaints in a complete review of systems (except as listed in HPI above).  Objective:   Physical Exam   BP 134/80   Pulse 79   Temp 97.7 F (36.5 C) (Oral)   Wt 172 lb (78 kg)   SpO2 97%   BMI 24.33 kg/m  Wt Readings from Last 3 Encounters:  10/17/17 172 lb (78 kg)  09/13/17 175 lb  (79.4 kg)  06/28/17 168 lb (76.2 kg)    General: Appears his stated age, in NAD. Abdomen: Soft and nontender. Normal bowel sounds. No distention or masses noted. No CVA tenderness noted.  BMET    Component Value Date/Time   NA 137 06/22/2017 0441   NA 141 11/11/2015 0926   NA 137 10/06/2014 1322   K 4.1 06/22/2017 0441   K 3.6 10/06/2014 1322   CL 103 06/22/2017 0441   CL 103 10/06/2014 1322   CO2 24 06/22/2017 0441   CO2 28 10/06/2014 1322   GLUCOSE 142 (H) 06/22/2017 0441   GLUCOSE 125 (H) 10/06/2014 1322   BUN 14 06/22/2017 0441   BUN 17 11/11/2015 0926   BUN 11 10/06/2014 1322   CREATININE 1.03 06/22/2017 0441   CREATININE 1.20 10/06/2014 1322   CALCIUM 8.8 (L) 06/22/2017 0441   CALCIUM 8.2 (L) 10/06/2014 1322   GFRNONAA >60 06/22/2017 0441   GFRNONAA >60 10/06/2014 1322   GFRNONAA 59 (L) 11/06/2012 1643   GFRAA >60 06/22/2017 0441   GFRAA >60 10/06/2014 1322   GFRAA >60 11/06/2012 1643    Lipid Panel     Component Value Date/Time   CHOL 214 (H) 12/03/2016 1553   TRIG 53.0 12/03/2016 1553   HDL 57.40 12/03/2016 1553   CHOLHDL 4 12/03/2016 1553   VLDL 10.6 12/03/2016 1553   LDLCALC 146 (H) 12/03/2016 1553    CBC    Component Value Date/Time   WBC 11.9 (H) 06/22/2017 0441   RBC 4.66 06/22/2017 0441   HGB 15.3 06/22/2017 0441   HGB 14.8 10/05/2014 1418   HCT 42.8 06/22/2017 0441   HCT 43.9 10/05/2014 1418   PLT 205 06/22/2017 0441   PLT 239 10/05/2014 1418   MCV 91.9 06/22/2017 0441   MCV 93 10/05/2014 1418   MCH 32.7 06/22/2017 0441   MCHC 35.6 06/22/2017 0441   RDW 13.3 06/22/2017 0441   RDW 13.4 10/05/2014 1418   LYMPHSABS 1.0 06/22/2017 0441   LYMPHSABS 2.0 10/05/2014 1418   MONOABS 1.2 (H) 06/22/2017 0441   MONOABS 1.1 (H) 10/05/2014 1418   EOSABS 0.0 06/22/2017 0441   EOSABS 0.0 10/05/2014 1418   BASOSABS 0.0 06/22/2017 0441   BASOSABS 0.1 10/05/2014 1418    Hgb A1C No results found for: HGBA1C         Assessment & Plan:    Dysuria:  Urinalysis: normal Will send urine culture  Finish your course of Macrobid Increase your fluid intake  Will follow up after urine culture, return precautions discussed Webb Silversmith, NP

## 2017-10-18 LAB — URINE CULTURE
MICRO NUMBER:: 90040744
RESULT: NO GROWTH
SPECIMEN QUALITY: ADEQUATE

## 2017-11-14 ENCOUNTER — Telehealth: Payer: Self-pay | Admitting: Internal Medicine

## 2017-11-14 NOTE — Telephone Encounter (Signed)
Controlled substance Xanax refill request [place]

## 2017-11-14 NOTE — Telephone Encounter (Signed)
Copied from [place]. Topic: Quick Communication - Rx Refill/Question >> Nov 14, 2017  1:20 PM Hewitt Shorts wrote: Medication xanax   Has the patient contacted their pharmacy? Yes       Preferred Pharmacy (with phone number or street name): [address]   Agent: Please be advised that RX refills may take up to 3 business days. We ask that you follow-up with your pharmacy.

## 2017-11-28 ENCOUNTER — Encounter: Payer: Medicare Other | Admitting: Internal Medicine

## 2017-12-04 ENCOUNTER — Other Ambulatory Visit: Payer: Self-pay | Admitting: Internal Medicine

## 2017-12-04 NOTE — Telephone Encounter (Signed)
Last filled 05-29-17 #60 Last OV 09-13-17 Next OV 12-18-17

## 2017-12-17 ENCOUNTER — Encounter: Payer: Medicare Other | Admitting: Internal Medicine

## 2017-12-18 ENCOUNTER — Ambulatory Visit: Payer: Medicare Other | Admitting: Internal Medicine

## 2018-04-14 ENCOUNTER — Encounter: Payer: Self-pay | Admitting: Internal Medicine

## 2018-04-14 ENCOUNTER — Ambulatory Visit (INDEPENDENT_AMBULATORY_CARE_PROVIDER_SITE_OTHER): Payer: Medicare Other | Admitting: Internal Medicine

## 2018-04-14 VITALS — BP 136/84 | HR 68 | Temp 98.1°F | Ht 70.5 in | Wt 170.0 lb

## 2018-04-14 DIAGNOSIS — S80862A Insect bite (nonvenomous), left lower leg, initial encounter: Secondary | ICD-10-CM

## 2018-04-14 DIAGNOSIS — W57XXXA Bitten or stung by nonvenomous insect and other nonvenomous arthropods, initial encounter: Secondary | ICD-10-CM | POA: Insufficient documentation

## 2018-04-14 NOTE — Assessment & Plan Note (Signed)
Surrounding hemorrhage--no signs of ECM or cellulitis No symptoms of illness to suggest RMSF Reassured--no action needed He does try to check daily for ticks

## 2018-04-14 NOTE — Progress Notes (Signed)
Subjective:    Patient ID: [name], male    DOB: 11-21-1932, 84 y.o.   MRN: [id number]  HPI Here due to concerns after a tick bite Lots of them this year This one looks different--has rash around it  Took off tick there ~4 days ago Not engorged Thinks it might have been on more than a day Wife pulled it off with tweezers Itching stopped after 1 day  Now with redness at the site  Current Outpatient Medications on File Prior to Visit  Medication Sig Dispense Refill  . acetaminophen (TYLENOL) 325 MG tablet Take 650 mg by mouth every 6 (six) hours as needed. Reported on 01/17/2016    . ALPRAZolam (XANAX) 0.25 MG tablet TAKE 1 TABLET BY MOUTH TWICE DAILY AS NEEDED FOR ANXIETY 60 tablet 0  . apixaban (ELIQUIS) 2.5 MG TABS tablet Take 2.5 mg by mouth 2 (two) times daily.    [name] diltiazem (TIAZAC) 180 MG 24 hr capsule Take 180 mg by mouth daily.    . finasteride (PROSCAR) 5 MG tablet Take 1 tablet (5 mg total) by mouth daily. 90 tablet 3  . flecainide (TAMBOCOR) 100 MG tablet Take 1 tablet (100 mg total) by mouth 2 (two) times daily. 180 tablet 3  . Glucosamine 500 MG CAPS Take by mouth daily.      . tamsulosin (FLOMAX) 0.4 MG CAPS capsule TAKE ONE CAPSULE BY MOUTH EVERY DAY 30 capsule 11   No current facility-administered medications on file prior to visit.     No Known Allergies  Past Medical History:  Diagnosis Date  . Acute diverticulitis 12/15   [facility]  . Acute diverticulitis   . Allergic rhinitis   . Atrial fibrillation or flutter 12/10   Has been paroxysmal. Patient has not tolerated coumadin due to headaches. ECHO (12/10) Mild LVH, EF 55%,   . BPH (benign prostatic hyperplasia)   . Chest pain 12/10   Lexiscan myoview with EF 55%, no ischemia or infarction  . Dysrhythmia    AFIB  . HOH (hard of hearing)   . Hyperlipemia   . Hypertension   . Osteoarthritis   . Pacemaker  medtronic 02/06/2010   Qualifier: Diagnosis of  By: Lovena Le, MD, [facility], Binnie Kand   . Presence  of permanent cardiac pacemaker   . Sick sinus syndrome (HCC)    S/P dual chamber Medtronic PCM    Past Surgical History:  Procedure Laterality Date  . CATARACT EXTRACTION W/PHACO Left 01/16/2017   Procedure: CATARACT EXTRACTION PHACO AND INTRAOCULAR LENS PLACEMENT (IOC);  Surgeon: Estill Cotta, MD;  Location: [facility] ORS;  Service: Ophthalmology;  Laterality: Left;  [place] 1:51.3 AP% 25.9 CDE 54.80 FLUID PACK LOT # [id number] H  . dual chamber pacemaker  01/11   Dr. Lovena Le  . LAPAROSCOPIC CHOLECYSTECTOMY  1/14   Dr Bary Castilla  . VASECTOMY  in his 8's    Family History  Problem Relation Age of Onset  . Hypertension Mother   . Alcohol abuse Father   . Bladder Cancer Neg Hx   . Prostate cancer Neg Hx   . Kidney cancer Neg Hx     Social History   Socioeconomic History  . Marital status: Married    Spouse name: Not on file  . Number of children: 3  . Years of education: Not on file  . Highest education level: Not on file  Occupational History  . Occupation: Retired    Fish farm manager: RETIRED    Comment: Dealer  Needs  . Financial resource strain: Not on file  . Food insecurity:    Worry: Not on file    Inability: Not on file  . Transportation needs:    Medical: Not on file    Non-medical: Not on file  Tobacco Use  . Smoking status: Former Research scientist (life sciences)  . Smokeless tobacco: Never Used  . Tobacco comment: In [place], quit in 1960's  Substance and Sexual Activity  . Alcohol use: No  . Drug use: No  . Sexual activity: Yes  Lifestyle  . Physical activity:    Days per week: Not on file    Minutes per session: Not on file  . Stress: Not on file  Relationships  . Social connections:    Talks on phone: Not on file    Gets together: Not on file    Attends religious service: Not on file    Active member of club or organization: Not on file    Attends meetings of clubs or organizations: Not on file    Relationship status: Not on file  . Intimate partner violence:     Fear of current or ex partner: Not on file    Emotionally abused: Not on file    Physically abused: Not on file    Forced sexual activity: Not on file  Other Topics Concern  . Not on file  Social History Narrative   2 Daughters, one son killed in [place]      Has living will   Wife, then daughter Maudie Mercury, is health care POA   Would accept resuscitation attempts but no prolonged life support.   No tube feeding if cognitively unaware   Review of Systems No fever No headache No GI symptoms    Objective:   Physical Exam  Constitutional: He appears well-developed. No distress.  Skin:  Left medial calf site Area of ecchymosis surrounding bite site (not inflamed) Eccentric ~2 x 3cm No warmth or tenderness           Assessment & Plan:

## 2018-05-12 ENCOUNTER — Ambulatory Visit (INDEPENDENT_AMBULATORY_CARE_PROVIDER_SITE_OTHER): Payer: Medicare Other | Admitting: Internal Medicine

## 2018-05-12 ENCOUNTER — Encounter: Payer: Self-pay | Admitting: Internal Medicine

## 2018-05-12 VITALS — BP 138/86 | HR 71 | Temp 98.0°F | Ht 70.5 in | Wt 171.0 lb

## 2018-05-12 DIAGNOSIS — R1010 Upper abdominal pain, unspecified: Secondary | ICD-10-CM | POA: Diagnosis not present

## 2018-05-12 MED ORDER — AMOXICILLIN-POT CLAVULANATE 875-125 MG PO TABS: 1.0000 | ORAL_TABLET | Freq: Two times a day (BID) | ORAL | 0 refills | Status: DC

## 2018-05-12 NOTE — Progress Notes (Signed)
Subjective:    Patient ID: [name], male    DOB: January 12, 1933, 84 y.o.   MRN: [id number]  HPI Here due to abdominal pain Having bad pain again---stomach and under ribs---mostly after eating Worried about diverticulitis Will last for an hour or more---"like knives sticking me or something" Started a couple of weeks ago and has worsened  No fever No N/V Has cut down on eating----worse if he eats full meal  Has taken some tylenol Not really helpful  Current Outpatient Medications on File Prior to Visit  Medication Sig Dispense Refill  . acetaminophen (TYLENOL) 325 MG tablet Take 650 mg by mouth every 6 (six) hours as needed. Reported on 01/17/2016    . ALPRAZolam (XANAX) 0.25 MG tablet TAKE 1 TABLET BY MOUTH TWICE DAILY AS NEEDED FOR ANXIETY 60 tablet 0  . apixaban (ELIQUIS) 2.5 MG TABS tablet Take 2.5 mg by mouth 2 (two) times daily.    [name] diltiazem (TIAZAC) 180 MG 24 hr capsule Take 180 mg by mouth daily.    . finasteride (PROSCAR) 5 MG tablet Take 1 tablet (5 mg total) by mouth daily. 90 tablet 3  . flecainide (TAMBOCOR) 100 MG tablet Take 1 tablet (100 mg total) by mouth 2 (two) times daily. 180 tablet 3  . Glucosamine 500 MG CAPS Take by mouth daily.      . tamsulosin (FLOMAX) 0.4 MG CAPS capsule TAKE ONE CAPSULE BY MOUTH EVERY DAY 30 capsule 11   No current facility-administered medications on file prior to visit.     No Known Allergies  Past Medical History:  Diagnosis Date  . Acute diverticulitis 12/15   [facility]  . Acute diverticulitis   . Allergic rhinitis   . Atrial fibrillation or flutter 12/10   Has been paroxysmal. Patient has not tolerated coumadin due to headaches. ECHO (12/10) Mild LVH, EF 55%,   . BPH (benign prostatic hyperplasia)   . Chest pain 12/10   Lexiscan myoview with EF 55%, no ischemia or infarction  . Dysrhythmia    AFIB  . HOH (hard of hearing)   . Hyperlipemia   . Hypertension   . Osteoarthritis   . Pacemaker  medtronic 02/06/2010   Qualifier: Diagnosis of  By: Lovena Le, MD, [facility], [facility]   . Presence of permanent cardiac pacemaker   . Sick sinus syndrome (HCC)    S/P dual chamber Medtronic PCM    Past Surgical History:  Procedure Laterality Date  . CATARACT EXTRACTION W/PHACO Left 01/16/2017   Procedure: CATARACT EXTRACTION PHACO AND INTRAOCULAR LENS PLACEMENT (IOC);  Surgeon: Estill Cotta, MD;  Location: [facility] ORS;  Service: Ophthalmology;  Laterality: Left;  [place] 1:51.3 AP% 25.9 CDE 54.80 FLUID PACK LOT # [id number]  . dual chamber pacemaker  01/11   Dr. Lovena Le  . LAPAROSCOPIC CHOLECYSTECTOMY  1/14   Dr Bary Castilla  . VASECTOMY  in his 88's    Family History  Problem Relation Age of Onset  . Hypertension Mother   . Alcohol abuse Father   . Bladder Cancer Neg Hx   . Prostate cancer Neg Hx   . Kidney cancer Neg Hx     Social History   Socioeconomic History  . Marital status: Married    Spouse name: Not on file  . Number of children: 3  . Years of education: Not on file  . Highest education level: Not on file  Occupational History  . Occupation: Retired    Fish farm manager: RETIRED    Comment: Hydrologist  Social Needs  . Financial resource strain: Not on file  . Food insecurity:    Worry: Not on file    Inability: Not on file  . Transportation needs:    Medical: Not on file    Non-medical: Not on file  Tobacco Use  . Smoking status: Former Research scientist (life sciences)  . Smokeless tobacco: Never Used  . Tobacco comment: In [place], quit in 1960's  Substance and Sexual Activity  . Alcohol use: No  . Drug use: No  . Sexual activity: Yes  Lifestyle  . Physical activity:    Days per week: Not on file    Minutes per session: Not on file  . Stress: Not on file  Relationships  . Social connections:    Talks on phone: Not on file    Gets together: Not on file    Attends religious service: Not on file    Active member of club or organization: Not on file    Attends meetings of clubs or organizations: Not on  file    Relationship status: Not on file  . Intimate partner violence:    Fear of current or ex partner: Not on file    Emotionally abused: Not on file    Physically abused: Not on file    Forced sexual activity: Not on file  Other Topics Concern  . Not on file  Social History Narrative   2 Daughters, one son killed in [place]      Has living will   Wife, then daughter Maudie Mercury, is health care POA   Would accept resuscitation attempts but no prolonged life support.   No tube feeding if cognitively unaware   Review of Systems Has lost a couple of pounds No cough or SOB Some allergy sinus congestion    Objective:   Physical Exam  Constitutional: He appears well-developed. No distress.  Neck: No thyromegaly present.  Cardiovascular: Normal rate, regular rhythm and normal heart sounds. Exam reveals no gallop.  No murmur heard. Respiratory: Effort normal and breath sounds normal. No respiratory distress. He has no wheezes. He has no rales.  GI: Soft. Bowel sounds are normal. He exhibits no distension. There is no rebound and no guarding.  RUQ/LUQ tenderness---mild  Musculoskeletal: He exhibits no edema.  Lymphadenopathy:    He has no cervical adenopathy.           Assessment & Plan:

## 2018-05-12 NOTE — Assessment & Plan Note (Signed)
Both sides He feels this is like the previous diverticulitis---which was proximal descending colon. Now on right side also Differential would be acid related illness (not typical for this), choledocholithiasis, gastritis Will try augmentin empirically Going to GI at [facility] next week---will leave testing to them if not better

## 2018-06-06 ENCOUNTER — Other Ambulatory Visit: Payer: Self-pay | Admitting: Internal Medicine

## 2018-06-10 NOTE — Telephone Encounter (Signed)
Last filled 12-04-17 #60 Last OV Acute 05-12-18 No Future OV

## 2018-07-14 ENCOUNTER — Encounter: Payer: Self-pay | Admitting: Internal Medicine

## 2018-07-14 ENCOUNTER — Ambulatory Visit (INDEPENDENT_AMBULATORY_CARE_PROVIDER_SITE_OTHER): Payer: Medicare Other | Admitting: Internal Medicine

## 2018-07-14 ENCOUNTER — Ambulatory Visit: Payer: Self-pay | Admitting: Internal Medicine

## 2018-07-14 VITALS — BP 134/76 | HR 81 | Temp 98.1°F | Ht 70.5 in | Wt 171.0 lb

## 2018-07-14 DIAGNOSIS — H5319 Other subjective visual disturbances: Secondary | ICD-10-CM | POA: Diagnosis not present

## 2018-07-14 DIAGNOSIS — Z23 Encounter for immunization: Secondary | ICD-10-CM

## 2018-07-14 NOTE — Assessment & Plan Note (Signed)
Not unilateral loss Feels it was in both eyes---making retinal phenomenon less likely Not classic for TIA Could be acephalic migraine  He is concerned about right eye vision being so poor---is planning to go back to the [place] to consider cataract extraction there  Discussed options---will hold off on carotids/MRI for now If recurs, will plan to do these He will back to [place] for repeat eye exam Discussed 911 for any clear neuro symptoms

## 2018-07-14 NOTE — Addendum Note (Signed)
Addended by: Jacqualin Combes on: 07/14/2018 03:07 PM   Modules accepted: Orders

## 2018-07-14 NOTE — Progress Notes (Signed)
Subjective:    Patient ID: [name], male    DOB: 1933/08/21, 84 y.o.   MRN: [id number]  HPI Here due to dizziness and vision change  Has had occasional spells over the past 4-5 months 4-5 total spells Bad spell this morning Sitting reading bible and the words started to "come together" Looked up---saw lights off in the periphery Lasted 10 minutes or so--then cleared up  Can occur at rest or when up Not truly dizzy---just feeling of being unstable (due to change in vision) If he closes eyes---gets same sensation  [facility] ~1 month [facility] No findings at that time Does have poor vision in right eye---cataract not removed  Current Outpatient Medications on File Prior to Visit  Medication Sig Dispense Refill  . acetaminophen (TYLENOL) 325 MG tablet Take 650 mg by mouth every 6 (six) hours as needed. Reported on 01/17/2016    . ALPRAZolam (XANAX) 0.25 MG tablet TAKE 1 TABLET BY MOUTH TWICE DAILY AS NEEDED FOR ANXIETY 60 tablet 0  . apixaban (ELIQUIS) 2.5 MG TABS tablet Take 2.5 mg by mouth 2 (two) times daily.    [name] diltiazem (TIAZAC) 180 MG 24 hr capsule Take 180 mg by mouth daily.    . finasteride (PROSCAR) 5 MG tablet Take 1 tablet (5 mg total) by mouth daily. 90 tablet 3  . flecainide (TAMBOCOR) 100 MG tablet Take 1 tablet (100 mg total) by mouth 2 (two) times daily. 180 tablet 3  . Glucosamine 500 MG CAPS Take by mouth daily.      . tamsulosin (FLOMAX) 0.4 MG CAPS capsule TAKE ONE CAPSULE BY MOUTH EVERY DAY 30 capsule 11   No current facility-administered medications on file prior to visit.     No Known Allergies  Past Medical History:  Diagnosis Date  . Acute diverticulitis 12/15   [facility]  . Acute diverticulitis   . Allergic rhinitis   . Atrial fibrillation or flutter 12/10   Has been paroxysmal. Patient has not tolerated coumadin due to headaches. ECHO (12/10) Mild LVH, EF 55%,   . BPH (benign prostatic hyperplasia)   . Chest pain 12/10   Lexiscan myoview with EF 55%, no ischemia or infarction  . Dysrhythmia    AFIB  . HOH (hard of hearing)   . Hyperlipemia   . Hypertension   . Osteoarthritis   . Pacemaker  medtronic 02/06/2010   Qualifier: Diagnosis of  By: Lovena Le, MD, [facility], [facility]   . Presence of permanent cardiac pacemaker   . Sick sinus syndrome (HCC)    S/P dual chamber Medtronic PCM    Past Surgical History:  Procedure Laterality Date  . CATARACT EXTRACTION W/PHACO Left 01/16/2017   Procedure: CATARACT EXTRACTION PHACO AND INTRAOCULAR LENS PLACEMENT (IOC);  Surgeon: Estill Cotta, MD;  Location: [facility] ORS;  Service: Ophthalmology;  Laterality: Left;  [place] 1:51.3 AP% 25.9 CDE 54.80 FLUID PACK LOT # [id number] H  . dual chamber pacemaker  01/11   Dr. Lovena Le  . LAPAROSCOPIC CHOLECYSTECTOMY  1/14   Dr Bary Castilla  . VASECTOMY  in his 24's    Family History  Problem Relation Age of Onset  . Hypertension Mother   . Alcohol abuse Father   . Bladder Cancer Neg Hx   . Prostate cancer Neg Hx   . Kidney cancer Neg Hx     Social History   Socioeconomic History  . Marital status: Married    Spouse name: Not on file  .  Number of children: 3  . Years of education: Not on file  . Highest education level: Not on file  Occupational History  . Occupation: Retired    Fish farm manager: RETIRED    Comment: Arboriculturist  . Financial resource strain: Not on file  . Food insecurity:    Worry: Not on file    Inability: Not on file  . Transportation needs:    Medical: Not on file    Non-medical: Not on file  Tobacco Use  . Smoking status: Former Research scientist (life sciences)  . Smokeless tobacco: Never Used  . Tobacco comment: In [place], quit in 1960's  Substance and Sexual Activity  . Alcohol use: No  . Drug use: No  . Sexual activity: Yes  Lifestyle  . Physical activity:    Days per week: Not on file    Minutes per session: Not on file  . Stress: Not on file  Relationships  . Social connections:    Talks on  phone: Not on file    Gets together: Not on file    Attends religious service: Not on file    Active member of club or organization: Not on file    Attends meetings of clubs or organizations: Not on file    Relationship status: Not on file  . Intimate partner violence:    Fear of current or ex partner: Not on file    Emotionally abused: Not on file    Physically abused: Not on file    Forced sexual activity: Not on file  Other Topics Concern  . Not on file  Social History Narrative   2 Daughters, one son killed in [place]      Has living will   Wife, then daughter Maudie Mercury, is health care POA   Would accept resuscitation attempts but no prolonged life support.   No tube feeding if cognitively unaware   Review of Systems No headache No dysphagia, facial droop, focal weakness, etc No history of migraines    Objective:   Physical Exam  Constitutional: He appears well-developed. No distress.  HENT:  Mouth/Throat: Oropharynx is clear and moist. No oropharyngeal exudate.  Eyes: Pupils are equal, round, and reactive to light. Conjunctivae and EOM are normal.  Neck: No thyromegaly present.  No carotid bruit  Cardiovascular: Normal rate, regular rhythm and normal heart sounds. Exam reveals no gallop.  No murmur heard. Respiratory: Effort normal and breath sounds normal. No respiratory distress. He has no wheezes. He has no rales.  Musculoskeletal: He exhibits no edema.  Lymphadenopathy:    He has no cervical adenopathy.           Assessment & Plan:

## 2018-07-14 NOTE — Telephone Encounter (Signed)
Sounds more like a retinal problem than TIA ---but good to start here

## 2018-07-14 NOTE — Telephone Encounter (Signed)
Unable to reach pt by phone x 2 to see if wants sooner appt.

## 2018-07-14 NOTE — Telephone Encounter (Signed)
Called in c/o having intermittent blurry vision and seeing flashes of light in the peripheral vision.  He is feeling unsteady on this feet when he has these episodes of blurry vision with the flashing of lights.  Since this has been happening for at least 30 days I made him appt with Dr. Silvio Pate for today at 2:00.   This is the earliest appt.  I instructed him to go to the ED if his symptoms became worse, he has slurred speech, weakness on one side of his body, headache, confusion or dizziness.   He verbalized understanding,  "I know to go to the emergency room".      Routed this note to Dr. Silvio Pate so he would be aware.   Reason for Disposition . Flashes of light  (Exception: brief from pressing on the eyeball)  Answer Assessment - Initial Assessment Questions 1. DESCRIPTION: "What is the vision loss like? Describe it for me." (e.g., complete vision loss, blurred vision, double vision, floaters, etc.)     It not happen all the time.   It happened this morning.   Everything gets blurry and I see flashes of light on the peripheral sides.   I'm unsteady on my feet.   It's happened in the past several times too.   BP is 119/70. P-80.     2. LOCATION: "One or both eyes?" If one, ask: "Which eye?"     It happens in both eyes.   No pain. 3. SEVERITY: "Can you see anything?" If so, ask: "What can you see?" (e.g., fine print)     Lasts about 10 minutes.   No history of mini stroke.    It happens at different times.    Sometimes I may go 30 days and nothing happens then have an episode twice in a week. 4. ONSET: "When did this begin?" "Did it start suddenly or has this been gradual?"     See above. 5. PATTERN: "Does this come and go, or has it been constant since it started?"     Intermittent 6. PAIN: "Is there any pain in your eye(s)?"  (Scale 1-10; or mild, moderate, severe)     No 7. CONTACTS-GLASSES: "Do you wear contacts or glasses?"     I use reading glasses. 8. CAUSE: "What do you think is  causing this visual problem?"     I'm wondering about a mini stroke. 9. OTHER SYMPTOMS: "Do you have any other symptoms?" (e.g., confusion, headache, arm or leg weakness, speech problems)     Only blurry vision and unsteady on my feet when the blurry vision happens.  I had a cataract removed about 2 years ago.  10. PREGNANCY: "Is there any chance you are pregnant?" "When was your last menstrual period?"      BN/A  Protocols used: [place]

## 2018-08-08 ENCOUNTER — Other Ambulatory Visit: Payer: Self-pay | Admitting: Internal Medicine

## 2018-08-08 NOTE — Telephone Encounter (Signed)
Last filled 06-10-18 #60 Last OV 07-14-18 Next OV 01-12-19

## 2018-08-20 ENCOUNTER — Other Ambulatory Visit: Payer: Self-pay

## 2018-08-20 ENCOUNTER — Ambulatory Visit (INDEPENDENT_AMBULATORY_CARE_PROVIDER_SITE_OTHER): Payer: Medicare Other | Admitting: Gastroenterology

## 2018-08-20 ENCOUNTER — Encounter: Payer: Self-pay | Admitting: Gastroenterology

## 2018-08-20 VITALS — BP 167/95 | HR 73 | Resp 17 | Ht 70.5 in | Wt 174.4 lb

## 2018-08-20 DIAGNOSIS — D126 Benign neoplasm of colon, unspecified: Secondary | ICD-10-CM

## 2018-08-20 DIAGNOSIS — K5909 Other constipation: Secondary | ICD-10-CM

## 2018-08-20 MED ORDER — HYDROCORTISONE 2.5 % RE CREA: 1.0000 "application " | TOPICAL_CREAM | Freq: Two times a day (BID) | RECTAL | 0 refills | Status: AC

## 2018-08-20 NOTE — Progress Notes (Signed)
adem  Cephas Darby, MD [address]  [place]  [place], [place] [address]  Main: [phone]  Fax: [phone]    Gastroenterology Consultation  Referring Provider:     [facility] Primary Care Physician:  [name], MD Primary Gastroenterologist:  Dr. Cephas Darby Reason for Consultation:     Rectal discharge        HPI:   [name] is a 84 y.o. Caucasian male referred by Dr. [name], MD  for consultation & management of chronic rectal discharge.  Patient has history of A. fib, pacemaker on Eliquis, very active, functional independent who has been experiencing drainage of clear mucus per rectum. He has been suffering from constipation for several years, takes laxatives as needed.  Bowel movements are infrequent, every 2 to 3 days, generally hard associated with straining.  He tries to incorporate more fiber in his diet, has tried fiber supplements as well as stool softeners with not much relief.  He he has been noticing only clear mucus drainage per rectum.  Denies any fecal incontinence, diarrhea or rectal bleeding.  His weight has been stable.  He also reports intermittent bloating He is status post cholecystectomy in 2014 NSAIDs: None  Antiplts/Anticoagulants/Anti thrombotics: Eliquis with history of A. fib  GI Procedures: Colonoscopy in 2006, tubular adenoma COLON, POLYP(S): ADENOMATOUS POLYP(S). NO HIGH GRADE DYSPLASIA OR INVASIVE MALIGNANCY IDENTIFIED.  COMMENT There is adenoma with nuclear stratification and decreased mucin. High grade dysplasia or invasive malignancy are not identified.  Past Medical History:  Diagnosis Date  . Acute diverticulitis 12/15   [facility]  . Acute diverticulitis   . Allergic rhinitis   . Atrial fibrillation or flutter 12/10   Has been paroxysmal. Patient has not tolerated coumadin due to headaches. ECHO (12/10) Mild LVH, EF 55%,   . BPH (benign prostatic hyperplasia)   . Chest pain 12/10   Lexiscan myoview with EF 55%, no ischemia or infarction  . Dysrhythmia    AFIB  . HOH (hard of hearing)   . Hyperlipemia   . Hypertension   . Osteoarthritis   . Pacemaker  medtronic 02/06/2010   Qualifier: Diagnosis of  By: Lovena Le, MD, [facility], [facility]   . Presence of permanent cardiac pacemaker   . Sick sinus syndrome (HCC)    S/P dual chamber Medtronic PCM    Past Surgical History:  Procedure Laterality Date  . CATARACT EXTRACTION W/PHACO Left 01/16/2017   Procedure: CATARACT EXTRACTION PHACO AND INTRAOCULAR LENS PLACEMENT (IOC);  Surgeon: Estill Cotta, MD;  Location: [facility] ORS;  Service: Ophthalmology;  Laterality: Left;  [place] 1:51.3 AP% 25.9 CDE 54.80 FLUID PACK LOT # [id number] H  . dual chamber pacemaker  01/11   Dr. Lovena Le  . LAPAROSCOPIC CHOLECYSTECTOMY  1/14   Dr Bary Castilla  . VASECTOMY  in his 2's    Current Outpatient Medications:  .  acetaminophen (TYLENOL) 325 MG tablet, Take 650 mg by mouth every 6 (six) hours as needed. Reported on 01/17/2016, Disp: , Rfl:  .  ALPRAZolam (XANAX) 0.25 MG tablet, TAKE 1 TABLET BY MOUTH TWICE DAILY AS NEEDED FOR ANXIETY, Disp: 60 tablet, Rfl: 0 .  apixaban (ELIQUIS) 2.5 MG TABS tablet, Take 2.5 mg by mouth 2 (two) times daily., Disp: , Rfl:  .  diltiazem (TIAZAC) 180 MG 24 hr capsule, Take 180 mg by mouth daily., Disp: , Rfl:  .  finasteride (PROSCAR) 5 MG tablet, Take 1 tablet (5 mg total) by mouth daily., Disp: 90  tablet, Rfl: 3 .  flecainide (TAMBOCOR) 100 MG tablet, Take 1 tablet (100 mg total) by mouth 2 (two) times daily., Disp: 180 tablet, Rfl: 3 .  Glucosamine 500 MG CAPS, Take by mouth daily.  , Disp: , Rfl:  .  tamsulosin (FLOMAX) 0.4 MG CAPS capsule, TAKE ONE CAPSULE BY MOUTH EVERY DAY, Disp: 30 capsule, Rfl: 11 .  hydrocortisone (ANUSOL-HC) 2.5 % rectal cream, Place 1 application rectally 2 (two) times daily for 14 days., Disp: 30 g, Rfl: 0    Family History  Problem Relation Age of Onset  . Hypertension Mother   .  Alcohol abuse Father   . Bladder Cancer Neg Hx   . Prostate cancer Neg Hx   . Kidney cancer Neg Hx      Social History   Tobacco Use  . Smoking status: Former Research scientist (life sciences)  . Smokeless tobacco: Never Used  . Tobacco comment: In [place], quit in 1960's  Substance Use Topics  . Alcohol use: No  . Drug use: No    Allergies as of 08/20/2018  . (No Known Allergies)    Review of Systems:    All systems reviewed and negative except where noted in HPI.   Physical Exam:  BP (!) 167/95 (BP Location: Left Arm, Patient Position: Sitting, Cuff Size: Normal)   Pulse 73   Resp 17   Ht 5' 10.5" (1.791 m)   Wt 174 lb 6.4 oz (79.1 kg)   BMI 24.67 kg/m  No LMP for male patient.  General:   Alert,  Well-developed, well-nourished, pleasant and cooperative in NAD Head:  Normocephalic and atraumatic. Eyes:  Sclera clear, no icterus.   Conjunctiva pink. Ears:  Normal auditory acuity. Nose:  No deformity, discharge, or lesions. Mouth:  No deformity or lesions,oropharynx pink & moist. Neck:  Supple; no masses or thyromegaly. Lungs:  Respirations even and unlabored.  Clear throughout to auscultation.   No wheezes, crackles, or rhonchi. No acute distress. Heart:  Regular rate and rhythm; no murmurs, clicks, rubs, or gallops. Abdomen:  Normal bowel sounds. Soft, non-tender and non-distended without masses, hepatosplenomegaly or hernias noted.  No guarding or rebound tenderness.   Rectal: Not performed Msk:  Symmetrical without gross deformities. Good, equal movement & strength bilaterally. Pulses:  Normal pulses noted. Extremities:  No clubbing or edema.  No cyanosis. Neurologic:  Alert and oriented x3;  grossly normal neurologically. Skin:  Intact without significant lesions or rashes. No jaundice. Psych:  Alert and cooperative. Normal mood and affect.  Imaging Studies: None  Assessment and Plan:   SAYVION VIGEN is a 84 y.o. Caucasian male with history of A. fib, status post pacemaker, on Eliquis  with history of tubular adenoma of colon in 2006, chronic constipation and clear mucus drainage per rectum Patient does not have fecal incontinence.  Chronic constipation Continue high-fiber diet Trial of linaclotide 72 MCG daily, samples provided  Mucus drainage per rectum: Nonbloody, likely secondary to hemorrhoids with history of chronic constipation Trial of Anusol cream twice daily for 2 weeks  Tubular adenoma of colon in 2006 Patient is physically active, plays golf and functionally independent.  Given that overall he is doing well, I recommend surveillance colonoscopy and patient is agreeable  I have discussed alternative options, risks & benefits,  which include, but are not limited to, bleeding, infection, perforation,respiratory complication & drug reaction.  The patient agrees with this plan & written consent will be obtained.     Follow up in 2 months  Cephas Darby, MD

## 2018-09-03 ENCOUNTER — Encounter: Payer: Self-pay | Admitting: *Deleted

## 2018-09-03 ENCOUNTER — Other Ambulatory Visit: Payer: Self-pay

## 2018-09-15 ENCOUNTER — Encounter
Admission: RE | Disposition: A | Discharge status: Home / Self Care | Payer: Self-pay | Source: Ambulatory Visit | Attending: Gastroenterology

## 2018-09-15 ENCOUNTER — Encounter: Payer: Self-pay | Admitting: Certified Registered Nurse Anesthetist

## 2018-09-15 ENCOUNTER — Ambulatory Visit: Payer: No Typology Code available for payment source | Admitting: Certified Registered Nurse Anesthetist

## 2018-09-15 ENCOUNTER — Ambulatory Visit
Admission: RE | Admit: 2018-09-15 | Discharge: 2018-09-15 | Disposition: A | Discharge status: Home / Self Care | Payer: No Typology Code available for payment source | Source: Ambulatory Visit | Attending: Gastroenterology | Admitting: Gastroenterology

## 2018-09-15 DIAGNOSIS — D120 Benign neoplasm of cecum: Secondary | ICD-10-CM | POA: Insufficient documentation

## 2018-09-15 DIAGNOSIS — Z79899 Other long term (current) drug therapy: Secondary | ICD-10-CM | POA: Insufficient documentation

## 2018-09-15 DIAGNOSIS — D123 Benign neoplasm of transverse colon: Secondary | ICD-10-CM | POA: Insufficient documentation

## 2018-09-15 DIAGNOSIS — K6389 Other specified diseases of intestine: Secondary | ICD-10-CM | POA: Insufficient documentation

## 2018-09-15 DIAGNOSIS — K5730 Diverticulosis of large intestine without perforation or abscess without bleeding: Secondary | ICD-10-CM | POA: Insufficient documentation

## 2018-09-15 DIAGNOSIS — Z1211 Encounter for screening for malignant neoplasm of colon: Secondary | ICD-10-CM | POA: Insufficient documentation

## 2018-09-15 DIAGNOSIS — D128 Benign neoplasm of rectum: Secondary | ICD-10-CM | POA: Insufficient documentation

## 2018-09-15 DIAGNOSIS — Z86010 Personal history of colonic polyps: Secondary | ICD-10-CM | POA: Diagnosis not present

## 2018-09-15 DIAGNOSIS — K644 Residual hemorrhoidal skin tags: Secondary | ICD-10-CM | POA: Insufficient documentation

## 2018-09-15 DIAGNOSIS — D124 Benign neoplasm of descending colon: Secondary | ICD-10-CM

## 2018-09-15 DIAGNOSIS — Z87891 Personal history of nicotine dependence: Secondary | ICD-10-CM | POA: Diagnosis not present

## 2018-09-15 DIAGNOSIS — N400 Enlarged prostate without lower urinary tract symptoms: Secondary | ICD-10-CM | POA: Diagnosis not present

## 2018-09-15 DIAGNOSIS — I495 Sick sinus syndrome: Secondary | ICD-10-CM | POA: Diagnosis not present

## 2018-09-15 DIAGNOSIS — I480 Paroxysmal atrial fibrillation: Secondary | ICD-10-CM | POA: Diagnosis not present

## 2018-09-15 DIAGNOSIS — Z950 Presence of cardiac pacemaker: Secondary | ICD-10-CM | POA: Diagnosis not present

## 2018-09-15 DIAGNOSIS — H9190 Unspecified hearing loss, unspecified ear: Secondary | ICD-10-CM | POA: Diagnosis not present

## 2018-09-15 DIAGNOSIS — K648 Other hemorrhoids: Secondary | ICD-10-CM | POA: Insufficient documentation

## 2018-09-15 DIAGNOSIS — Z7901 Long term (current) use of anticoagulants: Secondary | ICD-10-CM | POA: Diagnosis not present

## 2018-09-15 DIAGNOSIS — K621 Rectal polyp: Secondary | ICD-10-CM

## 2018-09-15 DIAGNOSIS — I10 Essential (primary) hypertension: Secondary | ICD-10-CM | POA: Insufficient documentation

## 2018-09-15 DIAGNOSIS — K219 Gastro-esophageal reflux disease without esophagitis: Secondary | ICD-10-CM | POA: Diagnosis not present

## 2018-09-15 DIAGNOSIS — D122 Benign neoplasm of ascending colon: Secondary | ICD-10-CM

## 2018-09-15 DIAGNOSIS — D126 Benign neoplasm of colon, unspecified: Secondary | ICD-10-CM | POA: Diagnosis not present

## 2018-09-15 DIAGNOSIS — K5790 Diverticulosis of intestine, part unspecified, without perforation or abscess without bleeding: Secondary | ICD-10-CM | POA: Diagnosis not present

## 2018-09-15 HISTORY — PX: COLONOSCOPY WITH PROPOFOL: SHX5780

## 2018-09-15 SURGERY — COLONOSCOPY WITH PROPOFOL
Anesthesia: General

## 2018-09-15 MED ORDER — PROPOFOL 500 MG/50ML IV EMUL
INTRAVENOUS | Status: AC
  Filled 2018-09-15: qty 50

## 2018-09-15 MED ORDER — PROPOFOL 10 MG/ML IV BOLUS
INTRAVENOUS | Status: DC | PRN
  Administered 2018-09-15: 80 mg via INTRAVENOUS

## 2018-09-15 MED ORDER — PROPOFOL 500 MG/50ML IV EMUL
INTRAVENOUS | Status: DC | PRN
  Administered 2018-09-15: 130 ug/kg/min via INTRAVENOUS

## 2018-09-15 MED ORDER — LINACLOTIDE 72 MCG PO CAPS: 72.0000 ug | ORAL_CAPSULE | Freq: Every day | ORAL | 0 refills | Status: DC

## 2018-09-15 MED ORDER — LIDOCAINE HCL (CARDIAC) PF 100 MG/5ML IV SOSY
PREFILLED_SYRINGE | INTRAVENOUS | Status: DC | PRN
  Administered 2018-09-15: 50 mg via INTRAVENOUS

## 2018-09-15 MED ORDER — SODIUM CHLORIDE 0.9 % IV SOLN
INTRAVENOUS | Status: DC
  Administered 2018-09-15: 1000 mL via INTRAVENOUS

## 2018-09-15 MED ORDER — LIDOCAINE HCL (PF) 2 % IJ SOLN
INTRAMUSCULAR | Status: AC
  Filled 2018-09-15: qty 10

## 2018-09-15 NOTE — Anesthesia Preprocedure Evaluation (Addendum)
Anesthesia Evaluation  Patient identified by MRN, date of birth, ID band Patient awake    Reviewed: Allergy & Precautions, H&P , NPO status , Patient's Chart, lab work & pertinent test results  History of Anesthesia Complications (+) history of anesthetic complications ( urinary retention)  Airway Mallampati: III  TM Distance: <3 FB Neck ROM: limited    Dental  (+) Chipped   Pulmonary neg shortness of breath, former smoker,           Cardiovascular Exercise Tolerance: Good hypertension, (-) angina(-) Past MI and (-) DOE + dysrhythmias Atrial Fibrillation + pacemaker      Neuro/Psych PSYCHIATRIC DISORDERS negative neurological ROS     GI/Hepatic Neg liver ROS, GERD  Medicated and Controlled,  Endo/Other  negative endocrine ROS  Renal/GU negative Renal ROS  negative genitourinary   Musculoskeletal  (+) Arthritis ,   Abdominal   Peds  Hematology negative hematology ROS (+)   Anesthesia Other Findings Past Medical History: 12/15: Acute diverticulitis     Comment:  [facility] No date: Acute diverticulitis No date: Allergic rhinitis 12/10: Atrial fibrillation or flutter     Comment:  Has been paroxysmal. Patient has not tolerated coumadin               due to headaches. ECHO (12/10) Mild LVH, EF 55%,  No date: BPH (benign prostatic hyperplasia) 12/10: Chest pain     Comment:  Lexiscan myoview with EF 55%, no ischemia or infarction No date: Dysrhythmia     Comment:  AFIB No date: HOH (hard of hearing) No date: Hyperlipemia No date: Hypertension No date: Osteoarthritis 02/06/2010: Pacemaker  medtronic     Comment:  Qualifier: Diagnosis of  By: Lovena Le, MD, Martyn Malay  No date: Presence of permanent cardiac pacemaker No date: Sick sinus syndrome ([place])     Comment:  S/P dual chamber Medtronic PCM  Past Surgical History: 01/16/2017: CATARACT EXTRACTION W/PHACO; Left     Comment:  Procedure:  CATARACT EXTRACTION PHACO AND INTRAOCULAR               LENS PLACEMENT (IOC);  Surgeon: Estill Cotta, MD;                Location: [facility] ORS;  Service: Ophthalmology;  Laterality:              Left;  [place] 1:51.3 AP% 25.9 CDE 54.80 FLUID PACK LOT #               [id number] 01/11: dual chamber pacemaker     Comment:  Dr. Lovena Le 1/14: LAPAROSCOPIC CHOLECYSTECTOMY     Comment:  Dr Bary Castilla in his 24's: VASECTOMY  BMI    Body Mass Index:  23.71 kg/m      Reproductive/Obstetrics negative OB ROS                            Anesthesia Physical Anesthesia Plan  ASA: III  Anesthesia Plan: General   Post-op Pain Management:    Induction: Intravenous  PONV Risk Score and Plan: Propofol infusion and TIVA  Airway Management Planned: Natural Airway and Nasal Cannula  Additional Equipment:   Intra-op Plan:   Post-operative Plan:   Informed Consent: I have reviewed the patients History and Physical, chart, labs and discussed the procedure including the risks, benefits and alternatives for the proposed anesthesia with the patient  or authorized representative who has indicated his/her understanding and acceptance.   Dental Advisory Given  Plan Discussed with: Anesthesiologist, CRNA and Surgeon  Anesthesia Plan Comments: (Patient reports urinary retention with previous colonoscopy, plan to avoid narcotics today   Patient consented for risks of anesthesia including but not limited to:  - adverse reactions to medications - risk of intubation if required - damage to teeth, lips or other oral mucosa - sore throat or hoarseness - Damage to heart, brain, lungs or loss of life  Patient voiced understanding.)       Anesthesia Quick Evaluation

## 2018-09-15 NOTE — Transfer of Care (Signed)
Immediate Anesthesia Transfer of Care Note  Patient: [name]  Procedure(s) Performed: COLONOSCOPY WITH PROPOFOL (N/A )  Patient Location: PACU  Anesthesia Type:General  Level of Consciousness: drowsy  Airway & Oxygen Therapy: Patient Spontanous Breathing  Post-op Assessment: Report given to RN and Post -op Vital signs reviewed and stable  Post vital signs: Reviewed and stable  Last Vitals:  Vitals Value Taken Time  BP 92/42 09/15/2018  9:17 AM  Temp 36.3 C 09/15/2018  9:16 AM  Pulse 60 09/15/2018  9:18 AM  Resp 11 09/15/2018  9:18 AM  SpO2 98 % 09/15/2018  9:18 AM  Vitals shown include unvalidated device data.  Last Pain:  Vitals:   09/15/18 0916  TempSrc: Tympanic  PainSc: Asleep         Complications: No apparent anesthesia complications

## 2018-09-15 NOTE — H&P (Signed)
Cephas Darby, MD [address]  [place]  [place], [place] [address]  Main: [phone]  Fax: [phone] Pager: [phone]  Primary Care Physician:  [name], MD Primary Gastroenterologist:  Dr. Cephas Darby  Pre-Procedure History & Physical: HPI:  [name] is a 84 y.o. male is here for an colonoscopy.   Past Medical History:  Diagnosis Date  . Acute diverticulitis 12/15   [facility]  . Acute diverticulitis   . Allergic rhinitis   . Atrial fibrillation or flutter 12/10   Has been paroxysmal. Patient has not tolerated coumadin due to headaches. ECHO (12/10) Mild LVH, EF 55%,   . BPH (benign prostatic hyperplasia)   . Chest pain 12/10   Lexiscan myoview with EF 55%, no ischemia or infarction  . Dysrhythmia    AFIB  . HOH (hard of hearing)   . Hyperlipemia   . Hypertension   . Osteoarthritis   . Pacemaker  medtronic 02/06/2010   Qualifier: Diagnosis of  By: Lovena Le, MD, [facility], Binnie Kand   . Presence of permanent cardiac pacemaker   . Sick sinus syndrome ([facility])    S/P dual chamber Medtronic PCM    Past Surgical History:  Procedure Laterality Date  . CATARACT EXTRACTION W/PHACO Left 01/16/2017   Procedure: CATARACT EXTRACTION PHACO AND INTRAOCULAR LENS PLACEMENT (IOC);  Surgeon: Estill Cotta, MD;  Location: [facility] ORS;  Service: Ophthalmology;  Laterality: Left;  [place] 1:51.3 AP% 25.9 CDE 54.80 FLUID PACK LOT # [id number]  . dual chamber pacemaker  01/11   Dr. Lovena Le  . LAPAROSCOPIC CHOLECYSTECTOMY  1/14   Dr Bary Castilla  . VASECTOMY  in his 73's    Prior to Admission medications   Medication Sig Start Date End Date Taking? Authorizing Provider  acetaminophen (TYLENOL) 325 MG tablet Take 650 mg by mouth every 6 (six) hours as needed. Reported on 01/17/2016   Yes [provider]  ALPRAZolam (XANAX) 0.25 MG tablet TAKE 1 TABLET BY MOUTH TWICE DAILY AS NEEDED FOR ANXIETY 08/08/18  Yes [name], MD  apixaban (ELIQUIS) 2.5 MG TABS tablet  Take 2.5 mg by mouth 2 (two) times daily.   Yes [provider]  diltiazem (TIAZAC) 180 MG 24 hr capsule Take 180 mg by mouth daily.   Yes [provider]  finasteride (PROSCAR) 5 MG tablet Take 1 tablet (5 mg total) by mouth daily. 09/13/17  Yes [name], MD  flecainide (TAMBOCOR) 100 MG tablet Take 1 tablet (100 mg total) by mouth 2 (two) times daily. 12/13/14  Yes Deboraha Sprang, MD  Glucosamine 500 MG CAPS Take by mouth daily.     Yes [provider]  tamsulosin (FLOMAX) 0.4 MG CAPS capsule TAKE ONE CAPSULE BY MOUTH EVERY DAY 08/16/14  Yes [name], MD    Allergies as of 08/20/2018  . (No Known Allergies)    Family History  Problem Relation Age of Onset  . Hypertension Mother   . Alcohol abuse Father   . Bladder Cancer Neg Hx   . Prostate cancer Neg Hx   . Kidney cancer Neg Hx     Social History   Socioeconomic History  . Marital status: Married    Spouse name: Not on file  . Number of children: 3  . Years of education: Not on file  . Highest education level: Not on file  Occupational History  . Occupation: Retired    Fish farm manager: RETIRED    Comment: Dealer  Needs  . Financial resource strain: Not on file  . Food insecurity:    Worry: Not on file    Inability: Not on file  . Transportation needs:    Medical: Not on file    Non-medical: Not on file  Tobacco Use  . Smoking status: Former Research scientist (life sciences)  . Smokeless tobacco: Never Used  . Tobacco comment: In [place], quit in 1960's  Substance and Sexual Activity  . Alcohol use: No  . Drug use: No  . Sexual activity: Yes  Lifestyle  . Physical activity:    Days per week: Not on file    Minutes per session: Not on file  . Stress: Not on file  Relationships  . Social connections:    Talks on phone: Not on file    Gets together: Not on file    Attends religious service: Not on file    Active member of club or organization: Not on file    Attends meetings of clubs  or organizations: Not on file    Relationship status: Not on file  . Intimate partner violence:    Fear of current or ex partner: Not on file    Emotionally abused: Not on file    Physically abused: Not on file    Forced sexual activity: Not on file  Other Topics Concern  . Not on file  Social History Narrative   2 Daughters, one son killed in [place]      Has living will   Wife, then daughter Maudie Mercury, is health care POA   Would accept resuscitation attempts but no prolonged life support.   No tube feeding if cognitively unaware    Review of Systems: See HPI, otherwise negative ROS  Physical Exam: BP 134/64   Pulse 62   Temp (!) 96.2 F (35.7 C) (Tympanic)   Resp 16   Ht 5\' 11"  (1.803 m)   Wt 77.1 kg   SpO2 98%   BMI 23.71 kg/m  General:   Alert,  pleasant and cooperative in NAD Head:  Normocephalic and atraumatic. Neck:  Supple; no masses or thyromegaly. Lungs:  Clear throughout to auscultation.    Heart:  Regular rate and rhythm. Abdomen:  Soft, nontender and nondistended. Normal bowel sounds, without guarding, and without rebound.   Neurologic:  Alert and  oriented x4;  grossly normal neurologically.  Impression/Plan: [name] is here for an colonoscopy to be performed for h/o adenoma of colon  Risks, benefits, limitations, and alternatives regarding  colonoscopy have been reviewed with the patient.  Questions have been answered.  All parties agreeable.   Sherri Sear, MD  09/15/2018, 8:29 AM

## 2018-09-15 NOTE — Anesthesia Postprocedure Evaluation (Signed)
Anesthesia Post Note  Patient: [name]  Procedure(s) Performed: COLONOSCOPY WITH PROPOFOL (N/A )  Patient location during evaluation: Endoscopy Anesthesia Type: General Level of consciousness: awake and alert Pain management: pain level controlled Vital Signs Assessment: post-procedure vital signs reviewed and stable Respiratory status: spontaneous breathing, nonlabored ventilation, respiratory function stable and patient connected to nasal cannula oxygen Cardiovascular status: blood pressure returned to baseline and stable Postop Assessment: no apparent nausea or vomiting Anesthetic complications: no Comments: Patient was able to void before discharge      Last Vitals:  Vitals:   09/15/18 0745 09/15/18 0916  BP: 134/64 (!) 92/42  Pulse: 62 61  Resp: 16 12  Temp: (!) 35.7 C (!) 36.3 C  SpO2: 98% 98%    Last Pain:  Vitals:   09/15/18 0956  TempSrc:   PainSc: 0-No pain                 Precious Haws [place]

## 2018-09-15 NOTE — Op Note (Signed)
[facility] Gastroenterology Patient Name: [name] Procedure Date: 09/15/2018 8:28 AM MRN: [id number] Account #: [id number] Date of Birth: [birth year] Admit Type: Outpatient Age: 84 Room: [facility] ENDO ROOM 2 Gender: Male Note Status: Finalized Procedure:            Colonoscopy Indications:          Surveillance: Personal history of adenomatous polyps on                        last colonoscopy > 5 years ago Providers:            Lin Landsman MD, MD Referring MD:         [name] (Referring MD) Medicines:            Monitored Anesthesia Care Complications:        No immediate complications. Estimated blood loss: None. Procedure:            Pre-Anesthesia Assessment:                       - Prior to the procedure, a History and Physical was                        performed, and patient medications and allergies were                        reviewed. The patient is competent. The risks and                        benefits of the procedure and the sedation options and                        risks were discussed with the patient. All questions                        were answered and informed consent was obtained.                        Patient identification and proposed procedure were                        verified by the physician, the nurse, the                        anesthesiologist, the anesthetist and the technician in                        the pre-procedure area in the procedure room in the                        endoscopy suite. Mental Status Examination: alert and                        oriented. Airway Examination: normal oropharyngeal                        airway and neck mobility. Respiratory Examination:                        clear to auscultation. CV Examination: normal.  Prophylactic Antibiotics: The patient does not require                        prophylactic antibiotics. Prior Anticoagulants: The        patient has taken Eliquis (apixaban), last dose was 4                        days prior to procedure. ASA Grade Assessment: III - A                        patient with severe systemic disease. After reviewing                        the risks and benefits, the patient was deemed in                        satisfactory condition to undergo the procedure. The                        anesthesia plan was to use monitored anesthesia care                        (MAC). Immediately prior to administration of                        medications, the patient was re-assessed for adequacy                        to receive sedatives. The heart rate, respiratory rate,                        oxygen saturations, blood pressure, adequacy of                        pulmonary ventilation, and response to care were                        monitored throughout the procedure. The physical status                        of the patient was re-assessed after the procedure.                       After obtaining informed consent, the colonoscope was                        passed under direct vision. Throughout the procedure,                        the patient's blood pressure, pulse, and oxygen                        saturations were monitored continuously. The                        Colonoscope was introduced through the anus and                        advanced to the the cecum, identified by appendiceal  orifice and ileocecal valve. The colonoscopy was                        unusually difficult due to inadequate bowel prep and                        significant looping. Successful completion of the                        procedure was aided by changing the patient to a prone                        position, applying abdominal pressure and lavage. The                        patient tolerated the procedure well. The quality of                        the bowel preparation was evaluated using the  BBPS                        [facility] Bowel Preparation Scale) with scores of: Right                        Colon = 2 (minor amount of residual staining, small                        fragments of stool and/or opaque liquid, but mucosa                        seen well), Transverse Colon = 2 (minor amount of                        residual staining, small fragments of stool and/or                        opaque liquid, but mucosa seen well) and Left Colon = 2                        (minor amount of residual staining, small fragments of                        stool and/or opaque liquid, but mucosa seen well). The                        total BBPS score equals 6. Findings:      Hemorrhoids were found on perianal exam.      A diminutive polyp was found in the cecum. The polyp was sessile. The       polyp was removed with a cold biopsy forceps. Resection and retrieval       were complete.      Two sessile polyps were found in the transverse colon. The polyps were 5       mm in size. These polyps were removed with a cold snare. Resection and       retrieval were complete.      A 5 mm polyp was found in the ascending colon. The polyp was sessile.  The polyp was removed with a cold snare. Resection and retrieval were       complete.      A 5 mm polyp was found in the descending colon. The polyp was sessile.       The polyp was removed with a cold snare. Resection and retrieval were       complete.      A 7 mm polyp was found in the rectum. The polyp was sessile. The polyp       was removed with a hot snare. Resection and retrieval were complete.      Non-bleeding external and internal hemorrhoids were found during       retroflexion and during perianal exam. The hemorrhoids were large.      A diffuse area of moderate melanosis was found in the entire colon.      Multiple diverticula were found in the entire colon. There was no       evidence of diverticular bleeding. Impression:           -  Hemorrhoids found on perianal exam.                       - One diminutive polyp in the cecum, removed with a                        cold biopsy forceps. Resected and retrieved.                       - Two 5 mm polyps in the transverse colon, removed with                        a cold snare. Resected and retrieved.                       - One 5 mm polyp in the ascending colon, removed with a                        cold snare. Resected and retrieved.                       - One 5 mm polyp in the descending colon, removed with                        a cold snare. Resected and retrieved.                       - One 7 mm polyp in the rectum, removed with a hot                        snare. Resected and retrieved.                       - Non-bleeding external and internal hemorrhoids. Recommendation:       - Discharge patient to home (with spouse).                       - Cardiac diet.                       - Continue present medications.                       -  Await pathology results.                       - Resume Eliquis (apixaban) at prior dose tomorrow.                       - Repeat colonoscopy in 3 - 5 years for surveillance of                        multiple polyps.                       - Return to my office as previously scheduled. Procedure Code(s):    --- Professional ---                       [phone], Colonoscopy, flexible; with removal of tumor(s),                        polyp(s), or other lesion(s) by snare technique                       [id number], 77, Colonoscopy, flexible; with biopsy, single                        or multiple Diagnosis Code(s):    --- Professional ---                       Z86.010, Personal history of colonic polyps                       K64.8, Other hemorrhoids                       D12.0, Benign neoplasm of cecum                       D12.2, Benign neoplasm of ascending colon                       D12.4, Benign neoplasm of descending colon                        K62.1, Rectal polyp                       D12.3, Benign neoplasm of transverse colon (hepatic                        flexure or splenic flexure) CPT copyright 2018 American Medical Association. All rights reserved. The codes documented in this report are preliminary and upon coder review may  be revised to meet current compliance requirements. Dr. Ulyess Mort Lin Landsman MD, MD 09/15/2018 9:18:35 AM This report has been signed electronically. Number of Addenda: 0 Note Initiated On: 09/15/2018 8:28 AM Scope Withdrawal Time: 0 hours 22 minutes 3 seconds  Total Procedure Duration: 0 hours 36 minutes 43 seconds       [facility]

## 2018-09-15 NOTE — Anesthesia Post-op Follow-up Note (Signed)
Anesthesia QCDR form completed.        

## 2018-09-16 ENCOUNTER — Encounter: Payer: Self-pay | Admitting: Gastroenterology

## 2018-09-16 LAB — SURGICAL PATHOLOGY

## 2018-10-06 ENCOUNTER — Ambulatory Visit (INDEPENDENT_AMBULATORY_CARE_PROVIDER_SITE_OTHER): Payer: Medicare Other | Admitting: Internal Medicine

## 2018-10-06 ENCOUNTER — Encounter: Payer: Self-pay | Admitting: Internal Medicine

## 2018-10-06 VITALS — BP 126/78 | HR 81 | Temp 97.8°F | Wt 173.0 lb

## 2018-10-06 DIAGNOSIS — J3089 Other allergic rhinitis: Secondary | ICD-10-CM | POA: Diagnosis not present

## 2018-10-06 DIAGNOSIS — K5792 Diverticulitis of intestine, part unspecified, without perforation or abscess without bleeding: Secondary | ICD-10-CM | POA: Diagnosis not present

## 2018-10-06 MED ORDER — CIPROFLOXACIN HCL 250 MG PO TABS: 250.0000 mg | ORAL_TABLET | Freq: Two times a day (BID) | ORAL | 0 refills | Status: DC

## 2018-10-06 MED ORDER — METRONIDAZOLE 500 MG PO TABS: 500.0000 mg | ORAL_TABLET | Freq: Three times a day (TID) | ORAL | 0 refills | Status: DC

## 2018-10-06 NOTE — Patient Instructions (Signed)

## 2018-10-06 NOTE — Progress Notes (Signed)
HPI  Pt presents to the clinic today with c/o headache, runny nose, nasal congestion and cough. He reports this started 2 weeks ago. He reports the headache is located in the forehead. He describes the pain as pressure. The cough is productive of clear mucous. He denies ear pain, sore throat or shortness of breath. He denies fever, chills or body aches. He has not tried any medication OTC. He has a history of allergies. He has not had sick contacts that he is aware of.  He also reports generalized abdominal pain, bloating and constipation. He reports this started about 5 days ago. He describes the abdominal pain as crampy. He denies nausea and vomiting. He has not had a BM in 9 days. He has a history of chronic constipation but has also had diverticulitis in the past and reports this feels the same. He denies blood in his stool. He denies fever, chills or body aches. He has not taken anything OTC for these symptoms. Colonoscopy from 09/2018 reviewed.  Review of Systems     Past Medical History:  Diagnosis Date  . Acute diverticulitis 12/15   [facility]  . Acute diverticulitis   . Allergic rhinitis   . Atrial fibrillation or flutter 12/10   Has been paroxysmal. Patient has not tolerated coumadin due to headaches. ECHO (12/10) Mild LVH, EF 55%,   . BPH (benign prostatic hyperplasia)   . Chest pain 12/10   Lexiscan myoview with EF 55%, no ischemia or infarction  . Dysrhythmia    AFIB  . HOH (hard of hearing)   . Hyperlipemia   . Hypertension   . Osteoarthritis   . Pacemaker  medtronic 02/06/2010   Qualifier: Diagnosis of  By: Lovena Le, MD, [facility], Binnie Kand   . Presence of permanent cardiac pacemaker   . Sick sinus syndrome (HCC)    S/P dual chamber Medtronic PCM    Family History  Problem Relation Age of Onset  . Hypertension Mother   . Alcohol abuse Father   . Bladder Cancer Neg Hx   . Prostate cancer Neg Hx   . Kidney cancer Neg Hx     Social History   Socioeconomic History  .  Marital status: Married    Spouse name: Not on file  . Number of children: 3  . Years of education: Not on file  . Highest education level: Not on file  Occupational History  . Occupation: Retired    Fish farm manager: RETIRED    Comment: Arboriculturist  . Financial resource strain: Not on file  . Food insecurity:    Worry: Not on file    Inability: Not on file  . Transportation needs:    Medical: Not on file    Non-medical: Not on file  Tobacco Use  . Smoking status: Former Research scientist (life sciences)  . Smokeless tobacco: Never Used  . Tobacco comment: In [place], quit in 1960's  Substance and Sexual Activity  . Alcohol use: No  . Drug use: No  . Sexual activity: Yes  Lifestyle  . Physical activity:    Days per week: Not on file    Minutes per session: Not on file  . Stress: Not on file  Relationships  . Social connections:    Talks on phone: Not on file    Gets together: Not on file    Attends religious service: Not on file    Active member of club or organization: Not on file    Attends meetings of clubs  or organizations: Not on file    Relationship status: Not on file  . Intimate partner violence:    Fear of current or ex partner: Not on file    Emotionally abused: Not on file    Physically abused: Not on file    Forced sexual activity: Not on file  Other Topics Concern  . Not on file  Social History Narrative   2 Daughters, one son killed in [place]      Has living will   Wife, then daughter Maudie Mercury, is health care POA   Would accept resuscitation attempts but no prolonged life support.   No tube feeding if cognitively unaware    No Known Allergies   Constitutional: Positive headache Denies fatigue, fever or abrupt weight changes.  HEENT:  Positive runny nose, nasal congestion. Denies eye redness, ear pain, ringing in the ears, wax buildup or sore throat. Respiratory: Positive cough. Denies difficulty breathing or shortness of breath.  Cardiovascular: Denies chest pain,  chest tightness, palpitations or swelling in the hands or feet.  Gastrointestinal: Positive abdominal pain, bloating and constipation. Denies nausea, vomiting, diarrhea or blood in stool.  No other specific complaints in a complete review of systems (except as listed in HPI above).  Objective:   BP 126/78   Pulse 81   Temp 97.8 F (36.6 C) (Oral)   Wt 173 lb (78.5 kg)   SpO2 97%   BMI 24.13 kg/m   General: Appears his stated age, in NAD. HEENT: Head: normal shape and size, no sinus tenderness noted;  Nose: mucosa boggy and moist, turbinates swollen; Throat/Mouth: + PND. Teeth present, mucosa pink and moist, no exudate noted, no lesions or ulcerations noted.  Neck:  No adenopathy noted.  Cardiovascular: Normal rate and rhythm. S1,S2 noted.  No murmur, rubs or gallops noted.  Pulmonary/Chest: Normal effort and positive vesicular breath sounds. No respiratory distress. No wheezes, rales or ronchi noted.  Abdomen: Soft, generally tender. Normal bowels sounds. No distention or masses noted.     Assessment & Plan:   Allergic Rhinitis  Start Claritin daily 5-7 days Flonase 2 sprays each nostril for 3 days and then as needed. No indication for abx at this time  Acute Diverticulitis:  RX for Cipro 250 mg BID x 7 days RX for Metronidazole 500 mg TID x 7 days Clear liquid diet, advance as tolerated  Return precautions disucssed Webb Silversmith, NP   RTC as needed or if symptoms persist. Webb Silversmith, NP

## 2018-10-07 ENCOUNTER — Emergency Department: Payer: Medicare Other

## 2018-10-07 ENCOUNTER — Other Ambulatory Visit: Payer: Self-pay

## 2018-10-07 ENCOUNTER — Encounter: Payer: Self-pay | Admitting: Emergency Medicine

## 2018-10-07 ENCOUNTER — Encounter
Admission: EM | Disposition: A | Discharge status: Home / Self Care | Payer: Self-pay | Source: Home / Self Care | Attending: Internal Medicine

## 2018-10-07 ENCOUNTER — Inpatient Hospital Stay: Payer: Medicare Other | Admitting: Certified Registered"

## 2018-10-07 ENCOUNTER — Inpatient Hospital Stay
Admission: EM | Admit: 2018-10-07 | Discharge: 2018-10-16 | DRG: 330 | Disposition: A | Discharge status: Home / Self Care | Payer: Medicare Other | Attending: Surgery | Admitting: Surgery

## 2018-10-07 DIAGNOSIS — E876 Hypokalemia: Secondary | ICD-10-CM | POA: Diagnosis not present

## 2018-10-07 DIAGNOSIS — Z950 Presence of cardiac pacemaker: Secondary | ICD-10-CM | POA: Diagnosis not present

## 2018-10-07 DIAGNOSIS — E785 Hyperlipidemia, unspecified: Secondary | ICD-10-CM | POA: Diagnosis present

## 2018-10-07 DIAGNOSIS — K562 Volvulus: Secondary | ICD-10-CM | POA: Diagnosis not present

## 2018-10-07 DIAGNOSIS — K660 Peritoneal adhesions (postprocedural) (postinfection): Secondary | ICD-10-CM | POA: Diagnosis present

## 2018-10-07 DIAGNOSIS — M1990 Unspecified osteoarthritis, unspecified site: Secondary | ICD-10-CM | POA: Diagnosis present

## 2018-10-07 DIAGNOSIS — Z87891 Personal history of nicotine dependence: Secondary | ICD-10-CM

## 2018-10-07 DIAGNOSIS — F419 Anxiety disorder, unspecified: Secondary | ICD-10-CM | POA: Diagnosis present

## 2018-10-07 DIAGNOSIS — Z7901 Long term (current) use of anticoagulants: Secondary | ICD-10-CM | POA: Diagnosis not present

## 2018-10-07 DIAGNOSIS — Z79899 Other long term (current) drug therapy: Secondary | ICD-10-CM

## 2018-10-07 DIAGNOSIS — I10 Essential (primary) hypertension: Secondary | ICD-10-CM | POA: Diagnosis present

## 2018-10-07 DIAGNOSIS — K6389 Other specified diseases of intestine: Secondary | ICD-10-CM

## 2018-10-07 DIAGNOSIS — I480 Paroxysmal atrial fibrillation: Secondary | ICD-10-CM | POA: Diagnosis not present

## 2018-10-07 DIAGNOSIS — H9190 Unspecified hearing loss, unspecified ear: Secondary | ICD-10-CM | POA: Diagnosis present

## 2018-10-07 DIAGNOSIS — Z01818 Encounter for other preprocedural examination: Secondary | ICD-10-CM | POA: Diagnosis not present

## 2018-10-07 DIAGNOSIS — R140 Abdominal distension (gaseous): Secondary | ICD-10-CM | POA: Diagnosis not present

## 2018-10-07 DIAGNOSIS — K5900 Constipation, unspecified: Secondary | ICD-10-CM | POA: Diagnosis not present

## 2018-10-07 DIAGNOSIS — I482 Chronic atrial fibrillation, unspecified: Secondary | ICD-10-CM | POA: Diagnosis present

## 2018-10-07 DIAGNOSIS — N400 Enlarged prostate without lower urinary tract symptoms: Secondary | ICD-10-CM | POA: Diagnosis present

## 2018-10-07 DIAGNOSIS — I4891 Unspecified atrial fibrillation: Secondary | ICD-10-CM | POA: Diagnosis not present

## 2018-10-07 DIAGNOSIS — K219 Gastro-esophageal reflux disease without esophagitis: Secondary | ICD-10-CM | POA: Diagnosis present

## 2018-10-07 DIAGNOSIS — R109 Unspecified abdominal pain: Secondary | ICD-10-CM | POA: Diagnosis present

## 2018-10-07 HISTORY — PX: FLEXIBLE SIGMOIDOSCOPY: SHX5431

## 2018-10-07 LAB — BASIC METABOLIC PANEL
Anion gap: 9 (ref 5–15)
BUN: 20 mg/dL (ref 8–23)
CO2: 23 mmol/L (ref 22–32)
Calcium: 9.2 mg/dL (ref 8.9–10.3)
Chloride: 107 mmol/L (ref 98–111)
Creatinine, Ser: 1.08 mg/dL (ref 0.61–1.24)
GFR calc Af Amer: >60 mL/min (ref >60)
GFR calc non Af Amer: >60 mL/min (ref >60)
GLUCOSE: 103 mg/dL — AB (ref 70–99)
Potassium: 3.7 mmol/L (ref 3.5–5.1)
Sodium: 139 mmol/L (ref 135–145)

## 2018-10-07 LAB — CBC WITH DIFFERENTIAL/PLATELET
Abs Immature Granulocytes: 0.01 10*3/uL (ref 0.00–0.07)
Basophils Absolute: 0 10*3/uL (ref 0.0–0.1)
Basophils Relative: 1 %
Eosinophils Absolute: 0 10*3/uL (ref 0.0–0.5)
Eosinophils Relative: 1 %
HCT: 41.8 % (ref 39.0–52.0)
Hemoglobin: 14.2 g/dL (ref 13.0–17.0)
Immature Granulocytes: 0 %
Lymphocytes Relative: 30 %
Lymphs Abs: 1.6 10*3/uL (ref 0.7–4.0)
MCH: 30.9 pg (ref 26.0–34.0)
MCHC: 34 g/dL (ref 30.0–36.0)
MCV: 91.1 fL (ref 80.0–100.0)
MONOS PCT: 9 %
Monocytes Absolute: 0.5 10*3/uL (ref 0.1–1.0)
NEUTROS PCT: 59 %
Neutro Abs: 3.2 10*3/uL (ref 1.7–7.7)
Platelets: 243 10*3/uL (ref 150–400)
RBC: 4.59 MIL/uL (ref 4.22–5.81)
RDW: 12.6 % (ref 11.5–15.5)
WBC: 5.3 10*3/uL (ref 4.0–10.5)
nRBC: 0 % (ref 0.0–0.2)

## 2018-10-07 LAB — URINALYSIS, COMPLETE (UACMP) WITH MICROSCOPIC
Bacteria, UA: NONE SEEN
Bilirubin Urine: NEGATIVE
Glucose, UA: NEGATIVE mg/dL
Hgb urine dipstick: NEGATIVE
Ketones, ur: NEGATIVE mg/dL
Leukocytes, UA: NEGATIVE
Nitrite: NEGATIVE
Protein, ur: NEGATIVE mg/dL
Specific Gravity, Urine: >1.046 — ABNORMAL HIGH (ref 1.005–1.030)
pH: 5 (ref 5.0–8.0)

## 2018-10-07 LAB — HEPATIC FUNCTION PANEL
ALT: 11 U/L (ref 0–44)
AST: 17 U/L (ref 15–41)
Albumin: 4.2 g/dL (ref 3.5–5.0)
Alkaline Phosphatase: 57 U/L (ref 38–126)
BILIRUBIN DIRECT: 0.1 mg/dL (ref 0.0–0.2)
Indirect Bilirubin: 1 mg/dL — ABNORMAL HIGH (ref 0.3–0.9)
Total Bilirubin: 1.1 mg/dL (ref 0.3–1.2)
Total Protein: 8.2 g/dL — ABNORMAL HIGH (ref 6.5–8.1)

## 2018-10-07 LAB — LIPASE, BLOOD: Lipase: 30 U/L (ref 11–51)

## 2018-10-07 SURGERY — SIGMOIDOSCOPY, FLEXIBLE
Anesthesia: General

## 2018-10-07 MED ORDER — APIXABAN 2.5 MG PO TABS
2.5000 mg | ORAL_TABLET | Freq: Two times a day (BID) | ORAL | Status: DC
  Administered 2018-10-07 – 2018-10-08 (×2): 2.5 mg via ORAL
  Filled 2018-10-07 (×2): qty 1

## 2018-10-07 MED ORDER — DILTIAZEM HCL ER COATED BEADS 180 MG PO CP24
180.0000 mg | ORAL_CAPSULE | Freq: Every day | ORAL | Status: DC
  Administered 2018-10-07: 180 mg via ORAL
  Filled 2018-10-07 (×2): qty 1

## 2018-10-07 MED ORDER — ACETAMINOPHEN 325 MG PO TABS
650.0000 mg | ORAL_TABLET | Freq: Four times a day (QID) | ORAL | Status: DC | PRN
  Administered 2018-10-09: 650 mg via ORAL
  Filled 2018-10-07 (×2): qty 2

## 2018-10-07 MED ORDER — SODIUM CHLORIDE 0.9 % IV SOLN
INTRAVENOUS | Status: DC
  Administered 2018-10-07: 14:00:00 via INTRAVENOUS

## 2018-10-07 MED ORDER — SODIUM CHLORIDE 0.9 % IV SOLN: INTRAVENOUS | Status: DC

## 2018-10-07 MED ORDER — PROPOFOL 500 MG/50ML IV EMUL
INTRAVENOUS | Status: DC | PRN
  Administered 2018-10-07: 150 ug/kg/min via INTRAVENOUS

## 2018-10-07 MED ORDER — ACETAMINOPHEN 650 MG RE SUPP: 650.0000 mg | Freq: Four times a day (QID) | RECTAL | Status: DC | PRN

## 2018-10-07 MED ORDER — ONDANSETRON HCL 4 MG/2ML IJ SOLN
4.0000 mg | Freq: Once | INTRAMUSCULAR | Status: AC
  Administered 2018-10-07: 4 mg via INTRAVENOUS
  Filled 2018-10-07: qty 2

## 2018-10-07 MED ORDER — PROPOFOL 10 MG/ML IV BOLUS
INTRAVENOUS | Status: AC
  Filled 2018-10-07: qty 20

## 2018-10-07 MED ORDER — FINASTERIDE 5 MG PO TABS
5.0000 mg | ORAL_TABLET | Freq: Every day | ORAL | Status: DC
  Administered 2018-10-07 – 2018-10-16 (×9): 5 mg via ORAL
  Filled 2018-10-07 (×9): qty 1

## 2018-10-07 MED ORDER — PROPOFOL 10 MG/ML IV BOLUS
INTRAVENOUS | Status: DC | PRN
  Administered 2018-10-07: 40 mg via INTRAVENOUS

## 2018-10-07 MED ORDER — TAMSULOSIN HCL 0.4 MG PO CAPS
0.4000 mg | ORAL_CAPSULE | Freq: Every day | ORAL | Status: DC
  Administered 2018-10-07: 0.4 mg via ORAL
  Filled 2018-10-07 (×2): qty 1

## 2018-10-07 MED ORDER — ALPRAZOLAM 0.25 MG PO TABS: 0.2500 mg | ORAL_TABLET | Freq: Two times a day (BID) | ORAL | Status: DC | PRN

## 2018-10-07 MED ORDER — ONDANSETRON HCL 4 MG/2ML IJ SOLN
4.0000 mg | Freq: Four times a day (QID) | INTRAMUSCULAR | Status: DC | PRN
  Administered 2018-10-15: 4 mg via INTRAVENOUS
  Filled 2018-10-07: qty 2

## 2018-10-07 MED ORDER — ONDANSETRON HCL 4 MG PO TABS: 4.0000 mg | ORAL_TABLET | Freq: Four times a day (QID) | ORAL | Status: DC | PRN

## 2018-10-07 MED ORDER — IOPAMIDOL (ISOVUE-300) INJECTION 61%
100.0000 mL | Freq: Once | INTRAVENOUS | Status: AC | PRN
  Administered 2018-10-07: 100 mL via INTRAVENOUS

## 2018-10-07 MED ORDER — FLEET ENEMA 7-19 GM/118ML RE ENEM: 1.0000 | ENEMA | Freq: Once | RECTAL | Status: DC

## 2018-10-07 MED ORDER — IOPAMIDOL (ISOVUE-300) INJECTION 61%
30.0000 mL | Freq: Once | INTRAVENOUS | Status: AC | PRN
  Administered 2018-10-07: 30 mL via ORAL

## 2018-10-07 MED ORDER — FLECAINIDE ACETATE 100 MG PO TABS
100.0000 mg | ORAL_TABLET | Freq: Two times a day (BID) | ORAL | Status: DC
  Administered 2018-10-07 – 2018-10-16 (×16): 100 mg via ORAL
  Filled 2018-10-07 (×20): qty 1

## 2018-10-07 NOTE — H&P (Signed)
[place] [facility] [place] NAME: [name]    MR#:  [id number]  DATE OF BIRTH:  [birth year]  DATE OF ADMISSION:  10/07/2018  PRIMARY CARE PHYSICIAN: [name], MD   REQUESTING/REFERRING PHYSICIAN: Arta Silence, MD  CHIEF COMPLAINT:   Chief Complaint  Patient presents with  . Constipation  . Abdominal Pain    HISTORY OF PRESENT ILLNESS: [name]  is a 84 y.o. male with a known history of atrial fibrillation, BPH, hyperlipidemia, hypertension, osteoarthritis who is presenting with constipation for 9 days as well as abdominal pain.  Patient denies any nausea vomiting.  He reports that he does pass some liquid stool.  No fevers no chills   PAST MEDICAL HISTORY:   Past Medical History:  Diagnosis Date  . Acute diverticulitis 12/15   [facility]  . Acute diverticulitis   . Allergic rhinitis   . Atrial fibrillation or flutter 12/10   Has been paroxysmal. Patient has not tolerated coumadin due to headaches. ECHO (12/10) Mild LVH, EF 55%,   . BPH (benign prostatic hyperplasia)   . Chest pain 12/10   Lexiscan myoview with EF 55%, no ischemia or infarction  . Dysrhythmia    AFIB  . HOH (hard of hearing)   . Hyperlipemia   . Hypertension   . Osteoarthritis   . Pacemaker  medtronic 02/06/2010   Qualifier: Diagnosis of  By: Lovena Le, MD, [facility], Binnie Kand   . Presence of permanent cardiac pacemaker   . Sick sinus syndrome (HCC)    S/P dual chamber Medtronic PCM    PAST SURGICAL HISTORY:  Past Surgical History:  Procedure Laterality Date  . CATARACT EXTRACTION W/PHACO Left 01/16/2017   Procedure: CATARACT EXTRACTION PHACO AND INTRAOCULAR LENS PLACEMENT (IOC);  Surgeon: Estill Cotta, MD;  Location: [facility] ORS;  Service: Ophthalmology;  Laterality: Left;  [place] 1:51.3 AP% 25.9 CDE 54.80 FLUID PACK LOT # [id number]  . COLONOSCOPY WITH PROPOFOL N/A 09/15/2018   Procedure: COLONOSCOPY WITH PROPOFOL;  Surgeon: Lin Landsman, MD;   Location: [facility] ENDOSCOPY;  Service: Gastroenterology;  Laterality: N/A;  . dual chamber pacemaker  01/11   Dr. Lovena Le  . LAPAROSCOPIC CHOLECYSTECTOMY  1/14   Dr Bary Castilla  . VASECTOMY  in his 66's    SOCIAL HISTORY:  Social History   Tobacco Use  . Smoking status: Former Research scientist (life sciences)  . Smokeless tobacco: Never Used  . Tobacco comment: In [place], quit in 1960's  Substance Use Topics  . Alcohol use: No    FAMILY HISTORY:  Family History  Problem Relation Age of Onset  . Hypertension Mother   . Alcohol abuse Father   . Bladder Cancer Neg Hx   . Prostate cancer Neg Hx   . Kidney cancer Neg Hx     DRUG ALLERGIES: No Known Allergies  REVIEW OF SYSTEMS:   CONSTITUTIONAL: No fever, fatigue or weakness.  EYES: No blurred or double vision.  EARS, NOSE, AND THROAT: No tinnitus or ear pain.  RESPIRATORY: No cough, shortness of breath, wheezing or hemoptysis.  CARDIOVASCULAR: No chest pain, orthopnea, edema.  GASTROINTESTINAL: No nausea, vomiting, diarrhea or positive abdominal pain.  GENITOURINARY: No dysuria, hematuria.  ENDOCRINE: No polyuria, nocturia,  HEMATOLOGY: No anemia, easy bruising or bleeding SKIN: No rash or lesion. MUSCULOSKELETAL: No joint pain or arthritis.   NEUROLOGIC: No tingling, numbness, weakness.  PSYCHIATRY: No anxiety or depression.   MEDICATIONS AT HOME:  Prior to Admission medications   Medication Sig Start Date  End Date Taking? Authorizing Provider  acetaminophen (TYLENOL) 325 MG tablet Take 650 mg by mouth every 6 (six) hours as needed. Reported on 01/17/2016    [provider]  ALPRAZolam Duanne Moron) 0.25 MG tablet TAKE 1 TABLET BY MOUTH TWICE DAILY AS NEEDED FOR ANXIETY 08/08/18   [name], MD  apixaban (ELIQUIS) 2.5 MG TABS tablet Take 2.5 mg by mouth 2 (two) times daily.    [provider]  ciprofloxacin (CIPRO) 250 MG tablet Take 1 tablet (250 mg total) by mouth 2 (two) times daily. 10/06/18   Jearld Fenton, NP  diltiazem  (TIAZAC) 180 MG 24 hr capsule Take 180 mg by mouth daily.    [provider]  finasteride (PROSCAR) 5 MG tablet Take 1 tablet (5 mg total) by mouth daily. 09/13/17   [name], MD  flecainide (TAMBOCOR) 100 MG tablet Take 1 tablet (100 mg total) by mouth 2 (two) times daily. 12/13/14   Deboraha Sprang, MD  Glucosamine 500 MG CAPS Take by mouth daily.      [provider]  metroNIDAZOLE (FLAGYL) 500 MG tablet Take 1 tablet (500 mg total) by mouth 3 (three) times daily. 10/06/18   Jearld Fenton, NP  tamsulosin (FLOMAX) 0.4 MG CAPS capsule TAKE ONE CAPSULE BY MOUTH EVERY DAY 08/16/14   [name], MD      PHYSICAL EXAMINATION:   VITAL SIGNS: Blood pressure 136/82, pulse 70, temperature 97.8 F (36.6 C), temperature source Oral, resp. rate 18, height 5\' 11"  (1.803 m), weight 78 kg, SpO2 97 %.  GENERAL:  84 y.o.-year-old patient lying in the bed with no acute distress.  EYES: Pupils equal, round, reactive to light and accommodation. No scleral icterus. Extraocular muscles intact.  HEENT: Head atraumatic, normocephalic. Oropharynx and nasopharynx clear.  NECK:  Supple, no jugular venous distention. No thyroid enlargement, no tenderness.  LUNGS: Normal breath sounds bilaterally, no wheezing, rales,rhonchi or crepitation. No use of accessory muscles of respiration.  CARDIOVASCULAR: S1, S2 normal. No murmurs, rubs, or gallops.  ABDOMEN: Soft, nontender, nondistended. Bowel sounds diminished no organomegaly or mass.  EXTREMITIES: No pedal edema, cyanosis, or clubbing.  NEUROLOGIC: Cranial nerves II through XII are intact. Muscle strength 5/5 in all extremities. Sensation intact. Gait not checked.  PSYCHIATRIC: The patient is alert and oriented x 3.  SKIN: No obvious rash, lesion, or ulcer.   LABORATORY PANEL:   CBC Recent Labs  Lab 10/07/18 0946  WBC 5.3  HGB 14.2  HCT 41.8  PLT 243  MCV 91.1  MCH 30.9  MCHC 34.0  RDW 12.6  LYMPHSABS 1.6  MONOABS 0.5   EOSABS 0.0  BASOSABS 0.0   ------------------------------------------------------------------------------------------------------------------  Chemistries  Recent Labs  Lab 10/07/18 0946  NA 139  K 3.7  CL 107  CO2 23  GLUCOSE 103*  BUN 20  CREATININE 1.08  CALCIUM 9.2  AST 17  ALT 11  ALKPHOS 57  BILITOT 1.1   ------------------------------------------------------------------------------------------------------------------ estimated creatinine clearance is 55.2 mL/min (by C-G formula based on SCr of 1.08 mg/dL). ------------------------------------------------------------------------------------------------------------------ No results for input(s): TSH, T4TOTAL, T3FREE, THYROIDAB in the last 72 hours.  Invalid input(s): FREET3   Coagulation profile No results for input(s): INR, PROTIME in the last 168 hours. ------------------------------------------------------------------------------------------------------------------- No results for input(s): DDIMER in the last 72 hours. -------------------------------------------------------------------------------------------------------------------  Cardiac Enzymes No results for input(s): CKMB, TROPONINI, MYOGLOBIN in the last 168 hours.  Invalid input(s): CK ------------------------------------------------------------------------------------------------------------------ Invalid input(s): POCBNP  ---------------------------------------------------------------------------------------------------------------  Urinalysis  Component Value Date/Time   COLORURINE YELLOW (A) 06/22/2017 0441   APPEARANCEUR CLEAR (A) 06/22/2017 0441   APPEARANCEUR Hazy (A) 01/17/2016 1012   LABSPEC 1.021 06/22/2017 0441   LABSPEC 1.024 10/05/2014 1418   PHURINE 5.0 06/22/2017 0441   GLUCOSEU NEGATIVE 06/22/2017 0441   GLUCOSEU Negative 10/05/2014 1418   HGBUR NEGATIVE 06/22/2017 0441   BILIRUBINUR neg 10/17/2017 1355   BILIRUBINUR  Negative 01/17/2016 1012   BILIRUBINUR Negative 10/05/2014 1418   KETONESUR NEGATIVE 06/22/2017 0441   PROTEINUR trace 10/17/2017 1355   PROTEINUR 30 (A) 06/22/2017 0441   UROBILINOGEN 0.2 10/17/2017 1355   NITRITE neg 10/17/2017 1355   NITRITE NEGATIVE 06/22/2017 0441   LEUKOCYTESUR Negative 10/17/2017 1355   LEUKOCYTESUR Trace (A) 01/17/2016 1012   LEUKOCYTESUR Negative 10/05/2014 1418     RADIOLOGY: Ct Abdomen Pelvis W Contrast  Result Date: 10/07/2018 CLINICAL DATA:  Acute generalized abdominal pain. EXAM: CT ABDOMEN AND PELVIS WITH CONTRAST TECHNIQUE: Multidetector CT imaging of the abdomen and pelvis was performed using the standard protocol following bolus administration of intravenous contrast. CONTRAST:  182mL ISOVUE-300 IOPAMIDOL (ISOVUE-300) INJECTION 61% COMPARISON:  CT scan of June 22, 2017. FINDINGS: Lower chest: No acute abnormality. Hepatobiliary: Status post cholecystectomy. No biliary dilatation is noted. Stable appendix cysts are noted. Pancreas: Unremarkable. No pancreatic ductal dilatation or surrounding inflammatory changes. Spleen: Normal in size without focal abnormality. Adrenals/Urinary Tract: Adrenal glands are unremarkable. Kidneys are normal, without renal calculi, focal lesion, or hydronephrosis. Bladder is unremarkable. Stomach/Bowel: The stomach appears normal. The appendix is not visualized. No small bowel dilatation is noted. However, there appears to be a volvulus involving the sigmoid colon with dilatation of the more proximal colon loops. Vascular/Lymphatic: Aortic atherosclerosis. No enlarged abdominal or pelvic lymph nodes. Reproductive: Stable moderate prostatic enlargement is noted. Other: Stable right inguinal hernia is noted which contains a loop of small bowel, but does not result in incarceration or obstruction. Musculoskeletal: Severe multilevel degenerative disc disease is noted in the lumbar spine. No acute osseous abnormality is noted.  IMPRESSION: There appears to be twisting of the sigmoid colon concerning for volvulus resulting in mild dilatation obstruction of the more proximal colon. Sigmoidoscopy is recommended for further evaluation. Critical Value/emergent results were called by telephone at the time of interpretation on 10/07/2018 at 10:58 am to Dr. Arta Silence , who verbally acknowledged these results. Stable moderate prostatic enlargement. Aortic Atherosclerosis (ICD10-I70.0). Electronically Signed   By: Marijo Conception, M.D.   On: 10/07/2018 10:58   Dg Abd 2 Views  Result Date: 10/07/2018 CLINICAL DATA:  No bowel movement in 9 days according to the patient, abdominal pain and bloating, history of diverticulitis EXAM: ABDOMEN - 2 VIEW COMPARISON:  CT abdomen pelvis of 06/22/2017 FINDINGS: Both large and small bowel gas is present without significant distension. Slight gaseous distention of the right colon is noted of questionable significance. There is no evidence of bowel obstruction currently. Permanent pacemaker wires remain. No opaque calculus is seen. There are degenerative changes in the lumbar spine. Surgical clips are present in the right upper quadrant from prior cholecystectomy. IMPRESSION: 1. Slight gaseous distention of the right colon, but no definite evidence of bowel obstruction is noted. 2. Diffuse degenerative change throughout the lumbar spine. Electronically Signed   By: Ivar Drape M.D.   On: 10/07/2018 09:03    EKG: Orders placed or performed in visit on 10/30/16  . EKG 12-Lead    IMPRESSION AND PLAN: Patient is a 84 year old presenting with abdominal pain  1.  Sigmoid volvulus keep n.p.o. Plan for endoscopy by GI later today  2.  Atrial fibrillation hold his oral medications for now including Eliquis Resume once able to take p.o. post procedure  3.  BPH resume  Proscar and flomax  4. Anxiety continue Xanax  5.  Miscellaneous resume all Eliquis postprocedure   All the records are  reviewed and case discussed with ED provider. Management plans discussed with the patient, family and they are in agreement.  CODE STATUS:full code    TOTAL TIME TAKING CARE OF THIS PATIENT: 55 minutes.    Dustin Flock M.D on 10/07/2018 at 11:30 AM  Between 7am to 6pm - Pager - [phone]  After 6pm go to [email or web address] - password [email or web address]  [facility]  [phone]  CC: Primary care physician; [name], MD

## 2018-10-07 NOTE — ED Notes (Signed)
Pt denies any nausea or vomiting at this time

## 2018-10-07 NOTE — Op Note (Addendum)
[facility] Patient Name: [name] Procedure Date: 10/07/2018 1:33 PM MRN: [id number] Account #: [id number] Date of Birth: 05-24-1933 Admit Type: Outpatient Age: 84 Room: [facility] ENDO ROOM 4 Gender: Male Note Status: Finalized Procedure:            Flexible Sigmoidoscopy Indications:          For therapy of volvulus Providers:            Juan Smarr B. Bonna Gains MD, MD Referring MD:         [name] (Referring MD) Medicines:            Monitored Anesthesia Care Complications:        No immediate complications. Procedure:            Pre-Anesthesia Assessment:                       - Prior to the procedure, a History and Physical was                        performed, and patient medications and allergies were                        reviewed. The patient is competent. The risks and                        benefits of the procedure and the sedation options and                        risks were discussed with the patient. All questions                        were answered and informed consent was obtained.                        Patient identification and proposed procedure were                        verified by the physician, the nurse, the                        anesthesiologist, the anesthetist and the technician in                        the pre-procedure area in the procedure room in the                        endoscopy suite. Mental Status Examination: alert and                        oriented. Airway Examination: normal oropharyngeal                        airway and neck mobility. Respiratory Examination:                        clear to auscultation. CV Examination: normal.                        Prophylactic Antibiotics: The patient does not require  prophylactic antibiotics. Prior Anticoagulants: The                        patient has taken Eliquis (apixaban). ASA Grade                        Assessment: II - A  patient with mild systemic disease.                        After reviewing the risks and benefits, the patient was                        deemed in satisfactory condition to undergo the                        procedure. The anesthesia plan was to use monitored                        anesthesia care (MAC). Immediately prior to                        administration of medications, the patient was                        re-assessed for adequacy to receive sedatives. The                        heart rate, respiratory rate, oxygen saturations, blood                        pressure, adequacy of pulmonary ventilation, and                        response to care were monitored throughout the                        procedure. The physical status of the patient was                        re-assessed after the procedure.                       After obtaining informed consent, the scope was passed                        under direct vision. The Colonoscope was introduced                        through the anus and advanced to the the descending                        colon. The flexible sigmoidoscopy was accomplished with                        ease. The patient tolerated the procedure well. The                        quality of the bowel preparation was poor. Findings:      The perianal and digital rectal examinations were normal.      A probable  volvulus with viable appearing mucosa was found in the       sigmoid colon. Decompression of the volvulus was attempted and was       successful, with complete decompression achieved.      The abdominal exam after the decompression revealed the abdomen to be       much softer than prior to the procedure, consistent with treatment of       volvulus. Pt also reported feeling much better as far as abdominal       distension and bloating after the procedure. Impression:           - Preparation of the colon was poor.                       - Probable volvulus.  Successful complete decompression                        achieved.                       - No specimens collected. Recommendation:       - Obtain Abdominal X-ray tomorrow morning to assess                        bowel gas pattern.                       - Return patient to hospital ward for ongoing care.                       - Advance diet as tolerated.                       - Observe clinical course. Observe for flatulence and                        bowel movements.                       - If pt has recurrence of volvulus he will need                        surgical evaluation                       - Encourage ambulation as tolerated                       - Follow up with Dr. Marius Chan in clinic in 2 weeks                       - The findings and recommendations were discussed with                        the patient.                       - The findings and recommendations were discussed with                        the patient's family. Procedure Code(s):    --- Professional ---  228 100 1430, Sigmoidoscopy, flexible; with decompression (for                        pathologic distention) (eg, volvulus, megacolon),                        including placement of decompression tube, when                        performed Diagnosis Code(s):    --- Professional ---                       K56.2, Volvulus CPT copyright 2018 American Medical Association. All rights reserved. The codes documented in this report are preliminary and upon coder review may  be revised to meet current compliance requirements.  Juan [place], MD Juan Sidle B. Bonna Gains MD, MD 10/07/2018 2:00:02 PM This report has been signed electronically. Number of Addenda: 0 Note Initiated On: 10/07/2018 1:33 PM Total Procedure Duration: 0 hours 11 minutes 37 seconds       [facility]

## 2018-10-07 NOTE — Anesthesia Postprocedure Evaluation (Signed)
Anesthesia Post Note  Patient: [name]  Procedure(s) Performed: FLEXIBLE SIGMOIDOSCOPY (N/A )  Patient location during evaluation: Endoscopy Anesthesia Type: General Level of consciousness: awake and alert and oriented Pain management: pain level controlled Vital Signs Assessment: post-procedure vital signs reviewed and stable Respiratory status: spontaneous breathing, nonlabored ventilation and respiratory function stable Cardiovascular status: blood pressure returned to baseline and stable Postop Assessment: no signs of nausea or vomiting Anesthetic complications: no     Last Vitals:  Vitals:   10/07/18 1415 10/07/18 1425  BP: (!) 142/85 (!) 169/83  Pulse: 61 (!) 59  Resp: 13 10  Temp:    SpO2: 98% 100%    Last Pain:  Vitals:   10/07/18 1425  TempSrc:   PainSc: 0-No pain                 Samuel Rittenhouse

## 2018-10-07 NOTE — Transfer of Care (Signed)
Immediate Anesthesia Transfer of Care Note  Patient: [name]  Procedure(s) Performed: FLEXIBLE SIGMOIDOSCOPY (N/A )  Patient Location: Endoscopy Unit  Anesthesia Type:General  Level of Consciousness: awake, alert  and oriented  Airway & Oxygen Therapy: Patient Spontanous Breathing and Patient connected to nasal cannula oxygen  Post-op Assessment: Report given to RN and Post -op Vital signs reviewed and stable  Post vital signs: Reviewed and stable  Last Vitals:  Vitals Value Taken Time  BP    Temp    Pulse    Resp    SpO2      Last Pain:  Vitals:   10/07/18 1309  TempSrc:   PainSc: 4          Complications: No apparent anesthesia complications

## 2018-10-07 NOTE — ED Notes (Signed)
Patient reports endo md at bedside signed consent for procedure.

## 2018-10-07 NOTE — OR Nursing (Signed)
Post-op called to floor to speak with nurse,Josh Simser,R.N.Pt.is alert,drinking clear liquids and ready to come to floor.Vital signs are all very good and patient states no pain,nausea or any other problems.

## 2018-10-07 NOTE — Consult Note (Signed)
[name], MD [address], [place], [place], [place], [address] [address], [place], [place], [place], [address] Phone: [phone]  Fax: [phone]  Consultation  Referring Provider:     Dr. Cherylann Banas Primary Care Physician:  [name], MD Reason for Consultation:     Sigmoid volvulus  Date of Admission:  10/07/2018 Date of Consultation:  10/07/2018         HPI:   [name] is a 84 y.o. male presents with 9-day history of obstipation.  Has not moved his bowels or has Lachemann's over the last 9 days.  Reports history of chronic constipation as well.  No nausea or vomiting.  CT abdomen showed "there appears to be twisting of the sigmoid colon concerning for volvulus resulting in mild dilatation of the more proximal colon".  Patient denies any previous history of volvulus.  Had a colonoscopy recently on September 15, 2018 for polyp surveillance by Dr. Marius Ditch, and 6 polyps removed, all less than 1 cm in size.  Largest was 7 mm in size in the rectum removed with a hot snare.  The rest were removed with cold snare and cold biopsy forceps.  Patient is on Eliquis and last dose was yesterday morning, over 24 hours ago.  No episodes of bleeding.  Past Medical History:  Diagnosis Date  . Acute diverticulitis 12/15   [facility]  . Acute diverticulitis   . Allergic rhinitis   . Atrial fibrillation or flutter 12/10   Has been paroxysmal. Patient has not tolerated coumadin due to headaches. ECHO (12/10) Mild LVH, EF 55%,   . BPH (benign prostatic hyperplasia)   . Chest pain 12/10   Lexiscan myoview with EF 55%, no ischemia or infarction  . Dysrhythmia    AFIB  . HOH (hard of hearing)   . Hyperlipemia   . Hypertension   . Osteoarthritis   . Pacemaker  medtronic 02/06/2010   Qualifier: Diagnosis of  By: Lovena Le, MD, [facility], Binnie Kand   . Presence of permanent cardiac pacemaker   . Sick sinus syndrome (HCC)    S/P dual chamber Medtronic PCM    Past Surgical  History:  Procedure Laterality Date  . CATARACT EXTRACTION W/PHACO Left 01/16/2017   Procedure: CATARACT EXTRACTION PHACO AND INTRAOCULAR LENS PLACEMENT (IOC);  Surgeon: Estill Cotta, MD;  Location: [facility] ORS;  Service: Ophthalmology;  Laterality: Left;  [place] 1:51.3 AP% 25.9 CDE 54.80 FLUID PACK LOT # [id number] H  . COLONOSCOPY WITH PROPOFOL N/A 09/15/2018   Procedure: COLONOSCOPY WITH PROPOFOL;  Surgeon: Lin Landsman, MD;  Location: [facility] ENDOSCOPY;  Service: Gastroenterology;  Laterality: N/A;  . dual chamber pacemaker  01/11   Dr. Lovena Le  . LAPAROSCOPIC CHOLECYSTECTOMY  1/14   Dr Bary Castilla  . VASECTOMY  in his 79's    Prior to Admission medications   Medication Sig Start Date End Date Taking? Authorizing Provider  ALPRAZolam (XANAX) 0.25 MG tablet TAKE 1 TABLET BY MOUTH TWICE DAILY AS NEEDED FOR ANXIETY 08/08/18  Yes [name], MD  apixaban (ELIQUIS) 2.5 MG TABS tablet Take 2.5 mg by mouth 2 (two) times daily.   Yes [provider]  ciprofloxacin (CIPRO) 250 MG tablet Take 1 tablet (250 mg total) by mouth 2 (two) times daily. 10/06/18  Yes Jearld Fenton, NP  diltiazem (TIAZAC) 180 MG 24 hr capsule Take 180 mg by mouth daily.   Yes [provider]  finasteride (PROSCAR) 5 MG tablet Take 1 tablet (5 mg total)  by mouth daily. 09/13/17  Yes [name], MD  flecainide (TAMBOCOR) 100 MG tablet Take 1 tablet (100 mg total) by mouth 2 (two) times daily. 12/13/14  Yes Deboraha Sprang, MD  Glucosamine 500 MG CAPS Take by mouth daily.     Yes [provider]  ibuprofen (ADVIL,MOTRIN) 200 MG tablet Take 200 mg by mouth every 6 (six) hours as needed for moderate pain.   Yes [provider]  metroNIDAZOLE (FLAGYL) 500 MG tablet Take 1 tablet (500 mg total) by mouth 3 (three) times daily. 10/06/18  Yes Jearld Fenton, NP  tamsulosin (FLOMAX) 0.4 MG CAPS capsule TAKE ONE CAPSULE BY MOUTH EVERY DAY 08/16/14  Yes [name], MD  acetaminophen  (TYLENOL) 325 MG tablet Take 650 mg by mouth every 6 (six) hours as needed. Reported on 01/17/2016    [provider]    Family History  Problem Relation Age of Onset  . Hypertension Mother   . Alcohol abuse Father   . Bladder Cancer Neg Hx   . Prostate cancer Neg Hx   . Kidney cancer Neg Hx      Social History   Tobacco Use  . Smoking status: Former Research scientist (life sciences)  . Smokeless tobacco: Never Used  . Tobacco comment: In [place], quit in 1960's  Substance Use Topics  . Alcohol use: No  . Drug use: No    Allergies as of 10/07/2018  . (No Known Allergies)    Review of Systems:    All systems reviewed and negative except where noted in HPI.   Physical Exam:  Vital signs in last 24 hours: Vitals:   10/07/18 0809 10/07/18 0830 10/07/18 1213 10/07/18 1309  BP: 136/82 (!) 157/82 (!) 150/88 (!) 155/87  Pulse: 70 60 (!) 59 61  Resp: 18  18 18   Temp: 97.8 F (36.6 C)  98.6 F (37 C)   TempSrc: Oral  Oral   SpO2: 97% 97% 97% 98%  Weight: 78 kg     Height: 5\' 11"  (1.803 m)        General:   Pleasant, cooperative in NAD Head:  Normocephalic and atraumatic. Eyes:   No icterus.   Conjunctiva pink. PERRLA. Ears:  Normal auditory acuity. Neck:  Supple; no masses or thyroidomegaly Lungs: Respirations even and unlabored. Lungs clear to auscultation bilaterally.   No wheezes, crackles, or rhonchi.  Abdomen:  Soft, nondistended, nontender. Normal bowel sounds. No appreciable masses or hepatomegaly.  No rebound or guarding.  Neurologic:  Alert and oriented x3;  grossly normal neurologically. Skin:  Intact without significant lesions or rashes. Cervical Nodes:  No significant cervical adenopathy. Psych:  Alert and cooperative. Normal affect.  LAB RESULTS: Recent Labs    10/07/18 0946  WBC 5.3  HGB 14.2  HCT 41.8  PLT 243   BMET Recent Labs    10/07/18 0946  NA 139  K 3.7  CL 107  CO2 23  GLUCOSE 103*  BUN 20  CREATININE 1.08  CALCIUM 9.2   LFT Recent Labs     10/07/18 0946  PROT 8.2*  ALBUMIN 4.2  AST 17  ALT 11  ALKPHOS 57  BILITOT 1.1  BILIDIR 0.1  IBILI 1.0*   PT/INR No results for input(s): LABPROT, INR in the last 72 hours.  STUDIES: Ct Abdomen Pelvis W Contrast  Result Date: 10/07/2018 CLINICAL DATA:  Acute generalized abdominal pain. EXAM: CT ABDOMEN AND PELVIS WITH CONTRAST TECHNIQUE: Multidetector CT imaging of the abdomen and pelvis  was performed using the standard protocol following bolus administration of intravenous contrast. CONTRAST:  146mL ISOVUE-300 IOPAMIDOL (ISOVUE-300) INJECTION 61% COMPARISON:  CT scan of June 22, 2017. FINDINGS: Lower chest: No acute abnormality. Hepatobiliary: Status post cholecystectomy. No biliary dilatation is noted. Stable appendix cysts are noted. Pancreas: Unremarkable. No pancreatic ductal dilatation or surrounding inflammatory changes. Spleen: Normal in size without focal abnormality. Adrenals/Urinary Tract: Adrenal glands are unremarkable. Kidneys are normal, without renal calculi, focal lesion, or hydronephrosis. Bladder is unremarkable. Stomach/Bowel: The stomach appears normal. The appendix is not visualized. No small bowel dilatation is noted. However, there appears to be a volvulus involving the sigmoid colon with dilatation of the more proximal colon loops. Vascular/Lymphatic: Aortic atherosclerosis. No enlarged abdominal or pelvic lymph nodes. Reproductive: Stable moderate prostatic enlargement is noted. Other: Stable right inguinal hernia is noted which contains a loop of small bowel, but does not result in incarceration or obstruction. Musculoskeletal: Severe multilevel degenerative disc disease is noted in the lumbar spine. No acute osseous abnormality is noted. IMPRESSION: There appears to be twisting of the sigmoid colon concerning for volvulus resulting in mild dilatation obstruction of the more proximal colon. Sigmoidoscopy is recommended for further evaluation. Critical Value/emergent  results were called by telephone at the time of interpretation on 10/07/2018 at 10:58 am to Dr. Arta Silence , who verbally acknowledged these results. Stable moderate prostatic enlargement. Aortic Atherosclerosis (ICD10-I70.0). Electronically Signed   By: Marijo Conception, M.D.   On: 10/07/2018 10:58   Dg Abd 2 Views  Result Date: 10/07/2018 CLINICAL DATA:  No bowel movement in 9 days according to the patient, abdominal pain and bloating, history of diverticulitis EXAM: ABDOMEN - 2 VIEW COMPARISON:  CT abdomen pelvis of 06/22/2017 FINDINGS: Both large and small bowel gas is present without significant distension. Slight gaseous distention of the right colon is noted of questionable significance. There is no evidence of bowel obstruction currently. Permanent pacemaker wires remain. No opaque calculus is seen. There are degenerative changes in the lumbar spine. Surgical clips are present in the right upper quadrant from prior cholecystectomy. IMPRESSION: 1. Slight gaseous distention of the right colon, but no definite evidence of bowel obstruction is noted. 2. Diffuse degenerative change throughout the lumbar spine. Electronically Signed   By: Ivar Drape M.D.   On: 10/07/2018 09:03      Impression / Plan:   [name] is a 84 y.o. y/o male with sigmoid volvulus on CT scan  Given CT scan findings, patient will need sigmoidoscopy for decompression We discussed with him that even though he is on Eliquis, the benefits of the procedure outweigh the risks at this time as waiting to do a flexible sigmoidoscopy until the Eliquis is out of his system can entail the risks of ischemia from the volvulus itself.  Risks and benefits of procedure discussed in detail with patient and he would like to proceed with the procedure  Patient reports chronic constipation and have encouraged him to eat a high-fiber diet and use MiraLAX daily to avoid constipation in the future  Recommend surgery consult as if he  has recurrent volvulus on this admission or in the future, surgical intervention would be recommended  Patient is a clinic patient of Dr. Celene Kras should follow-up with Dr. Valetta Fuller in GI clinic as well upon discharge  I have discussed alternative options, risks & benefits,  which include, but are not limited to, bleeding, infection, perforation,respiratory complication & drug reaction.  The patient agrees with this  plan & written consent will be obtained.     Thank you for involving me in the care of this patient.      LOS: 0 days   Virgel Manifold, MD  10/07/2018, 1:30 PM

## 2018-10-07 NOTE — Anesthesia Preprocedure Evaluation (Signed)
Anesthesia Evaluation  Patient identified by MRN, date of birth, ID band Patient awake    Reviewed: Allergy & Precautions, NPO status , Patient's Chart, lab work & pertinent test results  History of Anesthesia Complications Negative for: history of anesthetic complications  Airway Mallampati: II  TM Distance: >3 FB Neck ROM: Full    Dental no notable dental hx.    Pulmonary neg sleep apnea, neg COPD, former smoker,    breath sounds clear to auscultation- rhonchi (-) wheezing      Cardiovascular hypertension, (-) CAD, (-) Past MI, (-) Cardiac Stents and (-) CABG + dysrhythmias Atrial Fibrillation + pacemaker  Rhythm:Regular Rate:Normal - Systolic murmurs and - Diastolic murmurs    Neuro/Psych neg Seizures PSYCHIATRIC DISORDERS negative neurological ROS     GI/Hepatic Neg liver ROS, GERD  ,  Endo/Other  negative endocrine ROSneg diabetes  Renal/GU negative Renal ROS     Musculoskeletal  (+) Arthritis ,   Abdominal (+) - obese,   Peds  Hematology negative hematology ROS (+)   Anesthesia Other Findings Past Medical History: 12/15: Acute diverticulitis     Comment:  [facility] No date: Acute diverticulitis No date: Allergic rhinitis 12/10: Atrial fibrillation or flutter     Comment:  Has been paroxysmal. Patient has not tolerated coumadin               due to headaches. ECHO (12/10) Mild LVH, EF 55%,  No date: BPH (benign prostatic hyperplasia) 12/10: Chest pain     Comment:  Lexiscan myoview with EF 55%, no ischemia or infarction No date: Dysrhythmia     Comment:  AFIB No date: HOH (hard of hearing) No date: Hyperlipemia No date: Hypertension No date: Osteoarthritis 02/06/2010: Pacemaker  medtronic     Comment:  Qualifier: Diagnosis of  By: Lovena Le, MD, Martyn Malay  No date: Presence of permanent cardiac pacemaker No date: Sick sinus syndrome ([place])     Comment:  S/P dual chamber Medtronic  PCM   Reproductive/Obstetrics                             Anesthesia Physical Anesthesia Plan  ASA: III  Anesthesia Plan: General   Post-op Pain Management:    Induction: Intravenous  PONV Risk Score and Plan: 1 and Propofol infusion  Airway Management Planned: Natural Airway  Additional Equipment:   Intra-op Plan:   Post-operative Plan:   Informed Consent: I have reviewed the patients History and Physical, chart, labs and discussed the procedure including the risks, benefits and alternatives for the proposed anesthesia with the patient or authorized representative who has indicated his/her understanding and acceptance.   Dental advisory given  Plan Discussed with: CRNA and Anesthesiologist  Anesthesia Plan Comments:         Anesthesia Quick Evaluation

## 2018-10-07 NOTE — ED Provider Notes (Signed)
[facility] Emergency Department Provider Note ____________________________________________   First MD Initiated Contact with Patient 10/07/18 [phone]     (approximate)  I have reviewed the triage vital signs and the nursing notes.   HISTORY  Chief Complaint Constipation and Abdominal Pain    HPI PADEN SENGER is a 84 y.o. male with PMH as noted below who presents with constipation, occurring over the last 9 days, with no bowel movement in that time.  The patient states he gets occasional crampy abdominal pain but has no vomiting and denies any rectal pain or pressure.  The patient states that he initially had some more constant abdominal pain.  He was seen by his doctor's office yesterday and prescribed antibiotics for presumed diverticulitis.  He states he has taken MiraLAX as well as magnesium citrate without relief.  Past Medical History:  Diagnosis Date  . Acute diverticulitis 12/15   [facility]  . Acute diverticulitis   . Allergic rhinitis   . Atrial fibrillation or flutter 12/10   Has been paroxysmal. Patient has not tolerated coumadin due to headaches. ECHO (12/10) Mild LVH, EF 55%,   . BPH (benign prostatic hyperplasia)   . Chest pain 12/10   Lexiscan myoview with EF 55%, no ischemia or infarction  . Dysrhythmia    AFIB  . HOH (hard of hearing)   . Hyperlipemia   . Hypertension   . Osteoarthritis   . Pacemaker  medtronic 02/06/2010   Qualifier: Diagnosis of  By: Lovena Le, MD, [facility], Binnie Kand   . Presence of permanent cardiac pacemaker   . Sick sinus syndrome (HCC)    S/P dual chamber Medtronic PCM    Patient Active Problem List   Diagnosis Date Noted  . Adenoma of colon   . Visual distortion 07/14/2018  . Tick bite of left lower leg 04/14/2018  . Upper abdominal pain 09/13/2017  . Dark urine 07/07/2017  . Mood disorder ([place]) 12/03/2016  . Chronic constipation 01/10/2015  . Acute diverticulitis   . Counseling regarding advanced directives  04/23/2014  . Routine general medical examination at a health care facility 04/23/2014  . Osteoarthritis of left shoulder 10/21/2013  . GERD (gastroesophageal reflux disease) 02/09/2013  . ACTINIC KERATOSIS 05/04/2010  . Pacemaker  medtronic 02/06/2010  . Sick sinus syndrome ([place]) 10/27/2009  . Atrial fibrillation ([place]) 10/06/2009  . HEPATIC CYST 10/06/2009  . Hyperlipemia 07/12/2009  . Essential hypertension, benign 07/12/2009  . ALLERGIC RHINITIS 07/12/2009  . BPH with obstruction/lower urinary tract symptoms 07/12/2009  . OSTEOARTHRITIS 07/12/2009    Past Surgical History:  Procedure Laterality Date  . CATARACT EXTRACTION W/PHACO Left 01/16/2017   Procedure: CATARACT EXTRACTION PHACO AND INTRAOCULAR LENS PLACEMENT (IOC);  Surgeon: Estill Cotta, MD;  Location: [facility] ORS;  Service: Ophthalmology;  Laterality: Left;  [place] 1:51.3 AP% 25.9 CDE 54.80 FLUID PACK LOT # [id number] H  . COLONOSCOPY WITH PROPOFOL N/A 09/15/2018   Procedure: COLONOSCOPY WITH PROPOFOL;  Surgeon: Lin Landsman, MD;  Location: [facility] ENDOSCOPY;  Service: Gastroenterology;  Laterality: N/A;  . dual chamber pacemaker  01/11   Dr. Lovena Le  . LAPAROSCOPIC CHOLECYSTECTOMY  1/14   Dr Bary Castilla  . VASECTOMY  in his 70's    Prior to Admission medications   Medication Sig Start Date End Date Taking? Authorizing Provider  acetaminophen (TYLENOL) 325 MG tablet Take 650 mg by mouth every 6 (six) hours as needed. Reported on 01/17/2016    [provider]  ALPRAZolam Duanne Moron) 0.25 MG tablet TAKE 1  TABLET BY MOUTH TWICE DAILY AS NEEDED FOR ANXIETY 08/08/18   [name], MD  apixaban (ELIQUIS) 2.5 MG TABS tablet Take 2.5 mg by mouth 2 (two) times daily.    [provider]  ciprofloxacin (CIPRO) 250 MG tablet Take 1 tablet (250 mg total) by mouth 2 (two) times daily. 10/06/18   Jearld Fenton, NP  diltiazem (TIAZAC) 180 MG 24 hr capsule Take 180 mg by mouth daily.    [provider]    finasteride (PROSCAR) 5 MG tablet Take 1 tablet (5 mg total) by mouth daily. 09/13/17   [name], MD  flecainide (TAMBOCOR) 100 MG tablet Take 1 tablet (100 mg total) by mouth 2 (two) times daily. 12/13/14   Deboraha Sprang, MD  Glucosamine 500 MG CAPS Take by mouth daily.      [provider]  metroNIDAZOLE (FLAGYL) 500 MG tablet Take 1 tablet (500 mg total) by mouth 3 (three) times daily. 10/06/18   Jearld Fenton, NP  tamsulosin (FLOMAX) 0.4 MG CAPS capsule TAKE ONE CAPSULE BY MOUTH EVERY DAY 08/16/14   [name], MD    Allergies Patient has no known allergies.  Family History  Problem Relation Age of Onset  . Hypertension Mother   . Alcohol abuse Father   . Bladder Cancer Neg Hx   . Prostate cancer Neg Hx   . Kidney cancer Neg Hx     Social History Social History   Tobacco Use  . Smoking status: Former Research scientist (life sciences)  . Smokeless tobacco: Never Used  . Tobacco comment: In [place], quit in 1960's  Substance Use Topics  . Alcohol use: No  . Drug use: No    Review of Systems  Constitutional: No fever. Eyes: No redness. ENT: No sore throat. Cardiovascular: Denies chest pain. Respiratory: Denies shortness of breath. Gastrointestinal: No vomiting.  Genitourinary: Negative for flank pain.  Musculoskeletal: Negative for back pain. Skin: Negative for rash. Neurological: Negative for headache.   ____________________________________________   PHYSICAL EXAM:  VITAL SIGNS: ED Triage Vitals [10/07/18 0809]  Enc Vitals Group     BP 136/82     Pulse Rate 70     Resp 18     Temp 97.8 F (36.6 C)     Temp Source Oral     SpO2 97 %     Weight 172 lb (78 kg)     Height 5\' 11"  (1.803 m)     Head Circumference      Peak Flow      Pain Score 6     Pain Loc      Pain Edu?      Excl. in [place]?     Constitutional: Alert and oriented. Well appearing and in no acute distress. Eyes: Conjunctivae are normal.  Head: Atraumatic. Nose: No  congestion/rhinnorhea. Mouth/Throat: Mucous membranes are moist.   Neck: Normal range of motion.  Cardiovascular: Good peripheral circulation. Respiratory: Normal respiratory effort.  Gastrointestinal: Soft and nontender. No distention.  No palpable or impacted stool on DRE. Genitourinary: No flank tenderness. Musculoskeletal: Extremities warm and well perfused.  Neurologic:  Normal speech and language. No gross focal neurologic deficits are appreciated.  Skin:  Skin is warm and dry. No rash noted. Psychiatric: Mood and affect are normal. Speech and behavior are normal.  ____________________________________________   LABS (all labs ordered are listed, but only abnormal results are displayed)  Labs Reviewed  BASIC METABOLIC PANEL - Abnormal; Notable for the following components:  Result Value   Glucose, Bld 103 (*)    All other components within normal limits  HEPATIC FUNCTION PANEL - Abnormal; Notable for the following components:   Total Protein 8.2 (*)    Indirect Bilirubin 1.0 (*)    All other components within normal limits  CBC WITH DIFFERENTIAL/PLATELET  LIPASE, BLOOD  URINALYSIS, COMPLETE (UACMP) WITH MICROSCOPIC   ____________________________________________  EKG   ____________________________________________  RADIOLOGY  XR abdomen: No obvious obstruction; possible dilation of right colon CT abdomen: Sigmoid volvulus  ____________________________________________   PROCEDURES  Procedure(s) performed: No  Procedures  Critical Care performed: No ____________________________________________   INITIAL IMPRESSION / ASSESSMENT AND PLAN / ED COURSE  Pertinent labs & imaging results that were available during my care of the patient were reviewed by me and considered in my medical decision making (see chart for details).  84 year old male with history of constipation and diverticulitis presents with constipation over the last 9 days with some crampy  abdominal pain.  I reviewed the past medical records in Epic; the patient had a colonoscopy on 09/15/2018 significant for several polyps and hemorrhoids but no other acute findings.  He was seen by the NP at his primary care office yesterday and started on Cipro and Flagyl for presumed diverticulitis.  On exam currently the patient is well-appearing.  His vital signs are normal.  The abdomen is soft and nontender.  He has no impacted stool on rectal exam.  The presentation is consistent with constipation.  It is possible that the patient is having a bout of diverticulitis although he has no peritoneal signs, fever, or other evidence of complications.  We will obtain plain films of the abdomen to evaluate for stool burden and reassess.  ----------------------------------------- 11:27 AM on 10/07/2018 -----------------------------------------  X-ray showed dilation of the right colon which appeared somewhat abnormal so I proceeded with a CT.  The CT shows findings consistent with sigmoid volvulus.  This would certainly explain the patient's symptoms.  I consulted Dr. Bonna Gains from GI who will perform a sigmoidoscopy today.  She requested that the patient be admitted to the inpatient service.  I signed him out to the hospitalist Dr. Posey Pronto.  I also consulted Dr. Rosana Hoes from surgery who will evaluate the patient in case she requires surgical intervention. ____________________________________________   FINAL CLINICAL IMPRESSION(S) / ED DIAGNOSES  Final diagnoses:  Constipation  Sigmoid volvulus ([place])      NEW MEDICATIONS STARTED DURING THIS VISIT:  New Prescriptions   No medications on file     Note:  This document was prepared using Dragon voice recognition software and may include unintentional dictation errors.    Arta Silence, MD 10/07/18 1128

## 2018-10-07 NOTE — Consult Note (Addendum)
SURGICAL CONSULTATION NOTE (initial) - cpt: 32951  Patient seen and examined as described below with surgical PA-C, Ardell Isaacs.  Assessment/Plan: (ICD-10's: K56.2) In summary, patient is a 84 y.o. male with sigmoid volvulus without evidence of ischemia or perforation, complicated by pertinent comorbidities including advanced chronological age, chronic constipation, HTN, HLD, sick sinus syndrome s/p permanent pacemaker insertion, chronic therapeutic anticoagulation for chronic atrial fibrillation, osteoarthritis, BPH, and former tobacco abuse (smoking).   - no indication for emergent surgical intervention at this time  - gastroenterology consultation for sigmoidoscopic decompression  - if volvulus recurs and patient is a surgical candidate or has ischemia/perforation, consider sigmoid colectomy  - continue to monitor abdominal examination and on-going bowel function  - ongoing bowel regimen including daily Colace stool softener  - medical management as per primary medical team  - ambulation encouraged    I have personally reviewed the patient's chart, evaluated/examined the patient, proposed the recommended management, and discussed these recommendations with the patient and his family to their expressed satisfaction as well as with patient's ED physician.  Thank you for the opportunity to participate in this patient's care.  -- Marilynne Drivers Rosana Hoes, MD, [place]: [facility] - Partnering for exceptional care. Office: [phone]  [facility] SURGICAL CONSULTATION NOTE - [phone]   HISTORY OF PRESENT ILLNESS (HPI):  84 y.o. male presented to [facility] ED today for evaluation of possible constipation. Patient reports that he has been constipated for about 9 days now without any bowel movement. In addition to lack of bowel movement, he endorses associated intermittent abdominal pain which is cramping in nature as well as abdominal  distension. He denied any associated fevers, chills, chest pain, or shortness of breath, nausea, or emesis. He did go to his PCP and was prescribed ABx for presumed diverticulitis and had also tried Miralax and mag citrate without improvement. Of note, he did undergo colonoscopy on 12/9 with Dr Marius Ditch which showed non-bleeding hemorrhoids and multiple polyps. Work up in the ED was concerning for sigmoid volvulus.    Surgery is consulted by emergency medicine physician Dr. Cherylann Banas, MD in this context for evaluation and management of sigmoid volvulus.  PAST MEDICAL HISTORY (PMH):  Past Medical History:  Diagnosis Date  . Acute diverticulitis 12/15   [facility]  . Acute diverticulitis   . Allergic rhinitis   . Atrial fibrillation or flutter 12/10   Has been paroxysmal. Patient has not tolerated coumadin due to headaches. ECHO (12/10) Mild LVH, EF 55%,   . BPH (benign prostatic hyperplasia)   . Chest pain 12/10   Lexiscan myoview with EF 55%, no ischemia or infarction  . Dysrhythmia    AFIB  . HOH (hard of hearing)   . Hyperlipemia   . Hypertension   . Osteoarthritis   . Pacemaker  medtronic 02/06/2010   Qualifier: Diagnosis of  By: Lovena Le, MD, [facility], Binnie Kand   . Presence of permanent cardiac pacemaker   . Sick sinus syndrome (HCC)    S/P dual chamber Medtronic PCM    PAST SURGICAL HISTORY ([place]):  Past Surgical History:  Procedure Laterality Date  . CATARACT EXTRACTION W/PHACO Left 01/16/2017   Procedure: CATARACT EXTRACTION PHACO AND INTRAOCULAR LENS PLACEMENT (IOC);  Surgeon: Estill Cotta, MD;  Location: [facility] ORS;  Service: Ophthalmology;  Laterality: Left;  [place] 1:51.3 AP% 25.9 CDE 54.80 FLUID PACK LOT # [id number]  . COLONOSCOPY WITH PROPOFOL N/A 09/15/2018   Procedure: COLONOSCOPY WITH PROPOFOL;  Surgeon: Lin Landsman, MD;  Location: [facility] ENDOSCOPY;  Service: Gastroenterology;  Laterality: N/A;  . dual chamber pacemaker  01/11   Dr. Lovena Le  . LAPAROSCOPIC CHOLECYSTECTOMY   1/14   Dr Bary Castilla  . VASECTOMY  in his 51's    MEDICATIONS:  Prior to Admission medications   Medication Sig Start Date End Date Taking? Authorizing Provider  acetaminophen (TYLENOL) 325 MG tablet Take 650 mg by mouth every 6 (six) hours as needed. Reported on 01/17/2016    [provider]  ALPRAZolam Duanne Moron) 0.25 MG tablet TAKE 1 TABLET BY MOUTH TWICE DAILY AS NEEDED FOR ANXIETY 08/08/18   [name], MD  apixaban (ELIQUIS) 2.5 MG TABS tablet Take 2.5 mg by mouth 2 (two) times daily.    [provider]  ciprofloxacin (CIPRO) 250 MG tablet Take 1 tablet (250 mg total) by mouth 2 (two) times daily. 10/06/18   Jearld Fenton, NP  diltiazem (TIAZAC) 180 MG 24 hr capsule Take 180 mg by mouth daily.    [provider]  finasteride (PROSCAR) 5 MG tablet Take 1 tablet (5 mg total) by mouth daily. 09/13/17   [name], MD  flecainide (TAMBOCOR) 100 MG tablet Take 1 tablet (100 mg total) by mouth 2 (two) times daily. 12/13/14   Deboraha Sprang, MD  Glucosamine 500 MG CAPS Take by mouth daily.      [provider]  metroNIDAZOLE (FLAGYL) 500 MG tablet Take 1 tablet (500 mg total) by mouth 3 (three) times daily. 10/06/18   Jearld Fenton, NP  tamsulosin (FLOMAX) 0.4 MG CAPS capsule TAKE ONE CAPSULE BY MOUTH EVERY DAY 08/16/14   [name], MD    ALLERGIES:  No Known Allergies   SOCIAL HISTORY:  Social History   Socioeconomic History  . Marital status: Married    Spouse name: Not on file  . Number of children: 3  . Years of education: Not on file  . Highest education level: Not on file  Occupational History  . Occupation: Retired    Fish farm manager: RETIRED    Comment: Arboriculturist  . Financial resource strain: Not on file  . Food insecurity:    Worry: Not on file    Inability: Not on file  . Transportation needs:    Medical: Not on file    Non-medical: Not on file  Tobacco Use  . Smoking status: Former Research scientist (life sciences)  .  Smokeless tobacco: Never Used  . Tobacco comment: In [place], quit in 1960's  Substance and Sexual Activity  . Alcohol use: No  . Drug use: No  . Sexual activity: Yes  Lifestyle  . Physical activity:    Days per week: Not on file    Minutes per session: Not on file  . Stress: Not on file  Relationships  . Social connections:    Talks on phone: Not on file    Gets together: Not on file    Attends religious service: Not on file    Active member of club or organization: Not on file    Attends meetings of clubs or organizations: Not on file    Relationship status: Not on file  . Intimate partner violence:    Fear of current or ex partner: Not on file    Emotionally abused: Not on file    Physically abused: Not on file    Forced sexual activity: Not on file  Other Topics Concern  . Not on file  Social History Narrative  2 Daughters, one son killed in [place]      Has living will   Wife, then daughter Maudie Mercury, is health care POA   Would accept resuscitation attempts but no prolonged life support.   No tube feeding if cognitively unaware    FAMILY HISTORY:  Family History  Problem Relation Age of Onset  . Hypertension Mother   . Alcohol abuse Father   . Bladder Cancer Neg Hx   . Prostate cancer Neg Hx   . Kidney cancer Neg Hx     REVIEW OF SYSTEMS:  Review of Systems  Constitutional: Negative for chills and fever.  Respiratory: Negative for cough and sputum production.   Cardiovascular: Negative for chest pain and palpitations.  Gastrointestinal: Positive for abdominal pain and constipation. Negative for blood in stool, diarrhea, nausea and vomiting.  Genitourinary: Negative for dysuria and hematuria.  Musculoskeletal: Negative for myalgias.  Neurological: Negative for dizziness and headaches.  All other systems reviewed and are negative.   VITAL SIGNS:  Temp:  [97.8 F (36.6 C)] 97.8 F (36.6 C) (12/31 0809) Pulse Rate:  [70] 70 (12/31 0809) Resp:  [18] 18 (12/31  0809) BP: (136)/(82) 136/82 (12/31 0809) SpO2:  [97 %] 97 % (12/31 0809) Weight:  [78 kg] 78 kg (12/31 0809)     Height: 5\' 11"  (180.3 cm) Weight: 78 kg BMI (Calculated): 24   INTAKE/OUTPUT:  This shift: No intake/output data recorded.  Last 2 shifts: @IOLAST2SHIFTS @   PHYSICAL EXAM:  Physical Exam Constitutional:      General: He is not in acute distress.    Appearance: He is well-developed and normal weight. He is not ill-appearing.  HENT:     Head: Normocephalic and atraumatic.  Eyes:     General: No scleral icterus.    Extraocular Movements: Extraocular movements intact.  Cardiovascular:     Rate and Rhythm: Normal rate.     Heart sounds: Normal heart sounds. No murmur. No friction rub.  Pulmonary:     Effort: Pulmonary effort is normal. No respiratory distress.     Breath sounds: Normal breath sounds. No wheezing or rhonchi.  Abdominal:     General: Bowel sounds are normal. There is distension.     Palpations: Abdomen is soft.     Tenderness: There is generalized abdominal tenderness. There is no guarding or rebound.  Genitourinary:    Comments: Deferred Skin:    General: Skin is warm and dry.     Coloration: Skin is not jaundiced or pale.  Neurological:     General: No focal deficit present.     Mental Status: He is alert and oriented to person, place, and time.  Psychiatric:        Mood and Affect: Mood normal.        Behavior: Behavior normal.   Labs:  CBC Latest Ref Rng & Units 10/07/2018 06/22/2017 12/03/2016  WBC 4.0 - 10.5 K/uL 5.3 11.9(H) 4.6  Hemoglobin 13.0 - 17.0 g/dL 14.2 15.3 14.4  Hematocrit 39.0 - 52.0 % 41.8 42.8 41.3  Platelets 150 - 400 K/uL 243 205 253.0   CMP Latest Ref Rng & Units 10/07/2018 06/22/2017 12/03/2016  Glucose 70 - 99 mg/dL 103(H) 142(H) 82  BUN 8 - 23 mg/dL 20 14 15   Creatinine 0.61 - 1.24 mg/dL 1.08 1.03 1.09  Sodium 135 - 145 mmol/L 139 137 139  Potassium 3.5 - 5.1 mmol/L 3.7 4.1 3.9  Chloride 98 - 111 mmol/L 107 103 104  CO2  22 -  32 mmol/L 23 24 26   Calcium 8.9 - 10.3 mg/dL 9.2 8.8(L) 9.1  Total Protein 6.5 - 8.1 g/dL 8.2(H) 7.9 7.6  Total Bilirubin 0.3 - 1.2 mg/dL 1.1 1.4(H) 0.6  Alkaline Phos 38 - 126 U/L 57 59 58  AST 15 - 41 U/L 17 19 14   ALT 0 - 44 U/L 11 11(L) 10   Imaging studies:  XR Abdomen (10/07/2018):  1. Slight gaseous distention of the right colon, but no definite evidence of bowel obstruction is noted. 2. Diffuse degenerative change throughout the lumbar spine.  CT Abdomen/Pelvis (10/07/2018): 1) There appears to be twisting of the sigmoid colon concerning for volvulus resulting in mild dilatation obstruction of the more proximal colon. Sigmoidoscopy is recommended for further evaluation. 2) Stable moderate prostatic enlargement. 3) Aortic Atherosclerosis.  Assessment/Plan: (ICD-10's: K86.2) 84 y.o. male with abdominal distension and pain most likely secondary to sigmoid volvulus on imaging, complicated by pertinent comorbidities including history of diverticulosis, atrial fibrillation on Eliquis, history os sick sinus syndrome s/p pacemaker placement, HLD, HTN, advanced age, and former tobacco abuse (smoking).   - No indication for emergent surgical intervention at this time  - Gastroenterology consultation for sigmoidoscopic decompression  - If volvulus recurs and patient is a surgical candidate or has ischemia/perforation, consider sigmoid colectomy  - Continue to monitor abdominal examination and on-going bowel function  - Ongoing bowel regimen including daily Colace stool softener  - Medical management as per primary medical team  - Ambulation encouraged    All of the above findings and recommendations were discussed with the patient and his family, and all of patient's and his family's questions were answered to their expressed satisfaction.  Thank you for the opportunity to participate in this patient's care.   -- Edison Simon, PA-C [facility] 10/07/2018,  11:26 AM [phone] M-F: 7am - 4pm

## 2018-10-07 NOTE — Anesthesia Post-op Follow-up Note (Signed)
Anesthesia QCDR form completed.        

## 2018-10-07 NOTE — ED Notes (Signed)
Assumed care of patient reports no bowel movement 9 days ago. Denies vomiting, reports abd pain 4/10, concerned because he has not evacuated his bowels in 9 days. abd soft to touch. Positive bowel sounds all 4 quads.

## 2018-10-07 NOTE — ED Triage Notes (Signed)
Patient reports that he hasn't had a bowel movement in 9 days. Reports abdominal pain and bloating. Denies N/V. Reports history of diverticulitis. Alert and oriented and ambulatory with steady gait in triage.

## 2018-10-07 NOTE — ED Notes (Signed)
Endo nurse called aware patient was not given enema. Requested patient be sent up to endo ready for patient. Float nurse/ tech called for patient transfer to endoscopy.

## 2018-10-08 ENCOUNTER — Inpatient Hospital Stay: Payer: Medicare Other

## 2018-10-08 MED ORDER — TAMSULOSIN HCL 0.4 MG PO CAPS
0.4000 mg | ORAL_CAPSULE | Freq: Every day | ORAL | Status: DC
  Administered 2018-10-08 – 2018-10-15 (×7): 0.4 mg via ORAL
  Filled 2018-10-08 (×7): qty 1

## 2018-10-08 MED ORDER — DILTIAZEM HCL ER COATED BEADS 180 MG PO CP24
180.0000 mg | ORAL_CAPSULE | Freq: Every day | ORAL | Status: DC
  Administered 2018-10-08 – 2018-10-15 (×7): 180 mg via ORAL
  Filled 2018-10-08 (×9): qty 1

## 2018-10-08 NOTE — Progress Notes (Signed)
Subjective:  CC:  [name] is a 85 y.o. male  Hospital stay day 1, 1 Day Post-Op endoscopic decompression of sigmoid volvulus.   HPI: Pt complaining of episodic abdominal pain that resolved on its own after attempting regular diet this am.  No flatus or BM since eating breakfast and pain has now resolved.   ROS:  General: Denies weight loss, weight gain, fatigue, fevers, chills, and night sweats. Heart: Denies chest pain, palpitations, racing heart, irregular heartbeat, leg pain or swelling, and decreased activity tolerance. Respiratory: Denies breathing difficulty, shortness of breath, wheezing, cough, and sputum. GI: Denies change in appetite, heartburn, nausea, vomiting, constipation, diarrhea, and blood in stool. GU: Denies difficulty urinating, pain with urinating, urgency, frequency, blood in urine.  Objective:      Temp:  [97.6 F (36.4 C)-98.6 F (37 C)] 97.6 F (36.4 C) (01/01 0339) Pulse Rate:  [59-77] 60 (01/01 0931) Resp:  [10-20] 20 (01/01 0339) BP: (128-183)/(61-101) 128/61 (01/01 0931) SpO2:  [96 %-100 %] 97 % (01/01 0339)     Height: 5\' 11"  (180.3 cm) Weight: 78 kg BMI (Calculated): 24   Intake/Output this shift:   Intake/Output Summary (Last 24 hours) at 10/08/2018 1121 Last data filed at 10/08/2018 0900 Gross per 24 hour  Intake 601.25 ml  Output -  Net 601.25 ml        Constitutional :  alert, cooperative, appears stated age and no distress  Respiratory:  clear to auscultation bilaterally  Cardiovascular:  Irregular rhythm  Gastrointestinal: soft, non-tender; bowel sounds normal; no masses,  no organomegaly, but focal distention starting in suprapubic region extending to periumbilical area.    Skin: Cool and moist.   Psychiatric: Normal affect, non-agitated, not confused       LABS:  CMP Latest Ref Rng & Units 10/07/2018 06/22/2017 12/03/2016  Glucose 70 - 99 mg/dL 103(H) 142(H) 82  BUN 8 - 23 mg/dL 20 14 15   Creatinine 0.61 - 1.24 mg/dL 1.08  1.03 1.09  Sodium 135 - 145 mmol/L 139 137 139  Potassium 3.5 - 5.1 mmol/L 3.7 4.1 3.9  Chloride 98 - 111 mmol/L 107 103 104  CO2 22 - 32 mmol/L 23 24 26   Calcium 8.9 - 10.3 mg/dL 9.2 8.8(L) 9.1  Total Protein 6.5 - 8.1 g/dL 8.2(H) 7.9 7.6  Total Bilirubin 0.3 - 1.2 mg/dL 1.1 1.4(H) 0.6  Alkaline Phos 38 - 126 U/L 57 59 58  AST 15 - 41 U/L 17 19 14   ALT 0 - 44 U/L 11 11(L) 10   CBC Latest Ref Rng & Units 10/07/2018 06/22/2017 12/03/2016  WBC 4.0 - 10.5 K/uL 5.3 11.9(H) 4.6  Hemoglobin 13.0 - 17.0 g/dL 14.2 15.3 14.4  Hematocrit 39.0 - 52.0 % 41.8 42.8 41.3  Platelets 150 - 400 K/uL 243 205 253.0    RADS: n/a Assessment:   Sigmoid volvulus, s/p endoscopic decompression yesterday, but now had episodic pain with regular food and focal distention noted on abdominal exam.  Will obtain abd films to assess if distention is secondary to residual gas insufflation or if it is recurrence of volvulus.  NPO until then.  Pt notified of plan and is in agreement.

## 2018-10-08 NOTE — Progress Notes (Signed)
[place] at [facility]                                                                                                                                                                                  Patient Demographics   [name], is a 85 y.o. male, DOB - September 18, 1933, QHU:[id number]  Admit date - 10/07/2018   Admitting Physician Dustin Flock, MD  Outpatient Primary MD for the patient is [name], MD   LOS - 1  Subjective: Patient had abdominal pain this morning with eating solid breakfast also again had abdominal pain with eating lunch    Review of Systems:   CONSTITUTIONAL: No documented fever. No fatigue, weakness. No weight gain, no weight loss.  EYES: No blurry or double vision.  ENT: No tinnitus. No postnasal drip. No redness of the oropharynx.  RESPIRATORY: No cough, no wheeze, no hemoptysis. No dyspnea.  CARDIOVASCULAR: No chest pain. No orthopnea. No palpitations. No syncope.  GASTROINTESTINAL: No nausea, no vomiting or diarrhea.  Positive abdominal pain. No melena or hematochezia.  GENITOURINARY: No dysuria or hematuria.  ENDOCRINE: No polyuria or nocturia. No heat or cold intolerance.  HEMATOLOGY: No anemia. No bruising. No bleeding.  INTEGUMENTARY: No rashes. No lesions.  MUSCULOSKELETAL: No arthritis. No swelling. No gout.  NEUROLOGIC: No numbness, tingling, or ataxia. No seizure-type activity.  PSYCHIATRIC: No anxiety. No insomnia. No ADD.    Vitals:   Vitals:   10/07/18 2031 10/08/18 0339 10/08/18 0931 10/08/18 1134  BP: (!) 160/92 (!) 145/74 128/61 135/74  Pulse: 68 66 60 60  Resp: 20 20    Temp: 98.4 F (36.9 C) 97.6 F (36.4 C)  98.2 F (36.8 C)  TempSrc: Oral Oral  Oral  SpO2: 96% 97%  98%  Weight:      Height:        Wt Readings from Last 3 Encounters:  10/07/18 78 kg  10/06/18 78.5 kg  09/15/18 77.1 kg     Intake/Output Summary (Last 24 hours) at 10/08/2018 1512 Last data filed at 10/08/2018  0900 Gross per 24 hour  Intake 601.25 ml  Output -  Net 601.25 ml    Physical Exam:   GENERAL: Pleasant-appearing in no apparent distress.  HEAD, EYES, EARS, NOSE AND THROAT: Atraumatic, normocephalic. Extraocular muscles are intact. Pupils equal and reactive to light. Sclerae anicteric. No conjunctival injection. No oro-pharyngeal erythema.  NECK: Supple. There is no jugular venous distention. No bruits, no lymphadenopathy, no thyromegaly.  HEART: Regular rate and rhythm,. No murmurs, no rubs, no clicks.  LUNGS: Clear to auscultation bilaterally. No rales or rhonchi. No wheezes.  ABDOMEN: Soft, flat, nontender, nondistended.  Tender Distended no guarding no rebound EXTREMITIES: No evidence of any cyanosis, clubbing, or peripheral edema.  +2 pedal and radial pulses bilaterally.  NEUROLOGIC: The patient is alert, awake, and oriented x3 with no focal motor or sensory deficits appreciated bilaterally.  SKIN: Moist and warm with no rashes appreciated.  Psych: Not anxious, depressed LN: No inguinal LN enlargement    Antibiotics   Anti-infectives (From admission, onward)   None      Medications   Scheduled Meds: . apixaban  2.5 mg Oral BID  . diltiazem  180 mg Oral Q2000  . finasteride  5 mg Oral Daily  . flecainide  100 mg Oral BID  . sodium phosphate  1 enema Rectal Once  . tamsulosin  0.4 mg Oral Q2000   Continuous Infusions: PRN Meds:.acetaminophen **OR** acetaminophen, ALPRAZolam, ondansetron **OR** ondansetron (ZOFRAN) IV   Data Review:   Micro Results No results found for this or any previous visit (from the past 240 hour(s)).  Radiology Reports Dg Abd 1 View  Result Date: 10/08/2018 CLINICAL DATA:  Colonic distention. EXAM: ABDOMEN - 1 VIEW COMPARISON:  Radiographs and CT scan of October 07, 2018. FINDINGS: Status post cholecystectomy. Dilated loops of colon are seen on the right side which is slightly improved compared to prior exam. Stool is noted in the more  distal colon. No definite small bowel dilatation is noted. IMPRESSION: Slightly improved dilatation of large bowel loops in right side of abdomen compared to prior exam. Electronically Signed   By: Marijo Conception, M.D.   On: 10/08/2018 11:55   Ct Abdomen Pelvis W Contrast  Result Date: 10/07/2018 CLINICAL DATA:  Acute generalized abdominal pain. EXAM: CT ABDOMEN AND PELVIS WITH CONTRAST TECHNIQUE: Multidetector CT imaging of the abdomen and pelvis was performed using the standard protocol following bolus administration of intravenous contrast. CONTRAST:  119mL ISOVUE-300 IOPAMIDOL (ISOVUE-300) INJECTION 61% COMPARISON:  CT scan of June 22, 2017. FINDINGS: Lower chest: No acute abnormality. Hepatobiliary: Status post cholecystectomy. No biliary dilatation is noted. Stable appendix cysts are noted. Pancreas: Unremarkable. No pancreatic ductal dilatation or surrounding inflammatory changes. Spleen: Normal in size without focal abnormality. Adrenals/Urinary Tract: Adrenal glands are unremarkable. Kidneys are normal, without renal calculi, focal lesion, or hydronephrosis. Bladder is unremarkable. Stomach/Bowel: The stomach appears normal. The appendix is not visualized. No small bowel dilatation is noted. However, there appears to be a volvulus involving the sigmoid colon with dilatation of the more proximal colon loops. Vascular/Lymphatic: Aortic atherosclerosis. No enlarged abdominal or pelvic lymph nodes. Reproductive: Stable moderate prostatic enlargement is noted. Other: Stable right inguinal hernia is noted which contains a loop of small bowel, but does not result in incarceration or obstruction. Musculoskeletal: Severe multilevel degenerative disc disease is noted in the lumbar spine. No acute osseous abnormality is noted. IMPRESSION: There appears to be twisting of the sigmoid colon concerning for volvulus resulting in mild dilatation obstruction of the more proximal colon. Sigmoidoscopy is recommended  for further evaluation. Critical Value/emergent results were called by telephone at the time of interpretation on 10/07/2018 at 10:58 am to Dr. Arta Silence , who verbally acknowledged these results. Stable moderate prostatic enlargement. Aortic Atherosclerosis (ICD10-I70.0). Electronically Signed   By: Marijo Conception, M.D.   On: 10/07/2018 10:58   Dg Abd 2 Views  Result Date: 10/07/2018 CLINICAL DATA:  No bowel movement in 9 days according to the patient, abdominal pain and bloating, history of diverticulitis EXAM: ABDOMEN - 2 VIEW COMPARISON:  CT abdomen pelvis of 06/22/2017  FINDINGS: Both large and small bowel gas is present without significant distension. Slight gaseous distention of the right colon is noted of questionable significance. There is no evidence of bowel obstruction currently. Permanent pacemaker wires remain. No opaque calculus is seen. There are degenerative changes in the lumbar spine. Surgical clips are present in the right upper quadrant from prior cholecystectomy. IMPRESSION: 1. Slight gaseous distention of the right colon, but no definite evidence of bowel obstruction is noted. 2. Diffuse degenerative change throughout the lumbar spine. Electronically Signed   By: Ivar Drape M.D.   On: 10/07/2018 09:03     CBC Recent Labs  Lab 10/07/18 0946  WBC 5.3  HGB 14.2  HCT 41.8  PLT 243  MCV 91.1  MCH 30.9  MCHC 34.0  RDW 12.6  LYMPHSABS 1.6  MONOABS 0.5  EOSABS 0.0  BASOSABS 0.0    Chemistries  Recent Labs  Lab 10/07/18 0946  NA 139  K 3.7  CL 107  CO2 23  GLUCOSE 103*  BUN 20  CREATININE 1.08  CALCIUM 9.2  AST 17  ALT 11  ALKPHOS 57  BILITOT 1.1   ------------------------------------------------------------------------------------------------------------------ estimated creatinine clearance is 55.2 mL/min (by C-G formula based on SCr of 1.08  mg/dL). ------------------------------------------------------------------------------------------------------------------ No results for input(s): HGBA1C in the last 72 hours. ------------------------------------------------------------------------------------------------------------------ No results for input(s): CHOL, HDL, LDLCALC, TRIG, CHOLHDL, LDLDIRECT in the last 72 hours. ------------------------------------------------------------------------------------------------------------------ No results for input(s): TSH, T4TOTAL, T3FREE, THYROIDAB in the last 72 hours.  Invalid input(s): FREET3 ------------------------------------------------------------------------------------------------------------------ No results for input(s): VITAMINB12, FOLATE, FERRITIN, TIBC, IRON, RETICCTPCT in the last 72 hours.  Coagulation profile No results for input(s): INR, PROTIME in the last 168 hours.  No results for input(s): DDIMER in the last 72 hours.  Cardiac Enzymes No results for input(s): CKMB, TROPONINI, MYOGLOBIN in the last 168 hours.  Invalid input(s): CK ------------------------------------------------------------------------------------------------------------------ Invalid input(s): POCBNP    Assessment & Plan   IMPRESSION AND PLAN: Patient is a 85 year old presenting with abdominal pain  1.  Sigmoid volvulus  Status post reduction by gastroenterology Patient has recurrence of symptoms surgery is following I will hold Eliquis   2.  Atrial fibrillation hold Eliquis continue all other medications   3.  BPH continue  Proscar and flomax  4. Anxiety continue Xanax  5.  Miscellaneous SCDs for DVT prophylaxis     Code Status Orders  (From admission, onward)         Start     Ordered   10/07/18 1524  Full code  Continuous     10/07/18 1523        Code Status History    This patient has a current code status but no historical code status.    Advance Directive  Documentation     Most Recent Value  Type of Advance Directive  Living will, Healthcare Power of Attorney  Pre-existing out of facility DNR order (yellow form or pink MOST form)  -  "MOST" Form in Place?  -           Consults surgery gastroenterology   DVT Prophylaxis liquid Lab Results  Component Value Date   PLT 243 10/07/2018     Time Spent in minutes   35 minutes greater than 50% of time spent in care coordination and counseling patient regarding the condition and plan of care.   Dustin Flock M.D on 10/08/2018 at 3:12 PM  Between 7am to 6pm - Pager - [phone]  After 6pm go to [email or web address] - password  [employer]  [phone]

## 2018-10-08 NOTE — Progress Notes (Signed)
Patient mentioned that he had more pain after eating lunch/pot roast.  Dr Lysle Pearl notified and order given for full liquid diet.  He is coming to see the patient

## 2018-10-09 ENCOUNTER — Encounter: Payer: Self-pay | Admitting: Gastroenterology

## 2018-10-09 ENCOUNTER — Inpatient Hospital Stay: Payer: Medicare Other

## 2018-10-09 LAB — BASIC METABOLIC PANEL
Anion gap: 5 (ref 5–15)
BUN: 14 mg/dL (ref 8–23)
CALCIUM: 8.5 mg/dL — AB (ref 8.9–10.3)
CO2: 25 mmol/L (ref 22–32)
Chloride: 108 mmol/L (ref 98–111)
Creatinine, Ser: 0.98 mg/dL (ref 0.61–1.24)
GFR calc Af Amer: >60 mL/min (ref >60)
GFR calc non Af Amer: >60 mL/min (ref >60)
Glucose, Bld: 91 mg/dL (ref 70–99)
Potassium: 3.3 mmol/L — ABNORMAL LOW (ref 3.5–5.1)
Sodium: 138 mmol/L (ref 135–145)

## 2018-10-09 LAB — GLUCOSE, CAPILLARY: Glucose-Capillary: 76 mg/dL (ref 70–99)

## 2018-10-09 MED ORDER — POLYETHYLENE GLYCOL 3350 17 G PO PACK
17.0000 g | PACK | Freq: Every day | ORAL | Status: DC
  Administered 2018-10-09 – 2018-10-12 (×4): 17 g via ORAL
  Filled 2018-10-09 (×4): qty 1

## 2018-10-09 MED ORDER — IOHEXOL 300 MG/ML  SOLN
100.0000 mL | Freq: Once | INTRAMUSCULAR | Status: AC | PRN
  Administered 2018-10-09: 100 mL via INTRAVENOUS

## 2018-10-09 MED ORDER — MORPHINE SULFATE (PF) 2 MG/ML IV SOLN
2.0000 mg | Freq: Once | INTRAVENOUS | Status: AC
  Administered 2018-10-09: 2 mg via INTRAVENOUS
  Filled 2018-10-09: qty 1

## 2018-10-09 MED ORDER — DOCUSATE SODIUM 100 MG PO CAPS
100.0000 mg | ORAL_CAPSULE | Freq: Two times a day (BID) | ORAL | Status: DC
  Administered 2018-10-09 – 2018-10-15 (×11): 100 mg via ORAL
  Filled 2018-10-09 (×11): qty 1

## 2018-10-09 MED ORDER — MORPHINE SULFATE (PF) 2 MG/ML IV SOLN
2.0000 mg | Freq: Four times a day (QID) | INTRAVENOUS | Status: DC | PRN
  Filled 2018-10-09: qty 1

## 2018-10-09 MED ORDER — POTASSIUM CHLORIDE 10 MEQ/100ML IV SOLN
10.0000 meq | INTRAVENOUS | Status: AC
  Administered 2018-10-09 (×2): 10 meq via INTRAVENOUS
  Filled 2018-10-09: qty 100

## 2018-10-09 NOTE — Progress Notes (Addendum)
SURGICAL PROGRESS NOTE (cpt: [phone])  Patient seen and examined as described below with surgical PA-C, Ardell Isaacs.  Assessment/Plan: (ICD-10's: K40.2,) 85 y.o. male with severe constipation on follow-up CT +/- questionable persistent sigmoid volvulus without evidence of ischemia 2 Days s/p sigmoidoscopic decompression without placement of rectal tube, complicated by comorbidities including advanced chronological age, chronic constipation, HTN, HLD, sick sinus syndrome s/p permanent pacemaker insertion, chronic therapeutic anticoagulation for chronic atrial fibrillation, osteoarthritis, BPH, and former tobacco abuse (smoking).   - continue to monitor abdominal examination and on-going bowel function  - clear liquids diet for now, NPO after midnight to allow for possible surgical intervention if indicated  - considering patient's abdomen soft, non-distended, and non-tender with even deep palpation, will initiate treatment for constipation with daily Miralax and BID Colace for now as long as abdominal exam does not worsen  - risks, benefits, and alternatives to sigmoid colectomy with possible ostomy were discussed with the patient , all of his questions were answered to his expressed satisfaction, patient expresses he does not currently wish to proceed, though patient expresses understanding this may become necessary and informed consent was obtained.             - appreciate medical management as per primary medical team             - ambulation encouraged     I have personally reviewed the patient's chart, evaluated/examined the patient, proposed the recommended management, and discussed these recommendations with the patient to his expressed satisfaction as well as with patient's RN and medical physician.  Thank you for the opportunity to participate in this patient's care.  -- Marilynne Drivers Rosana Hoes, MD, [place]: [facility] - Partnering for exceptional  care. Office: [facility] SURGICAL PROGRESS NOTE (cpt [phone])  Hospital Day(s): 2.   Post op day(s): 2 Days Post-Op.   Interval History: Patient seen and examined. He had a rapid response called this morning after developing tingling and numbness following morphine administration. He had no appreciable neurological deficits per rapid response assessment and his symptoms quickly resolved.   He does report that he continues to have crampy lower abdominal pain which intermittently worsens. He denied any associated nausea or emesis. He reports that his last significant bowel movement was following his sigmoid decompression on the 31st but he continues to pass gas and have small liquid BMs. Has not eaten anything this morning. No other complaints this morning.   Review of Systems:  Constitutional: denies fever, chills  HEENT: denies cough or congestion  Respiratory: denies any shortness of breath  Cardiovascular: denies chest pain or palpitations  Gastrointestinal: + abdominal pain, denied N/V, or diarrhea/and bowel function as per interval history Genitourinary: denies burning with urination or urinary frequency Neurological: + Numbness/Tingling (Resolved)  Vital signs in last 24 hours: [min-max] current  Temp:  [97.7 F (36.5 C)-98.2 F (36.8 C)] 97.7 F (36.5 C) (01/02 0807) Pulse Rate:  [60-64] 64 (01/02 0807) Resp:  [18] 18 (01/02 0554) BP: (128-162)/(61-96) 162/96 (01/02 0807) SpO2:  [97 %-100 %] 100 % (01/02 0807)     Height: 5\' 11"  (180.3 cm) Weight: 78 kg BMI (Calculated): 24   Intake/Output this shift:  No intake/output data recorded.   Intake/Output last 2 shifts:  @IOLAST2SHIFTS @   Physical Exam:  Constitutional: alert, cooperative and no distress  HENT: normocephalic without obvious abnormality  Eyes: EOM's grossly intact and symmetric  Neuro: CN II - XII grossly intact  and symmetric without deficit  Respiratory: breathing non-labored  at rest  Cardiovascular: regular rate and sinus rhythm  Gastrointestinal: soft, non-tender, and non-distended Musculoskeletal: no edema or wounds, motor and sensation grossly intact, NT   Labs:  CBC Latest Ref Rng & Units 10/07/2018 06/22/2017 12/03/2016  WBC 4.0 - 10.5 K/uL 5.3 11.9(H) 4.6  Hemoglobin 13.0 - 17.0 g/dL 14.2 15.3 14.4  Hematocrit 39.0 - 52.0 % 41.8 42.8 41.3  Platelets 150 - 400 K/uL 243 205 253.0   CMP Latest Ref Rng & Units 10/09/2018 10/07/2018 06/22/2017  Glucose 70 - 99 mg/dL 91 103(H) 142(H)  BUN 8 - 23 mg/dL 14 20 14   Creatinine 0.61 - 1.24 mg/dL 0.98 1.08 1.03  Sodium 135 - 145 mmol/L 138 139 137  Potassium 3.5 - 5.1 mmol/L 3.3(L) 3.7 4.1  Chloride 98 - 111 mmol/L 108 107 103  CO2 22 - 32 mmol/L 25 23 24   Calcium 8.9 - 10.3 mg/dL 8.5(L) 9.2 8.8(L)  Total Protein 6.5 - 8.1 g/dL - 8.2(H) 7.9  Total Bilirubin 0.3 - 1.2 mg/dL - 1.1 1.4(H)  Alkaline Phos 38 - 126 U/L - 57 59  AST 15 - 41 U/L - 17 19  ALT 0 - 44 U/L - 11 11(L)    Imaging studies:  CT Abdomen/Pelvis (10/09/2018) - personally reviewed and discussed with patient: 1. Persistent changes of a sigmoid volvulus. No pneumatosis or free air. 2. Stable numerous hepatic cysts. 3. Status post cholecystectomy but no biliary dilatation.  Assessment/Plan: (ICD-10's: K56.2,) 85 y.o. male with severe constipation on CT scan and questionable persistent sigmoid volvulus without evidence of perforation or ischemia 2 Days s/p sigmoidoscopy and decompression, complicated by pertinent comorbidities including advanced chronological age, chronic constipation, HTN, HLD, sick sinus syndrome s/p permanent pacemaker insertion, chronic therapeutic anticoagulation for chronic atrial fibrillation, osteoarthritis, BPH, and former tobacco abuse (smoking).   - Clear Liquid Diet for now and NPO after midnight (NPO sooner if pain persists or has worsening distension/Nausea/Vomitting), IVF  - Continue to monitor abdominal examination and  on-going bowel function  - Suspect there is a significant component of constipation, will give miralax and colace BID  - Will continue to monitor for recurrence/ischemia/perforation, if good surgical candidate will consider sigmoid colectomy. No indication for emergent surgical intervention currently.  - Pain control as needed  - ongoing bowel regimen including daily Colace stool softener             - medical management as per primary medical team             - ambulation encouraged     All of the above findings and recommendations were discussed with the patient, and the medical team, and all of patient's questions were answered to his expressed satisfaction.  -- Edison Simon, PA-C [facility] 10/09/2018, 9:30 AM [phone] M-F: 7am - 4pm

## 2018-10-09 NOTE — Progress Notes (Signed)
[place] at [facility]                                                                                                                                                                                  Patient Demographics   [name], is a 85 y.o. male, DOB - 1933/01/03, VOJ:[id number]  Admit date - 10/07/2018   Admitting Physician Dustin Flock, MD  Outpatient Primary MD for the patient is [name], MD   LOS - 2  Subjective: Patient had abdominal pain again this morning and last night which was severe.  Has been seen by GI and surgery    Review of Systems:   CONSTITUTIONAL: No documented fever. No fatigue, weakness. No weight gain, no weight loss.  EYES: No blurry or double vision.  ENT: No tinnitus. No postnasal drip. No redness of the oropharynx.  RESPIRATORY: No cough, no wheeze, no hemoptysis. No dyspnea.  CARDIOVASCULAR: No chest pain. No orthopnea. No palpitations. No syncope.  GASTROINTESTINAL: No nausea, no vomiting or diarrhea.  Positive abdominal pain. No melena or hematochezia.  GENITOURINARY: No dysuria or hematuria.  ENDOCRINE: No polyuria or nocturia. No heat or cold intolerance.  HEMATOLOGY: No anemia. No bruising. No bleeding.  INTEGUMENTARY: No rashes. No lesions.  MUSCULOSKELETAL: No arthritis. No swelling. No gout.  NEUROLOGIC: No numbness, tingling, or ataxia. No seizure-type activity.  PSYCHIATRIC: No anxiety. No insomnia. No ADD.    Vitals:   Vitals:   10/08/18 1945 10/09/18 0554 10/09/18 0807 10/09/18 1054  BP: 138/81 (!) 144/82 (!) 162/96 (!) 162/79  Pulse: 60 62 64 62  Resp: 18 18  18   Temp: 98.1 F (36.7 C) 97.8 F (36.6 C) 97.7 F (36.5 C) 97.8 F (36.6 C)  TempSrc: Oral Oral Oral Oral  SpO2: 97% 98% 100% 97%  Weight:      Height:        Wt Readings from Last 3 Encounters:  10/07/18 78 kg  10/06/18 78.5 kg  09/15/18 77.1 kg     Intake/Output Summary (Last 24 hours) at 10/09/2018 1439 Last data  filed at 10/09/2018 0500 Gross per 24 hour  Intake 240 ml  Output -  Net 240 ml    Physical Exam:   GENERAL: Pleasant-appearing in no apparent distress.  HEAD, EYES, EARS, NOSE AND THROAT: Atraumatic, normocephalic. Extraocular muscles are intact. Pupils equal and reactive to light. Sclerae anicteric. No conjunctival injection. No oro-pharyngeal erythema.  NECK: Supple. There is no jugular venous distention. No bruits, no lymphadenopathy, no thyromegaly.  HEART: Regular rate and rhythm,. No murmurs, no rubs, no clicks.  LUNGS: Clear to auscultation bilaterally. No rales or rhonchi. No wheezes.  ABDOMEN: Soft, flat, nontender, nondistended.  Tender Distended no guarding no rebound EXTREMITIES: No evidence of any cyanosis, clubbing, or peripheral edema.  +2 pedal and radial pulses bilaterally.  NEUROLOGIC: The patient is alert, awake, and oriented x3 with no focal motor or sensory deficits appreciated bilaterally.  SKIN: Moist and warm with no rashes appreciated.  Psych: Not anxious, depressed LN: No inguinal LN enlargement    Antibiotics   Anti-infectives (From admission, onward)   None      Medications   Scheduled Meds: . diltiazem  180 mg Oral Q2000  . docusate sodium  100 mg Oral BID  . finasteride  5 mg Oral Daily  . flecainide  100 mg Oral BID  . polyethylene glycol  17 g Oral Daily  . sodium phosphate  1 enema Rectal Once  . tamsulosin  0.4 mg Oral Q2000   Continuous Infusions: PRN Meds:.acetaminophen **OR** acetaminophen, ALPRAZolam, morphine injection, ondansetron **OR** ondansetron (ZOFRAN) IV   Data Review:   Micro Results No results found for this or any previous visit (from the past 240 hour(s)).  Radiology Reports Dg Abd 1 View  Result Date: 10/08/2018 CLINICAL DATA:  Colonic distention. EXAM: ABDOMEN - 1 VIEW COMPARISON:  Radiographs and CT scan of October 07, 2018. FINDINGS: Status post cholecystectomy. Dilated loops of colon are seen on the right side  which is slightly improved compared to prior exam. Stool is noted in the more distal colon. No definite small bowel dilatation is noted. IMPRESSION: Slightly improved dilatation of large bowel loops in right side of abdomen compared to prior exam. Electronically Signed   By: Marijo Conception, M.D.   On: 10/08/2018 11:55   Ct Abdomen Pelvis W Contrast  Result Date: 10/09/2018 CLINICAL DATA:  Abdominal pain and constipation.  Sigmoid volvulus. EXAM: CT ABDOMEN AND PELVIS WITH CONTRAST TECHNIQUE: Multidetector CT imaging of the abdomen and pelvis was performed using the standard protocol following bolus administration of intravenous contrast. CONTRAST:  116mL OMNIPAQUE IOHEXOL 300 MG/ML  SOLN COMPARISON:  CT scan 10/07/2018 FINDINGS: Lower chest: Streaky bibasilar atelectasis but no infiltrates or effusions. The heart is normal in size for age. No pericardial effusion. Pacer wires are noted. Hepatobiliary: Stable hepatic cysts. No worrisome hepatic lesions. The gallbladder is surgically absent. No common bile duct dilatation. Pancreas: No mass, inflammation or ductal dilatation. Spleen: Normal size.  No focal lesions. Adrenals/Urinary Tract: The adrenal glands and kidneys are normal in stable. The bladder is unremarkable. Stomach/Bowel: The stomach and duodenum are unremarkable. The small bowel appears normal. No acute inflammatory changes, mass lesions or obstructive findings. Large amount of stool involving the ascending, transverse and proximal descending colon. Persistent changes of a sigmoid volvulus with swirling of the mesenteric vessels and the sigmoid colon which is decompressed below the volvulus. Moderate distention of the sigmoid colon above the volvulus which is very tortuous and redundant. No findings for bowel ischemia.  No pneumatosis or free air. Vascular/Lymphatic: Moderate distal aortic and iliac artery calcifications. No aneurysm or dissection. The aortic branch vessels are patent. The major  venous structures are patent. No mesenteric or retroperitoneal mass or adenopathy. Reproductive: Markedly enlarged prostate gland. Other: Small amount of free pelvic fluid. Musculoskeletal: No significant bony findings. Left convex lumbar scoliosis with associated degenerative lumbar spondylosis and multilevel disc disease and facet disease. Stable large hemangioma occupying the L1 vertebral body is along with other smaller hemangiomas. IMPRESSION: 1. Persistent changes of a sigmoid volvulus. No pneumatosis or free air. 2. Stable numerous hepatic  cysts. 3. Status post cholecystectomy but no biliary dilatation. Electronically Signed   By: Marijo Sanes M.D.   On: 10/09/2018 09:23   Ct Abdomen Pelvis W Contrast  Result Date: 10/07/2018 CLINICAL DATA:  Acute generalized abdominal pain. EXAM: CT ABDOMEN AND PELVIS WITH CONTRAST TECHNIQUE: Multidetector CT imaging of the abdomen and pelvis was performed using the standard protocol following bolus administration of intravenous contrast. CONTRAST:  122mL ISOVUE-300 IOPAMIDOL (ISOVUE-300) INJECTION 61% COMPARISON:  CT scan of June 22, 2017. FINDINGS: Lower chest: No acute abnormality. Hepatobiliary: Status post cholecystectomy. No biliary dilatation is noted. Stable appendix cysts are noted. Pancreas: Unremarkable. No pancreatic ductal dilatation or surrounding inflammatory changes. Spleen: Normal in size without focal abnormality. Adrenals/Urinary Tract: Adrenal glands are unremarkable. Kidneys are normal, without renal calculi, focal lesion, or hydronephrosis. Bladder is unremarkable. Stomach/Bowel: The stomach appears normal. The appendix is not visualized. No small bowel dilatation is noted. However, there appears to be a volvulus involving the sigmoid colon with dilatation of the more proximal colon loops. Vascular/Lymphatic: Aortic atherosclerosis. No enlarged abdominal or pelvic lymph nodes. Reproductive: Stable moderate prostatic enlargement is noted.  Other: Stable right inguinal hernia is noted which contains a loop of small bowel, but does not result in incarceration or obstruction. Musculoskeletal: Severe multilevel degenerative disc disease is noted in the lumbar spine. No acute osseous abnormality is noted. IMPRESSION: There appears to be twisting of the sigmoid colon concerning for volvulus resulting in mild dilatation obstruction of the more proximal colon. Sigmoidoscopy is recommended for further evaluation. Critical Value/emergent results were called by telephone at the time of interpretation on 10/07/2018 at 10:58 am to Dr. Arta Silence , who verbally acknowledged these results. Stable moderate prostatic enlargement. Aortic Atherosclerosis (ICD10-I70.0). Electronically Signed   By: Marijo Conception, M.D.   On: 10/07/2018 10:58   Dg Abd 2 Views  Result Date: 10/07/2018 CLINICAL DATA:  No bowel movement in 9 days according to the patient, abdominal pain and bloating, history of diverticulitis EXAM: ABDOMEN - 2 VIEW COMPARISON:  CT abdomen pelvis of 06/22/2017 FINDINGS: Both large and small bowel gas is present without significant distension. Slight gaseous distention of the right colon is noted of questionable significance. There is no evidence of bowel obstruction currently. Permanent pacemaker wires remain. No opaque calculus is seen. There are degenerative changes in the lumbar spine. Surgical clips are present in the right upper quadrant from prior cholecystectomy. IMPRESSION: 1. Slight gaseous distention of the right colon, but no definite evidence of bowel obstruction is noted. 2. Diffuse degenerative change throughout the lumbar spine. Electronically Signed   By: Ivar Drape M.D.   On: 10/07/2018 09:03     CBC Recent Labs  Lab 10/07/18 0946  WBC 5.3  HGB 14.2  HCT 41.8  PLT 243  MCV 91.1  MCH 30.9  MCHC 34.0  RDW 12.6  LYMPHSABS 1.6  MONOABS 0.5  EOSABS 0.0  BASOSABS 0.0    Chemistries  Recent Labs  Lab  10/07/18 0946 10/09/18 0502  NA 139 138  K 3.7 3.3*  CL 107 108  CO2 23 25  GLUCOSE 103* 91  BUN 20 14  CREATININE 1.08 0.98  CALCIUM 9.2 8.5*  AST 17  --   ALT 11  --   ALKPHOS 57  --   BILITOT 1.1  --    ------------------------------------------------------------------------------------------------------------------ estimated creatinine clearance is 60.8 mL/min (by C-G formula based on SCr of 0.98 mg/dL). ------------------------------------------------------------------------------------------------------------------ No results for input(s): HGBA1C in the last 72  hours. ------------------------------------------------------------------------------------------------------------------ No results for input(s): CHOL, HDL, LDLCALC, TRIG, CHOLHDL, LDLDIRECT in the last 72 hours. ------------------------------------------------------------------------------------------------------------------ No results for input(s): TSH, T4TOTAL, T3FREE, THYROIDAB in the last 72 hours.  Invalid input(s): FREET3 ------------------------------------------------------------------------------------------------------------------ No results for input(s): VITAMINB12, FOLATE, FERRITIN, TIBC, IRON, RETICCTPCT in the last 72 hours.  Coagulation profile No results for input(s): INR, PROTIME in the last 168 hours.  No results for input(s): DDIMER in the last 72 hours.  Cardiac Enzymes No results for input(s): CKMB, TROPONINI, MYOGLOBIN in the last 168 hours.  Invalid input(s): CK ------------------------------------------------------------------------------------------------------------------ Invalid input(s): POCBNP    Assessment & Plan   IMPRESSION AND PLAN: Patient is a 85 year old presenting with abdominal pain  1.  Sigmoid volvulus  Status post reduction by gastroenterology no improvement Patient has recurrence of symptoms Surgery is following Not improving   2.  Atrial fibrillation  hold Eliquis continue all other medications Patient's last dose of Eliquis was yesterday.  After 48 hours while she can be started on heparin if no surgery planned  3.  BPH continue  Proscar and flomax  4. Anxiety continue Xanax  5.  Miscellaneous SCDs for DVT prophylaxis     Code Status Orders  (From admission, onward)         Start     Ordered   10/07/18 1524  Full code  Continuous     10/07/18 1523        Code Status History    This patient has a current code status but no historical code status.    Advance Directive Documentation     Most Recent Value  Type of Advance Directive  Living will, Healthcare Power of Attorney  Pre-existing out of facility DNR order (yellow form or pink MOST form)  -  "MOST" Form in Place?  -           Consults surgery gastroenterology   DVT Prophylaxis liquid Lab Results  Component Value Date   PLT 243 10/07/2018     Time Spent in minutes   35 minutes greater than 50% of time spent in care coordination and counseling patient regarding the condition and plan of care.   Dustin Flock M.D on 10/09/2018 at 2:39 PM  Between 7am to 6pm - Pager - [phone]  After 6pm go to [email or web address] - Proofreader  [employer]   Office  [phone]

## 2018-10-09 NOTE — Progress Notes (Signed)
Lucilla Lame, MD [facility]   [address], [place], [place] [address] Phone: [phone] Fax : [phone]   Subjective: The patient has persistent abdominal pain that is only better now since he got some pain medication.  The patient had a episode that he reports this morning to have been a result from his reaction to the pain medication.  The patient had a CT scan that showed persistent dilation of the colon proximal to the volvulus with what appears to be continued volvulus.  He states that he is less bloated than he was yesterday but still continues to have abdominal discomfort.   Objective: Vital signs in last 24 hours: Vitals:   10/08/18 1945 10/09/18 0554 10/09/18 0807 10/09/18 1054  BP: 138/81 (!) 144/82 (!) 162/96 (!) 162/79  Pulse: 60 62 64 62  Resp: 18 18  18   Temp: 98.1 F (36.7 C) 97.8 F (36.6 C) 97.7 F (36.5 C) 97.8 F (36.6 C)  TempSrc: Oral Oral Oral Oral  SpO2: 97% 98% 100% 97%  Weight:      Height:       Weight change:   Intake/Output Summary (Last 24 hours) at 10/09/2018 1236 Last data filed at 10/09/2018 0500 Gross per 24 hour  Intake 240 ml  Output -  Net 240 ml     Exam: Heart:: Regular rate and rhythm, S1S2 present or without murmur or extra heart sounds Lungs: normal and clear to auscultation and percussion Abdomen: Abdomen soft nondistended positive bowel sounds without rebound or guarding   Lab Results: @LABTEST2 @ Micro Results: No results found for this or any previous visit (from the past 240 hour(s)). Studies/Results: Dg Abd 1 View  Result Date: 10/08/2018 CLINICAL DATA:  Colonic distention. EXAM: ABDOMEN - 1 VIEW COMPARISON:  Radiographs and CT scan of October 07, 2018. FINDINGS: Status post cholecystectomy. Dilated loops of colon are seen on the right side which is slightly improved compared to prior exam. Stool is noted in the more distal colon. No definite small bowel dilatation is noted. IMPRESSION: Slightly improved dilatation  of large bowel loops in right side of abdomen compared to prior exam. Electronically Signed   By: Marijo Conception, M.D.   On: 10/08/2018 11:55   Ct Abdomen Pelvis W Contrast  Result Date: 10/09/2018 CLINICAL DATA:  Abdominal pain and constipation.  Sigmoid volvulus. EXAM: CT ABDOMEN AND PELVIS WITH CONTRAST TECHNIQUE: Multidetector CT imaging of the abdomen and pelvis was performed using the standard protocol following bolus administration of intravenous contrast. CONTRAST:  145mL OMNIPAQUE IOHEXOL 300 MG/ML  SOLN COMPARISON:  CT scan 10/07/2018 FINDINGS: Lower chest: Streaky bibasilar atelectasis but no infiltrates or effusions. The heart is normal in size for age. No pericardial effusion. Pacer wires are noted. Hepatobiliary: Stable hepatic cysts. No worrisome hepatic lesions. The gallbladder is surgically absent. No common bile duct dilatation. Pancreas: No mass, inflammation or ductal dilatation. Spleen: Normal size.  No focal lesions. Adrenals/Urinary Tract: The adrenal glands and kidneys are normal in stable. The bladder is unremarkable. Stomach/Bowel: The stomach and duodenum are unremarkable. The small bowel appears normal. No acute inflammatory changes, mass lesions or obstructive findings. Large amount of stool involving the ascending, transverse and proximal descending colon. Persistent changes of a sigmoid volvulus with swirling of the mesenteric vessels and the sigmoid colon which is decompressed below the volvulus. Moderate distention of the sigmoid colon above the volvulus which is very tortuous and redundant. No findings for bowel ischemia.  No pneumatosis or free air. Vascular/Lymphatic:  Moderate distal aortic and iliac artery calcifications. No aneurysm or dissection. The aortic branch vessels are patent. The major venous structures are patent. No mesenteric or retroperitoneal mass or adenopathy. Reproductive: Markedly enlarged prostate gland. Other: Small amount of free pelvic fluid.  Musculoskeletal: No significant bony findings. Left convex lumbar scoliosis with associated degenerative lumbar spondylosis and multilevel disc disease and facet disease. Stable large hemangioma occupying the L1 vertebral body is along with other smaller hemangiomas. IMPRESSION: 1. Persistent changes of a sigmoid volvulus. No pneumatosis or free air. 2. Stable numerous hepatic cysts. 3. Status post cholecystectomy but no biliary dilatation. Electronically Signed   By: Marijo Sanes M.D.   On: 10/09/2018 09:23   Medications: I have reviewed the patient's current medications. Scheduled Meds: . diltiazem  180 mg Oral Q2000  . finasteride  5 mg Oral Daily  . flecainide  100 mg Oral BID  . sodium phosphate  1 enema Rectal Once  . tamsulosin  0.4 mg Oral Q2000   Continuous Infusions: PRN Meds:.acetaminophen **OR** acetaminophen, ALPRAZolam, morphine injection, ondansetron **OR** ondansetron (ZOFRAN) IV   Assessment: Active Problems:   Abdominal pain   Sigmoid volvulus ([place])    Plan: This patient has recurrent sigmoid volvulus with a CT scan this morning showing him to have continued torsion of his sigmoid mesentery.  The patient is being followed by surgery at this time.  Repeat sigmoidoscopies are unlikely to solve this patient's recurrent problem after being decompressed and untwisted 2 days ago.  Further recommendations per surgery.  I will sign off.  Please call if any further GI concerns or questions.  We would like to thank you for the opportunity to participate in the care of [employer].     LOS: 2 days   Lucilla Lame 10/09/2018, 12:36 PM

## 2018-10-09 NOTE — Progress Notes (Signed)
Around 8:00 called and c/o abdominal pain that radiates to the back. MD was notified and morphine was ordered. Few mins. later, pt called back and said that he needed some help and pain was getting worse. C/o weird feeling, sensation, can't get his breath due to severe pain. Then he started shaking. Rapid response was called. This RN was attempting to give morphine again until  pt stated" I might be allergic to morphine that was given to me early this am about 5:00 am". Therefore, morphine was held. MD ordered dilaudid but pt.refused. He is scared to get any stronger IV pain. New order received.

## 2018-10-09 NOTE — Significant Event (Signed)
Rapid Response Event Note  Overview: Time Called: 8206 Arrival Time: 0800 Event Type: Neurologic  Initial Focused Assessment: Rapid response RN arrived in patient's room with patient sitting on the side of the bed. Patient appeared slightly anxious and was reporting pain that had began in his belly and spread to "all over". Per patient's nurse she had come in to administer morphine for patient's pain and was shaking and reporting blurred vision. Patient had received morphine around 5 am. Patient reports that his pain had just been in his stomach before the morphine dose but had spread and he had felt numbness and the blurred vision after the morphine administration. The patient reported as the time from administration increased, the symptoms of blurred vision and numbness decreased and were gone by this RNs assessment. Patient did have very slight shakiness when this RN arrived but it disappeared as assessment progressed. Patient's vital signs: 100% oxygen saturations on room air, no shortness of breath, RR 22, HR 67 and regular, BP 162/96, MAP 105. No neurological deficits noted. Patient more relaxed and calm as assessment progressed and reported he just wanted to go back to bed and rest.  Interventions: Warm blankets provided for patient.  Plan of Care (if not transferred):  Patient to remain on [place] for now. Patient's RN, Annabella, held morphine. Patient already received tylenol and reports that "he would rather have the pain than more morphine". Warm blankets applied to patient and patient helped back into to bed so he can rest.  Event Summary: Name of Physician Notified: Dr. Posey Pronto at 0800    at    Outcome: Stayed in room and stabalized  Event End Time: [place], [place]

## 2018-10-10 LAB — BASIC METABOLIC PANEL
ANION GAP: 10 (ref 5–15)
BUN: 11 mg/dL (ref 8–23)
CHLORIDE: 103 mmol/L (ref 98–111)
CO2: 25 mmol/L (ref 22–32)
Calcium: 9.1 mg/dL (ref 8.9–10.3)
Creatinine, Ser: 1.13 mg/dL (ref 0.61–1.24)
GFR calc Af Amer: >60 mL/min (ref >60)
GFR calc non Af Amer: 60 mL/min — ABNORMAL LOW (ref >60)
Glucose, Bld: 99 mg/dL (ref 70–99)
POTASSIUM: 3.6 mmol/L (ref 3.5–5.1)
Sodium: 138 mmol/L (ref 135–145)

## 2018-10-10 LAB — CBC
HEMATOCRIT: 38.4 % — AB (ref 39.0–52.0)
Hemoglobin: 13.1 g/dL (ref 13.0–17.0)
MCH: 31.4 pg (ref 26.0–34.0)
MCHC: 34.1 g/dL (ref 30.0–36.0)
MCV: 92.1 fL (ref 80.0–100.0)
Platelets: 225 10*3/uL (ref 150–400)
RBC: 4.17 MIL/uL — ABNORMAL LOW (ref 4.22–5.81)
RDW: 12.5 % (ref 11.5–15.5)
WBC: 4.8 10*3/uL (ref 4.0–10.5)
nRBC: 0 % (ref 0.0–0.2)

## 2018-10-10 LAB — PROTIME-INR
INR: 1.21
Prothrombin Time: 15.2 seconds (ref 11.4–15.2)

## 2018-10-10 LAB — APTT: aPTT: 34 seconds (ref 24–36)

## 2018-10-10 MED ORDER — MAGNESIUM CITRATE PO SOLN
1.0000 | Freq: Once | ORAL | Status: AC
  Administered 2018-10-10: 1 via ORAL
  Filled 2018-10-10: qty 296

## 2018-10-10 MED ORDER — HEPARIN BOLUS VIA INFUSION
3900.0000 [IU] | Freq: Once | INTRAVENOUS | Status: AC
  Administered 2018-10-10: 3900 [IU] via INTRAVENOUS
  Filled 2018-10-10: qty 3900

## 2018-10-10 MED ORDER — HYDROMORPHONE HCL 1 MG/ML IJ SOLN: 0.5000 mg | INTRAMUSCULAR | Status: DC | PRN

## 2018-10-10 MED ORDER — HEPARIN (PORCINE) 25000 UT/250ML-% IV SOLN
1100.0000 [IU]/h | INTRAVENOUS | Status: DC
  Administered 2018-10-10: 1100 [IU]/h via INTRAVENOUS
  Filled 2018-10-10: qty 250

## 2018-10-10 MED ORDER — HYDROCODONE-ACETAMINOPHEN 5-325 MG PO TABS
1.0000 | ORAL_TABLET | Freq: Four times a day (QID) | ORAL | Status: DC | PRN
  Administered 2018-10-10 – 2018-10-11 (×4): 1 via ORAL
  Filled 2018-10-10 (×4): qty 1

## 2018-10-10 NOTE — Progress Notes (Addendum)
SURGICAL PROGRESS NOTE (cpt: [phone])  Patient seen and examined as described below with surgical PA-C, Ardell Isaacs.  Assessment/Plan: (ICD-10's: K72.2,) 85 y.o. male with severe constipation on follow-up CT +/- questionable persistent sigmoid volvulus without evidence of ischemia s/p sigmoidoscopic decompression without placement of rectal tube, complicated by comorbidities including advanced chronological age, chronic constipation, HTN, HLD, sick sinus syndrome s/p permanent pacemaker insertion, chronic therapeutic anticoagulation for chronic atrial fibrillation, osteoarthritis, BPH, and former tobacco abuse (smoking).              - continue clear liquids diet for now             - monitor abdominal examination and on-going bowel function             - considering patient's abdomen soft, non-distended, and non-tender with even deep palpation, will continue to treat severe constipation (magnesium citrate today) and BID Colace as long as abdominal exam does not worsen, bowel prep tomorrow (GoLytely, lactulose, or Miralax 17g)             - risks, benefits, and alternatives to sigmoid colectomy with possible ostomy were discussed with the patient , all of his questions were answered to his expressed satisfaction, patient expresses he does not currently wish to proceed, though patient expresses understanding this may become necessary and informed consent was obtained. - appreciate medical managementasper primarymedicalteam  - anticipate discharge ~Monday if continues to improve - ambulation encouraged              I have personally reviewed the patient's chart, evaluated/examined the patient, proposed the recommended management, and discussed these recommendations with the patient to his expressed satisfaction as well as with patient's RN and medical physician.  Thank you for the opportunity to participate in this patient's  care.  -- Marilynne Drivers Rosana Hoes, MD, [place]: [facility] - Partnering for exceptional care. Office: [facility] SURGICAL PROGRESS NOTE (cpt [phone])  Hospital Day(s): 3.   Post op day(s): 3 Days Post-Op.   Interval History: Patient seen and examined, no acute events or new complaints overnight. Patient reports his abdominal pain has continued to improve with fewer and less severe "flare ups" of cramping abdominal pain, what he describes to be the same as he's experienced for years with constipation. He denies any fever, chills, nausea, or emesis. He tolerated a clear liquid diet yesterday without worsening pain. He continues to endorse passing flatus and liquid BMs, but no significant formed BM.  Review of Systems:  Constitutional: denies fever, chills  HEENT: denies cough or congestion  Respiratory: denies any shortness of breath  Cardiovascular: denies chest pain or palpitations  Gastrointestinal: improved abdominal pain, denies N/V, bowel function as per interval history Genitourinary: denies burning with urination or urinary frequency  Vital signs in last 24 hours: [min-max] current  Temp:  [97.7 F (36.5 C)-98 F (36.7 C)] 97.7 F (36.5 C) (01/03 0418) Pulse Rate:  [61-63] 63 (01/03 0418) Resp:  [18-20] 20 (01/03 0418) BP: (151-168)/(75-85) 151/75 (01/03 0418) SpO2:  [93 %-97 %] 93 % (01/03 0418)     Height: 5\' 11"  (180.3 cm) Weight: 78 kg BMI (Calculated): 24   Intake/Output this shift:  No intake/output data recorded.   Intake/Output last 2 shifts:  @IOLAST2SHIFTS @   Physical Exam:  Constitutional: alert, cooperative and no distress  HENT: normocephalic without obvious abnormality  Eyes: EOM's grossly intact and symmetric  Respiratory: breathing non-labored at rest  Gastrointestinal:  soft and completely non-tender and non-distended, no rebound or guarding Musculoskeletal: no edema or wounds, motor  and sensation grossly intact, NT   Labs:  CBC Latest Ref Rng & Units 10/07/2018 06/22/2017 12/03/2016  WBC 4.0 - 10.5 K/uL 5.3 11.9(H) 4.6  Hemoglobin 13.0 - 17.0 g/dL 14.2 15.3 14.4  Hematocrit 39.0 - 52.0 % 41.8 42.8 41.3  Platelets 150 - 400 K/uL 243 205 253.0   CMP Latest Ref Rng & Units 10/09/2018 10/07/2018 06/22/2017  Glucose 70 - 99 mg/dL 91 103(H) 142(H)  BUN 8 - 23 mg/dL 14 20 14   Creatinine 0.61 - 1.24 mg/dL 0.98 1.08 1.03  Sodium 135 - 145 mmol/L 138 139 137  Potassium 3.5 - 5.1 mmol/L 3.3(L) 3.7 4.1  Chloride 98 - 111 mmol/L 108 107 103  CO2 22 - 32 mmol/L 25 23 24   Calcium 8.9 - 10.3 mg/dL 8.5(L) 9.2 8.8(L)  Total Protein 6.5 - 8.1 g/dL - 8.2(H) 7.9  Total Bilirubin 0.3 - 1.2 mg/dL - 1.1 1.4(H)  Alkaline Phos 38 - 126 U/L - 57 59  AST 15 - 41 U/L - 17 19  ALT 0 - 44 U/L - 11 11(L)   Imaging studies: No new pertinent imaging studies  Assessment/Plan: (ICD-10's: K58.2) 85 y.o. male with severe constipation on follow-up CT (1/2) +/- questionable persistent sigmoid volvulus without evidence of ischemia who is clinically doing well 3 Days s/p sigmoidoscopic decompression without placement of rectal tube, complicated by comorbidities including advanced chronological age, chronic constipation, HTN, HLD, sick sinus syndrome s/p permanent pacemaker insertion, chronic therapeutic anticoagulation for chronic atrial fibrillation, osteoarthritis, BPH, and former tobacco abuse (smoking).   - Continue clear liquid diet (NPO if pain worsens acutely) +/- IVF  - Continue Miralax, BID Colace, and add magnesium citrate to bowel regimen today  - Continue to monitor abdominal examination and on-going bowel function   - No indication for emergent surgical intervention. Patient does understand if he clinically deteriorates or his symptoms on presentation return he may require surgical intervention and possible colostomy.   - Pain control as needed   - Mobilize  - Medical management per primary  team    All of the above findings and recommendations were discussed with the patient, and the medical team, and all of patient's questions were answered to his expressed satisfaction.  -- Edison Simon, PA-C [facility] 10/10/2018, 9:12 AM [phone] M-F: 7am - 4pm

## 2018-10-10 NOTE — Care Management Important Message (Signed)
Copy of signed Medicare IM left with patient in room. 

## 2018-10-10 NOTE — Plan of Care (Signed)
  Problem: Education: Goal: Knowledge of General Education information will improve Description Including pain rating scale, medication(s)/side effects and non-pharmacologic comfort measures Outcome: Progressing   Problem: Health Behavior/Discharge Planning: Goal: Ability to manage health-related needs will improve Outcome: Progressing   Problem: Clinical Measurements: Goal: Ability to maintain clinical measurements within normal limits will improve Outcome: Progressing Goal: Will remain free from infection Outcome: Progressing Goal: Diagnostic test results will improve Outcome: Progressing Goal: Respiratory complications will improve Outcome: Progressing Goal: Cardiovascular complication will be avoided Outcome: Progressing   Problem: Activity: Goal: Risk for activity intolerance will decrease Outcome: Progressing Patient ambulated several times in the halls without assistance.

## 2018-10-10 NOTE — Consult Note (Signed)
ANTICOAGULATION CONSULT NOTE - Initial Consult  Pharmacy Consult for Heparin infusion Indication: atrial fibrillation  Allergies  Allergen Reactions  . Morphine And Related     Shaking, back pain.    Patient Measurements: Height: 5\' 11"  (180.3 cm) Weight: 172 lb (78 kg) IBW/kg (Calculated) : 75.3 Heparin Dosing Weight: 78  Vital Signs: Temp: 98.1 F (36.7 C) (01/03 1250) Temp Source: Oral (01/03 1250) BP: 127/73 (01/03 1250) Pulse Rate: 66 (01/03 1250)  Labs: Recent Labs    10/09/18 0502 10/10/18 1055  CREATININE 0.98 1.13    Estimated Creatinine Clearance: 52.8 mL/min (by C-G formula based on SCr of 1.13 mg/dL).   Medical History: Past Medical History:  Diagnosis Date  . Acute diverticulitis 12/15   [facility]  . Acute diverticulitis   . Allergic rhinitis   . Atrial fibrillation or flutter 12/10   Has been paroxysmal. Patient has not tolerated coumadin due to headaches. ECHO (12/10) Mild LVH, EF 55%,   . BPH (benign prostatic hyperplasia)   . Chest pain 12/10   Lexiscan myoview with EF 55%, no ischemia or infarction  . Dysrhythmia    AFIB  . HOH (hard of hearing)   . Hyperlipemia   . Hypertension   . Osteoarthritis   . Pacemaker  medtronic 02/06/2010   Qualifier: Diagnosis of  By: Lovena Le, MD, [facility], Binnie Kand   . Presence of permanent cardiac pacemaker   . Sick sinus syndrome (HCC)    S/P dual chamber Medtronic PCM    Medications:  Medications Prior to Admission  Medication Sig Dispense Refill Last Dose  . ALPRAZolam (XANAX) 0.25 MG tablet TAKE 1 TABLET BY MOUTH TWICE DAILY AS NEEDED FOR ANXIETY 60 tablet 0 Past Month at Unknown time  . apixaban (ELIQUIS) 2.5 MG TABS tablet Take 2.5 mg by mouth 2 (two) times daily.   10/06/2018 at 2000  . ciprofloxacin (CIPRO) 250 MG tablet Take 1 tablet (250 mg total) by mouth 2 (two) times daily. 14 tablet 0 10/06/2018 at 2000  . diltiazem (TIAZAC) 180 MG 24 hr capsule Take 180 mg by mouth daily.   10/06/2018 at 2000   . finasteride (PROSCAR) 5 MG tablet Take 1 tablet (5 mg total) by mouth daily. 90 tablet 3 10/06/2018 at 0800  . flecainide (TAMBOCOR) 100 MG tablet Take 1 tablet (100 mg total) by mouth 2 (two) times daily. 180 tablet 3 10/06/2018 at 200  . Glucosamine 500 MG CAPS Take by mouth daily.     10/06/2018 at 0800  . ibuprofen (ADVIL,MOTRIN) 200 MG tablet Take 200 mg by mouth every 6 (six) hours as needed for moderate pain.   10/07/2018 at 0400  . metroNIDAZOLE (FLAGYL) 500 MG tablet Take 1 tablet (500 mg total) by mouth 3 (three) times daily. 21 tablet 0 10/06/2018 at 2000  . tamsulosin (FLOMAX) 0.4 MG CAPS capsule TAKE ONE CAPSULE BY MOUTH EVERY DAY 30 capsule 11 10/06/2018 at 2000  . acetaminophen (TYLENOL) 325 MG tablet Take 650 mg by mouth every 6 (six) hours as needed. Reported on 01/17/2016   Not Taking at Unknown time    Assessment: 85 y.o. male with a known history of atrial fibrillation, BPH, hyperlipidemia, hypertension, osteoarthritis who is presenting with constipation for 9 days as well as abdominal pain.  Patient had endoscopic decompression.  Now with possible need for sigmoid colectomy.  Due to this potential need patient had Apixiban held since 1/1 @ 0930 being the last dose.  Patient at present time not wanting to  proceed with procedure but understanding that if he clinically deteriorates will need to have this completed.  Therefore the patient will be started on a heparin infusion.  Goal of Therapy:  Heparin level 0.3-0.7 units/ml Monitor platelets by anticoagulation protocol: Yes   Plan:  Since it has been about 48 hours since last dose of apixaban and aPTT - 34 will:  Give 3900 units bolus x 1 Start heparin infusion at 1100 units/hr Check heparin level every 8 hours until two consecutive therapeutic levels then daily and CBC daily while on heparin Continue to monitor H&H and platelets  Forrest Moron, PharmD Clinical Pharmacist 10/10/2018,2:15 PM

## 2018-10-10 NOTE — Progress Notes (Signed)
[place] at [facility]                                                                                                                                                                                  Patient Demographics   [name], is a 85 y.o. male, DOB - 03/29/1933, HDQ:[id number]  Admit date - 10/07/2018   Admitting Physician Dustin Flock, MD  Outpatient Primary MD for the patient is [name], MD   LOS - 3  Subjective: Patient continues to have waxing and waning abdominal pain  Review of Systems:   CONSTITUTIONAL: No documented fever. No fatigue, weakness. No weight gain, no weight loss.  EYES: No blurry or double vision.  ENT: No tinnitus. No postnasal drip. No redness of the oropharynx.  RESPIRATORY: No cough, no wheeze, no hemoptysis. No dyspnea.  CARDIOVASCULAR: No chest pain. No orthopnea. No palpitations. No syncope.  GASTROINTESTINAL: No nausea, no vomiting or diarrhea.  Positive abdominal pain. No melena or hematochezia.  GENITOURINARY: No dysuria or hematuria.  ENDOCRINE: No polyuria or nocturia. No heat or cold intolerance.  HEMATOLOGY: No anemia. No bruising. No bleeding.  INTEGUMENTARY: No rashes. No lesions.  MUSCULOSKELETAL: No arthritis. No swelling. No gout.  NEUROLOGIC: No numbness, tingling, or ataxia. No seizure-type activity.  PSYCHIATRIC: No anxiety. No insomnia. No ADD.    Vitals:   Vitals:   10/09/18 1054 10/09/18 2101 10/10/18 0418 10/10/18 1250  BP: (!) 162/79 (!) 168/85 (!) 151/75 127/73  Pulse: 62 61 63 66  Resp: 18 20 20 16   Temp: 97.8 F (36.6 C) 98 F (36.7 C) 97.7 F (36.5 C) 98.1 F (36.7 C)  TempSrc: Oral Oral Oral Oral  SpO2: 97% 97% 93% 98%  Weight:      Height:        Wt Readings from Last 3 Encounters:  10/07/18 78 kg  10/06/18 78.5 kg  09/15/18 77.1 kg     Intake/Output Summary (Last 24 hours) at 10/10/2018 1413 Last data filed at 10/10/2018 1194 Gross per 24 hour  Intake 0 ml   Output -  Net 0 ml    Physical Exam:   GENERAL: Pleasant-appearing in no apparent distress.  HEAD, EYES, EARS, NOSE AND THROAT: Atraumatic, normocephalic. Extraocular muscles are intact. Pupils equal and reactive to light. Sclerae anicteric. No conjunctival injection. No oro-pharyngeal erythema.  NECK: Supple. There is no jugular venous distention. No bruits, no lymphadenopathy, no thyromegaly.  HEART: Regular rate and rhythm,. No murmurs, no rubs, no clicks.  LUNGS: Clear to auscultation bilaterally. No rales or rhonchi. No wheezes.  ABDOMEN: Soft, flat, nontender, nondistended.  Tender Distended no guarding no rebound EXTREMITIES:  No evidence of any cyanosis, clubbing, or peripheral edema.  +2 pedal and radial pulses bilaterally.  NEUROLOGIC: The patient is alert, awake, and oriented x3 with no focal motor or sensory deficits appreciated bilaterally.  SKIN: Moist and warm with no rashes appreciated.  Psych: Not anxious, depressed LN: No inguinal LN enlargement    Antibiotics   Anti-infectives (From admission, onward)   None      Medications   Scheduled Meds: . diltiazem  180 mg Oral Q2000  . docusate sodium  100 mg Oral BID  . finasteride  5 mg Oral Daily  . flecainide  100 mg Oral BID  . polyethylene glycol  17 g Oral Daily  . sodium phosphate  1 enema Rectal Once  . tamsulosin  0.4 mg Oral Q2000   Continuous Infusions: PRN Meds:.acetaminophen **OR** acetaminophen, ALPRAZolam, HYDROcodone-acetaminophen, HYDROmorphone (DILAUDID) injection, ondansetron **OR** ondansetron (ZOFRAN) IV   Data Review:   Micro Results No results found for this or any previous visit (from the past 240 hour(s)).  Radiology Reports Dg Abd 1 View  Result Date: 10/08/2018 CLINICAL DATA:  Colonic distention. EXAM: ABDOMEN - 1 VIEW COMPARISON:  Radiographs and CT scan of October 07, 2018. FINDINGS: Status post cholecystectomy. Dilated loops of colon are seen on the right side which is  slightly improved compared to prior exam. Stool is noted in the more distal colon. No definite small bowel dilatation is noted. IMPRESSION: Slightly improved dilatation of large bowel loops in right side of abdomen compared to prior exam. Electronically Signed   By: Marijo Conception, M.D.   On: 10/08/2018 11:55   Ct Abdomen Pelvis W Contrast  Result Date: 10/09/2018 CLINICAL DATA:  Abdominal pain and constipation.  Sigmoid volvulus. EXAM: CT ABDOMEN AND PELVIS WITH CONTRAST TECHNIQUE: Multidetector CT imaging of the abdomen and pelvis was performed using the standard protocol following bolus administration of intravenous contrast. CONTRAST:  11mL OMNIPAQUE IOHEXOL 300 MG/ML  SOLN COMPARISON:  CT scan 10/07/2018 FINDINGS: Lower chest: Streaky bibasilar atelectasis but no infiltrates or effusions. The heart is normal in size for age. No pericardial effusion. Pacer wires are noted. Hepatobiliary: Stable hepatic cysts. No worrisome hepatic lesions. The gallbladder is surgically absent. No common bile duct dilatation. Pancreas: No mass, inflammation or ductal dilatation. Spleen: Normal size.  No focal lesions. Adrenals/Urinary Tract: The adrenal glands and kidneys are normal in stable. The bladder is unremarkable. Stomach/Bowel: The stomach and duodenum are unremarkable. The small bowel appears normal. No acute inflammatory changes, mass lesions or obstructive findings. Large amount of stool involving the ascending, transverse and proximal descending colon. Persistent changes of a sigmoid volvulus with swirling of the mesenteric vessels and the sigmoid colon which is decompressed below the volvulus. Moderate distention of the sigmoid colon above the volvulus which is very tortuous and redundant. No findings for bowel ischemia.  No pneumatosis or free air. Vascular/Lymphatic: Moderate distal aortic and iliac artery calcifications. No aneurysm or dissection. The aortic branch vessels are patent. The major venous  structures are patent. No mesenteric or retroperitoneal mass or adenopathy. Reproductive: Markedly enlarged prostate gland. Other: Small amount of free pelvic fluid. Musculoskeletal: No significant bony findings. Left convex lumbar scoliosis with associated degenerative lumbar spondylosis and multilevel disc disease and facet disease. Stable large hemangioma occupying the L1 vertebral body is along with other smaller hemangiomas. IMPRESSION: 1. Persistent changes of a sigmoid volvulus. No pneumatosis or free air. 2. Stable numerous hepatic cysts. 3. Status post cholecystectomy but no biliary dilatation. Electronically Signed  By: Marijo Sanes M.D.   On: 10/09/2018 09:23   Ct Abdomen Pelvis W Contrast  Result Date: 10/07/2018 CLINICAL DATA:  Acute generalized abdominal pain. EXAM: CT ABDOMEN AND PELVIS WITH CONTRAST TECHNIQUE: Multidetector CT imaging of the abdomen and pelvis was performed using the standard protocol following bolus administration of intravenous contrast. CONTRAST:  151mL ISOVUE-300 IOPAMIDOL (ISOVUE-300) INJECTION 61% COMPARISON:  CT scan of June 22, 2017. FINDINGS: Lower chest: No acute abnormality. Hepatobiliary: Status post cholecystectomy. No biliary dilatation is noted. Stable appendix cysts are noted. Pancreas: Unremarkable. No pancreatic ductal dilatation or surrounding inflammatory changes. Spleen: Normal in size without focal abnormality. Adrenals/Urinary Tract: Adrenal glands are unremarkable. Kidneys are normal, without renal calculi, focal lesion, or hydronephrosis. Bladder is unremarkable. Stomach/Bowel: The stomach appears normal. The appendix is not visualized. No small bowel dilatation is noted. However, there appears to be a volvulus involving the sigmoid colon with dilatation of the more proximal colon loops. Vascular/Lymphatic: Aortic atherosclerosis. No enlarged abdominal or pelvic lymph nodes. Reproductive: Stable moderate prostatic enlargement is noted. Other:  Stable right inguinal hernia is noted which contains a loop of small bowel, but does not result in incarceration or obstruction. Musculoskeletal: Severe multilevel degenerative disc disease is noted in the lumbar spine. No acute osseous abnormality is noted. IMPRESSION: There appears to be twisting of the sigmoid colon concerning for volvulus resulting in mild dilatation obstruction of the more proximal colon. Sigmoidoscopy is recommended for further evaluation. Critical Value/emergent results were called by telephone at the time of interpretation on 10/07/2018 at 10:58 am to Dr. Arta Silence , who verbally acknowledged these results. Stable moderate prostatic enlargement. Aortic Atherosclerosis (ICD10-I70.0). Electronically Signed   By: Marijo Conception, M.D.   On: 10/07/2018 10:58   Dg Abd 2 Views  Result Date: 10/07/2018 CLINICAL DATA:  No bowel movement in 9 days according to the patient, abdominal pain and bloating, history of diverticulitis EXAM: ABDOMEN - 2 VIEW COMPARISON:  CT abdomen pelvis of 06/22/2017 FINDINGS: Both large and small bowel gas is present without significant distension. Slight gaseous distention of the right colon is noted of questionable significance. There is no evidence of bowel obstruction currently. Permanent pacemaker wires remain. No opaque calculus is seen. There are degenerative changes in the lumbar spine. Surgical clips are present in the right upper quadrant from prior cholecystectomy. IMPRESSION: 1. Slight gaseous distention of the right colon, but no definite evidence of bowel obstruction is noted. 2. Diffuse degenerative change throughout the lumbar spine. Electronically Signed   By: Ivar Drape M.D.   On: 10/07/2018 09:03     CBC Recent Labs  Lab 10/07/18 0946  WBC 5.3  HGB 14.2  HCT 41.8  PLT 243  MCV 91.1  MCH 30.9  MCHC 34.0  RDW 12.6  LYMPHSABS 1.6  MONOABS 0.5  EOSABS 0.0  BASOSABS 0.0    Chemistries  Recent Labs  Lab 10/07/18 0946  10/09/18 0502 10/10/18 1055  NA 139 138 138  K 3.7 3.3* 3.6  CL 107 108 103  CO2 23 25 25   GLUCOSE 103* 91 99  BUN 20 14 11   CREATININE 1.08 0.98 1.13  CALCIUM 9.2 8.5* 9.1  AST 17  --   --   ALT 11  --   --   ALKPHOS 57  --   --   BILITOT 1.1  --   --    ------------------------------------------------------------------------------------------------------------------ estimated creatinine clearance is 52.8 mL/min (by C-G formula based on SCr of 1.13 mg/dL). ------------------------------------------------------------------------------------------------------------------  No results for input(s): HGBA1C in the last 72 hours. ------------------------------------------------------------------------------------------------------------------ No results for input(s): CHOL, HDL, LDLCALC, TRIG, CHOLHDL, LDLDIRECT in the last 72 hours. ------------------------------------------------------------------------------------------------------------------ No results for input(s): TSH, T4TOTAL, T3FREE, THYROIDAB in the last 72 hours.  Invalid input(s): FREET3 ------------------------------------------------------------------------------------------------------------------ No results for input(s): VITAMINB12, FOLATE, FERRITIN, TIBC, IRON, RETICCTPCT in the last 72 hours.  Coagulation profile No results for input(s): INR, PROTIME in the last 168 hours.  No results for input(s): DDIMER in the last 72 hours.  Cardiac Enzymes No results for input(s): CKMB, TROPONINI, MYOGLOBIN in the last 168 hours.  Invalid input(s): CK ------------------------------------------------------------------------------------------------------------------ Invalid input(s): POCBNP    Assessment & Plan   IMPRESSION AND PLAN: Patient is a 85 year old presenting with abdominal pain  1.  Sigmoid volvulus  Status post reduction by gastroenterology no improvement Patient has recurrence of symptoms Surgery is  following Clear liquid diet per surgery for now   2.  Atrial fibrillation hold Eliquis continue all other medications Start heparin will consult pharmacy, can stop heparin 6 hours prior to surgery if needed  3.  BPH continue  Proscar and flomax  4. Anxiety continue Xanax  5.  Miscellaneous SCDs for DVT prophylaxis     Code Status Orders  (From admission, onward)         Start     Ordered   10/07/18 1524  Full code  Continuous     10/07/18 1523        Code Status History    This patient has a current code status but no historical code status.    Advance Directive Documentation     Most Recent Value  Type of Advance Directive  Living will, Healthcare Power of Attorney  Pre-existing out of facility DNR order (yellow form or pink MOST form)  -  "MOST" Form in Place?  -           Consults surgery gastroenterology   DVT Prophylaxis liquid Lab Results  Component Value Date   PLT 243 10/07/2018     Time Spent in minutes   35 minutes greater than 50% of time spent in care coordination and counseling patient regarding the condition and plan of care.   Dustin Flock M.D on 10/10/2018 at 2:13 PM  Between 7am to 6pm - Pager - [phone]  After 6pm go to [email or web address] - Proofreader  [employer]   Office  [phone]

## 2018-10-11 ENCOUNTER — Inpatient Hospital Stay: Payer: Medicare Other

## 2018-10-11 DIAGNOSIS — K5900 Constipation, unspecified: Secondary | ICD-10-CM

## 2018-10-11 DIAGNOSIS — K6389 Other specified diseases of intestine: Secondary | ICD-10-CM

## 2018-10-11 LAB — CBC
HCT: 39.8 % (ref 39.0–52.0)
Hemoglobin: 13.5 g/dL (ref 13.0–17.0)
MCH: 30.7 pg (ref 26.0–34.0)
MCHC: 33.9 g/dL (ref 30.0–36.0)
MCV: 90.5 fL (ref 80.0–100.0)
Platelets: 233 10*3/uL (ref 150–400)
RBC: 4.4 MIL/uL (ref 4.22–5.81)
RDW: 12.5 % (ref 11.5–15.5)
WBC: 3.6 10*3/uL — ABNORMAL LOW (ref 4.0–10.5)
nRBC: 0 % (ref 0.0–0.2)

## 2018-10-11 LAB — APTT: APTT: 156 s — AB (ref 24–36)

## 2018-10-11 LAB — HEPARIN LEVEL (UNFRACTIONATED)
HEPARIN UNFRACTIONATED: 1.19 [IU]/mL — AB (ref 0.30–0.70)
Heparin Unfractionated: 1.7 IU/mL — ABNORMAL HIGH (ref 0.30–0.70)

## 2018-10-11 MED ORDER — HEPARIN (PORCINE) 25000 UT/250ML-% IV SOLN
550.0000 [IU]/h | INTRAVENOUS | Status: DC
  Administered 2018-10-11 (×2): 800 [IU]/h via INTRAVENOUS
  Filled 2018-10-11: qty 250

## 2018-10-11 NOTE — Progress Notes (Signed)
This is a patient being followed by Dr. Tama High.  He was seen on rounds today.  The patient was admitted with a sigmoid volvulus that was treated with endoscopic decompression.  There is some question about whether or not he has persistent volvulus, but there is no evidence for ischemia.  He denies abdominal pain this morning.  Has tolerated his clear liquids without difficulty.  He is being given a bowel regimen due to severe constipation.  He reports some response to this, having several bowel movements over the past 24 hours.  Of note, there are no outputs recorded in this patient's ins and outs.  Past Medical History:  Diagnosis Date  . Acute diverticulitis 12/15   [facility]  . Acute diverticulitis   . Allergic rhinitis   . Atrial fibrillation or flutter 12/10   Has been paroxysmal. Patient has not tolerated coumadin due to headaches. ECHO (12/10) Mild LVH, EF 55%,   . BPH (benign prostatic hyperplasia)   . Chest pain 12/10   Lexiscan myoview with EF 55%, no ischemia or infarction  . Dysrhythmia    AFIB  . HOH (hard of hearing)   . Hyperlipemia   . Hypertension   . Osteoarthritis   . Pacemaker  medtronic 02/06/2010   Qualifier: Diagnosis of  By: Lovena Le, MD, [facility], Binnie Kand   . Presence of permanent cardiac pacemaker   . Sick sinus syndrome (HCC)    S/P dual chamber Medtronic PCM   Past Surgical History:  Procedure Laterality Date  . CATARACT EXTRACTION W/PHACO Left 01/16/2017   Procedure: CATARACT EXTRACTION PHACO AND INTRAOCULAR LENS PLACEMENT (IOC);  Surgeon: Estill Cotta, MD;  Location: [facility] ORS;  Service: Ophthalmology;  Laterality: Left;  [place] 1:51.3 AP% 25.9 CDE 54.80 FLUID PACK LOT # [id number]  . COLONOSCOPY WITH PROPOFOL N/A 09/15/2018   Procedure: COLONOSCOPY WITH PROPOFOL;  Surgeon: Lin Landsman, MD;  Location: [facility] ENDOSCOPY;  Service: Gastroenterology;  Laterality: N/A;  . dual chamber pacemaker  01/11   Dr. Lovena Le  . FLEXIBLE SIGMOIDOSCOPY N/A 10/07/2018    Procedure: FLEXIBLE SIGMOIDOSCOPY;  Surgeon: Virgel Manifold, MD;  Location: [facility] ENDOSCOPY;  Service: Endoscopy;  Laterality: N/A;  . LAPAROSCOPIC CHOLECYSTECTOMY  1/14   Dr Bary Castilla  . VASECTOMY  in his 34's   Family History  Problem Relation Age of Onset  . Hypertension Mother   . Alcohol abuse Father   . Bladder Cancer Neg Hx   . Prostate cancer Neg Hx   . Kidney cancer Neg Hx    Social History   Tobacco Use  . Smoking status: Former Research scientist (life sciences)  . Smokeless tobacco: Never Used  . Tobacco comment: In [place], quit in 1960's  Substance Use Topics  . Alcohol use: No  . Drug use: No    Current Facility-Administered Medications:  .  acetaminophen (TYLENOL) tablet 650 mg, 650 mg, Oral, Q6H PRN, 650 mg at 10/09/18 0810 **OR** acetaminophen (TYLENOL) suppository 650 mg, 650 mg, Rectal, Q6H PRN, Dustin Flock, MD .  ALPRAZolam Duanne Moron) tablet 0.25 mg, 0.25 mg, Oral, BID PRN, Dustin Flock, MD .  diltiazem (CARDIZEM CD) 24 hr capsule 180 mg, 180 mg, Oral, Q2000, Dustin Flock, MD, 180 mg at 10/10/18 2024 .  docusate sodium (COLACE) capsule 100 mg, 100 mg, Oral, BID, Tylene Fantasia, PA-C, 100 mg at 10/11/18 0950 .  finasteride (PROSCAR) tablet 5 mg, 5 mg, Oral, Daily, Dustin Flock, MD, 5 mg at 10/11/18 0950 .  flecainide (TAMBOCOR) tablet 100 mg, 100 mg, Oral,  BID, Dustin Flock, MD, 100 mg at 10/11/18 0951 .  heparin ADULT infusion 100 units/mL (25000 units/212mL sodium chloride 0.45%), 800 Units/hr, Intravenous, Continuous, Dustin Flock, MD, Last Rate: 8 mL/hr at 10/11/18 1123, 800 Units/hr at 10/11/18 1123 .  HYDROcodone-acetaminophen (NORCO/VICODIN) 5-325 MG per tablet 1 tablet, 1 tablet, Oral, Q6H PRN, Fredirick Maudlin, MD, 1 tablet at 10/10/18 1803 .  ondansetron (ZOFRAN) tablet 4 mg, 4 mg, Oral, Q6H PRN **OR** ondansetron (ZOFRAN) injection 4 mg, 4 mg, Intravenous, Q6H PRN, Dustin Flock, MD .  polyethylene glycol (MIRALAX / GLYCOLAX) packet 17 g, 17 g, Oral,  Daily, Tylene Fantasia, PA-C, 17 g at 10/11/18 0951 .  sodium phosphate (FLEET) 7-19 GM/118ML enema 1 enema, 1 enema, Rectal, Once, Arta Silence, MD .  tamsulosin (FLOMAX) capsule 0.4 mg, 0.4 mg, Oral, Q2000, Dustin Flock, MD, 0.4 mg at 10/10/18 2023   Allergies  Allergen Reactions  . Morphine And Related     Shaking, back pain.   Vitals:   10/11/18 0521 10/11/18 1219  BP:  (!) 170/80  Pulse:  60  Resp:  16  Temp: 98.1 F (36.7 C) 97.9 F (36.6 C)  SpO2:  97%   I/O last 3 completed shifts: In: 441.3 [P.O.:240; I.V.:201.3] Out: -  Total I/O In: 22.1 [I.V.:22.1] Out: -    Physical Exam  Constitutional: He is oriented to person, place, and time. He appears well-developed and well-nourished. No distress.  HENT:  Head: Normocephalic and atraumatic.  Mouth/Throat: No oropharyngeal exudate.  Eyes: Pupils are equal, round, and reactive to light. No scleral icterus.  Neck: Normal range of motion.  Cardiovascular: Normal rate.  irreg irreg  Pulmonary/Chest: Effort normal.  Abdominal: Soft. He exhibits no distension. There is no abdominal tenderness. There is no rebound and no guarding.  Musculoskeletal:        General: No edema.  Neurological: He is alert and oriented to person, place, and time.  Skin: Skin is warm.  Psychiatric: He has a normal mood and affect.    Results for AABAN, GRIEP (MRN [id number]) as of 10/11/2018 13:09  Ref. Range 10/10/2018 10:55 10/10/2018 14:28 10/10/2018 22:37 10/11/2018 05:01  Sodium Latest Ref Range: 135 - 145 mmol/L 138     Potassium Latest Ref Range: 3.5 - 5.1 mmol/L 3.6     Chloride Latest Ref Range: 98 - 111 mmol/L 103     CO2 Latest Ref Range: 22 - 32 mmol/L 25     Glucose Latest Ref Range: 70 - 99 mg/dL 99     BUN Latest Ref Range: 8 - 23 mg/dL 11     Creatinine Latest Ref Range: 0.61 - 1.24 mg/dL 1.13     Calcium Latest Ref Range: 8.9 - 10.3 mg/dL 9.1     Anion gap Latest Ref Range: 5 - 15  10     GFR, Est Non African American  Latest Ref Range: >60 mL/min 60 (L)     GFR, Est African American Latest Ref Range: >60 mL/min >60     WBC Latest Ref Range: 4.0 - 10.5 K/uL  4.8  3.6 (L)  RBC Latest Ref Range: 4.22 - 5.81 MIL/uL  4.17 (L)  4.40  Hemoglobin Latest Ref Range: 13.0 - 17.0 g/dL  13.1  13.5  HCT Latest Ref Range: 39.0 - 52.0 %  38.4 (L)  39.8  MCV Latest Ref Range: 80.0 - 100.0 fL  92.1  90.5  MCH Latest Ref Range: 26.0 - 34.0 pg  31.4  30.7  MCHC Latest Ref Range: 30.0 - 36.0 g/dL  34.1  33.9  RDW Latest Ref Range: 11.5 - 15.5 %  12.5  12.5  Platelets Latest Ref Range: 150 - 400 K/uL  225  233  nRBC Latest Ref Range: 0.0 - 0.2 %  0.0  0.0   CLINICAL DATA:  Abdominal pain. Sigmoid volvulus.  EXAM: ABDOMEN - 1 VIEW  COMPARISON:  Radiography 10/08/2018. CT 10/09/2018.  FINDINGS: Persistent sigmoid volvulus pattern without evidence of increasing dilatation or visible free air. Gas elsewhere throughout small and large bowel appears similar. No radiographic worsening.  IMPRESSION: Persistent sigmoid volvulus pattern without evidence of increasing dilatation or visible free air.  A/P: 85 year old male admitted after endoscopic decompression of sigmoid volvulus.  Despite radiographic imaging, he does not have a clinical exam consistent with assistant torsion.  He certainly does not have any signs of intestinal ischemia.  He does have chronic constipation.  He will continue the oral bowel regimen recommended by Dr. Rosana Hoes.  I have advanced his diet to full liquids.  He may be considered for elective sigmoid colectomy at some point in the future, however there is no emergent need for surgical intervention at this time.

## 2018-10-11 NOTE — Consult Note (Signed)
ANTICOAGULATION CONSULT NOTE - Initial Consult  Pharmacy Consult for Heparin infusion Indication: atrial fibrillation  Allergies  Allergen Reactions  . Morphine And Related     Shaking, back pain.    Patient Measurements: Height: 5\' 11"  (180.3 cm) Weight: 172 lb (78 kg) IBW/kg (Calculated) : 75.3 Heparin Dosing Weight: 78  Vital Signs: Temp: 98.1 F (36.7 C) (01/04 0521) Temp Source: Oral (01/04 0521) BP: 158/77 (01/04 0520) Pulse Rate: 61 (01/04 0520)  Labs: Recent Labs    10/09/18 0502 10/10/18 1055 10/10/18 1428 10/10/18 2237 10/11/18 0501  HGB  --   --  13.1  --  13.5  HCT  --   --  38.4*  --  39.8  PLT  --   --  225  --  233  APTT  --   --  34  --  156*  LABPROT  --   --  15.2  --   --   INR  --   --  1.21  --   --   HEPARINUNFRC  --   --   --  1.70*  --   CREATININE 0.98 1.13  --   --   --     Estimated Creatinine Clearance: 52.8 mL/min (by C-G formula based on SCr of 1.13 mg/dL).   Medical History: Past Medical History:  Diagnosis Date  . Acute diverticulitis 12/15   [facility]  . Acute diverticulitis   . Allergic rhinitis   . Atrial fibrillation or flutter 12/10   Has been paroxysmal. Patient has not tolerated coumadin due to headaches. ECHO (12/10) Mild LVH, EF 55%,   . BPH (benign prostatic hyperplasia)   . Chest pain 12/10   Lexiscan myoview with EF 55%, no ischemia or infarction  . Dysrhythmia    AFIB  . HOH (hard of hearing)   . Hyperlipemia   . Hypertension   . Osteoarthritis   . Pacemaker  medtronic 02/06/2010   Qualifier: Diagnosis of  By: Lovena Le, MD, [facility], Binnie Kand   . Presence of permanent cardiac pacemaker   . Sick sinus syndrome (HCC)    S/P dual chamber Medtronic PCM    Medications:  Medications Prior to Admission  Medication Sig Dispense Refill Last Dose  . ALPRAZolam (XANAX) 0.25 MG tablet TAKE 1 TABLET BY MOUTH TWICE DAILY AS NEEDED FOR ANXIETY 60 tablet 0 Past Month at Unknown time  . apixaban (ELIQUIS) 2.5 MG TABS tablet  Take 2.5 mg by mouth 2 (two) times daily.   10/06/2018 at 2000  . ciprofloxacin (CIPRO) 250 MG tablet Take 1 tablet (250 mg total) by mouth 2 (two) times daily. 14 tablet 0 10/06/2018 at 2000  . diltiazem (TIAZAC) 180 MG 24 hr capsule Take 180 mg by mouth daily.   10/06/2018 at 2000  . finasteride (PROSCAR) 5 MG tablet Take 1 tablet (5 mg total) by mouth daily. 90 tablet 3 10/06/2018 at 0800  . flecainide (TAMBOCOR) 100 MG tablet Take 1 tablet (100 mg total) by mouth 2 (two) times daily. 180 tablet 3 10/06/2018 at 200  . Glucosamine 500 MG CAPS Take by mouth daily.     10/06/2018 at 0800  . ibuprofen (ADVIL,MOTRIN) 200 MG tablet Take 200 mg by mouth every 6 (six) hours as needed for moderate pain.   10/07/2018 at 0400  . metroNIDAZOLE (FLAGYL) 500 MG tablet Take 1 tablet (500 mg total) by mouth 3 (three) times daily. 21 tablet 0 10/06/2018 at 2000  . tamsulosin (FLOMAX) 0.4 MG CAPS  capsule TAKE ONE CAPSULE BY MOUTH EVERY DAY 30 capsule 11 10/06/2018 at 2000  . acetaminophen (TYLENOL) 325 MG tablet Take 650 mg by mouth every 6 (six) hours as needed. Reported on 01/17/2016   Not Taking at Unknown time    Assessment: 85 y.o. male with a known history of atrial fibrillation, BPH, hyperlipidemia, hypertension, osteoarthritis who is presenting with constipation for 9 days as well as abdominal pain.  Patient had endoscopic decompression.  Now with possible need for sigmoid colectomy.  Due to this potential need patient had Apixiban held since 1/1 @ 0930 being the last dose.  Patient at present time not wanting to proceed with procedure but understanding that if he clinically deteriorates will need to have this completed.  Therefore the patient will be started on a heparin infusion.  Heparin infusing at 1100 units/hr 1/3 22:37 HL 1.70 (delay in results until ~02:40), felt this could be effect of apixaban so ordered a STAT aPTT. 1/4 05:00 aPTT 156  Goal of Therapy:  Heparin level 0.3-0.7 units/ml Monitor  platelets by anticoagulation protocol: Yes   Plan:  Will hold Heparin drip for 1 hour then resume at decreased rate of 800 units/hr. Will recheck HL in 8 hours. Daily CBC while on Heparin infusion.  Paulina Fusi, PharmD, BCPS 10/11/2018 6:14 AM

## 2018-10-11 NOTE — Progress Notes (Signed)
[place] [facility]                                                                                                                                                                                  Patient Demographics   [name], is a 85 y.o. male, DOB - [birth year], EHU:[id number]  Admit date - 10/07/2018   Admitting Physician Dustin Flock, MD  Outpatient Primary MD for the patient is [name], MD   LOS - 4  Subjective: States he is feeling pretty good this morning.  He endorses only very mild abdominal pain. He is tolerating a clear liquid diet.  He states he had several small watery bowel movements this morning.  Review of Systems:   CONSTITUTIONAL: No documented fever. No fatigue, weakness. No weight gain, no weight loss.  EYES: No blurry or double vision.  ENT: No tinnitus. No postnasal drip. No redness of the oropharynx.  RESPIRATORY: No cough, no wheeze, no hemoptysis. No dyspnea.  CARDIOVASCULAR: No chest pain. No orthopnea. No palpitations. No syncope.  GASTROINTESTINAL: No nausea, no vomiting or diarrhea. +abdominal pain. No melena or hematochezia.  GENITOURINARY: No dysuria or hematuria.  ENDOCRINE: No polyuria or nocturia. No heat or cold intolerance.  HEMATOLOGY: No anemia. No bruising. No bleeding.  INTEGUMENTARY: No rashes. No lesions.  MUSCULOSKELETAL: No arthritis. No swelling. No gout.  NEUROLOGIC: No numbness, tingling, or ataxia. No seizure-type activity.  PSYCHIATRIC: No anxiety. No insomnia. No ADD.   Vitals:   Vitals:   10/11/18 0520 10/11/18 0521 10/11/18 1219 10/11/18 1316  BP: (!) 158/77  (!) 170/80 (!) 150/62  Pulse: 61  60   Resp: 16  16   Temp:  98.1 F (36.7 C) 97.9 F (36.6 C)   TempSrc:  Oral Oral   SpO2: 98%  97%   Weight:      Height:        Wt Readings from Last 3 Encounters:  10/07/18 78 kg  10/06/18 78.5 kg  09/15/18 77.1 kg     Intake/Output Summary (Last 24 hours) at 10/11/2018  1444 Last data filed at 10/11/2018 1351 Gross per 24 hour  Intake 643.36 ml  Output -  Net 643.36 ml    Physical Exam:   GENERAL: Well-appearing in no apparent distress.  HEENT: Atraumatic, normocephalic. Extraocular muscles are intact. Pupils equal and reactive to light. Sclerae anicteric. No conjunctival injection.  Moist mucous membranes. NECK: Supple. There is no jugular venous distention. No bruits, no lymphadenopathy, no thyromegaly.  HEART: Regular rate and rhythm,. No murmurs, no rubs, no clicks.  LUNGS: Clear to auscultation bilaterally. No rales or rhonchi. No wheezes.  ABDOMEN:  Soft, flat, nontender, nondistended. + Very mild generalized tenderness to palpation.  No rebound or guarding. EXTREMITIES: No evidence of any cyanosis, clubbing, or peripheral edema.  +2 pedal and radial pulses bilaterally.  NEUROLOGIC: The patient is alert, awake, and oriented x3 with no focal motor or sensory deficits appreciated bilaterally.  SKIN: Moist and warm with no rashes appreciated.  Psych: Not anxious, depressed   Antibiotics   Anti-infectives (From admission, onward)   None      Medications   Scheduled Meds: . diltiazem  180 mg Oral Q2000  . docusate sodium  100 mg Oral BID  . finasteride  5 mg Oral Daily  . flecainide  100 mg Oral BID  . polyethylene glycol  17 g Oral Daily  . sodium phosphate  1 enema Rectal Once  . tamsulosin  0.4 mg Oral Q2000   Continuous Infusions: . heparin 800 Units/hr (10/11/18 1123)   PRN Meds:.acetaminophen **OR** acetaminophen, ALPRAZolam, HYDROcodone-acetaminophen, ondansetron **OR** ondansetron (ZOFRAN) IV   Data Review:   Micro Results No results found for this or any previous visit (from the past 240 hour(s)).  Radiology Reports Dg Abd 1 View  Result Date: 10/11/2018 CLINICAL DATA:  Abdominal pain. Sigmoid volvulus. EXAM: ABDOMEN - 1 VIEW COMPARISON:  Radiography 10/08/2018. CT 10/09/2018. FINDINGS: Persistent sigmoid volvulus pattern  without evidence of increasing dilatation or visible free air. Gas elsewhere throughout small and large bowel appears similar. No radiographic worsening. IMPRESSION: Persistent sigmoid volvulus pattern without evidence of increasing dilatation or visible free air. Electronically Signed   By: Nelson Chimes M.D.   On: 10/11/2018 07:17   Dg Abd 1 View  Result Date: 10/08/2018 CLINICAL DATA:  Colonic distention. EXAM: ABDOMEN - 1 VIEW COMPARISON:  Radiographs and CT scan of October 07, 2018. FINDINGS: Status post cholecystectomy. Dilated loops of colon are seen on the right side which is slightly improved compared to prior exam. Stool is noted in the more distal colon. No definite small bowel dilatation is noted. IMPRESSION: Slightly improved dilatation of large bowel loops in right side of abdomen compared to prior exam. Electronically Signed   By: Marijo Conception, M.D.   On: 10/08/2018 11:55   Ct Abdomen Pelvis W Contrast  Result Date: 10/09/2018 CLINICAL DATA:  Abdominal pain and constipation.  Sigmoid volvulus. EXAM: CT ABDOMEN AND PELVIS WITH CONTRAST TECHNIQUE: Multidetector CT imaging of the abdomen and pelvis was performed using the standard protocol following bolus administration of intravenous contrast. CONTRAST:  124mL OMNIPAQUE IOHEXOL 300 MG/ML  SOLN COMPARISON:  CT scan 10/07/2018 FINDINGS: Lower chest: Streaky bibasilar atelectasis but no infiltrates or effusions. The heart is normal in size for age. No pericardial effusion. Pacer wires are noted. Hepatobiliary: Stable hepatic cysts. No worrisome hepatic lesions. The gallbladder is surgically absent. No common bile duct dilatation. Pancreas: No mass, inflammation or ductal dilatation. Spleen: Normal size.  No focal lesions. Adrenals/Urinary Tract: The adrenal glands and kidneys are normal in stable. The bladder is unremarkable. Stomach/Bowel: The stomach and duodenum are unremarkable. The small bowel appears normal. No acute inflammatory changes,  mass lesions or obstructive findings. Large amount of stool involving the ascending, transverse and proximal descending colon. Persistent changes of a sigmoid volvulus with swirling of the mesenteric vessels and the sigmoid colon which is decompressed below the volvulus. Moderate distention of the sigmoid colon above the volvulus which is very tortuous and redundant. No findings for bowel ischemia.  No pneumatosis or free air. Vascular/Lymphatic: Moderate distal aortic and iliac artery calcifications.  No aneurysm or dissection. The aortic branch vessels are patent. The major venous structures are patent. No mesenteric or retroperitoneal mass or adenopathy. Reproductive: Markedly enlarged prostate gland. Other: Small amount of free pelvic fluid. Musculoskeletal: No significant bony findings. Left convex lumbar scoliosis with associated degenerative lumbar spondylosis and multilevel disc disease and facet disease. Stable large hemangioma occupying the L1 vertebral body is along with other smaller hemangiomas. IMPRESSION: 1. Persistent changes of a sigmoid volvulus. No pneumatosis or free air. 2. Stable numerous hepatic cysts. 3. Status post cholecystectomy but no biliary dilatation. Electronically Signed   By: Marijo Sanes M.D.   On: 10/09/2018 09:23   Ct Abdomen Pelvis W Contrast  Result Date: 10/07/2018 CLINICAL DATA:  Acute generalized abdominal pain. EXAM: CT ABDOMEN AND PELVIS WITH CONTRAST TECHNIQUE: Multidetector CT imaging of the abdomen and pelvis was performed using the standard protocol following bolus administration of intravenous contrast. CONTRAST:  11mL ISOVUE-300 IOPAMIDOL (ISOVUE-300) INJECTION 61% COMPARISON:  CT scan of June 22, 2017. FINDINGS: Lower chest: No acute abnormality. Hepatobiliary: Status post cholecystectomy. No biliary dilatation is noted. Stable appendix cysts are noted. Pancreas: Unremarkable. No pancreatic ductal dilatation or surrounding inflammatory changes. Spleen:  Normal in size without focal abnormality. Adrenals/Urinary Tract: Adrenal glands are unremarkable. Kidneys are normal, without renal calculi, focal lesion, or hydronephrosis. Bladder is unremarkable. Stomach/Bowel: The stomach appears normal. The appendix is not visualized. No small bowel dilatation is noted. However, there appears to be a volvulus involving the sigmoid colon with dilatation of the more proximal colon loops. Vascular/Lymphatic: Aortic atherosclerosis. No enlarged abdominal or pelvic lymph nodes. Reproductive: Stable moderate prostatic enlargement is noted. Other: Stable right inguinal hernia is noted which contains a loop of small bowel, but does not result in incarceration or obstruction. Musculoskeletal: Severe multilevel degenerative disc disease is noted in the lumbar spine. No acute osseous abnormality is noted. IMPRESSION: There appears to be twisting of the sigmoid colon concerning for volvulus resulting in mild dilatation obstruction of the more proximal colon. Sigmoidoscopy is recommended for further evaluation. Critical Value/emergent results were called by telephone at the time of interpretation on 10/07/2018 at 10:58 am to Dr. Arta Silence , who verbally acknowledged these results. Stable moderate prostatic enlargement. Aortic Atherosclerosis (ICD10-I70.0). Electronically Signed   By: Marijo Conception, M.D.   On: 10/07/2018 10:58   Dg Abd 2 Views  Result Date: 10/07/2018 CLINICAL DATA:  No bowel movement in 9 days according to the patient, abdominal pain and bloating, history of diverticulitis EXAM: ABDOMEN - 2 VIEW COMPARISON:  CT abdomen pelvis of 06/22/2017 FINDINGS: Both large and small bowel gas is present without significant distension. Slight gaseous distention of the right colon is noted of questionable significance. There is no evidence of bowel obstruction currently. Permanent pacemaker wires remain. No opaque calculus is seen. There are degenerative changes in the  lumbar spine. Surgical clips are present in the right upper quadrant from prior cholecystectomy. IMPRESSION: 1. Slight gaseous distention of the right colon, but no definite evidence of bowel obstruction is noted. 2. Diffuse degenerative change throughout the lumbar spine. Electronically Signed   By: Ivar Drape M.D.   On: 10/07/2018 09:03     CBC Recent Labs  Lab 10/07/18 0946 10/10/18 1428 10/11/18 0501  WBC 5.3 4.8 3.6*  HGB 14.2 13.1 13.5  HCT 41.8 38.4* 39.8  PLT 243 225 233  MCV 91.1 92.1 90.5  MCH 30.9 31.4 30.7  MCHC 34.0 34.1 33.9  RDW 12.6 12.5 12.5  LYMPHSABS  1.6  --   --   MONOABS 0.5  --   --   EOSABS 0.0  --   --   BASOSABS 0.0  --   --     Chemistries  Recent Labs  Lab 10/07/18 0946 10/09/18 0502 10/10/18 1055  NA 139 138 138  K 3.7 3.3* 3.6  CL 107 108 103  CO2 23 25 25   GLUCOSE 103* 91 99  BUN 20 14 11   CREATININE 1.08 0.98 1.13  CALCIUM 9.2 8.5* 9.1  AST 17  --   --   ALT 11  --   --   ALKPHOS 57  --   --   BILITOT 1.1  --   --    ------------------------------------------------------------------------------------------------------------------ estimated creatinine clearance is 52.8 mL/min (by C-G formula based on SCr of 1.13 mg/dL). ------------------------------------------------------------------------------------------------------------------ No results for input(s): HGBA1C in the last 72 hours. ------------------------------------------------------------------------------------------------------------------ No results for input(s): CHOL, HDL, LDLCALC, TRIG, CHOLHDL, LDLDIRECT in the last 72 hours. ------------------------------------------------------------------------------------------------------------------ No results for input(s): TSH, T4TOTAL, T3FREE, THYROIDAB in the last 72 hours.  Invalid input(s): FREET3 ------------------------------------------------------------------------------------------------------------------ No results for  input(s): VITAMINB12, FOLATE, FERRITIN, TIBC, IRON, RETICCTPCT in the last 72 hours.  Coagulation profile Recent Labs  Lab 10/10/18 1428  INR 1.21    No results for input(s): DDIMER in the last 72 hours.  Cardiac Enzymes No results for input(s): CKMB, TROPONINI, MYOGLOBIN in the last 168 hours.  Invalid input(s): CK ------------------------------------------------------------------------------------------------------------------ Invalid input(s): POCBNP    Assessment & Plan   Persistent sigmoid volvulus- s/p endoscopic decompression by GI. Status post reduction by gastroenterology no improvement -Surgery following -Advance diet to full liquids today per surgery -Continue bowel regimen  Paroxysmal atrial fibrillation-in NSR today -Continue heparin drip -Hold Eliquis due to potential for surgery, will restart on discharge  BPH-stable.  -Continue  Proscar and flomax  Anxiety-stable -Continue Xanax  DVT prophylaxis- on heparin drip, SCDs     Code Status Orders  (From admission, onward)         Start     Ordered   10/07/18 1524  Full code  Continuous     10/07/18 1523        Code Status History    This patient has a current code status but no historical code status.    Advance Directive Documentation     Most Recent Value  Type of Advance Directive  Living will, Healthcare Power of Attorney  Pre-existing out of facility DNR order (yellow form or pink MOST form)  -  "MOST" Form in Place?  -     Consults surgery, gastroenterology  DVT Prophylaxis heparin gtt, SCDs Lab Results  Component Value Date   PLT 233 10/11/2018   Time Spent in minutes   35 minutes. Greater than 50% of time spent in care coordination and counseling patient regarding the condition and plan of care.  Berna Spare Ashly Goethe M.D on 10/11/2018 at 2:44 PM  Between 7am to 6pm - Pager - [phone]  After 6pm go to [email or web address] - Proofreader  [employer]   Office   [phone]

## 2018-10-11 NOTE — Progress Notes (Signed)
Pharmacy notified this RN to hold heparin for 1hr and new orders for new rate.

## 2018-10-11 NOTE — Consult Note (Signed)
ANTICOAGULATION CONSULT NOTE - Initial Consult  Pharmacy Consult for Heparin infusion Indication: atrial fibrillation  Allergies  Allergen Reactions  . Morphine And Related     Shaking, back pain.    Patient Measurements: Height: 5\' 11"  (180.3 cm) Weight: 172 lb (78 kg) IBW/kg (Calculated) : 75.3 Heparin Dosing Weight: 78  Vital Signs: Temp: 97.9 F (36.6 C) (01/04 1219) Temp Source: Oral (01/04 1219) BP: 150/62 (01/04 1316) Pulse Rate: 60 (01/04 1219)  Labs: Recent Labs    10/09/18 0502 10/10/18 1055 10/10/18 1428 10/10/18 2237 10/11/18 0501 10/11/18 1450  HGB  --   --  13.1  --  13.5  --   HCT  --   --  38.4*  --  39.8  --   PLT  --   --  225  --  233  --   APTT  --   --  34  --  156*  --   LABPROT  --   --  15.2  --   --   --   INR  --   --  1.21  --   --   --   HEPARINUNFRC  --   --   --  1.70*  --  1.19*  CREATININE 0.98 1.13  --   --   --   --     Estimated Creatinine Clearance: 52.8 mL/min (by C-G formula based on SCr of 1.13 mg/dL).   Medical History: Past Medical History:  Diagnosis Date  . Acute diverticulitis 12/15   [facility]  . Acute diverticulitis   . Allergic rhinitis   . Atrial fibrillation or flutter 12/10   Has been paroxysmal. Patient has not tolerated coumadin due to headaches. ECHO (12/10) Mild LVH, EF 55%,   . BPH (benign prostatic hyperplasia)   . Chest pain 12/10   Lexiscan myoview with EF 55%, no ischemia or infarction  . Dysrhythmia    AFIB  . HOH (hard of hearing)   . Hyperlipemia   . Hypertension   . Osteoarthritis   . Pacemaker  medtronic 02/06/2010   Qualifier: Diagnosis of  By: Lovena Le, MD, [facility], Binnie Kand   . Presence of permanent cardiac pacemaker   . Sick sinus syndrome (HCC)    S/P dual chamber Medtronic PCM    Medications:  Medications Prior to Admission  Medication Sig Dispense Refill Last Dose  . ALPRAZolam (XANAX) 0.25 MG tablet TAKE 1 TABLET BY MOUTH TWICE DAILY AS NEEDED FOR ANXIETY 60 tablet 0 Past Month  at Unknown time  . apixaban (ELIQUIS) 2.5 MG TABS tablet Take 2.5 mg by mouth 2 (two) times daily.   10/06/2018 at 2000  . ciprofloxacin (CIPRO) 250 MG tablet Take 1 tablet (250 mg total) by mouth 2 (two) times daily. 14 tablet 0 10/06/2018 at 2000  . diltiazem (TIAZAC) 180 MG 24 hr capsule Take 180 mg by mouth daily.   10/06/2018 at 2000  . finasteride (PROSCAR) 5 MG tablet Take 1 tablet (5 mg total) by mouth daily. 90 tablet 3 10/06/2018 at 0800  . flecainide (TAMBOCOR) 100 MG tablet Take 1 tablet (100 mg total) by mouth 2 (two) times daily. 180 tablet 3 10/06/2018 at 200  . Glucosamine 500 MG CAPS Take by mouth daily.     10/06/2018 at 0800  . ibuprofen (ADVIL,MOTRIN) 200 MG tablet Take 200 mg by mouth every 6 (six) hours as needed for moderate pain.   10/07/2018 at 0400  . metroNIDAZOLE (FLAGYL) 500 MG tablet Take  1 tablet (500 mg total) by mouth 3 (three) times daily. 21 tablet 0 10/06/2018 at 2000  . tamsulosin (FLOMAX) 0.4 MG CAPS capsule TAKE ONE CAPSULE BY MOUTH EVERY DAY 30 capsule 11 10/06/2018 at 2000  . acetaminophen (TYLENOL) 325 MG tablet Take 650 mg by mouth every 6 (six) hours as needed. Reported on 01/17/2016   Not Taking at Unknown time    Assessment: 85 y.o. male with a known history of atrial fibrillation, BPH, hyperlipidemia, hypertension, osteoarthritis who is presenting with constipation for 9 days as well as abdominal pain.  Patient had endoscopic decompression.  Now with possible need for sigmoid colectomy.  Due to this potential need patient had Apixiban held since 1/1 @ 0930 being the last dose.  Patient at present time not wanting to proceed with procedure but understanding that if he clinically deteriorates will need to have this completed.  Therefore the patient will be started on a heparin infusion.  Heparin was infusing at 1100 units/hr  1/3 22:37 HL 1.70   (delay in results until ~02:40), felt this could be effect of apixaban so ordered a STAT aPTT.  1/4 05:00  aPTT 156   1/4 1450 HL 1.19 (800 units/hour)   Goal of Therapy:  Heparin level 0.3-0.7 units/ml Monitor platelets by anticoagulation protocol: Yes   Plan:  Will hold Heparin drip for 1 hour then resume at decreased rate of 700 units/hr.   Will recheck HL in 8 hours. Daily CBC while on Heparin infusion.  [name], PharmD, BCPS Clinical Pharmacist 10/11/2018 7:55 PM

## 2018-10-12 LAB — BASIC METABOLIC PANEL
Anion gap: 8 (ref 5–15)
BUN: 13 mg/dL (ref 8–23)
CHLORIDE: 104 mmol/L (ref 98–111)
CO2: 26 mmol/L (ref 22–32)
Calcium: 8.9 mg/dL (ref 8.9–10.3)
Creatinine, Ser: 1.12 mg/dL (ref 0.61–1.24)
GFR calc non Af Amer: >60 mL/min (ref >60)
Glucose, Bld: 92 mg/dL (ref 70–99)
Potassium: 3.2 mmol/L — ABNORMAL LOW (ref 3.5–5.1)
Sodium: 138 mmol/L (ref 135–145)

## 2018-10-12 LAB — CBC
HCT: 38.4 % — ABNORMAL LOW (ref 39.0–52.0)
Hemoglobin: 12.8 g/dL — ABNORMAL LOW (ref 13.0–17.0)
MCH: 30.6 pg (ref 26.0–34.0)
MCHC: 33.3 g/dL (ref 30.0–36.0)
MCV: 91.9 fL (ref 80.0–100.0)
Platelets: 215 10*3/uL (ref 150–400)
RBC: 4.18 MIL/uL — ABNORMAL LOW (ref 4.22–5.81)
RDW: 12.5 % (ref 11.5–15.5)
WBC: 4 10*3/uL (ref 4.0–10.5)
nRBC: 0 % (ref 0.0–0.2)

## 2018-10-12 LAB — HEPARIN LEVEL (UNFRACTIONATED)
Heparin Unfractionated: 1.14 IU/mL — ABNORMAL HIGH (ref 0.30–0.70)
Heparin Unfractionated: 1.13 IU/mL — ABNORMAL HIGH (ref 0.30–0.70)

## 2018-10-12 LAB — MAGNESIUM: Magnesium: 2.3 mg/dL (ref 1.7–2.4)

## 2018-10-12 MED ORDER — POTASSIUM CHLORIDE CRYS ER 20 MEQ PO TBCR
40.0000 meq | EXTENDED_RELEASE_TABLET | Freq: Four times a day (QID) | ORAL | Status: AC
  Administered 2018-10-12 (×2): 40 meq via ORAL
  Filled 2018-10-12 (×2): qty 2

## 2018-10-12 MED ORDER — HEPARIN (PORCINE) 25000 UT/250ML-% IV SOLN
350.0000 [IU]/h | INTRAVENOUS | Status: DC
  Administered 2018-10-13: 350 [IU]/h via INTRAVENOUS
  Filled 2018-10-12: qty 250

## 2018-10-12 NOTE — Consult Note (Signed)
ANTICOAGULATION CONSULT NOTE - Initial Consult  Pharmacy Consult for Heparin infusion Indication: atrial fibrillation  Allergies  Allergen Reactions  . Morphine And Related     Shaking, back pain.    Patient Measurements: Height: 5\' 11"  (180.3 cm) Weight: 172 lb (78 kg) IBW/kg (Calculated) : 75.3 Heparin Dosing Weight: 78  Vital Signs: Temp: 97.7 F (36.5 C) (01/05 0450) Temp Source: Oral (01/05 0450) BP: 154/69 (01/05 0450) Pulse Rate: 60 (01/05 0450)  Labs: Recent Labs    10/10/18 1055  10/10/18 1428 10/10/18 2237 10/11/18 0501 10/11/18 1450 10/12/18 0414  HGB  --    < > 13.1  --  13.5  --  12.8*  HCT  --   --  38.4*  --  39.8  --  38.4*  PLT  --   --  225  --  233  --  215  APTT  --   --  34  --  156*  --   --   LABPROT  --   --  15.2  --   --   --   --   INR  --   --  1.21  --   --   --   --   HEPARINUNFRC  --   --   --  1.70*  --  1.19* 1.14*  CREATININE 1.13  --   --   --   --   --  1.12   < > = values in this interval not displayed.    Estimated Creatinine Clearance: 53.2 mL/min (by C-G formula based on SCr of 1.12 mg/dL).   Medical History: Past Medical History:  Diagnosis Date  . Acute diverticulitis 12/15   [facility]  . Acute diverticulitis   . Allergic rhinitis   . Atrial fibrillation or flutter 12/10   Has been paroxysmal. Patient has not tolerated coumadin due to headaches. ECHO (12/10) Mild LVH, EF 55%,   . BPH (benign prostatic hyperplasia)   . Chest pain 12/10   Lexiscan myoview with EF 55%, no ischemia or infarction  . Dysrhythmia    AFIB  . HOH (hard of hearing)   . Hyperlipemia   . Hypertension   . Osteoarthritis   . Pacemaker  medtronic 02/06/2010   Qualifier: Diagnosis of  By: Lovena Le, MD, [facility], Binnie Kand   . Presence of permanent cardiac pacemaker   . Sick sinus syndrome (HCC)    S/P dual chamber Medtronic PCM    Medications:  Medications Prior to Admission  Medication Sig Dispense Refill Last Dose  . ALPRAZolam (XANAX) 0.25  MG tablet TAKE 1 TABLET BY MOUTH TWICE DAILY AS NEEDED FOR ANXIETY 60 tablet 0 Past Month at Unknown time  . apixaban (ELIQUIS) 2.5 MG TABS tablet Take 2.5 mg by mouth 2 (two) times daily.   10/06/2018 at 2000  . ciprofloxacin (CIPRO) 250 MG tablet Take 1 tablet (250 mg total) by mouth 2 (two) times daily. 14 tablet 0 10/06/2018 at 2000  . diltiazem (TIAZAC) 180 MG 24 hr capsule Take 180 mg by mouth daily.   10/06/2018 at 2000  . finasteride (PROSCAR) 5 MG tablet Take 1 tablet (5 mg total) by mouth daily. 90 tablet 3 10/06/2018 at 0800  . flecainide (TAMBOCOR) 100 MG tablet Take 1 tablet (100 mg total) by mouth 2 (two) times daily. 180 tablet 3 10/06/2018 at 200  . Glucosamine 500 MG CAPS Take by mouth daily.     10/06/2018 at 0800  . ibuprofen (ADVIL,MOTRIN) 200  MG tablet Take 200 mg by mouth every 6 (six) hours as needed for moderate pain.   10/07/2018 at 0400  . metroNIDAZOLE (FLAGYL) 500 MG tablet Take 1 tablet (500 mg total) by mouth 3 (three) times daily. 21 tablet 0 10/06/2018 at 2000  . tamsulosin (FLOMAX) 0.4 MG CAPS capsule TAKE ONE CAPSULE BY MOUTH EVERY DAY 30 capsule 11 10/06/2018 at 2000  . acetaminophen (TYLENOL) 325 MG tablet Take 650 mg by mouth every 6 (six) hours as needed. Reported on 01/17/2016   Not Taking at Unknown time    Assessment: 85 y.o. male with a known history of atrial fibrillation, BPH, hyperlipidemia, hypertension, osteoarthritis who is presenting with constipation for 9 days as well as abdominal pain.  Patient had endoscopic decompression.  Now with possible need for sigmoid colectomy.  Due to this potential need patient had Apixiban held since 1/1 @ 0930 being the last dose.  Patient at present time not wanting to proceed with procedure but understanding that if he clinically deteriorates will need to have this completed.  Therefore the patient will be started on a heparin infusion.  Heparin was infusing at 1100 units/hr  1/3 22:37 HL 1.70   (delay in results  until ~02:40), felt this could be effect of apixaban so ordered a STAT aPTT.  1/4 05:00 aPTT 156   1/4 1450 HL 1.19 (800 units/hour)   Goal of Therapy:  Heparin level 0.3-0.7 units/ml Monitor platelets by anticoagulation protocol: Yes   Plan:  01/05 @ 0400 HL 1.19 supratherapeutic. Will hold drip for 1 hour and will restart @ 0700 at 550 units/hr per RN patient not bleeding, will recheck HL @ 1400, CBC trending down will continue to monitor.  Tobie Lords, PharmD, BCPS Clinical Pharmacist 10/12/2018 5:59 AM

## 2018-10-12 NOTE — Progress Notes (Signed)
[place] at [facility]                                                                                                                                                                                  Patient Demographics   [name], is a 85 y.o. male, DOB - May 15, 1933, BSW:[id number]  Admit date - 10/07/2018   Admitting Physician Dustin Flock, MD  Outpatient Primary MD for the patient is [name], MD   LOS - 5  Subjective: Continues to do well this morning.  He did have 1 bowel movement this morning, that was "more solid" per patient.  Continues passing flatus.  Tolerating full liquid diet without any issues.  Denies nausea or vomiting.  Review of Systems:   CONSTITUTIONAL: No documented fever. No fatigue, weakness. No weight gain, no weight loss.  EYES: No blurry or double vision.  ENT: No tinnitus. No postnasal drip. No redness of the oropharynx.  RESPIRATORY: No cough, no wheeze, no hemoptysis. No dyspnea.  CARDIOVASCULAR: No chest pain. No orthopnea. No palpitations. No syncope.  GASTROINTESTINAL: No nausea, no vomiting or diarrhea. +abdominal pain. No melena or hematochezia.  GENITOURINARY: No dysuria or hematuria.  ENDOCRINE: No polyuria or nocturia. No heat or cold intolerance.  HEMATOLOGY: No anemia. No bruising. No bleeding.  INTEGUMENTARY: No rashes. No lesions.  MUSCULOSKELETAL: No arthritis. No swelling. No gout.  NEUROLOGIC: No numbness, tingling, or ataxia. No seizure-type activity.  PSYCHIATRIC: No anxiety. No insomnia. No ADD.   Vitals:   Vitals:   10/11/18 1953 10/11/18 2327 10/12/18 0450 10/12/18 1235  BP: (!) 155/80 137/80 (!) 154/69 (!) 158/96  Pulse: 60 61 60 70  Resp: 16 20 16 16   Temp: (!) 97.3 F (36.3 C)  97.7 F (36.5 C) 98.2 F (36.8 C)  TempSrc: Oral  Oral Oral  SpO2: 95% 99% 98% 96%  Weight:      Height:        Wt Readings from Last 3 Encounters:  10/07/18 78 kg  10/06/18 78.5 kg  09/15/18 77.1 kg      Intake/Output Summary (Last 24 hours) at 10/12/2018 1320 Last data filed at 10/12/2018 1209 Gross per 24 hour  Intake 592.56 ml  Output 0 ml  Net 592.56 ml    Physical Exam:   GENERAL: Well-appearing in no apparent distress.  HEENT: Atraumatic, normocephalic. Extraocular muscles are intact. Pupils equal and reactive to light. Sclerae anicteric. No conjunctival injection.  Moist mucous membranes. NECK: Supple. There is no jugular venous distention. No bruits, no lymphadenopathy, no thyromegaly.  HEART: Regular rate and rhythm,. No murmurs, no rubs, no clicks.  LUNGS: Clear to auscultation bilaterally.  No rales or rhonchi. No wheezes.  ABDOMEN: Soft, flat, nontender, nondistended.  No rebound or guarding. EXTREMITIES: No evidence of any cyanosis, clubbing, or peripheral edema.  +2 pedal and radial pulses bilaterally.  NEUROLOGIC: The patient is alert, awake, and oriented x3 with no focal motor or sensory deficits appreciated bilaterally.  SKIN: Moist and warm with no rashes appreciated.  Psych: Not anxious, depressed   Antibiotics   Anti-infectives (From admission, onward)   None      Medications   Scheduled Meds: . diltiazem  180 mg Oral Q2000  . docusate sodium  100 mg Oral BID  . finasteride  5 mg Oral Daily  . flecainide  100 mg Oral BID  . polyethylene glycol  17 g Oral Daily  . potassium chloride  40 mEq Oral Q6H  . sodium phosphate  1 enema Rectal Once  . tamsulosin  0.4 mg Oral Q2000   Continuous Infusions: . heparin 550 Units/hr (10/12/18 1209)   PRN Meds:.acetaminophen **OR** acetaminophen, ALPRAZolam, HYDROcodone-acetaminophen, ondansetron **OR** ondansetron (ZOFRAN) IV   Data Review:   Micro Results No results found for this or any previous visit (from the past 240 hour(s)).  Radiology Reports Dg Abd 1 View  Result Date: 10/11/2018 CLINICAL DATA:  Abdominal pain. Sigmoid volvulus. EXAM: ABDOMEN - 1 VIEW COMPARISON:  Radiography 10/08/2018. CT  10/09/2018. FINDINGS: Persistent sigmoid volvulus pattern without evidence of increasing dilatation or visible free air. Gas elsewhere throughout small and large bowel appears similar. No radiographic worsening. IMPRESSION: Persistent sigmoid volvulus pattern without evidence of increasing dilatation or visible free air. Electronically Signed   By: Nelson Chimes M.D.   On: 10/11/2018 07:17   Dg Abd 1 View  Result Date: 10/08/2018 CLINICAL DATA:  Colonic distention. EXAM: ABDOMEN - 1 VIEW COMPARISON:  Radiographs and CT scan of October 07, 2018. FINDINGS: Status post cholecystectomy. Dilated loops of colon are seen on the right side which is slightly improved compared to prior exam. Stool is noted in the more distal colon. No definite small bowel dilatation is noted. IMPRESSION: Slightly improved dilatation of large bowel loops in right side of abdomen compared to prior exam. Electronically Signed   By: Marijo Conception, M.D.   On: 10/08/2018 11:55   Ct Abdomen Pelvis W Contrast  Result Date: 10/09/2018 CLINICAL DATA:  Abdominal pain and constipation.  Sigmoid volvulus. EXAM: CT ABDOMEN AND PELVIS WITH CONTRAST TECHNIQUE: Multidetector CT imaging of the abdomen and pelvis was performed using the standard protocol following bolus administration of intravenous contrast. CONTRAST:  120mL OMNIPAQUE IOHEXOL 300 MG/ML  SOLN COMPARISON:  CT scan 10/07/2018 FINDINGS: Lower chest: Streaky bibasilar atelectasis but no infiltrates or effusions. The heart is normal in size for age. No pericardial effusion. Pacer wires are noted. Hepatobiliary: Stable hepatic cysts. No worrisome hepatic lesions. The gallbladder is surgically absent. No common bile duct dilatation. Pancreas: No mass, inflammation or ductal dilatation. Spleen: Normal size.  No focal lesions. Adrenals/Urinary Tract: The adrenal glands and kidneys are normal in stable. The bladder is unremarkable. Stomach/Bowel: The stomach and duodenum are unremarkable. The  small bowel appears normal. No acute inflammatory changes, mass lesions or obstructive findings. Large amount of stool involving the ascending, transverse and proximal descending colon. Persistent changes of a sigmoid volvulus with swirling of the mesenteric vessels and the sigmoid colon which is decompressed below the volvulus. Moderate distention of the sigmoid colon above the volvulus which is very tortuous and redundant. No findings for bowel ischemia.  No pneumatosis or  free air. Vascular/Lymphatic: Moderate distal aortic and iliac artery calcifications. No aneurysm or dissection. The aortic branch vessels are patent. The major venous structures are patent. No mesenteric or retroperitoneal mass or adenopathy. Reproductive: Markedly enlarged prostate gland. Other: Small amount of free pelvic fluid. Musculoskeletal: No significant bony findings. Left convex lumbar scoliosis with associated degenerative lumbar spondylosis and multilevel disc disease and facet disease. Stable large hemangioma occupying the L1 vertebral body is along with other smaller hemangiomas. IMPRESSION: 1. Persistent changes of a sigmoid volvulus. No pneumatosis or free air. 2. Stable numerous hepatic cysts. 3. Status post cholecystectomy but no biliary dilatation. Electronically Signed   By: Marijo Sanes M.D.   On: 10/09/2018 09:23   Ct Abdomen Pelvis W Contrast  Result Date: 10/07/2018 CLINICAL DATA:  Acute generalized abdominal pain. EXAM: CT ABDOMEN AND PELVIS WITH CONTRAST TECHNIQUE: Multidetector CT imaging of the abdomen and pelvis was performed using the standard protocol following bolus administration of intravenous contrast. CONTRAST:  166mL ISOVUE-300 IOPAMIDOL (ISOVUE-300) INJECTION 61% COMPARISON:  CT scan of June 22, 2017. FINDINGS: Lower chest: No acute abnormality. Hepatobiliary: Status post cholecystectomy. No biliary dilatation is noted. Stable appendix cysts are noted. Pancreas: Unremarkable. No pancreatic ductal  dilatation or surrounding inflammatory changes. Spleen: Normal in size without focal abnormality. Adrenals/Urinary Tract: Adrenal glands are unremarkable. Kidneys are normal, without renal calculi, focal lesion, or hydronephrosis. Bladder is unremarkable. Stomach/Bowel: The stomach appears normal. The appendix is not visualized. No small bowel dilatation is noted. However, there appears to be a volvulus involving the sigmoid colon with dilatation of the more proximal colon loops. Vascular/Lymphatic: Aortic atherosclerosis. No enlarged abdominal or pelvic lymph nodes. Reproductive: Stable moderate prostatic enlargement is noted. Other: Stable right inguinal hernia is noted which contains a loop of small bowel, but does not result in incarceration or obstruction. Musculoskeletal: Severe multilevel degenerative disc disease is noted in the lumbar spine. No acute osseous abnormality is noted. IMPRESSION: There appears to be twisting of the sigmoid colon concerning for volvulus resulting in mild dilatation obstruction of the more proximal colon. Sigmoidoscopy is recommended for further evaluation. Critical Value/emergent results were called by telephone at the time of interpretation on 10/07/2018 at 10:58 am to Dr. Arta Silence , who verbally acknowledged these results. Stable moderate prostatic enlargement. Aortic Atherosclerosis (ICD10-I70.0). Electronically Signed   By: Marijo Conception, M.D.   On: 10/07/2018 10:58   Dg Abd 2 Views  Result Date: 10/07/2018 CLINICAL DATA:  No bowel movement in 9 days according to the patient, abdominal pain and bloating, history of diverticulitis EXAM: ABDOMEN - 2 VIEW COMPARISON:  CT abdomen pelvis of 06/22/2017 FINDINGS: Both large and small bowel gas is present without significant distension. Slight gaseous distention of the right colon is noted of questionable significance. There is no evidence of bowel obstruction currently. Permanent pacemaker wires remain. No opaque  calculus is seen. There are degenerative changes in the lumbar spine. Surgical clips are present in the right upper quadrant from prior cholecystectomy. IMPRESSION: 1. Slight gaseous distention of the right colon, but no definite evidence of bowel obstruction is noted. 2. Diffuse degenerative change throughout the lumbar spine. Electronically Signed   By: Ivar Drape M.D.   On: 10/07/2018 09:03     CBC Recent Labs  Lab 10/07/18 0946 10/10/18 1428 10/11/18 0501 10/12/18 0414  WBC 5.3 4.8 3.6* 4.0  HGB 14.2 13.1 13.5 12.8*  HCT 41.8 38.4* 39.8 38.4*  PLT 243 225 233 215  MCV 91.1 92.1 90.5 91.9  MCH 30.9 31.4 30.7 30.6  MCHC 34.0 34.1 33.9 33.3  RDW 12.6 12.5 12.5 12.5  LYMPHSABS 1.6  --   --   --   MONOABS 0.5  --   --   --   EOSABS 0.0  --   --   --   BASOSABS 0.0  --   --   --     Chemistries  Recent Labs  Lab 10/07/18 0946 10/09/18 0502 10/10/18 1055 10/12/18 0414  NA 139 138 138 138  K 3.7 3.3* 3.6 3.2*  CL 107 108 103 104  CO2 23 25 25 26   GLUCOSE 103* 91 99 92  BUN 20 14 11 13   CREATININE 1.08 0.98 1.13 1.12  CALCIUM 9.2 8.5* 9.1 8.9  MG  --   --   --  2.3  AST 17  --   --   --   ALT 11  --   --   --   ALKPHOS 57  --   --   --   BILITOT 1.1  --   --   --    ------------------------------------------------------------------------------------------------------------------ estimated creatinine clearance is 53.2 mL/min (by C-G formula based on SCr of 1.12 mg/dL). ------------------------------------------------------------------------------------------------------------------ No results for input(s): HGBA1C in the last 72 hours. ------------------------------------------------------------------------------------------------------------------ No results for input(s): CHOL, HDL, LDLCALC, TRIG, CHOLHDL, LDLDIRECT in the last 72 hours. ------------------------------------------------------------------------------------------------------------------ No results for  input(s): TSH, T4TOTAL, T3FREE, THYROIDAB in the last 72 hours.  Invalid input(s): FREET3 ------------------------------------------------------------------------------------------------------------------ No results for input(s): VITAMINB12, FOLATE, FERRITIN, TIBC, IRON, RETICCTPCT in the last 72 hours.  Coagulation profile Recent Labs  Lab 10/10/18 1428  INR 1.21    No results for input(s): DDIMER in the last 72 hours.  Cardiac Enzymes No results for input(s): CKMB, TROPONINI, MYOGLOBIN in the last 168 hours.  Invalid input(s): CK ------------------------------------------------------------------------------------------------------------------ Invalid input(s): POCBNP    Assessment & Plan   Persistent sigmoid volvulus- s/p endoscopic decompression by GI. -Surgery following -Continue full liquid diet for today -Repeat abdominal x-ray in the morning -Continue bowel regimen  Hypokalemia- K 3.2. Mag 2.3. -Replete and recheck  Paroxysmal atrial fibrillation-in NSR today -Continue heparin drip -Hold Eliquis due to potential for surgery, will restart on discharge  BPH-stable.  -Continue  Proscar and flomax  Anxiety-stable -Continue Xanax  DVT prophylaxis- on heparin drip, SCDs     Code Status Orders  (From admission, onward)         Start     Ordered   10/07/18 1524  Full code  Continuous     10/07/18 1523        Code Status History    This patient has a current code status but no historical code status.    Advance Directive Documentation     Most Recent Value  Type of Advance Directive  Living will, Healthcare Power of Attorney  Pre-existing out of facility DNR order (yellow form or pink MOST form)  -  "MOST" Form in Place?  -     Consults surgery, gastroenterology  DVT Prophylaxis heparin gtt, SCDs Lab Results  Component Value Date   PLT 215 10/12/2018   Time Spent in minutes   33 minutes. Greater than 50% of time spent in care coordination  and counseling patient regarding the condition and plan of care.  Berna Spare Wilborn Membreno M.D on 10/12/2018 at 1:20 PM  Between 7am to 6pm - Pager - [phone]  After 6pm go to [email or web address] - Librarian, academic  [phone]  

## 2018-10-12 NOTE — Consult Note (Addendum)
Pharmacy Electrolyte Monitoring Consult:  Pharmacy consulted to assist in monitoring and replacing electrolytes in this 85 y.o. male admitted on 10/07/2018 with Constipation and Abdominal Pain   Labs:  Sodium (mmol/L)  Date Value  10/12/2018 138  11/11/2015 141  10/06/2014 137   Potassium (mmol/L)  Date Value  10/12/2018 3.2 (L)  10/06/2014 3.6   Magnesium (mg/dL)  Date Value  11/17/2013 2.3   Phosphorus (mg/dL)  Date Value  07/12/2009 4.0   Calcium (mg/dL)  Date Value  10/12/2018 8.9   Calcium, Total (mg/dL)  Date Value  10/06/2014 8.2 (L)   Albumin (g/dL)  Date Value  10/07/2018 4.2  10/05/2014 3.5    Assessment/Plan: Electrolyte goals: potassium ~4.0, magnesium ~2.0.  KCl 40 mEq PO x 2 was ordered for today. No further electrolyte replacement is warranted at this time.  Will order electrolytes with AM labs.  Pharmacy will continue to monitor.  Paticia Stack, PharmD Pharmacy Resident  10/12/2018 9:18 AM

## 2018-10-12 NOTE — Consult Note (Signed)
[place] for Heparin infusion Indication: atrial fibrillation  Allergies  Allergen Reactions  . Morphine And Related     Shaking, back pain.    Patient Measurements: Height: 5\' 11"  (180.3 cm) Weight: 172 lb (78 kg) IBW/kg (Calculated) : 75.3 Heparin Dosing Weight: 78  Vital Signs: Temp: 98.2 F (36.8 C) (01/05 1235) Temp Source: Oral (01/05 1235) BP: 158/96 (01/05 1235) Pulse Rate: 70 (01/05 1235)  Labs: Recent Labs    10/10/18 1055  10/10/18 1428  10/11/18 0501 10/11/18 1450 10/12/18 0414 10/12/18 0743  HGB  --    < > 13.1  --  13.5  --  12.8*  --   HCT  --   --  38.4*  --  39.8  --  38.4*  --   PLT  --   --  225  --  233  --  215  --   APTT  --   --  34  --  156*  --   --   --   LABPROT  --   --  15.2  --   --   --   --   --   INR  --   --  1.21  --   --   --   --   --   HEPARINUNFRC  --   --   --    < >  --  1.19* 1.14* 1.13*  CREATININE 1.13  --   --   --   --   --  1.12  --    < > = values in this interval not displayed.    Estimated Creatinine Clearance: 53.2 mL/min (by C-G formula based on SCr of 1.12 mg/dL).   Medical History: Past Medical History:  Diagnosis Date  . Acute diverticulitis 12/15   [facility]  . Acute diverticulitis   . Allergic rhinitis   . Atrial fibrillation or flutter 12/10   Has been paroxysmal. Patient has not tolerated coumadin due to headaches. ECHO (12/10) Mild LVH, EF 55%,   . BPH (benign prostatic hyperplasia)   . Chest pain 12/10   Lexiscan myoview with EF 55%, no ischemia or infarction  . Dysrhythmia    AFIB  . HOH (hard of hearing)   . Hyperlipemia   . Hypertension   . Osteoarthritis   . Pacemaker  medtronic 02/06/2010   Qualifier: Diagnosis of  By: Lovena Le, MD, [facility], Binnie Kand   . Presence of permanent cardiac pacemaker   . Sick sinus syndrome (HCC)    S/P dual chamber Medtronic PCM    Medications:  Medications Prior to Admission  Medication Sig Dispense Refill Last Dose  .  ALPRAZolam (XANAX) 0.25 MG tablet TAKE 1 TABLET BY MOUTH TWICE DAILY AS NEEDED FOR ANXIETY 60 tablet 0 Past Month at Unknown time  . apixaban (ELIQUIS) 2.5 MG TABS tablet Take 2.5 mg by mouth 2 (two) times daily.   10/06/2018 at 2000  . ciprofloxacin (CIPRO) 250 MG tablet Take 1 tablet (250 mg total) by mouth 2 (two) times daily. 14 tablet 0 10/06/2018 at 2000  . diltiazem (TIAZAC) 180 MG 24 hr capsule Take 180 mg by mouth daily.   10/06/2018 at 2000  . finasteride (PROSCAR) 5 MG tablet Take 1 tablet (5 mg total) by mouth daily. 90 tablet 3 10/06/2018 at 0800  . flecainide (TAMBOCOR) 100 MG tablet Take 1 tablet (100 mg total) by mouth 2 (two) times daily. 180 tablet 3 10/06/2018 at  200  . Glucosamine 500 MG CAPS Take by mouth daily.     10/06/2018 at 0800  . ibuprofen (ADVIL,MOTRIN) 200 MG tablet Take 200 mg by mouth every 6 (six) hours as needed for moderate pain.   10/07/2018 at 0400  . metroNIDAZOLE (FLAGYL) 500 MG tablet Take 1 tablet (500 mg total) by mouth 3 (three) times daily. 21 tablet 0 10/06/2018 at 2000  . tamsulosin (FLOMAX) 0.4 MG CAPS capsule TAKE ONE CAPSULE BY MOUTH EVERY DAY 30 capsule 11 10/06/2018 at 2000  . acetaminophen (TYLENOL) 325 MG tablet Take 650 mg by mouth every 6 (six) hours as needed. Reported on 01/17/2016   Not Taking at Unknown time    Assessment: 85 y.o. male with a known history of atrial fibrillation, BPH, hyperlipidemia, hypertension, osteoarthritis who is presenting with constipation for 9 days as well as abdominal pain.  Patient had endoscopic decompression.  Now with possible need for sigmoid colectomy.  Due to this potential need patient had Apixiban held since 1/1 @ 0930 being the last dose.  Patient at present time not wanting to proceed with procedure but understanding that if he clinically deteriorates will need to have this completed.  Therefore the patient will be started on a heparin infusion.  Goal of Therapy:  Heparin level 0.3-0.7 units/ml Monitor  platelets by anticoagulation protocol: Yes   Plan:  Anti-Xa level remains elevated. Will hold heparin drip x 1 hour and resume at 400 units/hr. Will obtain aPTT and heparin level at 0030.   Pharmacy will continue to monitor and adjust per consult.   Shawonda Kerce L, 10/12/2018 3:34 PM

## 2018-10-12 NOTE — Progress Notes (Signed)
This is a patient being followed by Dr. Tama High.  He was seen on rounds today.  The patient was admitted with a sigmoid volvulus that was treated with endoscopic decompression.  There is some question about whether or not he has persistent volvulus, but there is no evidence for ischemia.  He denies abdominal pain this morning. Full liquids initiated yesterday and he has tolerated this.[name]  He is being given a bowel regimen due to severe constipation.  He reports some response to this, having several bowel movements over the past 24 hours, including a very large one this morning.  Once again, there has been no output recorded by nursing.  Past Medical History:  Diagnosis Date  . Acute diverticulitis 12/15   [facility]  . Acute diverticulitis   . Allergic rhinitis   . Atrial fibrillation or flutter 12/10   Has been paroxysmal. Patient has not tolerated coumadin due to headaches. ECHO (12/10) Mild LVH, EF 55%,   . BPH (benign prostatic hyperplasia)   . Chest pain 12/10   Lexiscan myoview with EF 55%, no ischemia or infarction  . Dysrhythmia    AFIB  . HOH (hard of hearing)   . Hyperlipemia   . Hypertension   . Osteoarthritis   . Pacemaker  medtronic 02/06/2010   Qualifier: Diagnosis of  By: Lovena Le, MD, [facility] Binnie Kand   . Presence of permanent cardiac pacemaker   . Sick sinus syndrome (HCC)    S/P dual chamber Medtronic PCM   Past Surgical History:  Procedure Laterality Date  . CATARACT EXTRACTION W/PHACO Left 01/16/2017   Procedure: CATARACT EXTRACTION PHACO AND INTRAOCULAR LENS PLACEMENT (IOC);  Surgeon: Estill Cotta, MD;  Location: [facility] ORS;  Service: Ophthalmology;  Laterality: Left;  [place] 1:51.3 AP% 25.9 CDE 54.80 FLUID PACK LOT # [id number]  . COLONOSCOPY WITH PROPOFOL N/A 09/15/2018   Procedure: COLONOSCOPY WITH PROPOFOL;  Surgeon: Lin Landsman, MD;  Location: [facility] ENDOSCOPY;  Service: Gastroenterology;  Laterality: N/A;  . dual chamber pacemaker  01/11   Dr. Lovena Le  .  FLEXIBLE SIGMOIDOSCOPY N/A 10/07/2018   Procedure: FLEXIBLE SIGMOIDOSCOPY;  Surgeon: Virgel Manifold, MD;  Location: [facility] ENDOSCOPY;  Service: Endoscopy;  Laterality: N/A;  . LAPAROSCOPIC CHOLECYSTECTOMY  1/14   Dr Bary Castilla  . VASECTOMY  in his 55's   Family History  Problem Relation Age of Onset  . Hypertension Mother   . Alcohol abuse Father   . Bladder Cancer Neg Hx   . Prostate cancer Neg Hx   . Kidney cancer Neg Hx    Social History   Tobacco Use  . Smoking status: Former Research scientist (life sciences)  . Smokeless tobacco: Never Used  . Tobacco comment: In [place], quit in 1960's  Substance Use Topics  . Alcohol use: No  . Drug use: No   Current Meds  Medication Sig  . ALPRAZolam (XANAX) 0.25 MG tablet TAKE 1 TABLET BY MOUTH TWICE DAILY AS NEEDED FOR ANXIETY  . apixaban (ELIQUIS) 2.5 MG TABS tablet Take 2.5 mg by mouth 2 (two) times daily.  . ciprofloxacin (CIPRO) 250 MG tablet Take 1 tablet (250 mg total) by mouth 2 (two) times daily.  [name] diltiazem (TIAZAC) 180 MG 24 hr capsule Take 180 mg by mouth daily.  . finasteride (PROSCAR) 5 MG tablet Take 1 tablet (5 mg total) by mouth daily.  . flecainide (TAMBOCOR) 100 MG tablet Take 1 tablet (100 mg total) by mouth 2 (two) times daily.  . Glucosamine 500 MG CAPS Take by mouth  daily.    . ibuprofen (ADVIL,MOTRIN) 200 MG tablet Take 200 mg by mouth every 6 (six) hours as needed for moderate pain.  . metroNIDAZOLE (FLAGYL) 500 MG tablet Take 1 tablet (500 mg total) by mouth 3 (three) times daily.  . tamsulosin (FLOMAX) 0.4 MG CAPS capsule TAKE ONE CAPSULE BY MOUTH EVERY DAY   Allergies  Allergen Reactions  . Morphine And Related     Shaking, back pain.   Vitals:   10/11/18 2327 10/12/18 0450  BP: 137/80 (!) 154/69  Pulse: 61 60  Resp: 20 16  Temp:  97.7 F (36.5 C)  SpO2: 99% 98%   I/O last 3 completed shifts: In: 653.8 [P.O.:420; I.V.:233.8] Out: 0  Total I/O In: 75.4 [I.V.:75.4] Out: -   Physical Exam  Constitutional: He is  oriented to person, place, and time. He appears well-developed and well-nourished. No distress.  HENT:  Head: Normocephalic and atraumatic.  Mouth/Throat: No oropharyngeal exudate.  Eyes: Pupils are equal, round, and reactive to light. No scleral icterus.  Neck: Normal range of motion.  Cardiovascular: Normal rate.  irreg irreg  Pulmonary/Chest: Effort normal.  Abdominal: Soft. He exhibits no distension. There is no abdominal tenderness. There is no rebound and no guarding.  Musculoskeletal:        General: No edema.  Neurological: He is alert and oriented to person, place, and time.  Skin: Skin is warm.  Psychiatric: He has a normal mood and affect.   A/P: 85 year old male admitted after endoscopic decompression of sigmoid volvulus.  Despite radiographic imaging, he does not have a clinical exam consistent with assistant torsion.  He certainly does not have any signs of intestinal ischemia.  He does have chronic constipation.  He will continue the oral bowel regimen recommended by Dr. Rosana Hoes. Continue full liquids, for now.  He may be considered for elective sigmoid colectomy at some point in the future, however there is no emergent need for surgical intervention at this time. Will order repeat abdominal films for tomorrow AM.

## 2018-10-13 ENCOUNTER — Inpatient Hospital Stay: Payer: Medicare Other

## 2018-10-13 ENCOUNTER — Inpatient Hospital Stay: Payer: Medicare Other | Admitting: Anesthesiology

## 2018-10-13 ENCOUNTER — Encounter
Admission: EM | Disposition: A | Discharge status: Home / Self Care | Payer: Self-pay | Source: Home / Self Care | Attending: Internal Medicine

## 2018-10-13 DIAGNOSIS — K562 Volvulus: Secondary | ICD-10-CM

## 2018-10-13 HISTORY — PX: PARTIAL COLECTOMY: SHX5273

## 2018-10-13 LAB — BASIC METABOLIC PANEL
Anion gap: 7 (ref 5–15)
BUN: 12 mg/dL (ref 8–23)
CHLORIDE: 108 mmol/L (ref 98–111)
CO2: 25 mmol/L (ref 22–32)
Calcium: 9 mg/dL (ref 8.9–10.3)
Creatinine, Ser: 1.04 mg/dL (ref 0.61–1.24)
GFR calc Af Amer: >60 mL/min (ref >60)
GFR calc non Af Amer: >60 mL/min (ref >60)
Glucose, Bld: 100 mg/dL — ABNORMAL HIGH (ref 70–99)
Potassium: 3.3 mmol/L — ABNORMAL LOW (ref 3.5–5.1)
Sodium: 140 mmol/L (ref 135–145)

## 2018-10-13 LAB — HEPARIN LEVEL (UNFRACTIONATED): Heparin Unfractionated: 0.98 IU/mL — ABNORMAL HIGH (ref 0.30–0.70)

## 2018-10-13 LAB — CBC
HCT: 37 % — ABNORMAL LOW (ref 39.0–52.0)
HEMOGLOBIN: 12.6 g/dL — AB (ref 13.0–17.0)
MCH: 30.7 pg (ref 26.0–34.0)
MCHC: 34.1 g/dL (ref 30.0–36.0)
MCV: 90 fL (ref 80.0–100.0)
Platelets: 212 10*3/uL (ref 150–400)
RBC: 4.11 MIL/uL — ABNORMAL LOW (ref 4.22–5.81)
RDW: 12.5 % (ref 11.5–15.5)
WBC: 4.5 10*3/uL (ref 4.0–10.5)
nRBC: 0 % (ref 0.0–0.2)

## 2018-10-13 LAB — APTT: aPTT: 42 seconds — ABNORMAL HIGH (ref 24–36)

## 2018-10-13 LAB — MAGNESIUM: Magnesium: 2.3 mg/dL (ref 1.7–2.4)

## 2018-10-13 SURGERY — COLECTOMY, PARTIAL
Anesthesia: General

## 2018-10-13 MED ORDER — POTASSIUM CHLORIDE CRYS ER 20 MEQ PO TBCR: 40.0000 meq | EXTENDED_RELEASE_TABLET | Freq: Once | ORAL | Status: DC

## 2018-10-13 MED ORDER — ALVIMOPAN 12 MG PO CAPS
12.0000 mg | ORAL_CAPSULE | ORAL | Status: AC
  Administered 2018-10-13: 12 mg via ORAL

## 2018-10-13 MED ORDER — LIDOCAINE HCL (PF) 2 % IJ SOLN
INTRAMUSCULAR | Status: AC
  Filled 2018-10-13: qty 10

## 2018-10-13 MED ORDER — ACETAMINOPHEN 325 MG PO TABS: 325.0000 mg | ORAL_TABLET | ORAL | Status: DC | PRN

## 2018-10-13 MED ORDER — SODIUM CHLORIDE 0.9 % IV SOLN
2.0000 g | Freq: Once | INTRAVENOUS | Status: DC
  Filled 2018-10-13: qty 2

## 2018-10-13 MED ORDER — DEXAMETHASONE SODIUM PHOSPHATE 10 MG/ML IJ SOLN
INTRAMUSCULAR | Status: DC | PRN
  Administered 2018-10-13: 10 mg via INTRAVENOUS

## 2018-10-13 MED ORDER — PROMETHAZINE HCL 25 MG/ML IJ SOLN: 6.2500 mg | INTRAMUSCULAR | Status: DC | PRN

## 2018-10-13 MED ORDER — ENOXAPARIN SODIUM 40 MG/0.4ML ~~LOC~~ SOLN
40.0000 mg | SUBCUTANEOUS | Status: DC
  Administered 2018-10-14 – 2018-10-16 (×3): 40 mg via SUBCUTANEOUS
  Filled 2018-10-13 (×3): qty 0.4

## 2018-10-13 MED ORDER — CHLORHEXIDINE GLUCONATE CLOTH 2 % EX PADS: 6.0000 | MEDICATED_PAD | Freq: Once | CUTANEOUS | Status: DC

## 2018-10-13 MED ORDER — PROPOFOL 10 MG/ML IV BOLUS
INTRAVENOUS | Status: AC
  Filled 2018-10-13: qty 20

## 2018-10-13 MED ORDER — DEXAMETHASONE SODIUM PHOSPHATE 10 MG/ML IJ SOLN
INTRAMUSCULAR | Status: AC
  Filled 2018-10-13: qty 1

## 2018-10-13 MED ORDER — PHENYLEPHRINE HCL 10 MG/ML IJ SOLN
INTRAMUSCULAR | Status: DC | PRN
  Administered 2018-10-13: 50 ug via INTRAVENOUS
  Administered 2018-10-13: 100 ug via INTRAVENOUS
  Administered 2018-10-13 (×3): 50 ug via INTRAVENOUS

## 2018-10-13 MED ORDER — HYDROMORPHONE HCL 1 MG/ML IJ SOLN
0.5000 mg | INTRAMUSCULAR | Status: DC | PRN
  Administered 2018-10-13 – 2018-10-14 (×2): 0.5 mg via INTRAVENOUS
  Filled 2018-10-13 (×2): qty 0.5

## 2018-10-13 MED ORDER — FENTANYL CITRATE (PF) 100 MCG/2ML IJ SOLN
INTRAMUSCULAR | Status: AC
  Filled 2018-10-13: qty 2

## 2018-10-13 MED ORDER — SUGAMMADEX SODIUM 200 MG/2ML IV SOLN
INTRAVENOUS | Status: DC | PRN
  Administered 2018-10-13: 160 mg via INTRAVENOUS

## 2018-10-13 MED ORDER — HYDROCODONE-ACETAMINOPHEN 5-325 MG PO TABS
1.0000 | ORAL_TABLET | ORAL | Status: DC | PRN
  Administered 2018-10-13: 2 via ORAL
  Filled 2018-10-13: qty 2

## 2018-10-13 MED ORDER — SEVOFLURANE IN SOLN
RESPIRATORY_TRACT | Status: AC
  Filled 2018-10-13: qty 250

## 2018-10-13 MED ORDER — LACTATED RINGERS IV SOLN
INTRAVENOUS | Status: DC
  Administered 2018-10-13 – 2018-10-16 (×4): via INTRAVENOUS

## 2018-10-13 MED ORDER — IOPAMIDOL (ISOVUE-300) INJECTION 61%
100.0000 mL | Freq: Once | INTRAVENOUS | Status: AC | PRN
  Administered 2018-10-13: 100 mL via INTRAVENOUS

## 2018-10-13 MED ORDER — ROCURONIUM BROMIDE 100 MG/10ML IV SOLN
INTRAVENOUS | Status: DC | PRN
  Administered 2018-10-13: 20 mg via INTRAVENOUS
  Administered 2018-10-13: 50 mg via INTRAVENOUS
  Administered 2018-10-13: 10 mg via INTRAVENOUS

## 2018-10-13 MED ORDER — ONDANSETRON HCL 4 MG/2ML IJ SOLN
INTRAMUSCULAR | Status: AC
  Filled 2018-10-13: qty 2

## 2018-10-13 MED ORDER — SODIUM CHLORIDE 0.9 % IV SOLN
INTRAVENOUS | Status: DC | PRN
  Administered 2018-10-13: 2 g via INTRAVENOUS

## 2018-10-13 MED ORDER — LIDOCAINE HCL (CARDIAC) PF 100 MG/5ML IV SOSY
PREFILLED_SYRINGE | INTRAVENOUS | Status: DC | PRN
  Administered 2018-10-13: 100 mg via INTRAVENOUS

## 2018-10-13 MED ORDER — ACETAMINOPHEN 500 MG PO TABS
1000.0000 mg | ORAL_TABLET | Freq: Four times a day (QID) | ORAL | Status: DC
  Administered 2018-10-14 – 2018-10-16 (×7): 1000 mg via ORAL
  Filled 2018-10-13 (×8): qty 2

## 2018-10-13 MED ORDER — ACETAMINOPHEN 160 MG/5ML PO SOLN
325.0000 mg | ORAL | Status: DC | PRN
  Filled 2018-10-13: qty 10.2

## 2018-10-13 MED ORDER — BUPIVACAINE LIPOSOME 1.3 % IJ SUSP: 20.0000 mL | Freq: Once | INTRAMUSCULAR | Status: DC

## 2018-10-13 MED ORDER — HYDROMORPHONE HCL 1 MG/ML IJ SOLN
INTRAMUSCULAR | Status: DC | PRN
  Administered 2018-10-13 (×2): .2 mg via INTRAVENOUS
  Administered 2018-10-13: .1 mg via INTRAVENOUS

## 2018-10-13 MED ORDER — PIPERACILLIN-TAZOBACTAM 3.375 G IVPB
3.3750 g | Freq: Three times a day (TID) | INTRAVENOUS | Status: DC
  Administered 2018-10-13 – 2018-10-16 (×9): 3.375 g via INTRAVENOUS
  Filled 2018-10-13 (×9): qty 50

## 2018-10-13 MED ORDER — ACETAMINOPHEN 500 MG PO TABS: 1000.0000 mg | ORAL_TABLET | ORAL | Status: DC

## 2018-10-13 MED ORDER — BUPIVACAINE LIPOSOME 1.3 % IJ SUSP
INTRAMUSCULAR | Status: AC
  Filled 2018-10-13: qty 20

## 2018-10-13 MED ORDER — SUCCINYLCHOLINE CHLORIDE 20 MG/ML IJ SOLN
INTRAMUSCULAR | Status: DC | PRN
  Administered 2018-10-13: 140 mg via INTRAVENOUS

## 2018-10-13 MED ORDER — PROPOFOL 10 MG/ML IV BOLUS
INTRAVENOUS | Status: DC | PRN
  Administered 2018-10-13: 40 mg via INTRAVENOUS
  Administered 2018-10-13: 110 mg via INTRAVENOUS

## 2018-10-13 MED ORDER — ALVIMOPAN 12 MG PO CAPS
12.0000 mg | ORAL_CAPSULE | Freq: Two times a day (BID) | ORAL | Status: DC
  Administered 2018-10-14 – 2018-10-15 (×4): 12 mg via ORAL
  Filled 2018-10-13 (×7): qty 1

## 2018-10-13 MED ORDER — MEPERIDINE HCL 50 MG/ML IJ SOLN: 6.2500 mg | INTRAMUSCULAR | Status: DC | PRN

## 2018-10-13 MED ORDER — BUPIVACAINE HCL (PF) 0.5 % IJ SOLN
INTRAMUSCULAR | Status: AC
  Filled 2018-10-13: qty 30

## 2018-10-13 MED ORDER — LACTATED RINGERS IV SOLN
INTRAVENOUS | Status: DC
  Administered 2018-10-13 (×2): via INTRAVENOUS

## 2018-10-13 MED ORDER — SUCCINYLCHOLINE CHLORIDE 20 MG/ML IJ SOLN
INTRAMUSCULAR | Status: AC
  Filled 2018-10-13: qty 1

## 2018-10-13 MED ORDER — MIDAZOLAM HCL 2 MG/2ML IJ SOLN
INTRAMUSCULAR | Status: AC
  Filled 2018-10-13: qty 2

## 2018-10-13 MED ORDER — ACETAMINOPHEN 10 MG/ML IV SOLN
INTRAVENOUS | Status: AC
  Filled 2018-10-13: qty 100

## 2018-10-13 MED ORDER — EPHEDRINE SULFATE 50 MG/ML IJ SOLN
INTRAMUSCULAR | Status: DC | PRN
  Administered 2018-10-13 (×2): 10 mg via INTRAVENOUS
  Administered 2018-10-13: 5 mg via INTRAVENOUS

## 2018-10-13 MED ORDER — OXYCODONE HCL 5 MG PO TABS
5.0000 mg | ORAL_TABLET | ORAL | Status: DC | PRN
  Administered 2018-10-14 – 2018-10-15 (×2): 5 mg via ORAL
  Filled 2018-10-13 (×2): qty 1

## 2018-10-13 MED ORDER — ALVIMOPAN 12 MG PO CAPS
ORAL_CAPSULE | ORAL | Status: AC
  Administered 2018-10-13: 12 mg via ORAL
  Filled 2018-10-13: qty 1

## 2018-10-13 MED ORDER — EPHEDRINE SULFATE 50 MG/ML IJ SOLN
INTRAMUSCULAR | Status: AC
  Filled 2018-10-13: qty 1

## 2018-10-13 MED ORDER — KETOROLAC TROMETHAMINE 15 MG/ML IJ SOLN
15.0000 mg | Freq: Four times a day (QID) | INTRAMUSCULAR | Status: DC
  Administered 2018-10-14 – 2018-10-16 (×10): 15 mg via INTRAVENOUS
  Filled 2018-10-13 (×10): qty 1

## 2018-10-13 MED ORDER — HYDROMORPHONE HCL 1 MG/ML IJ SOLN
INTRAMUSCULAR | Status: AC
  Filled 2018-10-13: qty 1

## 2018-10-13 MED ORDER — IOPAMIDOL (ISOVUE-300) INJECTION 61%: 15.0000 mL | INTRAVENOUS | Status: DC

## 2018-10-13 MED ORDER — FENTANYL CITRATE (PF) 100 MCG/2ML IJ SOLN
INTRAMUSCULAR | Status: DC | PRN
  Administered 2018-10-13: 50 ug via INTRAVENOUS
  Administered 2018-10-13: 100 ug via INTRAVENOUS
  Administered 2018-10-13: 50 ug via INTRAVENOUS

## 2018-10-13 MED ORDER — FENTANYL CITRATE (PF) 100 MCG/2ML IJ SOLN
25.0000 ug | INTRAMUSCULAR | Status: DC | PRN
  Administered 2018-10-13 (×2): 50 ug via INTRAVENOUS

## 2018-10-13 MED ORDER — BUPIVACAINE LIPOSOME 1.3 % IJ SUSP
INTRAMUSCULAR | Status: DC | PRN
  Administered 2018-10-13: 20 mL

## 2018-10-13 MED ORDER — ONDANSETRON HCL 4 MG/2ML IJ SOLN
INTRAMUSCULAR | Status: DC | PRN
  Administered 2018-10-13: 4 mg via INTRAVENOUS

## 2018-10-13 MED ORDER — MORPHINE SULFATE (PF) 2 MG/ML IV SOLN: 2.0000 mg | INTRAVENOUS | Status: DC | PRN

## 2018-10-13 MED ORDER — ROCURONIUM BROMIDE 50 MG/5ML IV SOLN
INTRAVENOUS | Status: AC
  Filled 2018-10-13: qty 1

## 2018-10-13 SURGICAL SUPPLY — 51 items
APPLIER CLIP 11 MED OPEN (CLIP)
APPLIER CLIP 13 LRG OPEN (CLIP)
BULB RESERV EVAC DRAIN JP 100C (MISCELLANEOUS) ×4 IMPLANT
CHLORAPREP W/TINT 26ML (MISCELLANEOUS) ×3 IMPLANT
CLIP APPLIE 11 MED OPEN (CLIP) IMPLANT
CLIP APPLIE 13 LRG OPEN (CLIP) IMPLANT
COVER WAND RF STERILE (DRAPES) ×1 IMPLANT
DRAIN CHANNEL JP 19F (MISCELLANEOUS) ×4 IMPLANT
DRAPE LAPAROTOMY 100X77 ABD (DRAPES) ×2 IMPLANT
DRSG OPSITE POSTOP 4X10 (GAUZE/BANDAGES/DRESSINGS) ×2 IMPLANT
DRSG OPSITE POSTOP 4X8 (GAUZE/BANDAGES/DRESSINGS) IMPLANT
ELECT BLADE 6 FLAT ULTRCLN (ELECTRODE) IMPLANT
ELECT REM PT RETURN 9FT ADLT (ELECTROSURGICAL) ×3
ELECTRODE REM PT RTRN 9FT ADLT (ELECTROSURGICAL) ×1 IMPLANT
EXTRT SYSTEM ALEXIS 17CM (MISCELLANEOUS)
GLOVE BIO SURGEON STRL SZ7 (GLOVE) ×10 IMPLANT
GLOVE BIOGEL PI IND STRL 7.5 (GLOVE) ×2 IMPLANT
GLOVE BIOGEL PI INDICATOR 7.5 (GLOVE) ×4
GOWN STRL REUS W/TWL LRG LVL3 (GOWN DISPOSABLE) ×20 IMPLANT
HANDLE SUCTION POOLE (INSTRUMENTS) ×1 IMPLANT
HANDLE YANKAUER SUCT BULB TIP (MISCELLANEOUS) ×5 IMPLANT
LIGASURE IMPACT 36 18CM CVD LR (INSTRUMENTS) ×3 IMPLANT
NDL HYPO 18GX1.5 BLUNT FILL (NEEDLE) ×1 IMPLANT
NDL HYPO 21X1.5 SAFETY (NEEDLE) ×1 IMPLANT
NEEDLE HYPO 18GX1.5 BLUNT FILL (NEEDLE) ×3 IMPLANT
NEEDLE HYPO 21X1.5 SAFETY (NEEDLE) ×3 IMPLANT
NS IRRIG 1000ML POUR BTL (IV SOLUTION) ×22 IMPLANT
PACK BASIN MAJOR ARMC (MISCELLANEOUS) ×3 IMPLANT
PACK COLON CLEAN CLOSURE (MISCELLANEOUS) ×3 IMPLANT
RELOAD PROXIMATE 75MM BLUE (ENDOMECHANICALS) ×15 IMPLANT
RELOAD STAPLE 75 3.8 BLU REG (ENDOMECHANICALS) IMPLANT
RETRACTOR WND ALEXIS-O 25 LRG (MISCELLANEOUS) ×1 IMPLANT
RTRCTR WOUND ALEXIS O 25CM LRG (MISCELLANEOUS) ×3
SOL .9 NS 3000ML IRR  AL (IV SOLUTION) ×6
SOL .9 NS 3000ML IRR UROMATIC (IV SOLUTION) IMPLANT
SPONGE DRAIN TRACH 4X4 STRL 2S (GAUZE/BANDAGES/DRESSINGS) ×2 IMPLANT
SPONGE LAP 18X18 RF (DISPOSABLE) ×9 IMPLANT
STAPLER PROXIMATE 75MM BLUE (STAPLE) ×4 IMPLANT
SUCTION POOLE HANDLE (INSTRUMENTS) ×3
SUT ETHILON 3-0 FS-10 30 BLK (SUTURE) ×6
SUT PDS AB 1 TP1 96 (SUTURE) ×3 IMPLANT
SUT SILK 2 0 (SUTURE)
SUT SILK 2-0 18XBRD TIE 12 (SUTURE) IMPLANT
SUT SILK 3-0 (SUTURE) ×3 IMPLANT
SUT VIC AB 3-0 SH 27 (SUTURE) ×4
SUT VIC AB 3-0 SH 27X BRD (SUTURE) ×2 IMPLANT
SUTURE EHLN 3-0 FS-10 30 BLK (SUTURE) IMPLANT
SYR 20CC LL (SYRINGE) ×3 IMPLANT
SYSTEM CONTND EXTRCTN KII BLLN (MISCELLANEOUS) ×1 IMPLANT
TOWEL OR 17X26 4PK STRL BLUE (TOWEL DISPOSABLE) ×3 IMPLANT
TRAY FOLEY MTR SLVR 16FR STAT (SET/KITS/TRAYS/PACK) ×3 IMPLANT

## 2018-10-13 NOTE — Op Note (Signed)
SURGICAL OPERATIVE REPORT  DATE OF PROCEDURE: 10/13/2018  ATTENDING: Surgeon(s): Vickie Epley, MD  ASSISTANT(S): Olean Ree, MD and Nestor Lewandowsky, MD  ANESTHESIA: GETA  PRE-OPERATIVE DIAGNOSIS: Partially Obstructing Sigmoid Volvulus without ischemia (icd-10: K56.2)  POST-OPERATIVE DIAGNOSIS: Partially Obstructing Sigmoid Volvulus without ischemia (icd-10: K56.2)  PROCEDURE(S): (cpt's: 37106) 1.) Sigmoid and partial descending colectomy with primary linear stapled anastomosis  INTRAOPERATIVE FINDINGS: Massively dilated and redundant sigmoid colon without evidence of ischemia   INTRAVENOUS FLUIDS: 2300 mL crystalloid   ESTIMATED BLOOD LOSS: 50 mL   URINE OUTPUT: 200 mL   SPECIMENS: Sigmoid and partial descending colon   IMPLANTS: None  DRAINS: LLQ 64F round drain to Splenic flexure and Left paracolic gutter, LUQ 26R round drain to pelvis and LLQ retroperitoneum  COMPLICATIONS: None apparent   CONDITION AT END OF PROCEDURE: Hemodynamically stable and extubated   DISPOSITION OF PATIENT: PACU  INDICATIONS FOR PROCEDURE:  Patient is a 85 y.o. male who presented to [facility] ED for abdominal pain, distention, and constipation, was found on CT to have a sigmoid volvulus, which was endoscopically decompressed without endoscopic placement of a decompressing rectal tube. Subsequently, while radiograph continue to have appearance of sigmoid volvulus, he continued to pass +flatus and +BM's with overall improving abdominal pain and distention, but neither his pain nor his bowel function completely resolved, and while follow-up CT did not confirm pneumatosis or pneumoperitoneum suspected on abdominal x-ray, partially obstructing sigmoid volvulus did not resolve either. All risks, benefits, and alternatives to open colectomy with possible ostomy were discussed with the patient and his family, all of their questions were answered to their expressed satisfaction, and informed consent was  obtained.  DETAILS OF PROCEDURE: Patient was brought to the operating suite and appropriately identified. General anesthesia was administered along with appropriate pre-operative antibiotics, and endotracheal intubation with placement of NG tube were performed by anesthetist. In lithotomy position, operative site was prepped and draped in the usual sterile fashion, and following a brief time out, a lower vertical midline incision was made from the Left side of the umbilicus inferiorly to just above the pubis using a #10 blade scalpel and extended deep through subcutaneous tissues until fascia was visualized and exposed along the linea alba midline between the rectus abdominal muscles. Lower midline fascia was then divided in the midline. Upon dissecting through preperitoneal fat, the peritoneum was entered and opened along the length of the incision, taking care to avoid injury to underlying dilated bowel.  Self-retaining wound protector/retractor was placed, and massively dilated and exceptionally redundant sigmoid-descending colon was externalized and untwisted. Abdominal cavity was explored, and placement of the NG tube was confirmed. Before the small bowel was able to be retracted superiorly and to the Right, rather extensive dense fibrotic adhesions between the sigmoid colonic mesentery and small bowel mesentery were carefully lysed using blunt dissection and Ligasure bipolar electrocautery. The descending and proximal sigmoid colon were then retracted medially using a moist towel. Using a combination of blunt dissection and electrocautery, the colon was freed from its lateral peritoneal attachments along the white line of Toldt distally approaching the rectosigmoid junction and proximally from the splenic flexure. During this dissection, injury to the ureters was avoided, the Left ureter of which was readily identified and protected. The splenic flexure did not need to be taken down in this case. Points  of transection were selected distally and proximally, and a 3-0 silk pursestring suture was created in the middle of the sigmoid colon planned to be resected, in  the middle of which a colotomy was created, through which pool suction was advanced and utilized to promptly decompress air and loose fecal material both proximally and distally.   The proximal bowel was stapled and divided using a GIA-75 linear cutting stapler, after which the distal colon was stapled and divided using an GIA-75 linear cutting stapler reload. Peritoneum was then scored along the mesentary with electrocautery, and the vessels were cauterized, sealed, and cut/clamped and tied using the Ligasure. Colon specimen was then handed off of the field as specimen for pathology. The two stapled ends of antimesenteric colon were confirmed to lay adjacent and parallel without tension, and a colotomy was made, through which to place linear cutting stapler reload. Unfortunately, despite prior colonic decompression, a large volume of liquid feces erupted from the colotomy around half of the GIA stapler, and the next 1.5 hours was spent irrigating and irrigating liquid and particulate fecal matter and further decompressing/emptying liquid fecal material from the colon in order to safely perform anastomosis. Though primary anastomosis was reconsidered and discussed, bowel remained clearly viable and healthy, amenable to primary linear stapled anastomosis. Replacement GIA-75 stapler reload was again advanced though antimesenteric colotomies and used to create the colonic anastomosis and to then likewise close the resulting colonic defect.  The staple line was inspected and found to be intact, widely patient, and hemostatic. Interrupted 3-0 silk Lembert sutures were then used to approximate tissue over the new anastomosis. The abdominal cavity was very copiously irrigated with several more liters of warm saline, and hemostasis was once more confirmed. Two  56F round drains were placed with the LUQ drain to the LLQ retroperitoneum and pelvis and the LLQ drain to the LUQ and Left pericolic gutter. The abdominal wall was re-approximated in layers with #1 looped PDS sutures from the top and bottom of the incision. Exparel (72-hour release liposomal formulation of bupivicane) was injected into fascia and subcutaneously, buried interrupted 3-0 Vicryl sutures were used to re-approximate dermis, and surgical skin staples were used to re-approximate skin. Skin was then cleaned and dried, and a sterile dressing was applied. Patient was then safely able to be extubated, awakened, and transferred to PACU for post-operative monitoring and care.  I was present for all aspects of the above procedure, and no operative complications were apparent.

## 2018-10-13 NOTE — Consult Note (Signed)
Pharmacy Electrolyte Monitoring Consult:  Pharmacy consulted to assist in monitoring and replacing electrolytes in this 85 y.o. male admitted on 10/07/2018 with Constipation and Abdominal Pain   Labs:  Sodium (mmol/L)  Date Value  10/13/2018 140  11/11/2015 141  10/06/2014 137   Potassium (mmol/L)  Date Value  10/13/2018 3.3 (L)  10/06/2014 3.6   Magnesium (mg/dL)  Date Value  10/13/2018 2.3   Phosphorus (mg/dL)  Date Value  07/12/2009 4.0   Calcium (mg/dL)  Date Value  10/13/2018 9.0   Calcium, Total (mg/dL)  Date Value  10/06/2014 8.2 (L)   Albumin (g/dL)  Date Value  10/07/2018 4.2  10/05/2014 3.5    Assessment/Plan: Electrolyte goals: potassium ~4.0, magnesium ~2.0. K 3.3  Mag 2.3 Will order KCL 40 meq PO x1.  Will order electrolytes with AM labs.  Pharmacy will continue to monitor.  Chinita Greenland PharmD Clinical Pharmacist 10/13/2018

## 2018-10-13 NOTE — Progress Notes (Signed)
Pharmacy Antibiotic Note  [name] is a 85 y.o. male admitted on 10/07/2018 with intra abdominal infection.  Pharmacy has been consulted for Zosyn  dosing.  Plan: Zosyn 3.375g IV q8h (4 hour infusion).  Height: 5\' 11"  (180.3 cm) Weight: 172 lb (78 kg) IBW/kg (Calculated) : 75.3  Temp (24hrs), Avg:97.9 F (36.6 C), Min:97.5 F (36.4 C), Max:98.4 F (36.9 C)  Recent Labs  Lab 10/07/18 0946 10/09/18 0502 10/10/18 1055 10/10/18 1428 10/11/18 0501 10/12/18 0414 10/13/18 0033  WBC 5.3  --   --  4.8 3.6* 4.0 4.5  CREATININE 1.08 0.98 1.13  --   --  1.12 1.04    Estimated Creatinine Clearance: 57.3 mL/min (by C-G formula based on SCr of 1.04 mg/dL).    Allergies  Allergen Reactions  . Morphine And Related     Shaking, back pain.    Antimicrobials this admission:   >>    >>   Dose adjustments this admission:   Microbiology results:  BCx:  UCx:    Sputum:    MRSA PCR:   Thank you for allowing pharmacy to be a part of this patient's care.  Mykayla Brinton D 10/13/2018 9:31 PM

## 2018-10-13 NOTE — Care Management Important Message (Signed)
Copy of signed Medicare IM left with patient in room. 

## 2018-10-13 NOTE — Progress Notes (Signed)
Patient with c/o pain rating it 6 out of 10. Patient has no PRN pain medication. PRN norco was ordered but previously discont. Patient has had allergic reaction to morphine and prefers not to have dilaudid. Patient states norco was effective. Spoke wih rounding MD who gave RN orders for PRN pain medication. Pills given with sip of water.

## 2018-10-13 NOTE — Plan of Care (Signed)
  Problem: Education: Goal: Knowledge of General Education information will improve Description Including pain rating scale, medication(s)/side effects and non-pharmacologic comfort measures Outcome: Progressing   Problem: Health Behavior/Discharge Planning: Goal: Ability to manage health-related needs will improve Outcome: Progressing   Problem: Clinical Measurements: Goal: Ability to maintain clinical measurements within normal limits will improve Outcome: Progressing Goal: Will remain free from infection Outcome: Progressing Goal: Diagnostic test results will improve Outcome: Progressing Goal: Respiratory complications will improve Outcome: Progressing Goal: Cardiovascular complication will be avoided Outcome: Progressing   Problem: Activity: Goal: Risk for activity intolerance will decrease Outcome: Progressing   Problem: Nutrition: Goal: Adequate nutrition will be maintained Outcome: Progressing   Problem: Elimination: Goal: Will not experience complications related to bowel motility Outcome: Progressing Goal: Will not experience complications related to urinary retention Outcome: Progressing Pt having liquid stool. No nausea.

## 2018-10-13 NOTE — Progress Notes (Addendum)
[place] Hospital Day(s): 6.   Post op day(s): 6 Days Post-Op.   Interval History: Patient seen and examined,   Review of Systems:  Constitutional: denies fever, chills  Respiratory: denies any shortness of breath  Cardiovascular: denies chest pain or palpitations  Gastrointestinal: denies abdominal pain, N/V, or diarrhea/and bowel function as per interval history Genitourinary: denies burning with urination or urinary frequency  Vital signs in last 24 hours: [min-max] current  Temp:  [98.2 F (36.8 C)-98.4 F (36.9 C)] 98.4 F (36.9 C) (01/06 0455) Pulse Rate:  [60-70] 60 (01/06 0455) Resp:  [16-18] 18 (01/06 0455) BP: (146-165)/(62-96) 146/62 (01/06 0455) SpO2:  [95 %-98 %] 98 % (01/06 0455)     Height: 5\' 11"  (180.3 cm) Weight: 78 kg BMI (Calculated): 24   Intake/Output this shift:  No intake/output data recorded.   Intake/Output last 2 shifts:  @IOLAST2SHIFTS @   Physical Exam:  Constitutional: alert, cooperative and no distress  HENT: normocephalic without obvious abnormality  EOM's grossly intact and symmetric  Respiratory: breathing non-labored at rest  Gastrointestinal: soft, non-tender, and non-distended, no peritoneal sign Musculoskeletal: no edema or wounds, motor and sensation grossly intact, NT   Labs:  CBC Latest Ref Rng & Units 10/13/2018 10/12/2018 10/11/2018  WBC 4.0 - 10.5 K/uL 4.5 4.0 3.6(L)  Hemoglobin 13.0 - 17.0 g/dL 12.6(L) 12.8(L) 13.5  Hematocrit 39.0 - 52.0 % 37.0(L) 38.4(L) 39.8  Platelets 150 - 400 K/uL 212 215 233   CMP Latest Ref Rng & Units 10/13/2018 10/12/2018 10/10/2018  Glucose 70 - 99 mg/dL 100(H) 92 99  BUN 8 - 23 mg/dL 12 13 11   Creatinine 0.61 - 1.24 mg/dL 1.04 1.12 1.13  Sodium 135 - 145 mmol/L 140 138 138  Potassium 3.5 - 5.1 mmol/L 3.3(L) 3.2(L) 3.6  Chloride 98 - 111 mmol/L 108 104 103  CO2 22 - 32 mmol/L 25 26 25   Calcium 8.9 - 10.3 mg/dL 9.0 8.9 9.1  Total Protein 6.5 - 8.1 g/dL - - -   Total Bilirubin 0.3 - 1.2 mg/dL - - -  Alkaline Phos 38 - 126 U/L - - -  AST 15 - 41 U/L - - -  ALT 0 - 44 U/L - - -   Imaging studies:  XR Abd on 10/13/18:  1. Pneumatosis. 2. Persistent sigmoid volvulus. 3. Increasing colonic and small bowel obstruction.  Assessment/Plan: (ICD-10's: K3.2) 85 y.o. male with questionable pneumoperitoneum and pneumatosis on radiographic imaging this morning who is otherwise hemodynamically stable and doing well  6 Days Post-Op s/p sigmoidoscopy and decompression for sigmoid volvulus, complicated by pertinent comorbidities including advanced chronological age, chronic constipation, HTN, HLD, sick sinus syndrome s/p permanent pacemaker insertion, chronic therapeutic anticoagulation for chronic atrial fibrillation, osteoarthritis, BPH, and former tobacco abuse (smoking)..   - NPO, IVF   - Pain control as needed  - Monitor abdominal examination  - Will get CT Abdomen/Pelvis to further evaluate XR findings and concern for pneumatosis and pneumoperitoneum   - All risks, benefits, and alternatives to above procedure(s) were discussed with the patient all of his questions were answered to his expressed satisfaction, patient expresses he wishes to proceed, and informed consent was obtained.  - Tentatively plan for exploratory laparotomy and sigmoid colectomy +/- end colostomy creation today.  - Stop heparin drip  All of the above findings and recommendations were discussed with the patient, and the medical team, and all of patient's questions were answered to his expressed satisfaction.  --  Edison Simon, PA-C [facility] 10/13/2018, 10:16 AM [phone] M-F: 7am - 4pm

## 2018-10-13 NOTE — Anesthesia Procedure Notes (Signed)
Procedure Name: Intubation Date/Time: 10/13/2018 3:45 PM Performed by: Lavone Orn, CRNA Pre-anesthesia Checklist: Patient identified, Emergency Drugs available, Suction available, Patient being monitored and Timeout performed Patient Re-evaluated:Patient Re-evaluated prior to induction Oxygen Delivery Method: Circle system utilized Preoxygenation: Pre-oxygenation with 100% oxygen Induction Type: IV induction and Cricoid Pressure applied Ventilation: Mask ventilation without difficulty Laryngoscope Size: Mac and 4 Grade View: Grade I Tube type: Oral Number of attempts: 1 Airway Equipment and Method: Stylet Placement Confirmation: ETT inserted through vocal cords under direct vision,  positive ETCO2 and breath sounds checked- equal and bilateral Secured at: 23 cm Tube secured with: Tape Dental Injury: Teeth and Oropharynx as per pre-operative assessment

## 2018-10-13 NOTE — Anesthesia Preprocedure Evaluation (Signed)
Anesthesia Evaluation  Patient identified by MRN, date of birth, ID band Patient awake    Reviewed: Allergy & Precautions, H&P , NPO status , Patient's Chart, lab work & pertinent test results, reviewed documented beta blocker date and time   Airway Mallampati: II  TM Distance: >3 FB Neck ROM: full    Dental  (+) Poor Dentition   Pulmonary neg pulmonary ROS, former smoker,    Pulmonary exam normal        Cardiovascular Exercise Tolerance: Poor hypertension, On Medications negative cardio ROS Normal cardiovascular exam+ dysrhythmias + pacemaker  Rhythm:regular Rate:Normal     Neuro/Psych PSYCHIATRIC DISORDERS negative neurological ROS  negative psych ROS   GI/Hepatic negative GI ROS, Neg liver ROS, GERD  ,  Endo/Other  negative endocrine ROS  Renal/GU negative Renal ROS  negative genitourinary   Musculoskeletal   Abdominal   Peds  Hematology negative hematology ROS (+)   Anesthesia Other Findings Past Medical History: 12/15: Acute diverticulitis     Comment:  [facility] No date: Acute diverticulitis No date: Allergic rhinitis 12/10: Atrial fibrillation or flutter     Comment:  Has been paroxysmal. Patient has not tolerated coumadin               due to headaches. ECHO (12/10) Mild LVH, EF 55%,  No date: BPH (benign prostatic hyperplasia) 12/10: Chest pain     Comment:  Lexiscan myoview with EF 55%, no ischemia or infarction No date: Dysrhythmia     Comment:  AFIB No date: HOH (hard of hearing) No date: Hyperlipemia No date: Hypertension No date: Osteoarthritis 02/06/2010: Pacemaker  medtronic     Comment:  Qualifier: Diagnosis of  By: Lovena Le, MD, Martyn Malay  No date: Presence of permanent cardiac pacemaker No date: Sick sinus syndrome ([place])     Comment:  S/P dual chamber Medtronic PCM Past Surgical History: 01/16/2017: CATARACT EXTRACTION W/PHACO; Left     Comment:  Procedure: CATARACT  EXTRACTION PHACO AND INTRAOCULAR               LENS PLACEMENT (IOC);  Surgeon: Estill Cotta, MD;                Location: [facility] ORS;  Service: Ophthalmology;  Laterality:              Left;  [place] 1:51.3 AP% 25.9 CDE 54.80 FLUID PACK LOT #               [id number] 09/15/2018: COLONOSCOPY WITH PROPOFOL; N/A     Comment:  Procedure: COLONOSCOPY WITH PROPOFOL;  Surgeon: Lin Landsman, MD;  Location: [facility] ENDOSCOPY;  Service:               Gastroenterology;  Laterality: N/A; 01/11: dual chamber pacemaker     Comment:  Dr. Lovena Le 10/07/2018: FLEXIBLE SIGMOIDOSCOPY; N/A     Comment:  Procedure: FLEXIBLE SIGMOIDOSCOPY;  Surgeon: Virgel Manifold, MD;  Location: [facility] ENDOSCOPY;  Service:               Endoscopy;  Laterality: N/A; 1/14: LAPAROSCOPIC CHOLECYSTECTOMY     Comment:  Dr Bary Castilla in his 79's: VASECTOMY BMI    Body Mass Index:  23.99 kg/m     Reproductive/Obstetrics negative OB  ROS                             Anesthesia Physical Anesthesia Plan  ASA: III and emergent  Anesthesia Plan: General   Post-op Pain Management:    Induction:   PONV Risk Score and Plan:   Airway Management Planned:   Additional Equipment:   Intra-op Plan:   Post-operative Plan:   Informed Consent: I have reviewed the patients History and Physical, chart, labs and discussed the procedure including the risks, benefits and alternatives for the proposed anesthesia with the patient or authorized representative who has indicated his/her understanding and acceptance.   Dental Advisory Given  Plan Discussed with: CRNA  Anesthesia Plan Comments:         Anesthesia Quick Evaluation

## 2018-10-13 NOTE — OR Nursing (Signed)
Pt. Reports he had a milkshake at 12:30 pm yesterday and cream of wheat yesterday for breakfast. Dr. Rosana Hoes is aware of this.

## 2018-10-13 NOTE — Progress Notes (Signed)
Upon arrival to pt's room, during bedside report with pt's nurse on the floor, pt presented with raised area that was firm at top of abdominal incision. No redness or drainage from area. Phone call to notify Dr. Rosana Hoes. Per Dr. Rosana Hoes notify him for increase in size of area, redness or drainage to area. Pt's nurse on floor notified and will contact Dr. Rosana Hoes for any further changes.

## 2018-10-13 NOTE — Consult Note (Addendum)
[facility] for Heparin infusion Indication: atrial fibrillation  Allergies  Allergen Reactions  . Morphine And Related     Shaking, back pain.    Patient Measurements: Height: 5\' 11"  (180.3 cm) Weight: 172 lb (78 kg) IBW/kg (Calculated) : 75.3 Heparin Dosing Weight: 78  Vital Signs: Temp: 98.2 F (36.8 C) (01/05 2055) Temp Source: Oral (01/05 2055) BP: 165/76 (01/05 2055) Pulse Rate: 60 (01/05 2055)  Labs: Recent Labs    10/10/18 1055  10/10/18 1428  10/11/18 0501  10/12/18 0414 10/12/18 0743 10/13/18 0033  HGB  --    < > 13.1  --  13.5  --  12.8*  --  12.6*  HCT  --    < > 38.4*  --  39.8  --  38.4*  --  37.0*  PLT  --    < > 225  --  233  --  215  --  212  APTT  --   --  34  --  156*  --   --   --  42*  LABPROT  --   --  15.2  --   --   --   --   --   --   INR  --   --  1.21  --   --   --   --   --   --   HEPARINUNFRC  --   --   --    < >  --    < > 1.14* 1.13* 0.98*  CREATININE 1.13  --   --   --   --   --  1.12  --  1.04   < > = values in this interval not displayed.    Estimated Creatinine Clearance: 57.3 mL/min (by C-G formula based on SCr of 1.04 mg/dL).   Medical History: Past Medical History:  Diagnosis Date  . Acute diverticulitis 12/15   [facility]  . Acute diverticulitis   . Allergic rhinitis   . Atrial fibrillation or flutter 12/10   Has been paroxysmal. Patient has not tolerated coumadin due to headaches. ECHO (12/10) Mild LVH, EF 55%,   . BPH (benign prostatic hyperplasia)   . Chest pain 12/10   Lexiscan myoview with EF 55%, no ischemia or infarction  . Dysrhythmia    AFIB  . HOH (hard of hearing)   . Hyperlipemia   . Hypertension   . Osteoarthritis   . Pacemaker  medtronic 02/06/2010   Qualifier: Diagnosis of  By: Lovena Le, MD, [facility], Binnie Kand   . Presence of permanent cardiac pacemaker   . Sick sinus syndrome (HCC)    S/P dual chamber Medtronic PCM    Medications:  Medications Prior to Admission   Medication Sig Dispense Refill Last Dose  . ALPRAZolam (XANAX) 0.25 MG tablet TAKE 1 TABLET BY MOUTH TWICE DAILY AS NEEDED FOR ANXIETY 60 tablet 0 Past Month at Unknown time  . apixaban (ELIQUIS) 2.5 MG TABS tablet Take 2.5 mg by mouth 2 (two) times daily.   10/06/2018 at 2000  . ciprofloxacin (CIPRO) 250 MG tablet Take 1 tablet (250 mg total) by mouth 2 (two) times daily. 14 tablet 0 10/06/2018 at 2000  . diltiazem (TIAZAC) 180 MG 24 hr capsule Take 180 mg by mouth daily.   10/06/2018 at 2000  . finasteride (PROSCAR) 5 MG tablet Take 1 tablet (5 mg total) by mouth daily. 90 tablet 3 10/06/2018 at 0800  . flecainide (TAMBOCOR) 100 MG  tablet Take 1 tablet (100 mg total) by mouth 2 (two) times daily. 180 tablet 3 10/06/2018 at 200  . Glucosamine 500 MG CAPS Take by mouth daily.     10/06/2018 at 0800  . ibuprofen (ADVIL,MOTRIN) 200 MG tablet Take 200 mg by mouth every 6 (six) hours as needed for moderate pain.   10/07/2018 at 0400  . metroNIDAZOLE (FLAGYL) 500 MG tablet Take 1 tablet (500 mg total) by mouth 3 (three) times daily. 21 tablet 0 10/06/2018 at 2000  . tamsulosin (FLOMAX) 0.4 MG CAPS capsule TAKE ONE CAPSULE BY MOUTH EVERY DAY 30 capsule 11 10/06/2018 at 2000  . acetaminophen (TYLENOL) 325 MG tablet Take 650 mg by mouth every 6 (six) hours as needed. Reported on 01/17/2016   Not Taking at Unknown time    Assessment: 85 y.o. male with a known history of atrial fibrillation, BPH, hyperlipidemia, hypertension, osteoarthritis who is presenting with constipation for 9 days as well as abdominal pain.  Patient had endoscopic decompression.  Now with possible need for sigmoid colectomy.  Due to this potential need patient had Apixiban held since 1/1 @ 0930 being the last dose.  Patient at present time not wanting to proceed with procedure but understanding that if he clinically deteriorates will need to have this completed.  Therefore the patient will be started on a heparin infusion.  Goal of  Therapy:  Heparin level 0.3-0.7 units/ml Monitor platelets by anticoagulation protocol: Yes   Plan:  01/06 @ 0000 HL 0.98 supratherapeutic. Will decrease rate to 350 units/hr and will recheck HL @ 1000, per RN patient is not bleeding. CBC trending down will continue to monitor. It has been 5 days since last apixaban dose, pt's Scr has been trending normally and improving -- unlikely elevation of anti-Xa level from any remaining apixaban.  Tobie Lords, PharmD, BCPS Clinical Pharmacist 10/13/2018

## 2018-10-13 NOTE — Transfer of Care (Signed)
Immediate Anesthesia Transfer of Care Note  Patient: [name]  Procedure(s) Performed: LAPAROTOMY, LEFT COLECTOMY, (N/A )  Patient Location: PACU  Anesthesia Type:General  Level of Consciousness: awake and confused  Airway & Oxygen Therapy: Patient Spontanous Breathing and Patient connected to face mask oxygen  Post-op Assessment: Report given to RN and Post -op Vital signs reviewed and stable  Post vital signs: Reviewed and stable  Last Vitals:  Vitals Value Taken Time  BP 144/55 10/13/2018  7:43 PM  Temp    Pulse 75 10/13/2018  7:47 PM  Resp 13 10/13/2018  7:47 PM  SpO2 100 % 10/13/2018  7:47 PM  Vitals shown include unvalidated device data.  Last Pain:  Vitals:   10/13/18 1427  TempSrc: Temporal  PainSc: 4          Complications: No apparent anesthesia complications

## 2018-10-13 NOTE — Anesthesia Post-op Follow-up Note (Signed)
Anesthesia QCDR form completed.        

## 2018-10-13 NOTE — Progress Notes (Signed)
[place] at [facility]                                                                                                                                                                                  Patient Demographics   [name], is a 85 y.o. male, DOB - March 11, 1933, UUV:[id number]  Admit date - 10/07/2018   Admitting Physician Dustin Flock, MD  Outpatient Primary MD for the patient is [name], MD   LOS - 6  Subjective: Patient states he had some increased abdominal pain last night, that was relieved with pain meds.  He denies any nausea or vomiting.  His last bowel movement was yesterday morning.  No hematochezia.  Review of Systems:   CONSTITUTIONAL: No documented fever. No fatigue, weakness. No weight gain, no weight loss.  EYES: No blurry or double vision.  ENT: No tinnitus. No postnasal drip. No redness of the oropharynx.  RESPIRATORY: No cough, no wheeze, no hemoptysis. No dyspnea.  CARDIOVASCULAR: No chest pain. No orthopnea. No palpitations. No syncope.  GASTROINTESTINAL: No nausea, no vomiting or diarrhea. +abdominal pain. No melena or hematochezia.  GENITOURINARY: No dysuria or hematuria.  ENDOCRINE: No polyuria or nocturia. No heat or cold intolerance.  HEMATOLOGY: No anemia. No bruising. No bleeding.  INTEGUMENTARY: No rashes. No lesions.  MUSCULOSKELETAL: No arthritis. No swelling. No gout.  NEUROLOGIC: No numbness, tingling, or ataxia. No seizure-type activity.  PSYCHIATRIC: No anxiety. No insomnia. No ADD.   Vitals:   Vitals:   10/12/18 1235 10/12/18 2055 10/13/18 0455 10/13/18 1427  BP: (!) 158/96 (!) 165/76 (!) 146/62 (!) 152/80  Pulse: 70 60 60 61  Resp: 16 17 18 18   Temp: 98.2 F (36.8 C) 98.2 F (36.8 C) 98.4 F (36.9 C) 97.7 F (36.5 C)  TempSrc: Oral Oral Oral Temporal  SpO2: 96% 95% 98% 98%  Weight:      Height:        Wt Readings from Last 3 Encounters:  10/07/18 78 kg  10/06/18 78.5 kg  09/15/18  77.1 kg     Intake/Output Summary (Last 24 hours) at 10/13/2018 1521 Last data filed at 10/13/2018 0900 Gross per 24 hour  Intake 388 ml  Output -  Net 388 ml    Physical Exam:   GENERAL: Well-appearing in no apparent distress.  HEENT: Atraumatic, normocephalic. Extraocular muscles are intact. Pupils equal and reactive to light. Sclerae anicteric. No conjunctival injection.  Moist mucous membranes. NECK: Supple. There is no jugular venous distention. No bruits, no lymphadenopathy, no thyromegaly.  HEART: Regular rate and rhythm,. No murmurs, no rubs, no clicks.  LUNGS: Clear to auscultation bilaterally. No rales  or rhonchi. No wheezes.  ABDOMEN: Soft, flat, nontender.  Mildly distended.  No rebound or guarding. EXTREMITIES: No evidence of any cyanosis, clubbing, or peripheral edema.  +2 pedal and radial pulses bilaterally.  NEUROLOGIC: The patient is alert, awake, and oriented x3 with no focal motor or sensory deficits appreciated bilaterally.  SKIN: Moist and warm with no rashes appreciated.  Psych: Not anxious, depressed   Antibiotics   Anti-infectives (From admission, onward)   Start     Dose/Rate Route Frequency Ordered Stop   10/13/18 1445  cefoTEtan (CEFOTAN) 2 g in sodium chloride 0.9 % 100 mL IVPB     2 g 200 mL/hr over 30 Minutes Intravenous  Once 10/13/18 1443        Medications   Scheduled Meds: . [START ON 10/14/2018] acetaminophen  1,000 mg Oral On Call to OR  . bupivacaine liposome  20 mL Infiltration Once  . Chlorhexidine Gluconate Cloth  6 each Topical Once   And  . Chlorhexidine Gluconate Cloth  6 each Topical Once  . [MAR Hold] diltiazem  180 mg Oral Q2000  . [MAR Hold] docusate sodium  100 mg Oral BID  . [MAR Hold] finasteride  5 mg Oral Daily  . [MAR Hold] flecainide  100 mg Oral BID  . [MAR Hold] potassium chloride  40 mEq Oral Once  . [MAR Hold] sodium phosphate  1 enema Rectal Once  . [MAR Hold] tamsulosin  0.4 mg Oral Q2000   Continuous  Infusions: . cefoTEtan (CEFOTAN) IV    . lactated ringers 100 mL/hr at 10/13/18 1338   PRN Meds:.[MAR Hold] acetaminophen **OR** [MAR Hold] acetaminophen, [MAR Hold] ALPRAZolam, [MAR Hold] HYDROcodone-acetaminophen, [MAR Hold] ondansetron **OR** [MAR Hold] ondansetron (ZOFRAN) IV   Data Review:   Micro Results No results found for this or any previous visit (from the past 240 hour(s)).  Radiology Reports Dg Abd 1 View  Result Date: 10/11/2018 CLINICAL DATA:  Abdominal pain. Sigmoid volvulus. EXAM: ABDOMEN - 1 VIEW COMPARISON:  Radiography 10/08/2018. CT 10/09/2018. FINDINGS: Persistent sigmoid volvulus pattern without evidence of increasing dilatation or visible free air. Gas elsewhere throughout small and large bowel appears similar. No radiographic worsening. IMPRESSION: Persistent sigmoid volvulus pattern without evidence of increasing dilatation or visible free air. Electronically Signed   By: Nelson Chimes M.D.   On: 10/11/2018 07:17   Dg Abd 1 View  Result Date: 10/08/2018 CLINICAL DATA:  Colonic distention. EXAM: ABDOMEN - 1 VIEW COMPARISON:  Radiographs and CT scan of October 07, 2018. FINDINGS: Status post cholecystectomy. Dilated loops of colon are seen on the right side which is slightly improved compared to prior exam. Stool is noted in the more distal colon. No definite small bowel dilatation is noted. IMPRESSION: Slightly improved dilatation of large bowel loops in right side of abdomen compared to prior exam. Electronically Signed   By: Marijo Conception, M.D.   On: 10/08/2018 11:55   Ct Abdomen Pelvis W Contrast  Result Date: 10/13/2018 CLINICAL DATA:  Pneumoperitoneum and pneumatosis imaging this morning. Six days postop sigmoidoscopy and decompression of a sigmoid volvulus. Preop exploratory laparotomy and sigmoid colectomy. EXAM: CT ABDOMEN AND PELVIS WITH CONTRAST TECHNIQUE: Multidetector CT imaging of the abdomen and pelvis was performed using the standard protocol following  bolus administration of intravenous contrast. CONTRAST:  184mL ISOVUE-300 IOPAMIDOL (ISOVUE-300) INJECTION 61% COMPARISON:  Abdominal radiographs 10/13/2018 and CT abdomen pelvis 10/09/2018, 10/05/2014. FINDINGS: Lower chest: Basilar pulmonary nodules measure 4 mm or less in size and are unchanged  from 10/05/2014, rendering them benign. Minimal dependent atelectasis bilaterally. Heart is at the upper limits of normal in size. No pericardial or pleural effusion. Distal esophagus is grossly unremarkable. Hepatobiliary: Well-circumscribed low-attenuation lesions in the liver measure up to 4.9 cm on the left, as before and likely cysts. Cholecystectomy. No biliary ductal dilatation. Pancreas: Negative. Spleen: Negative. Adrenals/Urinary Tract: There may be slight nodular thickening of the body of the right adrenal gland. Adrenal glands and kidneys are otherwise unremarkable. Ureters are decompressed. Bladder is low in volume. Stomach/Bowel: Tiny hiatal hernia. Stomach, small bowel and appendix are decompressed. There is marked dilatation the majority of the colon to the level of the sigmoid, where there is twisting of the sigmoid and its associated mesentery (series 2, images 45-58). Distal rectosigmoid colon is decompressed. Findings are unchanged from 10/09/2018. Vascular/Lymphatic: Atherosclerotic calcification of the aorta without aneurysm. No pathologically enlarged lymph nodes. Reproductive: Prostate is markedly enlarged. Other: Small pelvic free fluid. No free air. Mesenteries and peritoneum are otherwise unremarkable. Musculoskeletal: Degenerative changes in the spine. No worrisome lytic or sclerotic lesions. Large hemangioma in the L1 vertebral body. IMPRESSION: 1. Persistent sigmoid volvulus.  No pneumatosis or pneumoperitoneum. 2. Small pelvic free fluid. 3.  Aortic atherosclerosis (ICD10-170.0). 4. Markedly enlarged prostate. Electronically Signed   By: Lorin Picket M.D.   On: 10/13/2018 11:42   Ct  Abdomen Pelvis W Contrast  Result Date: 10/09/2018 CLINICAL DATA:  Abdominal pain and constipation.  Sigmoid volvulus. EXAM: CT ABDOMEN AND PELVIS WITH CONTRAST TECHNIQUE: Multidetector CT imaging of the abdomen and pelvis was performed using the standard protocol following bolus administration of intravenous contrast. CONTRAST:  134mL OMNIPAQUE IOHEXOL 300 MG/ML  SOLN COMPARISON:  CT scan 10/07/2018 FINDINGS: Lower chest: Streaky bibasilar atelectasis but no infiltrates or effusions. The heart is normal in size for age. No pericardial effusion. Pacer wires are noted. Hepatobiliary: Stable hepatic cysts. No worrisome hepatic lesions. The gallbladder is surgically absent. No common bile duct dilatation. Pancreas: No mass, inflammation or ductal dilatation. Spleen: Normal size.  No focal lesions. Adrenals/Urinary Tract: The adrenal glands and kidneys are normal in stable. The bladder is unremarkable. Stomach/Bowel: The stomach and duodenum are unremarkable. The small bowel appears normal. No acute inflammatory changes, mass lesions or obstructive findings. Large amount of stool involving the ascending, transverse and proximal descending colon. Persistent changes of a sigmoid volvulus with swirling of the mesenteric vessels and the sigmoid colon which is decompressed below the volvulus. Moderate distention of the sigmoid colon above the volvulus which is very tortuous and redundant. No findings for bowel ischemia.  No pneumatosis or free air. Vascular/Lymphatic: Moderate distal aortic and iliac artery calcifications. No aneurysm or dissection. The aortic branch vessels are patent. The major venous structures are patent. No mesenteric or retroperitoneal mass or adenopathy. Reproductive: Markedly enlarged prostate gland. Other: Small amount of free pelvic fluid. Musculoskeletal: No significant bony findings. Left convex lumbar scoliosis with associated degenerative lumbar spondylosis and multilevel disc disease and  facet disease. Stable large hemangioma occupying the L1 vertebral body is along with other smaller hemangiomas. IMPRESSION: 1. Persistent changes of a sigmoid volvulus. No pneumatosis or free air. 2. Stable numerous hepatic cysts. 3. Status post cholecystectomy but no biliary dilatation. Electronically Signed   By: Marijo Sanes M.D.   On: 10/09/2018 09:23   Ct Abdomen Pelvis W Contrast  Result Date: 10/07/2018 CLINICAL DATA:  Acute generalized abdominal pain. EXAM: CT ABDOMEN AND PELVIS WITH CONTRAST TECHNIQUE: Multidetector CT imaging of the abdomen and pelvis  was performed using the standard protocol following bolus administration of intravenous contrast. CONTRAST:  164mL ISOVUE-300 IOPAMIDOL (ISOVUE-300) INJECTION 61% COMPARISON:  CT scan of June 22, 2017. FINDINGS: Lower chest: No acute abnormality. Hepatobiliary: Status post cholecystectomy. No biliary dilatation is noted. Stable appendix cysts are noted. Pancreas: Unremarkable. No pancreatic ductal dilatation or surrounding inflammatory changes. Spleen: Normal in size without focal abnormality. Adrenals/Urinary Tract: Adrenal glands are unremarkable. Kidneys are normal, without renal calculi, focal lesion, or hydronephrosis. Bladder is unremarkable. Stomach/Bowel: The stomach appears normal. The appendix is not visualized. No small bowel dilatation is noted. However, there appears to be a volvulus involving the sigmoid colon with dilatation of the more proximal colon loops. Vascular/Lymphatic: Aortic atherosclerosis. No enlarged abdominal or pelvic lymph nodes. Reproductive: Stable moderate prostatic enlargement is noted. Other: Stable right inguinal hernia is noted which contains a loop of small bowel, but does not result in incarceration or obstruction. Musculoskeletal: Severe multilevel degenerative disc disease is noted in the lumbar spine. No acute osseous abnormality is noted. IMPRESSION: There appears to be twisting of the sigmoid colon  concerning for volvulus resulting in mild dilatation obstruction of the more proximal colon. Sigmoidoscopy is recommended for further evaluation. Critical Value/emergent results were called by telephone at the time of interpretation on 10/07/2018 at 10:58 am to Dr. Arta Silence , who verbally acknowledged these results. Stable moderate prostatic enlargement. Aortic Atherosclerosis (ICD10-I70.0). Electronically Signed   By: Marijo Conception, M.D.   On: 10/07/2018 10:58   Dg Abd 2 Views  Result Date: 10/13/2018 CLINICAL DATA:  Sigmoid volvulus. EXAM: ABDOMEN - 2 VIEW COMPARISON:  Abdominal radiographs 10/11/2018. CT of the abdomen and pelvis 10/09/2018 and 10/07/2018 FINDINGS: Dilated sigmoid colon extending into the right upper quadrant remains. A fluid level is present in the sigmoid loop. There is increasing distention of the remainder of the colon and small bowel. Pneumatosis is now present. Free air is evident under the right hemidiaphragm. IMPRESSION: 1. Pneumatosis. 2. Persistent sigmoid volvulus. 3. Increasing colonic and small bowel obstruction. Critical Value/emergent results were called by telephone at the time of interpretation on 10/13/2018 at 8:19 Am to Dr. Brett Albino , who verbally acknowledged these results. Electronically Signed   By: San Morelle M.D.   On: 10/13/2018 08:20   Dg Abd 2 Views  Result Date: 10/07/2018 CLINICAL DATA:  No bowel movement in 9 days according to the patient, abdominal pain and bloating, history of diverticulitis EXAM: ABDOMEN - 2 VIEW COMPARISON:  CT abdomen pelvis of 06/22/2017 FINDINGS: Both large and small bowel gas is present without significant distension. Slight gaseous distention of the right colon is noted of questionable significance. There is no evidence of bowel obstruction currently. Permanent pacemaker wires remain. No opaque calculus is seen. There are degenerative changes in the lumbar spine. Surgical clips are present in the right upper quadrant  from prior cholecystectomy. IMPRESSION: 1. Slight gaseous distention of the right colon, but no definite evidence of bowel obstruction is noted. 2. Diffuse degenerative change throughout the lumbar spine. Electronically Signed   By: Ivar Drape M.D.   On: 10/07/2018 09:03     CBC Recent Labs  Lab 10/07/18 0946 10/10/18 1428 10/11/18 0501 10/12/18 0414 10/13/18 0033  WBC 5.3 4.8 3.6* 4.0 4.5  HGB 14.2 13.1 13.5 12.8* 12.6*  HCT 41.8 38.4* 39.8 38.4* 37.0*  PLT 243 225 233 215 212  MCV 91.1 92.1 90.5 91.9 90.0  MCH 30.9 31.4 30.7 30.6 30.7  MCHC 34.0 34.1 33.9 33.3 34.1  RDW 12.6 12.5 12.5 12.5 12.5  LYMPHSABS 1.6  --   --   --   --   MONOABS 0.5  --   --   --   --   EOSABS 0.0  --   --   --   --   BASOSABS 0.0  --   --   --   --     Chemistries  Recent Labs  Lab 10/07/18 0946 10/09/18 0502 10/10/18 1055 10/12/18 0414 10/13/18 0033  NA 139 138 138 138 140  K 3.7 3.3* 3.6 3.2* 3.3*  CL 107 108 103 104 108  CO2 23 25 25 26 25   GLUCOSE 103* 91 99 92 100*  BUN 20 14 11 13 12   CREATININE 1.08 0.98 1.13 1.12 1.04  CALCIUM 9.2 8.5* 9.1 8.9 9.0  MG  --   --   --  2.3 2.3  AST 17  --   --   --   --   ALT 11  --   --   --   --   ALKPHOS 57  --   --   --   --   BILITOT 1.1  --   --   --   --    ------------------------------------------------------------------------------------------------------------------ estimated creatinine clearance is 57.3 mL/min (by C-G formula based on SCr of 1.04 mg/dL). ------------------------------------------------------------------------------------------------------------------ No results for input(s): HGBA1C in the last 72 hours. ------------------------------------------------------------------------------------------------------------------ No results for input(s): CHOL, HDL, LDLCALC, TRIG, CHOLHDL, LDLDIRECT in the last 72  hours. ------------------------------------------------------------------------------------------------------------------ No results for input(s): TSH, T4TOTAL, T3FREE, THYROIDAB in the last 72 hours.  Invalid input(s): FREET3 ------------------------------------------------------------------------------------------------------------------ No results for input(s): VITAMINB12, FOLATE, FERRITIN, TIBC, IRON, RETICCTPCT in the last 72 hours.  Coagulation profile Recent Labs  Lab 10/10/18 1428  INR 1.21    No results for input(s): DDIMER in the last 72 hours.  Cardiac Enzymes No results for input(s): CKMB, TROPONINI, MYOGLOBIN in the last 168 hours.  Invalid input(s): CK ------------------------------------------------------------------------------------------------------------------ Invalid input(s): POCBNP    Assessment & Plan   Persistent sigmoid volvulus- s/p endoscopic decompression by GI.  Abdominal x-ray this morning with possible perforation and free air. -Surgery following -Plan for CT abdomen pelvis today and likely exploratory laparotomy with sigmoid colectomy  Hypokalemia- K 3.3. Mag 2.3. -Replete and recheck  Paroxysmal atrial fibrillation-in NSR today -Continue heparin drip -Hold Eliquis due to potential for surgery, will restart on discharge  BPH-stable.  -Continue Proscar and flomax  Anxiety-stable -Continue Xanax  DVT prophylaxis- on heparin drip, SCDs     Code Status Orders  (From admission, onward)         Start     Ordered   10/07/18 1524  Full code  Continuous     10/07/18 1523        Code Status History    This patient has a current code status but no historical code status.    Advance Directive Documentation     Most Recent Value  Type of Advance Directive  Living will, Healthcare Power of Attorney  Pre-existing out of facility DNR order (yellow form or pink MOST form)  -  "MOST" Form in Place?  -     Consults surgery,  gastroenterology  DVT Prophylaxis heparin gtt, SCDs Lab Results  Component Value Date   PLT 212 10/13/2018   Time Spent in minutes   33 minutes. Greater than 50% of time spent in care coordination and counseling patient regarding the condition and plan of care.  Berna Spare   M.D on 10/13/2018 at 3:21 PM  Between 7am to 6pm - Pager - [phone]  After 6pm go to [email or web address] - Proofreader  [employer]   Office  [phone]

## 2018-10-14 ENCOUNTER — Encounter: Payer: Self-pay | Admitting: Surgery

## 2018-10-14 LAB — CBC
HCT: 39.9 % (ref 39.0–52.0)
Hemoglobin: 13.5 g/dL (ref 13.0–17.0)
MCH: 31 pg (ref 26.0–34.0)
MCHC: 33.8 g/dL (ref 30.0–36.0)
MCV: 91.7 fL (ref 80.0–100.0)
Platelets: 208 10*3/uL (ref 150–400)
RBC: 4.35 MIL/uL (ref 4.22–5.81)
RDW: 12.6 % (ref 11.5–15.5)
WBC: 5.2 10*3/uL (ref 4.0–10.5)
nRBC: 0 % (ref 0.0–0.2)

## 2018-10-14 LAB — BASIC METABOLIC PANEL
Anion gap: 10 (ref 5–15)
BUN: 14 mg/dL (ref 8–23)
CO2: 23 mmol/L (ref 22–32)
Calcium: 8.3 mg/dL — ABNORMAL LOW (ref 8.9–10.3)
Chloride: 108 mmol/L (ref 98–111)
Creatinine, Ser: 1.07 mg/dL (ref 0.61–1.24)
GFR calc Af Amer: >60 mL/min (ref >60)
GFR calc non Af Amer: >60 mL/min (ref >60)
Glucose, Bld: 127 mg/dL — ABNORMAL HIGH (ref 70–99)
Potassium: 3.2 mmol/L — ABNORMAL LOW (ref 3.5–5.1)
Sodium: 141 mmol/L (ref 135–145)

## 2018-10-14 LAB — POTASSIUM: Potassium: 3.5 mmol/L (ref 3.5–5.1)

## 2018-10-14 LAB — GLUCOSE, CAPILLARY: Glucose-Capillary: 87 mg/dL (ref 70–99)

## 2018-10-14 MED ORDER — SODIUM CHLORIDE 0.9 % IV SOLN
INTRAVENOUS | Status: DC | PRN
  Administered 2018-10-14 – 2018-10-16 (×3): 250 mL via INTRAVENOUS

## 2018-10-14 MED ORDER — POTASSIUM CHLORIDE 10 MEQ/100ML IV SOLN
10.0000 meq | INTRAVENOUS | Status: AC
  Administered 2018-10-14 (×4): 10 meq via INTRAVENOUS
  Filled 2018-10-14 (×4): qty 100

## 2018-10-14 MED ORDER — BOOST / RESOURCE BREEZE PO LIQD CUSTOM
1.0000 | Freq: Three times a day (TID) | ORAL | Status: DC
  Administered 2018-10-14: 1 via ORAL

## 2018-10-14 MED ORDER — POTASSIUM CHLORIDE CRYS ER 20 MEQ PO TBCR
40.0000 meq | EXTENDED_RELEASE_TABLET | Freq: Once | ORAL | Status: AC
  Administered 2018-10-14: 40 meq via ORAL
  Filled 2018-10-14: qty 2

## 2018-10-14 NOTE — Anesthesia Postprocedure Evaluation (Signed)
Anesthesia Post Note  Patient: [name]  Procedure(s) Performed: LAPAROTOMY, LEFT COLECTOMY, (N/A )  Patient location during evaluation: PACU Anesthesia Type: General Level of consciousness: awake and alert Pain management: pain level controlled Vital Signs Assessment: post-procedure vital signs reviewed and stable Respiratory status: spontaneous breathing, nonlabored ventilation, respiratory function stable and patient connected to nasal cannula oxygen Cardiovascular status: blood pressure returned to baseline and stable Postop Assessment: no apparent nausea or vomiting Anesthetic complications: no     Last Vitals:  Vitals:   10/13/18 2343 10/14/18 0429  BP: (!) 158/82 (!) 157/79  Pulse: 81 70  Resp: 16 18  Temp: 36.7 C 36.8 C  SpO2: 100% 99%    Last Pain:  Vitals:   10/14/18 0429  TempSrc: Oral  PainSc:                  Precious Haws Larron Armor

## 2018-10-14 NOTE — Consult Note (Signed)
PHARMACY CONSULT NOTE - FOLLOW UP  Pharmacy Consult for Electrolyte Monitoring and Replacement   Recent Labs: Potassium (mmol/L)  Date Value  10/14/2018 3.5  10/06/2014 3.6   Magnesium (mg/dL)  Date Value  10/13/2018 2.3   Calcium (mg/dL)  Date Value  10/14/2018 8.3 (L)   Calcium, Total (mg/dL)  Date Value  10/06/2014 8.2 (L)   Albumin (g/dL)  Date Value  10/07/2018 4.2  10/05/2014 3.5   Phosphorus (mg/dL)  Date Value  07/12/2009 4.0   Sodium (mmol/L)  Date Value  10/14/2018 141  11/11/2015 141  10/06/2014 137     Assessment: Pharmacy has been consulted to replenish electrolytes in this patient  Goal of Therapy:  Electrolytes wnl's  Plan:  Current K 3.5 - Will give KCl 40 meq x 1 - will recheck potassium in the AM   Will recheck electrolytes including phos (refeed risk) 1/8 am labs  Oswald Hillock, PharmD, BCPS Clinical Pharmacist 10/14/2018 6:28 PM

## 2018-10-14 NOTE — Progress Notes (Signed)
[place] Hospital Day(s): 7.   Post op day(s): 1 Day Post-Op.   Interval History: Patient seen and examined, no acute events or new complaints overnight. Patient reports that he feels significantly better than he has in a while. He does report some incisional tenderness and soreness but is overall feeling well. No reports of fevers, chills, nausea, or emesis. No reports of flatus this morning. Has been NPO since surgery. He is anxious to get out of bed this morning.   Review of Systems:  Constitutional: denies fever, chills  Respiratory: denies any shortness of breath  Cardiovascular: denies chest pain or palpitations  Gastrointestinal: + abdominal pain (incisional), denied N/V, or diarrhea/and bowel function as per interval history Integumentary: denies any other rashes or skin discolorations except midline laparotomy incision  Vital signs in last 24 hours: [min-max] current  Temp:  [97.5 F (36.4 C)-98.3 F (36.8 C)] 98.3 F (36.8 C) (01/07 0429) Pulse Rate:  [61-81] 70 (01/07 0429) Resp:  [10-20] 18 (01/07 0429) BP: (143-184)/(55-82) 157/79 (01/07 0429) SpO2:  [96 %-100 %] 99 % (01/07 0429) Weight:  [74.6 kg] 74.6 kg (01/07 0335)     Height: 5\' 11"  (180.3 cm) Weight: 74.6 kg BMI (Calculated): 22.95   Intake/Output this shift:  No intake/output data recorded.   Intake/Output last 2 shifts:  @IOLAST2SHIFTS @   Physical Exam:  Constitutional: alert, cooperative and no distress  Respiratory: breathing non-labored at rest  Gastrointestinal: soft, incisional tenderess, and non-distended. 2 JP drains in LLQ with serosanguinous output Integumentary: Midline laparotomy incision is intact, minimal drainage from inferior aspect, no surrounding erythema.   Labs:  CBC Latest Ref Rng & Units 10/14/2018 10/13/2018 10/12/2018  WBC 4.0 - 10.5 K/uL 5.2 4.5 4.0  Hemoglobin 13.0 - 17.0 g/dL 13.5 12.6(L) 12.8(L)  Hematocrit 39.0 - 52.0 % 39.9 37.0(L)  38.4(L)  Platelets 150 - 400 K/uL 208 212 215   CMP Latest Ref Rng & Units 10/14/2018 10/13/2018 10/12/2018  Glucose 70 - 99 mg/dL 127(H) 100(H) 92  BUN 8 - 23 mg/dL 14 12 13   Creatinine 0.61 - 1.24 mg/dL 1.07 1.04 1.12  Sodium 135 - 145 mmol/L 141 140 138  Potassium 3.5 - 5.1 mmol/L 3.2(L) 3.3(L) 3.2(L)  Chloride 98 - 111 mmol/L 108 108 104  CO2 22 - 32 mmol/L 23 25 26   Calcium 8.9 - 10.3 mg/dL 8.3(L) 9.0 8.9  Total Protein 6.5 - 8.1 g/dL - - -  Total Bilirubin 0.3 - 1.2 mg/dL - - -  Alkaline Phos 38 - 126 U/L - - -  AST 15 - 41 U/L - - -  ALT 0 - 44 U/L - - -     Assessment/Plan: (ICD-10's: K94.2) 85 y.o. male with mild hypokalemia who is otherwise doing well 1 Day Post-Op s/p sigmoid colectomy with primary linear anastomosis for recurrent sigmoid volvulus, complicated by pertinent comorbidities including advanced chronological age, chronic constipation, HTN, HLD, sick sinus syndrome s/p permanent pacemaker insertion, chronic therapeutic anticoagulation for chronic atrial fibrillation, osteoarthritis, BPH, and former tobacco abuse (smoking.   - Start on clear liquids + nutritional supplementation, IVF  - Discontinue NGT + Foley today  - Pain control (minimize narcotics), antiemetics  - Continue to monitor abdominal examination and on-going bowel fucntion  - Continue JP Drains (160 ccs out per chart review - serosanguinous)   - Replete K+ and monitor  - Encourage ambulation  - Lovenox for DVT prophylaxis  All of the above findings and recommendations were  discussed with the patient, and the medical team, and all of patient's questions were answered to his expressed satisfaction.  -- Edison Simon, PA-C [facility] 10/14/2018, 7:38 AM [phone] M-F: 7am - 4pm

## 2018-10-14 NOTE — Consult Note (Addendum)
PHARMACY CONSULT NOTE - FOLLOW UP  Pharmacy Consult for Electrolyte Monitoring and Replacement   Recent Labs: Potassium (mmol/L)  Date Value  10/14/2018 3.2 (L)  10/06/2014 3.6   Magnesium (mg/dL)  Date Value  10/13/2018 2.3   Calcium (mg/dL)  Date Value  10/14/2018 8.3 (L)   Calcium, Total (mg/dL)  Date Value  10/06/2014 8.2 (L)   Albumin (g/dL)  Date Value  10/07/2018 4.2  10/05/2014 3.5   Phosphorus (mg/dL)  Date Value  07/12/2009 4.0   Sodium (mmol/L)  Date Value  10/14/2018 141  11/11/2015 141  10/06/2014 137     Assessment: Pharmacy has been consulted to replenish electrolytes in this patient  Goal of Therapy:  Electrolytes wnl's  Plan:  Current K 3.2 - Will give KCl 10 meq IV x 4 - will recheck potassium @ 1800    Will recheck electrolytes including phos (refeed risk) 1/8 am labs  Lu Duffel, PharmD, BCPS Clinical Pharmacist 10/14/2018 10:12 AM

## 2018-10-15 LAB — BASIC METABOLIC PANEL
Anion gap: 6 (ref 5–15)
BUN: 23 mg/dL (ref 8–23)
CO2: 25 mmol/L (ref 22–32)
Calcium: 8.5 mg/dL — ABNORMAL LOW (ref 8.9–10.3)
Chloride: 106 mmol/L (ref 98–111)
Creatinine, Ser: 1.33 mg/dL — ABNORMAL HIGH (ref 0.61–1.24)
GFR calc Af Amer: 57 mL/min — ABNORMAL LOW (ref >60)
GFR calc non Af Amer: 49 mL/min — ABNORMAL LOW (ref >60)
Glucose, Bld: 95 mg/dL (ref 70–99)
POTASSIUM: 3.9 mmol/L (ref 3.5–5.1)
Sodium: 137 mmol/L (ref 135–145)

## 2018-10-15 LAB — SURGICAL PATHOLOGY

## 2018-10-15 LAB — CBC
HCT: 33.7 % — ABNORMAL LOW (ref 39.0–52.0)
Hemoglobin: 11.4 g/dL — ABNORMAL LOW (ref 13.0–17.0)
MCH: 30.9 pg (ref 26.0–34.0)
MCHC: 33.8 g/dL (ref 30.0–36.0)
MCV: 91.3 fL (ref 80.0–100.0)
NRBC: 0 % (ref 0.0–0.2)
Platelets: 165 10*3/uL (ref 150–400)
RBC: 3.69 MIL/uL — AB (ref 4.22–5.81)
RDW: 12.9 % (ref 11.5–15.5)
WBC: 7.1 10*3/uL (ref 4.0–10.5)

## 2018-10-15 LAB — PHOSPHORUS: PHOSPHORUS: 3.5 mg/dL (ref 2.5–4.6)

## 2018-10-15 MED ORDER — ENSURE ENLIVE PO LIQD
237.0000 mL | Freq: Two times a day (BID) | ORAL | Status: DC
  Administered 2018-10-15 (×2): 237 mL via ORAL

## 2018-10-15 MED ORDER — PROMETHAZINE HCL 25 MG/ML IJ SOLN
12.5000 mg | Freq: Once | INTRAMUSCULAR | Status: DC
  Filled 2018-10-15: qty 1

## 2018-10-15 NOTE — Consult Note (Signed)
PHARMACY CONSULT NOTE - FOLLOW UP  Pharmacy Consult for Electrolyte Monitoring and Replacement   Recent Labs: Potassium (mmol/L)  Date Value  10/15/2018 3.9  10/06/2014 3.6   Magnesium (mg/dL)  Date Value  10/13/2018 2.3   Calcium (mg/dL)  Date Value  10/15/2018 8.5 (L)   Calcium, Total (mg/dL)  Date Value  10/06/2014 8.2 (L)   Albumin (g/dL)  Date Value  10/07/2018 4.2  10/05/2014 3.5   Phosphorus (mg/dL)  Date Value  10/15/2018 3.5   Sodium (mmol/L)  Date Value  10/15/2018 137  11/11/2015 141  10/06/2014 137     Assessment: Pharmacy has been consulted to replenish electrolytes in this patient  Goal of Therapy:  Electrolytes wnl's  Plan:  No electrolyte replacement required at this time  Will recheck electrolytes with 1/9 am labs  Dallie Piles, PharmD Clinical Pharmacist 10/15/2018 2:04 PM

## 2018-10-15 NOTE — Progress Notes (Signed)
[place] Hospital Day(s): 8.   Post op day(s): 2 Days Post-Op.   Interval History: Patient seen and examined, no acute events or new complaints overnight. Patient reports that he notices diffuse abdominal soreness but this continues to improve and is better than before the procedure. No complaints of fever, chills, nausea, or emesis. He has tolerated clear liquids but he would like a little more to eat. Denied flatus but did endorse having a BM overnight which was loose. Has been mobilizing.   Review of Systems:  Constitutional: denies fever, chills  Respiratory: denies any shortness of breath  Cardiovascular: denies chest pain or palpitations  Gastrointestinal: + Abdominal soreness, denied N/V, or diarrhea/and bowel function as per interval history Integumentary: denies any other rashes or skin discolorations except midline laparotomy incision  Vital signs in last 24 hours: [min-max] current  Temp:  [98 F (36.7 C)-98.2 F (36.8 C)] 98.2 F (36.8 C) (01/08 0511) Pulse Rate:  [60-65] 60 (01/08 0511) Resp:  [16] 16 (01/08 0511) BP: (121-140)/(58-65) 130/64 (01/08 0511) SpO2:  [97 %] 97 % (01/08 0511)     Height: 5\' 11"  (180.3 cm) Weight: 74.6 kg BMI (Calculated): 22.95   Intake/Output this shift:  Total I/O In: 166.3 [P.O.:60; I.V.:60.4; IV Piggyback:45.9] Out: -    Intake/Output last 2 shifts:  @IOLAST2SHIFTS @   Physical Exam:  Constitutional: alert, cooperative and no distress  Respiratory: breathing non-labored at rest  Gastrointestinal: soft, incisional tenderess, and non-distended. 2 JP drains in LLQ with serosanguinous output Integumentary: Midline laparotomy incision is intact, minimal drainage from inferior aspect, no surrounding erythema.    Labs:  CBC Latest Ref Rng & Units 10/15/2018 10/14/2018 10/13/2018  WBC 4.0 - 10.5 K/uL 7.1 5.2 4.5  Hemoglobin 13.0 - 17.0 g/dL 11.4(L) 13.5 12.6(L)  Hematocrit 39.0 - 52.0 % 33.7(L) 39.9  37.0(L)  Platelets 150 - 400 K/uL 165 208 212   CMP Latest Ref Rng & Units 10/15/2018 10/14/2018 10/14/2018  Glucose 70 - 99 mg/dL 95 - 127(H)  BUN 8 - 23 mg/dL 23 - 14  Creatinine 0.61 - 1.24 mg/dL 1.33(H) - 1.07  Sodium 135 - 145 mmol/L 137 - 141  Potassium 3.5 - 5.1 mmol/L 3.9 3.5 3.2(L)  Chloride 98 - 111 mmol/L 106 - 108  CO2 22 - 32 mmol/L 25 - 23  Calcium 8.9 - 10.3 mg/dL 8.5(L) - 8.3(L)  Total Protein 6.5 - 8.1 g/dL - - -  Total Bilirubin 0.3 - 1.2 mg/dL - - -  Alkaline Phos 38 - 126 U/L - - -  AST 15 - 41 U/L - - -  ALT 0 - 44 U/L - - -     Assessment/Plan: (ICD-10's: K81.2) 85 y.o. male who is doing well 2 Days Post-Op s/p sigmoid colectomy with primary linear anastomosis for recurrent sigmoid volvulus, complicated by pertinent comorbidities including advanced chronological age, chronic constipation, HTN, HLD, sick sinus syndrome s/p permanent pacemaker insertion, chronic therapeutic anticoagulation for chronic atrial fibrillation, osteoarthritis, BPH, and former tobacco abuse (smoking).              - Advance to full liquids (wont advance further until consistent bowel function returns) + nutritional supplementation, wean IVF             - Pain control (minimize narcotics), antiemetics             - Continue to monitor abdominal examination and on-going bowel fucntion             -  Continue JP Drains (59 ccs out per chart review - serosanguinous)              - Encourage continued ambulation             - Lovenox for DVT prophylaxis  All of the above findings and recommendations were discussed with the patient, and the medical team, and all of patient's questions were answered to his expressed satisfaction.  -- Edison Simon, PA-C [facility] 10/15/2018, 9:48 AM [phone] M-F: 7am - 4pm

## 2018-10-16 LAB — CBC
HCT: 31.4 % — ABNORMAL LOW (ref 39.0–52.0)
Hemoglobin: 10.6 g/dL — ABNORMAL LOW (ref 13.0–17.0)
MCH: 31.1 pg (ref 26.0–34.0)
MCHC: 33.8 g/dL (ref 30.0–36.0)
MCV: 92.1 fL (ref 80.0–100.0)
Platelets: 168 10*3/uL (ref 150–400)
RBC: 3.41 MIL/uL — ABNORMAL LOW (ref 4.22–5.81)
RDW: 13 % (ref 11.5–15.5)
WBC: 6.4 10*3/uL (ref 4.0–10.5)
nRBC: 0 % (ref 0.0–0.2)

## 2018-10-16 LAB — BASIC METABOLIC PANEL
Anion gap: 5 (ref 5–15)
BUN: 20 mg/dL (ref 8–23)
CO2: 25 mmol/L (ref 22–32)
Calcium: 8.5 mg/dL — ABNORMAL LOW (ref 8.9–10.3)
Chloride: 106 mmol/L (ref 98–111)
Creatinine, Ser: 1.18 mg/dL (ref 0.61–1.24)
GFR calc Af Amer: >60 mL/min (ref >60)
GFR calc non Af Amer: 57 mL/min — ABNORMAL LOW (ref >60)
Glucose, Bld: 82 mg/dL (ref 70–99)
POTASSIUM: 3.7 mmol/L (ref 3.5–5.1)
Sodium: 136 mmol/L (ref 135–145)

## 2018-10-16 MED ORDER — AMOXICILLIN-POT CLAVULANATE 875-125 MG PO TABS: 1.0000 | ORAL_TABLET | Freq: Two times a day (BID) | ORAL | 0 refills | Status: AC

## 2018-10-16 MED ORDER — OXYCODONE HCL 5 MG PO TABS: 5.0000 mg | ORAL_TABLET | ORAL | 0 refills | Status: DC | PRN

## 2018-10-16 NOTE — Discharge Instructions (Signed)
In addition to included general post-operative instructions for sigmoid colectomy,  Diet: Resume home diet.   Activity: No heavy lifting >20 pounds (children, pets, laundry, garbage) or strenuous activity until follow-up, but light activity and walking are encouraged. Do not drive or drink alcohol if taking narcotic pain medications.  Wound care: You may shower/get incision wet with soapy water and pat dry (do not rub incisions), but no baths or submerging incision underwater until follow-up.   Medications: Resume all home medications. For mild to moderate pain: acetaminophen (Tylenol) or ibuprofen/naproxen (if no kidney disease). Combining Tylenol with alcohol can substantially increase your risk of causing liver disease. Narcotic pain medications, if prescribed, can be used for severe pain, though may cause nausea, constipation, and drowsiness. Do not combine Tylenol and Percocet (or similar) within a 6 hour period as Percocet (and similar) contain(s) Tylenol. If you do not need the narcotic pain medication, you do not need to fill the prescription.  Call office [phone]) at any time if any questions, worsening pain, fevers/chills, bleeding, drainage from incision site, or other concerns.

## 2018-10-16 NOTE — Discharge Summary (Addendum)
Patient seen and examined as described below with surgical PA-C, Ardell Isaacs.  I have personally reviewed the patient's chart, evaluated/examined the patient, proposed the recommended management, and discussed these recommendations with the patient to patient's expressed satisfaction as well as with patient's RN.  -- Marilynne Drivers. Rosana Hoes, MD, [place]: [facility] - Partnering for exceptional care. Office: [phone]  [facility] SURGICAL DISCHARGE SUMMARY   Patient ID: [name] MRN: [id number] DOB/AGE: [birth year] 85 y.o.  Admit date: 10/07/2018 Discharge date: 10/16/2018  Discharge Diagnoses Patient Active Problem List   Diagnosis Date Noted  . Abdominal pain 10/07/2018  . Sigmoid volvulus ([place])   . Adenoma of colon   . Visual distortion 07/14/2018  . Tick bite of left lower leg 04/14/2018  . Upper abdominal pain 09/13/2017  . Dark urine 07/07/2017  . Mood disorder ([place]) 12/03/2016  . Chronic constipation 01/10/2015  . Acute diverticulitis   . Counseling regarding advanced directives 04/23/2014  . Routine general medical examination at a health care facility 04/23/2014  . Osteoarthritis of left shoulder 10/21/2013  . GERD (gastroesophageal reflux disease) 02/09/2013  . ACTINIC KERATOSIS 05/04/2010  . Pacemaker  medtronic 02/06/2010  . Sick sinus syndrome ([place]) 10/27/2009  . Atrial fibrillation ([place]) 10/06/2009  . HEPATIC CYST 10/06/2009  . Hyperlipemia 07/12/2009  . Essential hypertension, benign 07/12/2009  . ALLERGIC RHINITIS 07/12/2009  . BPH with obstruction/lower urinary tract symptoms 07/12/2009  . OSTEOARTHRITIS 07/12/2009    Consultants Medicine  Procedures Sigmoid and partial descending colectomy with primary linear stapled anastomosis  HPI: 85 y.o. male presented to [facility] ED today for evaluation of possible constipation. Patient reports that he has been constipated for about 9 days now without  any bowel movement. In addition to lack of bowel movement, he endorses associated intermittent abdominal pain which is cramping in nature as well as abdominal distension. He denied any associated fevers, chills, chest pain, or shortness of breath, nausea, or emesis. He did go to his PCP and was prescribed ABx for presumed diverticulitis and had also tried Miralax and mag citrate without improvement. Of note, he did undergo colonoscopy on 12/9 with Dr Marius Ditch which showed non-bleeding hemorrhoids and multiple polyps. Work up in the ED was concerning for sigmoid volvulus.   Hospital Course: Patient underwent sigmoidoscopy and decompression on day of presentation with Dr Bonna Gains. In the post-procedure period he felt improved. Clinically, he continued to improve however imaging was continued to be concerning for persistent volvulus although he was passing some flatus and having liquid stools. His constipation was managed with bowel regimen. On 01/06, his KUB was concerning for pneumoperitoneum however this was ruled out with subsequent CT. However, he did elect to undergo sigmoid colectomy on 01/06. Informed consent was obtained and documented, and patient underwent uneventful sigmoid colectomy (Dr Rosana Hoes, 10/13/2018).  Post-operatively, patient's  Pain improved/resolved and advancement of patient's diet and ambulation were well-tolerated. The remainder of patient's hospital course was essentially unremarkable, and discharge planning was initiated accordingly with patient safely able to be discharged home with appropriate discharge instructions, antibiotics, pain control, and outpatient follow-up after all of his and his family's questions were answered to their expressed satisfaction.  Discharge Condition: Good   Physical Examination:  Constitutional: Well appearing male, NAD Pulmonary: Normal effort, no respiratory disctress Gastrointestinal: soft,incisional tenderess, and non-distended. 2 JP drains in LLQ  with serosanguinous output Integumentary:Midline laparotomy incision is intact, minimal drainage from inferior aspect, no surrounding erythema.   Allergies as of  10/16/2018      Reactions   Morphine And Related    Shaking, back pain.      Medication List    TAKE these medications   acetaminophen 325 MG tablet Commonly known as:  TYLENOL Take 650 mg by mouth every 6 (six) hours as needed. Reported on 01/17/2016   ALPRAZolam 0.25 MG tablet Commonly known as:  XANAX TAKE 1 TABLET BY MOUTH TWICE DAILY AS NEEDED FOR ANXIETY   amoxicillin-clavulanate 875-125 MG tablet Commonly known as:  AUGMENTIN Take 1 tablet by mouth 2 (two) times daily for 7 days.   ciprofloxacin 250 MG tablet Commonly known as:  CIPRO Take 1 tablet (250 mg total) by mouth 2 (two) times daily.   diltiazem 180 MG 24 hr capsule Commonly known as:  TIAZAC Take 180 mg by mouth daily.   ELIQUIS 2.5 MG Tabs tablet Generic drug:  apixaban Take 2.5 mg by mouth 2 (two) times daily.   finasteride 5 MG tablet Commonly known as:  PROSCAR Take 1 tablet (5 mg total) by mouth daily.   flecainide 100 MG tablet Commonly known as:  TAMBOCOR Take 1 tablet (100 mg total) by mouth 2 (two) times daily.   Glucosamine 500 MG Caps Take by mouth daily.   ibuprofen 200 MG tablet Commonly known as:  ADVIL,MOTRIN Take 200 mg by mouth every 6 (six) hours as needed for moderate pain.   metroNIDAZOLE 500 MG tablet Commonly known as:  FLAGYL Take 1 tablet (500 mg total) by mouth 3 (three) times daily.   oxyCODONE 5 MG immediate release tablet Commonly known as:  Oxy IR/ROXICODONE Take 1 tablet (5 mg total) by mouth every 4 (four) hours as needed for severe pain.   tamsulosin 0.4 MG Caps capsule Commonly known as:  FLOMAX TAKE ONE CAPSULE BY MOUTH EVERY DAY        Follow-up Information    Vickie Epley, MD. Schedule an appointment as soon as possible for a visit in 1 week(s).   Specialty:  General Surgery Why:   s/p sigmoid colectomy for sigmoid volvulus on 01/06 Contact information: [address] [place] [address] [phone]            -- Edison Simon , PA-C [facility]  10/16/2018, 2:51 PM [phone] M-F: 7am - 4pm

## 2018-10-16 NOTE — Progress Notes (Signed)
Pt had some nausea when ate a full liquid diet. zofran given pr prev nurse. Pt still nausea and received orders to give phenergan but pt refused.

## 2018-10-16 NOTE — Progress Notes (Signed)
Notified dr. Lysle Pearl of pt stating at least 10 stools tonight all watery. Acknowledged.

## 2018-10-16 NOTE — Care Management Important Message (Signed)
Copy of signed Medicare IM left with patient in room. 

## 2018-10-16 NOTE — Progress Notes (Signed)
Discharge instructions reviewed with patient including followup visits, drain care and management and new medications.  Understanding was verbalized and all questions were answered.  IV removed without complication; patient tolerated well.  Patient discharged home via wheelchair in stable condition escorted by volunteer staff.

## 2018-10-16 NOTE — Consult Note (Signed)
PHARMACY CONSULT NOTE - FOLLOW UP  Pharmacy Consult for Electrolyte Monitoring and Replacement   Recent Labs: Potassium (mmol/L)  Date Value  10/16/2018 3.7  10/06/2014 3.6   Magnesium (mg/dL)  Date Value  10/13/2018 2.3   Calcium (mg/dL)  Date Value  10/16/2018 8.5 (L)   Calcium, Total (mg/dL)  Date Value  10/06/2014 8.2 (L)   Albumin (g/dL)  Date Value  10/07/2018 4.2  10/05/2014 3.5   Phosphorus (mg/dL)  Date Value  10/15/2018 3.5   Sodium (mmol/L)  Date Value  10/16/2018 136  11/11/2015 141  10/06/2014 137     Assessment: Pharmacy has been consulted to replenish electrolytes in this patient  Goal of Therapy:  Electrolytes wnl's  Plan:  No electrolyte replacement required at this time  Will recheck electrolytes with 1/10 am labs  Dallie Piles, PharmD Clinical Pharmacist 10/16/2018 11:47 AM

## 2018-10-17 ENCOUNTER — Telehealth: Payer: Self-pay

## 2018-10-17 NOTE — Telephone Encounter (Signed)
Glad to hear he is still doing okay Will just have him follow up with the surgeon, unless something else happens

## 2018-10-17 NOTE — Telephone Encounter (Signed)
Called patient to follow up after his recent hospital stay. Patient said he spoke with Dr. Silvio Pate already and said thank you. He is doing ok. Advised patient if he needs anything we are here for him.

## 2018-10-20 ENCOUNTER — Telehealth: Payer: Self-pay | Admitting: Surgery

## 2018-10-20 NOTE — Telephone Encounter (Signed)
Patient has called with concerns.  Patient had surgery on 10/13/18-Dr Rosana Hoes with [employer] assisting-Sigmoid and partial descending colectomy. Patient was discharged on 10/16/18 and has a post op appointment tomorrow 10/21/18.  Patient states that he started yesterday with black runny stool. Once yesterday and twice today. He stated that he did have a black BM before being released from the hospital and he was informed that it was normal. He has no fever, some occasional nausea-"nothing worrisome-per patient. He is eating a normal diet since being discharged. Last night he had roast beef with mash potatoes and veggies. No change in medication other than the antibiotic he was given when discharged. He has noticed a rash on his back that just came up a couple days ago. He states that it's small bumps that is itchy and when he scratches the rash, it starts to burn.   All drains are draining at this time. Please call the patient at [phone].

## 2018-10-20 NOTE — Telephone Encounter (Signed)
Patient is coming to see Dr,. Rosana Hoes tomorrow

## 2018-10-21 ENCOUNTER — Ambulatory Visit (INDEPENDENT_AMBULATORY_CARE_PROVIDER_SITE_OTHER): Payer: Medicare Other | Admitting: Surgery

## 2018-10-21 ENCOUNTER — Other Ambulatory Visit
Admission: RE | Admit: 2018-10-21 | Discharge: 2018-10-21 | Disposition: A | Discharge status: Home / Self Care | Payer: Medicare Other | Source: Ambulatory Visit | Attending: Surgery | Admitting: Surgery

## 2018-10-21 ENCOUNTER — Encounter: Payer: Self-pay | Admitting: Surgery

## 2018-10-21 ENCOUNTER — Other Ambulatory Visit: Payer: Self-pay

## 2018-10-21 VITALS — BP 139/83 | HR 77 | Temp 97.9°F | Resp 14 | Ht 71.0 in | Wt 168.0 lb

## 2018-10-21 DIAGNOSIS — Z09 Encounter for follow-up examination after completed treatment for conditions other than malignant neoplasm: Secondary | ICD-10-CM

## 2018-10-21 DIAGNOSIS — R195 Other fecal abnormalities: Secondary | ICD-10-CM

## 2018-10-21 DIAGNOSIS — K562 Volvulus: Secondary | ICD-10-CM

## 2018-10-21 LAB — POC HEMOCCULT BLD/STL (OFFICE/1-CARD/DIAGNOSTIC)
Card #1 Date: POSITIVE
Fecal Occult Blood, POC: POSITIVE — AB

## 2018-10-21 LAB — HEMOGLOBIN AND HEMATOCRIT, BLOOD
HCT: 37.1 % — ABNORMAL LOW (ref 39.0–52.0)
HEMOGLOBIN: 12.4 g/dL — AB (ref 13.0–17.0)

## 2018-10-21 MED ORDER — CIPROFLOXACIN HCL 500 MG PO TABS: 500.0000 mg | ORAL_TABLET | Freq: Two times a day (BID) | ORAL | 0 refills | Status: AC

## 2018-10-21 MED ORDER — METRONIDAZOLE 500 MG PO TABS: 500.0000 mg | ORAL_TABLET | Freq: Three times a day (TID) | ORAL | 0 refills | Status: AC

## 2018-10-21 NOTE — Progress Notes (Signed)
10/21/2018  HPI: [name] is a 85 y.o. male s/p exploratory laparotomy and sigmoidectomy with Dr. Rosana Hoes for sigmoid volvulus on 10/13/18.  He was discharged from the hospital on 1/9.  He had two Blake drains in place, with a course of Augmentin that he's been taking at home.  He reports a few issues going on: 1.  Rash on his back that is itching that he reports started after starting the Augmentin 2.  Weakness and fatigue 3.  Dark black stools.  He reports that he started having bowel movements three days ago and each day has gone once daily.  They have been runny and dark/black colored.  No bright red blood on the toilet or toilet paper.  He is eating well and has good appetite, but does get some abdominal discomfort after eating each time.  No severe pain though.  His drains are working but is having some leakage of fluid around the inferior drain.  Vital signs: BP 139/83   Pulse 77   Temp 97.9 F (36.6 C) (Skin)   Resp 14   Ht 5\' 11"  (1.803 m)   Wt 168 lb (76.2 kg)   SpO2 94%   BMI 23.43 kg/m    Physical Exam: Constitutional: No acute distress Abdomen:  Soft, non-distended, non-tender to palpation.  Midline incision is clean, dry, intact, with mild ecchymosis around the incision which appears to be resolving.  Two left sided Blake drains with serosanguinous fluid.  Had some clogs in the drain which were stripped and now working well. Rectal:  Patient has external hemorrhoids which are not tender or thrombosed.  Hemoccult test was positive for blood, but no gross blood on glove.  No masses palpable on rectal exam.  Assessment/Plan: This is a 85 y.o. male s/p exlap and sigmoidectomy.  1.  Both drains are still having more than 30 cc/day output each so both drains will remain in place.   2.  Given the dark blood and hemoccult positive, will check a hemoglobin / hematocrit to make sure he does not need a blood transfusion.  If anemic, this could be contributing to his fatigue and  weakness, though just his surgery alone could be the reason for that.  We'll inform him of the results. 3.  Will stop his Augmentin in case he does have a new allergic reaction to it and start Cipro and Flagyl for 3 more days which is the course he has left.  4.  Will also start Prilosec daily in case he may be developing an ulcer given his abdominal discomfort after eating and the dark blood in the stool.  I am less concerned for bleeding coming from the anastomosis as it is fairly distal and should be bright red instead of dark black.  He had a colonoscopy on 12/9 with Dr. Marius Ditch and multiple polyps were removed.  He has an appointment with Dr. Marius Ditch on 1/17, so we'll send her a note as well to update her. 5.  Follow up in 1 week with Dr. Rosana Hoes for drain/staple removal.   [name], MD [facility]

## 2018-10-21 NOTE — Patient Instructions (Addendum)
Stop Augmentin and start new antibiotics sent to pharmacy for 3 days Start Prilosec over the counter once daily Continue drain care and recording output Follow up in one week with [name] Have labs drawn Keep appointment with [name]   GENERAL POST-OPERATIVE PATIENT INSTRUCTIONS   WOUND CARE INSTRUCTIONS:  Keep a dry clean dressing on the wound if there is drainage. The initial bandage may be removed after 24 hours.  Once the wound has quit draining you may leave it open to air.  If clothing rubs against the wound or causes irritation and the wound is not draining you may cover it with a dry dressing during the daytime.  Try to keep the wound dry and avoid ointments on the wound unless directed to do so.  If the wound becomes bright red and painful or starts to drain infected material that is not clear, please contact your physician immediately.  If the wound is mildly pink and has a thick firm ridge underneath it, this is normal, and is referred to as a healing ridge.  This will resolve over the next 4-6 weeks.  BATHING: You may shower if you have been informed of this by your surgeon. However, Please do not submerge in a tub, hot tub, or pool until incisions are completely sealed or have been told by your surgeon that you may do so.  DIET:  You may eat any foods that you can tolerate.  It is a good idea to eat a high fiber diet and take in plenty of fluids to prevent constipation.  If you do become constipated you may want to take a mild laxative or take ducolax tablets on a daily basis until your bowel habits are regular.  Constipation can be very uncomfortable, along with straining, after recent surgery.  ACTIVITY:  You are encouraged to cough and deep breath or use your incentive spirometer if you were given one, every 15-30 minutes when awake.  This will help prevent respiratory complications and low grade fevers post-operatively if you had a general anesthetic.  You may want to hug a  pillow when coughing and sneezing to add additional support to the surgical area, if you had abdominal or chest surgery, which will decrease pain during these times.  You are encouraged to walk and engage in light activity for the next two weeks.  You should not lift more than 20 pounds for 4 more weeks.  Twenty pounds is roughly equivalent to a plastic bag of groceries. At that time- Listen to your body when lifting, if you have pain when lifting, stop and then try again in a few days. Soreness after doing exercises or activities of daily living is normal as you get back in to your normal routine.  MEDICATIONS:  Try to take narcotic medications and anti-inflammatory medications, such as tylenol, ibuprofen, naprosyn, etc., with food.  This will minimize stomach upset from the medication.  Should you develop nausea and vomiting from the pain medication, or develop a rash, please discontinue the medication and contact your physician.  You should not drive, make important decisions, or operate machinery when taking narcotic pain medication.  SUNBLOCK Use sun block to incision area over the next year if this area will be exposed to sun. This helps decrease scarring and will allow you avoid a permanent darkened area over your incision.  QUESTIONS:  Please feel free to call our office if you have any questions, and we will be glad to assist you.

## 2018-10-24 ENCOUNTER — Ambulatory Visit (INDEPENDENT_AMBULATORY_CARE_PROVIDER_SITE_OTHER): Payer: Medicare Other | Admitting: Gastroenterology

## 2018-10-24 ENCOUNTER — Encounter: Payer: Self-pay | Admitting: Gastroenterology

## 2018-10-24 ENCOUNTER — Telehealth: Payer: Self-pay

## 2018-10-24 VITALS — BP 143/71 | HR 77 | Ht 71.0 in | Wt 166.0 lb

## 2018-10-24 DIAGNOSIS — R195 Other fecal abnormalities: Secondary | ICD-10-CM | POA: Diagnosis not present

## 2018-10-24 NOTE — Progress Notes (Signed)
adem  Cephas Darby, MD [place]  [place], [place] [address]  Main: [phone]  Fax: [phone]    Gastroenterology Consultation  Referring Provider:     [name], MD Primary Care Physician:  [name], MD Primary Gastroenterologist:  Dr. Cephas Darby Reason for Consultation: Dark stool        HPI:   [name] is a 85 y.o. Caucasian male referred by Dr. [name], MD  for consultation & management of chronic rectal discharge.  Patient has history of A. fib, pacemaker on Eliquis, very active, functional independent who has been experiencing drainage of clear mucus per rectum. He has been suffering from constipation for several years, takes laxatives as needed.  Bowel movements are infrequent, every 2 to 3 days, generally hard associated with straining.  He tries to incorporate more fiber in his diet, has tried fiber supplements as well as stool softeners with not much relief.  He he has been noticing only clear mucus drainage per rectum.  Denies any fecal incontinence, diarrhea or rectal bleeding.  His weight has been stable.  He also reports intermittent bloating He is status post cholecystectomy in 2014  Follow-up visit 10/24/2018 After the colonoscopy, 3 weeks later patient developed sigmoid volvulus for which he was admitted to [facility].  He underwent flexible sigmoidoscopy with decompression on 10/07/2018.  2 days later, patient had persistent pain and CT revealed persistent changes of his sigmoid volvulus on 1/2 and 1/6 scans.  He underwent sigmoid colectomy, 39 cm of the sigmoid colon removed by Dr. Rosana Hoes and Dr. Hampton Abbot with primary anastomosis and JP drain placement.  Patient had uncomplicated postop recovery.  Patient was seen by Dr. Hampton Abbot as a surgery follow-up on 10/21/2018.  He reported dark stool, soft in consistency without any blood mixed with it.  Patient reported that he started noticing that stools are darker since postop  day 1.  Hemoglobin at Dr. Mont Dutton office was 12.4 improved from 10.6 during hospital stay.  Patient was started on over-the-counter Prilosec due to possibility of stress ulcers that have been causing dark stool.  Patient is here to discuss about the ongoing dark stool.  He reports doing well overall, recovering, denies any fatigue, good appetite, denies epigastric pain, reports 2-3 soft dark stools daily but denies dark red or bright red blood or black tarry stool He is taking Eliquis daily.  NSAIDs: None  Antiplts/Anticoagulants/Anti thrombotics: Eliquis with history of A. fib  GI Procedures:  Colonoscopy 09/15/2018 - Hemorrhoids found on perianal exam. - One diminutive polyp in the cecum, removed with a cold biopsy forceps. Resected and retrieved. - Two 5 mm polyps in the transverse colon, removed with a cold snare. Resected and retrieved. - One 5 mm polyp in the ascending colon, removed with a cold snare. Resected and retrieved. - One 5 mm polyp in the descending colon, removed with a cold snare. Resected and retrieved. - One 7 mm polyp in the rectum, removed with a hot snare. Resected and retrieved. - Non-bleeding external and internal hemorrhoids.  DIAGNOSIS:  A. COLON POLYP, CECUM; BIOPSY:  - 1 TUBULAR ADENOMA AND A FRAGMENT OF BENIGN COLONIC MUCOSA.  - MELANOSIS COLI.  - NEGATIVE FOR HIGH-GRADE DYSPLASIA AND MALIGNANCY.   B. COLON POLYP X2, TRANSVERSE; BIOPSY:  - TUBULAR ADENOMA (2 FRAGMENTS).  - NEGATIVE FOR HIGH-GRADE DYSPLASIA AND MALIGNANCY.   C. COLON POLYP, ASCENDING; BIOPSY:  - SESSILE SERRATED POLYP (3 FRAGMENTS).  -  MELANOSIS COLI.  -NEGATIVE FOR HIGH-GRADE DYSPLASIA AND MALIGNANCY.   D. COLON POLYP, DESCENDING; BIOPSY:  - FECAL MATERIAL ONLY.   E. RECTUM, POLYP; BIOPSY:  - TUBULAR ADENOMA.  - NEGATIVE FOR HIGH-GRADE DYSPLASIA AND MALIGNANCY.   Colonoscopy in 2006, tubular adenoma COLON, POLYP(S): ADENOMATOUS POLYP(S). NO HIGH GRADE DYSPLASIA OR INVASIVE  MALIGNANCY IDENTIFIED.  COMMENT There is adenoma with nuclear stratification and decreased mucin. High grade dysplasia or invasive malignancy are not identified.  Past Medical History:  Diagnosis Date  . Acute diverticulitis 12/15   [facility]  . Acute diverticulitis   . Allergic rhinitis   . Atrial fibrillation or flutter 12/10   Has been paroxysmal. Patient has not tolerated coumadin due to headaches. ECHO (12/10) Mild LVH, EF 55%,   . BPH (benign prostatic hyperplasia)   . Chest pain 12/10   Lexiscan myoview with EF 55%, no ischemia or infarction  . Dysrhythmia    AFIB  . HOH (hard of hearing)   . Hyperlipemia   . Hypertension   . Osteoarthritis   . Pacemaker  medtronic 02/06/2010   Qualifier: Diagnosis of  By: Lovena Le, MD, [facility], Binnie Kand   . Presence of permanent cardiac pacemaker   . Sick sinus syndrome (HCC)    S/P dual chamber Medtronic PCM    Past Surgical History:  Procedure Laterality Date  . CATARACT EXTRACTION W/PHACO Left 01/16/2017   Procedure: CATARACT EXTRACTION PHACO AND INTRAOCULAR LENS PLACEMENT (IOC);  Surgeon: Estill Cotta, MD;  Location: [facility] ORS;  Service: Ophthalmology;  Laterality: Left;  [place] 1:51.3 AP% 25.9 CDE 54.80 FLUID PACK LOT # [id number]  . COLONOSCOPY WITH PROPOFOL N/A 09/15/2018   Procedure: COLONOSCOPY WITH PROPOFOL;  Surgeon: Lin Landsman, MD;  Location: [facility] ENDOSCOPY;  Service: Gastroenterology;  Laterality: N/A;  . dual chamber pacemaker  01/11   Dr. Lovena Le  . FLEXIBLE SIGMOIDOSCOPY N/A 10/07/2018   Procedure: FLEXIBLE SIGMOIDOSCOPY;  Surgeon: Virgel Manifold, MD;  Location: [facility] ENDOSCOPY;  Service: Endoscopy;  Laterality: N/A;  . LAPAROSCOPIC CHOLECYSTECTOMY  1/14   Dr Bary Castilla  . PARTIAL COLECTOMY N/A 10/13/2018   Procedure: LAPAROTOMY, LEFT COLECTOMY,;  Surgeon: Vickie Epley, MD;  Location: [facility] ORS;  Service: General;  Laterality: N/A;  . VASECTOMY  in his 34's    Current Outpatient Medications:  .   acetaminophen (TYLENOL) 325 MG tablet, Take 650 mg by mouth every 6 (six) hours as needed. Reported on 01/17/2016, Disp: , Rfl:  .  ALPRAZolam (XANAX) 0.25 MG tablet, TAKE 1 TABLET BY MOUTH TWICE DAILY AS NEEDED FOR ANXIETY, Disp: 60 tablet, Rfl: 0 .  apixaban (ELIQUIS) 2.5 MG TABS tablet, Take 2.5 mg by mouth 2 (two) times daily., Disp: , Rfl:  .  ciprofloxacin (CIPRO) 500 MG tablet, Take 1 tablet (500 mg total) by mouth 2 (two) times daily for 3 days., Disp: 6 tablet, Rfl: 0 .  diltiazem (TIAZAC) 180 MG 24 hr capsule, Take 180 mg by mouth daily., Disp: , Rfl:  .  finasteride (PROSCAR) 5 MG tablet, Take 1 tablet (5 mg total) by mouth daily., Disp: 90 tablet, Rfl: 3 .  flecainide (TAMBOCOR) 100 MG tablet, Take 1 tablet (100 mg total) by mouth 2 (two) times daily., Disp: 180 tablet, Rfl: 3 .  Glucosamine 500 MG CAPS, Take by mouth daily.  , Disp: , Rfl:  .  ibuprofen (ADVIL,MOTRIN) 200 MG tablet, Take 200 mg by mouth every 6 (six) hours as needed for moderate pain., Disp: , Rfl:  .  metroNIDAZOLE (  FLAGYL) 500 MG tablet, Take 1 tablet (500 mg total) by mouth 3 (three) times daily for 3 days., Disp: 9 tablet, Rfl: 0 .  oxyCODONE (OXY IR/ROXICODONE) 5 MG immediate release tablet, Take 1 tablet (5 mg total) by mouth every 4 (four) hours as needed for severe pain., Disp: 30 tablet, Rfl: 0 .  ranitidine (ZANTAC) 150 MG tablet, Take 150 mg by mouth as needed for heartburn., Disp: , Rfl:  .  tamsulosin (FLOMAX) 0.4 MG CAPS capsule, TAKE ONE CAPSULE BY MOUTH EVERY DAY, Disp: 30 capsule, Rfl: 11    Family History  Problem Relation Age of Onset  . Hypertension Mother   . Alcohol abuse Father   . Bladder Cancer Neg Hx   . Prostate cancer Neg Hx   . Kidney cancer Neg Hx      Social History   Tobacco Use  . Smoking status: Former Research scientist (life sciences)  . Smokeless tobacco: Never Used  . Tobacco comment: In [place], quit in 1960's  Substance Use Topics  . Alcohol use: No  . Drug use: No    Allergies as of  10/24/2018 - Review Complete 10/24/2018  Allergen Reaction Noted  . Augmentin [amoxicillin-pot clavulanate] Rash 10/21/2018  . Morphine and related  10/09/2018    Review of Systems:    All systems reviewed and negative except where noted in HPI.   Physical Exam:  BP (!) 143/71   Pulse 77   Ht 5\' 11"  (1.803 m)   Wt 166 lb (75.3 kg)   BMI 23.15 kg/m  No LMP for male patient.  General:   Alert,  Well-developed, well-nourished, pleasant and cooperative in NAD Head:  Normocephalic and atraumatic. Eyes:  Sclera clear, no icterus.   Conjunctiva pink. Ears:  Normal auditory acuity. Nose:  No deformity, discharge, or lesions. Mouth:  No deformity or lesions,oropharynx pink & moist. Neck:  Supple; no masses or thyromegaly. Lungs:  Respirations even and unlabored.  Clear throughout to auscultation.   No wheezes, crackles, or rhonchi. No acute distress. Heart:  Regular rate and rhythm; no murmurs, clicks, rubs, or gallops. Abdomen:  Normal bowel sounds. Soft, non-tender and non-distended without masses, hepatosplenomegaly or hernias noted.  No guarding or rebound tenderness.  Midline vertical incision site with staples intact, nontender, appears healthy, 2 drains in place, one drain is empty and the other drain has amber color fluid Rectal: Not performed Msk:  Symmetrical without gross deformities. Good, equal movement & strength bilaterally. Pulses:  Normal pulses noted. Extremities:  No clubbing or edema.  No cyanosis. Neurologic:  Alert and oriented x3;  grossly normal neurologically. Skin:  Intact without significant lesions or rashes. No jaundice. Psych:  Alert and cooperative. Normal mood and affect.  Imaging Studies: None  Assessment and Plan:   JEDI CATALFAMO is a 85 y.o. Caucasian male with history of A. fib, status post pacemaker, on Eliquis with history of tubular adenoma of colon in 2006, chronic constipation and clear mucus drainage per rectum, sigmoid volvulus on 10/07/2018  status post flex sig with decompression, persistent volvulus status post sigmoid colectomy, 39 cm of the sigmoid colon removed with primary anastomosis on 10/13/2018 is here for follow-up of dark stools that started since surgery.  Hemoglobin 3 days ago 12.4 which is improving postoperatively.  The nature of dark stool has not changed since surgery.  He denies epigastric pain.  Possible that he may have stress ulcer or peptic ulcer disease or slow oozing from anastomosis while on Eliquis but he is  not demonstrating significant blood loss.  Dark stool Increase omeprazole to 20 mg twice daily Repeat CBC in 1 week, if hemoglobin is declining, will perform EGD Told patient to contact my office if the stools are turning darker or black tarry or if he is seeing blood He has follow-up appointment with general surgery next week   Tubular adenomas of colon Recommend surveillance colonoscopy in 09/2022   Follow up in 2 weeks  Cephas Darby, MD

## 2018-10-24 NOTE — Telephone Encounter (Signed)
Patient called into the office stating that his drain is draining a yellowish liquid, he has no fevers, no chills nausea or vomiting he states it is uncomfortable no bleeding. I have informed Dr.Davis and waiting for a response.

## 2018-10-28 ENCOUNTER — Ambulatory Visit (INDEPENDENT_AMBULATORY_CARE_PROVIDER_SITE_OTHER): Payer: Medicare Other | Admitting: Surgery

## 2018-10-28 ENCOUNTER — Encounter: Payer: Self-pay | Admitting: Surgery

## 2018-10-28 ENCOUNTER — Other Ambulatory Visit: Payer: Self-pay

## 2018-10-28 VITALS — BP 108/66 | HR 83 | Temp 97.5°F | Ht 71.0 in | Wt 162.2 lb

## 2018-10-28 DIAGNOSIS — Z4889 Encounter for other specified surgical aftercare: Secondary | ICD-10-CM

## 2018-10-28 DIAGNOSIS — K562 Volvulus: Secondary | ICD-10-CM

## 2018-10-28 DIAGNOSIS — K219 Gastro-esophageal reflux disease without esophagitis: Secondary | ICD-10-CM

## 2018-10-28 NOTE — Patient Instructions (Addendum)
Patient is to return to the office in 1 week you may resume taking normal showers, do not pull at the surgical tape on the wound it will fall off by it's self. Change dressing daily if needed. Use cup that was given to you at today's visit to keep track of output.   Call the office with any questions or concerns.

## 2018-10-28 NOTE — Progress Notes (Signed)
Surgical Clinic Progress/Follow-up Note   HPI:  85 y.o. Male presents to clinic for post-op follow-up 15 Days s/p open sigmoid and partial descending colectomy with primary linear stapled anastomosis Rosana Hoes, 10/13/2018) for persistent partially obstructing sigmoid volvulus s/p endoscopic decompression without placement of rectal tube. Patient reports he's had a good appetite post-operatively and has been tolerating regular diet without any N/V or abdominal distention. He denies having experienced any peri-incisional abdominal pain since being discharged from [facility], and the post-prandial epigastric pain he reported at his initial outpatient surgical appointment last week has improved (though not completely resolved) with taking Prilosec (increased to BID by Dr. Marius Ditch). Patient also describes his BM's have become less dark/black (more brown), but remain dark. Patient says he has a follow-up appointment scheduled with Dr. Marius Ditch, though he forgets the date. Otherwise, patient's LUQ drain has only drained scant thin milky fluid >4 days, while LLQ drain continues >50 mL clear yellow-colored fluid daily. He denies any fever/chills, CP, or SOB.  Review of Systems:  Constitutional: denies fever/chills  Respiratory: denies shortness of breath, wheezing  Cardiovascular: denies chest pain, palpitations  Gastrointestinal: abdominal pain, N/V, and bowel function as per interval history Skin: Denies any other rashes or skin discolorations except post-surgical wounds as per interval history  Vital Signs:  BP 108/66   Pulse 83   Temp (!) 97.5 F (36.4 C) (Temporal)   Ht 5\' 11"  (1.803 m)   Wt 162 lb 3.2 oz (73.6 kg)   SpO2 97%   BMI 22.62 kg/m    Physical Exam:  Constitutional:  -- Normal body habitus  -- Awake, alert, and oriented x3  Pulmonary:  -- No crackles -- Equal breath sounds bilaterally -- Breathing non-labored at rest Cardiovascular:  -- S1, S2 present  -- No pericardial rubs   Gastrointestinal:  -- Soft and non-distended, non-tender to palpation, no guarding/rebound tenderness -- Post-surgical incisions all well-approximated without any peri-incisional erythema or drainage, staples remain -- Left-sided surgically placed drains well-secured without surrounding erythema, LUQ drain: scant thin non-purulent milky fluid, LLQ drain: >50 mL clear yellow-colored fluid -- No abdominal masses appreciated, pulsatile or otherwise  Musculoskeletal / Integumentary:  -- Wounds or skin discoloration: None appreciated except post-surgical incisions as described above (GI) -- Extremities: B/L UE and LE FROM, hands and feet warm, no edema   Laboratory studies:  CBC Latest Ref Rng & Units 10/21/2018 10/16/2018 10/15/2018  WBC 4.0 - 10.5 K/uL - 6.4 7.1  Hemoglobin 13.0 - 17.0 g/dL 12.4(L) 10.6(L) 11.4(L)  Hematocrit 39.0 - 52.0 % 37.1(L) 31.4(L) 33.7(L)  Platelets 150 - 400 K/uL - 168 165   CMP Latest Ref Rng & Units 10/16/2018 10/15/2018 10/14/2018  Glucose 70 - 99 mg/dL 82 95 -  BUN 8 - 23 mg/dL 20 23 -  Creatinine 0.61 - 1.24 mg/dL 1.18 1.33(H) -  Sodium 135 - 145 mmol/L 136 137 -  Potassium 3.5 - 5.1 mmol/L 3.7 3.9 3.5  Chloride 98 - 111 mmol/L 106 106 -  CO2 22 - 32 mmol/L 25 25 -  Calcium 8.9 - 10.3 mg/dL 8.5(L) 8.5(L) -  Total Protein 6.5 - 8.1 g/dL - - -  Total Bilirubin 0.3 - 1.2 mg/dL - - -  Alkaline Phos 38 - 126 U/L - - -  AST 15 - 41 U/L - - -  ALT 0 - 44 U/L - - -   Imaging: No new pertinent imaging available for review  Assessment:  85 y.o. yo Male with a problem list  including...  Patient Active Problem List   Diagnosis Date Noted  . Abdominal pain 10/07/2018  . Sigmoid volvulus ([place])   . Adenoma of colon   . Visual distortion 07/14/2018  . Tick bite of left lower leg 04/14/2018  . Upper abdominal pain 09/13/2017  . Dark urine 07/07/2017  . Mood disorder ([place]) 12/03/2016  . Chronic constipation 01/10/2015  . Acute diverticulitis   . Counseling regarding  advanced directives 04/23/2014  . Routine general medical examination at a health care facility 04/23/2014  . Osteoarthritis of left shoulder 10/21/2013  . GERD (gastroesophageal reflux disease) 02/09/2013  . ACTINIC KERATOSIS 05/04/2010  . Pacemaker  medtronic 02/06/2010  . Sick sinus syndrome ([place]) 10/27/2009  . Atrial fibrillation ([place]) 10/06/2009  . HEPATIC CYST 10/06/2009  . Hyperlipemia 07/12/2009  . Essential hypertension, benign 07/12/2009  . ALLERGIC RHINITIS 07/12/2009  . BPH with obstruction/lower urinary tract symptoms 07/12/2009  . OSTEOARTHRITIS 07/12/2009    presents to clinic for post-op follow-up evaluation, doing overall well despite improved (albeit non-resolved) melena and post-prandial epigastric abdominal pain 15 Days s/p open sigmoid and partial descending colectomy with primary linear stapled anastomosis Rosana Hoes, 10/13/2018) for persistent partially obstructing sigmoid volvulus s/p endoscopic decompression without placement of rectal tube.  Plan:              - advance diet as tolerated  - continue PPI BID as per Dr. Marius Ditch  - outpatient GI follow-up as scheduled  - LUQ drain with scant milky drainage removed, staples removed, steri-strips applied             - no heavy lifting >40 lbs x 4 more weeks, after which may gradually resume all activities without restrictions             - apply sunblock particularly to incisions with sun exposure to reduce pigmentation of scars             - return to clinic in 1 week for likely removal of LLQ drain  - instructed to call office if any questions or concerns  All of the above recommendations were discussed with the patient and patient's family, and all of patient's and family's questions were answered to their expressed satisfaction.  -- Marilynne Drivers Rosana Hoes, MD, [place]: [facility] Partner[facility] for exceptional care. Office: [phone]

## 2018-10-30 ENCOUNTER — Other Ambulatory Visit: Payer: Self-pay

## 2018-10-30 DIAGNOSIS — R195 Other fecal abnormalities: Secondary | ICD-10-CM | POA: Diagnosis not present

## 2018-10-31 LAB — CBC
Hematocrit: 35.2 % — ABNORMAL LOW (ref 37.5–51.0)
Hemoglobin: 12.3 g/dL — ABNORMAL LOW (ref 13.0–17.7)
MCH: 31.4 pg (ref 26.6–33.0)
MCHC: 34.9 g/dL (ref 31.5–35.7)
MCV: 90 fL (ref 79–97)
Platelets: 520 10*3/uL — ABNORMAL HIGH (ref 150–450)
RBC: 3.92 x10E6/uL — ABNORMAL LOW (ref 4.14–5.80)
RDW: 12.8 % (ref 11.6–15.4)
WBC: 10.9 10*3/uL — ABNORMAL HIGH (ref 3.4–10.8)

## 2018-10-31 LAB — IRON AND TIBC
Iron Saturation: 11 % — ABNORMAL LOW (ref 15–55)
Iron: 26 ug/dL — ABNORMAL LOW (ref 38–169)
Total Iron Binding Capacity: 231 ug/dL — ABNORMAL LOW (ref 250–450)
UIBC: 205 ug/dL (ref 111–343)

## 2018-10-31 LAB — FOLATE: FOLATE: 5.9 ng/mL (ref >3.0)

## 2018-10-31 LAB — FERRITIN: FERRITIN: 202 ng/mL (ref 30–400)

## 2018-10-31 LAB — VITAMIN B12: VITAMIN B 12: 236 pg/mL (ref 232–1245)

## 2018-11-01 ENCOUNTER — Encounter: Payer: Self-pay | Admitting: Emergency Medicine

## 2018-11-01 ENCOUNTER — Emergency Department: Payer: Medicare Other

## 2018-11-01 ENCOUNTER — Emergency Department
Admission: EM | Admit: 2018-11-01 | Discharge: 2018-11-01 | Disposition: A | Discharge status: Home / Self Care | Payer: Medicare Other | Attending: Emergency Medicine | Admitting: Emergency Medicine

## 2018-11-01 ENCOUNTER — Telehealth: Payer: Self-pay | Admitting: General Surgery

## 2018-11-01 DIAGNOSIS — Z87891 Personal history of nicotine dependence: Secondary | ICD-10-CM | POA: Diagnosis not present

## 2018-11-01 DIAGNOSIS — Z950 Presence of cardiac pacemaker: Secondary | ICD-10-CM | POA: Diagnosis not present

## 2018-11-01 DIAGNOSIS — K5730 Diverticulosis of large intestine without perforation or abscess without bleeding: Secondary | ICD-10-CM | POA: Diagnosis not present

## 2018-11-01 DIAGNOSIS — A0472 Enterocolitis due to Clostridium difficile, not specified as recurrent: Secondary | ICD-10-CM | POA: Diagnosis not present

## 2018-11-01 DIAGNOSIS — Z79899 Other long term (current) drug therapy: Secondary | ICD-10-CM | POA: Insufficient documentation

## 2018-11-01 DIAGNOSIS — R1084 Generalized abdominal pain: Secondary | ICD-10-CM | POA: Diagnosis present

## 2018-11-01 DIAGNOSIS — I10 Essential (primary) hypertension: Secondary | ICD-10-CM | POA: Insufficient documentation

## 2018-11-01 LAB — COMPREHENSIVE METABOLIC PANEL
ALK PHOS: 68 U/L (ref 38–126)
ALT: 14 U/L (ref 0–44)
ANION GAP: 8 (ref 5–15)
AST: 13 U/L — ABNORMAL LOW (ref 15–41)
Albumin: 3.5 g/dL (ref 3.5–5.0)
BUN: 18 mg/dL (ref 8–23)
CO2: 26 mmol/L (ref 22–32)
Calcium: 8.8 mg/dL — ABNORMAL LOW (ref 8.9–10.3)
Chloride: 103 mmol/L (ref 98–111)
Creatinine, Ser: 1.13 mg/dL (ref 0.61–1.24)
GFR calc Af Amer: >60 mL/min (ref >60)
GFR calc non Af Amer: 60 mL/min — ABNORMAL LOW (ref >60)
Glucose, Bld: 114 mg/dL — ABNORMAL HIGH (ref 70–99)
Potassium: 3.6 mmol/L (ref 3.5–5.1)
SODIUM: 137 mmol/L (ref 135–145)
Total Bilirubin: 0.6 mg/dL (ref 0.3–1.2)
Total Protein: 7.4 g/dL (ref 6.5–8.1)

## 2018-11-01 LAB — C DIFFICILE QUICK SCREEN W PCR REFLEX
C DIFFICILE (CDIFF) TOXIN: POSITIVE — AB
C DIFFICLE (CDIFF) ANTIGEN: POSITIVE — AB
C Diff interpretation: DETECTED

## 2018-11-01 LAB — URINALYSIS, COMPLETE (UACMP) WITH MICROSCOPIC
Bacteria, UA: NONE SEEN
Bilirubin Urine: NEGATIVE
Glucose, UA: NEGATIVE mg/dL
HGB URINE DIPSTICK: NEGATIVE
Ketones, ur: NEGATIVE mg/dL
Leukocytes, UA: NEGATIVE
Nitrite: NEGATIVE
Protein, ur: 30 mg/dL — AB
Specific Gravity, Urine: 1.027 (ref 1.005–1.030)
pH: 5 (ref 5.0–8.0)

## 2018-11-01 LAB — CBC
HCT: 37.9 % — ABNORMAL LOW (ref 39.0–52.0)
Hemoglobin: 12.5 g/dL — ABNORMAL LOW (ref 13.0–17.0)
MCH: 30.3 pg (ref 26.0–34.0)
MCHC: 33 g/dL (ref 30.0–36.0)
MCV: 92 fL (ref 80.0–100.0)
NRBC: 0 % (ref 0.0–0.2)
PLATELETS: 422 10*3/uL — AB (ref 150–400)
RBC: 4.12 MIL/uL — ABNORMAL LOW (ref 4.22–5.81)
RDW: 12.7 % (ref 11.5–15.5)
WBC: 9.8 10*3/uL (ref 4.0–10.5)

## 2018-11-01 LAB — LACTIC ACID, PLASMA: Lactic Acid, Venous: 0.8 mmol/L (ref 0.5–1.9)

## 2018-11-01 MED ORDER — VANCOMYCIN 50 MG/ML ORAL SOLUTION: 125.0000 mg | Freq: Four times a day (QID) | ORAL | 0 refills | Status: AC

## 2018-11-01 MED ORDER — ONDANSETRON HCL 4 MG/2ML IJ SOLN
4.0000 mg | Freq: Once | INTRAMUSCULAR | Status: AC
  Administered 2018-11-01: 4 mg via INTRAVENOUS
  Filled 2018-11-01: qty 2

## 2018-11-01 MED ORDER — VANCOMYCIN 50 MG/ML ORAL SOLUTION
125.0000 mg | Freq: Four times a day (QID) | ORAL | Status: DC
  Administered 2018-11-01: 125 mg via ORAL
  Filled 2018-11-01 (×2): qty 2.5

## 2018-11-01 MED ORDER — FENTANYL CITRATE (PF) 100 MCG/2ML IJ SOLN
50.0000 ug | Freq: Once | INTRAMUSCULAR | Status: AC
  Administered 2018-11-01: 50 ug via INTRAVENOUS
  Filled 2018-11-01: qty 2

## 2018-11-01 MED ORDER — ONDANSETRON 4 MG PO TBDP: 4.0000 mg | ORAL_TABLET | Freq: Three times a day (TID) | ORAL | 0 refills | Status: DC | PRN

## 2018-11-01 MED ORDER — IOPAMIDOL (ISOVUE-300) INJECTION 61%: 30.0000 mL | Freq: Once | INTRAVENOUS | Status: DC | PRN

## 2018-11-01 MED ORDER — IOPAMIDOL (ISOVUE-300) INJECTION 61%
100.0000 mL | Freq: Once | INTRAVENOUS | Status: AC | PRN
  Administered 2018-11-01: 100 mL via INTRAVENOUS

## 2018-11-01 MED ORDER — SODIUM CHLORIDE 0.9 % IV BOLUS: 1000.0000 mL | Freq: Once | INTRAVENOUS | Status: DC

## 2018-11-01 NOTE — ED Triage Notes (Signed)
Pt to ED by referral by PCP for possible dehydration. Pt states has had diarrhea for approx 1 week and nausea but no vomiting. Pt recently had surgery to colon.

## 2018-11-01 NOTE — ED Notes (Signed)
Sandwich tray and water provided to pt.

## 2018-11-01 NOTE — Telephone Encounter (Signed)
Patient's wife called referring that patient is feeling bad. He is having diarrhea and abdominal pian. Patient was seen recently by Dr. Rosana Hoes in the clinic 4 days ago and she was doing well. I recommended the patient to go to ED for hydration and complete evaluation of the possibilities of the diarrhea.  If there is any surgical issues or complication will be available for evaluation at the ED.  Patient's wife understood.

## 2018-11-01 NOTE — ED Provider Notes (Signed)
[facility] Emergency Department Provider Note  ____________________________________________  Time seen: Approximately 7:08 PM  I have reviewed the triage vital signs and the nursing notes.   HISTORY  Chief Complaint Dehydration   HPI [name] is a 85 y.o. male POD 19 from partial colectomy due to volvuluswho presents for evaluation of abdominal pain and diarrhea. Patient reports several daily episodes of diarrhea over the last week.  He reports a bowel movement every time he tries to eat something.  He has had nausea but no vomiting.  Also complaining of increasing cramping diffuse abdominal pain currently severe.  No fever or chills, no vomiting.  No prior history of C. difficile.  Patient has recently been in the hospital, underwent surgery and has been on antibiotics.  He denies dysuria or hematuria.  His doctor was concerned patient was dehydrated and sent him to the emergency room for evaluation.  No melena or hematochezia.  Past Medical History:  Diagnosis Date  . Acute diverticulitis 12/15   [facility]  . Acute diverticulitis   . Allergic rhinitis   . Atrial fibrillation or flutter 12/10   Has been paroxysmal. Patient has not tolerated coumadin due to headaches. ECHO (12/10) Mild LVH, EF 55%,   . BPH (benign prostatic hyperplasia)   . Chest pain 12/10   Lexiscan myoview with EF 55%, no ischemia or infarction  . Dysrhythmia    AFIB  . HOH (hard of hearing)   . Hyperlipemia   . Hypertension   . Osteoarthritis   . Pacemaker  medtronic 02/06/2010   Qualifier: Diagnosis of  By: Lovena Le, MD, [facility], Binnie Kand   . Presence of permanent cardiac pacemaker   . Sick sinus syndrome (HCC)    S/P dual chamber Medtronic PCM  . Sigmoid volvulus [facility])     Patient Active Problem List   Diagnosis Date Noted  . Abdominal pain 10/07/2018  . Adenoma of colon   . Visual distortion 07/14/2018  . Tick bite of left lower leg 04/14/2018  . Upper abdominal pain  09/13/2017  . Dark urine 07/07/2017  . Mood disorder ([place]) 12/03/2016  . Chronic constipation 01/10/2015  . Acute diverticulitis   . Counseling regarding advanced directives 04/23/2014  . Routine general medical examination at a health care facility 04/23/2014  . Osteoarthritis of left shoulder 10/21/2013  . GERD (gastroesophageal reflux disease) 02/09/2013  . ACTINIC KERATOSIS 05/04/2010  . Pacemaker  medtronic 02/06/2010  . Sick sinus syndrome ([place]) 10/27/2009  . Atrial fibrillation ([place]) 10/06/2009  . HEPATIC CYST 10/06/2009  . Hyperlipemia 07/12/2009  . Essential hypertension, benign 07/12/2009  . ALLERGIC RHINITIS 07/12/2009  . BPH with obstruction/lower urinary tract symptoms 07/12/2009  . OSTEOARTHRITIS 07/12/2009    Past Surgical History:  Procedure Laterality Date  . CATARACT EXTRACTION W/PHACO Left 01/16/2017   Procedure: CATARACT EXTRACTION PHACO AND INTRAOCULAR LENS PLACEMENT (IOC);  Surgeon: Estill Cotta, MD;  Location: [facility] ORS;  Service: Ophthalmology;  Laterality: Left;  [place] 1:51.3 AP% 25.9 CDE 54.80 FLUID PACK LOT # [id number]  . COLONOSCOPY WITH PROPOFOL N/A 09/15/2018   Procedure: COLONOSCOPY WITH PROPOFOL;  Surgeon: Lin Landsman, MD;  Location: [facility] ENDOSCOPY;  Service: Gastroenterology;  Laterality: N/A;  . dual chamber pacemaker  01/11   Dr. Lovena Le  . FLEXIBLE SIGMOIDOSCOPY N/A 10/07/2018   Procedure: FLEXIBLE SIGMOIDOSCOPY;  Surgeon: Virgel Manifold, MD;  Location: [facility] ENDOSCOPY;  Service: Endoscopy;  Laterality: N/A;  . LAPAROSCOPIC CHOLECYSTECTOMY  1/14   Dr Bary Castilla  .  PARTIAL COLECTOMY N/A 10/13/2018   Procedure: LAPAROTOMY, LEFT COLECTOMY,;  Surgeon: Vickie Epley, MD;  Location: [facility] ORS;  Service: General;  Laterality: N/A;  . VASECTOMY  in his 45's    Prior to Admission medications   Medication Sig Start Date End Date Taking? Authorizing Provider  acetaminophen (TYLENOL) 325 MG tablet Take 650 mg by mouth every 6 (six) hours as  needed. Reported on 01/17/2016    [provider]  ALPRAZolam Duanne Moron) 0.25 MG tablet TAKE 1 TABLET BY MOUTH TWICE DAILY AS NEEDED FOR ANXIETY 08/08/18   [name], MD  apixaban (ELIQUIS) 2.5 MG TABS tablet Take 2.5 mg by mouth 2 (two) times daily.    [provider]  diltiazem (TIAZAC) 180 MG 24 hr capsule Take 180 mg by mouth daily.    [provider]  finasteride (PROSCAR) 5 MG tablet Take 1 tablet (5 mg total) by mouth daily. 09/13/17   [name], MD  flecainide (TAMBOCOR) 100 MG tablet Take 1 tablet (100 mg total) by mouth 2 (two) times daily. 12/13/14   Deboraha Sprang, MD  Glucosamine 500 MG CAPS Take by mouth daily.      [provider]  ibuprofen (ADVIL,MOTRIN) 200 MG tablet Take 200 mg by mouth every 6 (six) hours as needed for moderate pain.    [provider]  ondansetron (ZOFRAN ODT) 4 MG disintegrating tablet Take 1 tablet (4 mg total) by mouth every 8 (eight) hours as needed. 11/01/18   Rudene Re, MD  oxyCODONE (OXY IR/ROXICODONE) 5 MG immediate release tablet Take 1 tablet (5 mg total) by mouth every 4 (four) hours as needed for severe pain. 10/16/18   Tylene Fantasia, PA-C  ranitidine (ZANTAC) 150 MG tablet Take 150 mg by mouth as needed for heartburn.    [provider]  tamsulosin (FLOMAX) 0.4 MG CAPS capsule TAKE ONE CAPSULE BY MOUTH EVERY DAY 08/16/14   [name], MD  vancomycin (VANCOCIN) 50 mg/mL oral solution Take 2.5 mLs (125 mg total) by mouth every 6 (six) hours for 10 days. 11/01/18 11/11/18  Rudene Re, MD    Allergies Augmentin [amoxicillin-pot clavulanate] and Morphine and related  Family History  Problem Relation Age of Onset  . Hypertension Mother   . Alcohol abuse Father   . Bladder Cancer Neg Hx   . Prostate cancer Neg Hx   . Kidney cancer Neg Hx     Social History Social History   Tobacco Use  . Smoking status: Former Research scientist (life sciences)  . Smokeless tobacco: Never Used  .  Tobacco comment: In [place], quit in 1960's  Substance Use Topics  . Alcohol use: No  . Drug use: No    Review of Systems  Constitutional: Negative for fever. Eyes: Negative for visual changes. ENT: Negative for sore throat. Neck: No neck pain  Cardiovascular: Negative for chest pain. Respiratory: Negative for shortness of breath. Gastrointestinal: + diffuse abdominal pain, nausea, diarrhea. Genitourinary: Negative for dysuria. Musculoskeletal: Negative for back pain. Skin: Negative for rash. Neurological: Negative for headaches, weakness or numbness. Psych: No SI or HI  ____________________________________________   PHYSICAL EXAM:  VITAL SIGNS: ED Triage Vitals  Enc Vitals Group     BP 11/01/18 1621 (!) 128/59     Pulse Rate 11/01/18 1621 65     Resp 11/01/18 1621 16     Temp 11/01/18 1621 97.9 F (36.6 C)     Temp Source 11/01/18 1621 Oral     SpO2  11/01/18 1621 97 %     Weight --      Height --      Head Circumference --      Peak Flow --      Pain Score 11/01/18 1622 0     Pain Loc --      Pain Edu? --      Excl. in [place]? --     Constitutional: Alert and oriented. Well appearing and in no apparent distress. HEENT:      Head: Normocephalic and atraumatic.         Eyes: Conjunctivae are normal. Sclera is non-icteric.       Mouth/Throat: Mucous membranes are moist.       Neck: Supple with no signs of meningismus. Cardiovascular: Regular rate and rhythm. No murmurs, gallops, or rubs. 2+ symmetrical distal pulses are present in all extremities. No JVD. Respiratory: Normal respiratory effort. Lungs are clear to auscultation bilaterally. No wheezes, crackles, or rhonchi.  Gastrointestinal: Soft, non tender, and non distended with positive bowel sounds. No rebound or guarding. JP drain in place with minimal serous sanguineous drainage Musculoskeletal: Nontender with normal range of motion in all extremities. No edema, cyanosis, or erythema of extremities. Neurologic:  Normal speech and language. Face is symmetric. Moving all extremities. No gross focal neurologic deficits are appreciated. Skin: Skin is warm, dry and intact. No rash noted. Psychiatric: Mood and affect are normal. Speech and behavior are normal.  ____________________________________________   LABS (all labs ordered are listed, but only abnormal results are displayed)  Labs Reviewed  C DIFFICILE QUICK SCREEN W PCR REFLEX - Abnormal; Notable for the following components:      Result Value   C Diff antigen POSITIVE (*)    C Diff toxin POSITIVE (*)    All other components within normal limits  COMPREHENSIVE METABOLIC PANEL - Abnormal; Notable for the following components:   Glucose, Bld 114 (*)    Calcium 8.8 (*)    AST 13 (*)    GFR calc non Af Amer 60 (*)    All other components within normal limits  CBC - Abnormal; Notable for the following components:   RBC 4.12 (*)    Hemoglobin 12.5 (*)    HCT 37.9 (*)    Platelets 422 (*)    All other components within normal limits  URINALYSIS, COMPLETE (UACMP) WITH MICROSCOPIC - Abnormal; Notable for the following components:   Color, Urine AMBER (*)    APPearance CLEAR (*)    Protein, ur 30 (*)    All other components within normal limits  LACTIC ACID, PLASMA   ____________________________________________  EKG  none  ____________________________________________  RADIOLOGY  I have personally reviewed the images performed during this visit and I agree with the Radiologist's read.   Interpretation by Radiologist:  Ct Abdomen Pelvis W Contrast  Result Date: 11/01/2018 CLINICAL DATA:  Possible dehydration. Diarrhea for the past week. Status post colectomy with an anastomosis and now with worsening abdominal pain. EXAM: CT ABDOMEN AND PELVIS WITH CONTRAST TECHNIQUE: Multidetector CT imaging of the abdomen and pelvis was performed using the standard protocol following bolus administration of intravenous contrast. CONTRAST:  181mL  ISOVUE-300 IOPAMIDOL (ISOVUE-300) INJECTION 61% COMPARISON:  10/13/2018. FINDINGS: Lower chest: Stable mild linear atelectasis or scarring at the left lung base and interval mild linear atelectasis at the right lung base. Stable enlarged heart and pacemaker leads. Hepatobiliary: Multiple liver cysts are again demonstrated. Cholecystectomy clips. Pancreas: Unremarkable. No pancreatic ductal dilatation or  surrounding inflammatory changes. Spleen: Normal in size without focal abnormality. Adrenals/Urinary Tract: Normal appearing adrenal glands. Poorly distended urinary bladder. Normal appearing kidneys. No visualized ureteral abnormalities. Stomach/Bowel: Interval mild-to-moderate low density wall thickening involving the proximal and mid sigmoid colon in an area of multiple diverticula extending to the level of a distal sigmoid colon anastomosis, not previously present. The remainder of the colon is unremarkable. No evidence of appendicitis. Unremarkable stomach. Single dilated loop of jejunum in the right upper abdomen, anterior to the liver. This is dilated to the level of moderate concentric wall thickening with enhancement to the right of midline in the mid upper abdomen, extending into the upper pelvis. Mesenteric swirling in the right pelvis and lower abdomen is again demonstrated without the previously demonstrated obstruction at that level. Vascular/Lymphatic: Atheromatous arterial calcifications without aneurysm. No enlarged lymph nodes. Reproductive: Markedly enlarged prostate gland. Other: Small left inguinal hernia containing fat. Midline surgical scar. Left abdominal drain with the tip beneath the left hemidiaphragm. The drain enters at the level of the mid upper pelvis on the left. Musculoskeletal: Extensive lumbar and lower thoracic spine degenerative changes and levoconvex scoliosis. L1 vertebral hemangioma and smaller lower thoracic vertebral hemangiomas. IMPRESSION: 1. Concentric wall thickening and  enhancement vomiting a loop of jejunum in the abdomen on the right, compatible with infectious or inflammatory enteritis. This is causing partial obstruction of a loop of jejunum in the right upper abdomen. 2. Interval mild-to-moderate concentric wall thickening involving the proximal and mid sigmoid colon in an area of multiple diverticula extending to the level of a distal sigmoid colon anastomosis. This could be due to diverticulitis or postsurgical edema. 3. Markedly enlarged prostate gland. Electronically Signed   By: Claudie Revering M.D.   On: 11/01/2018 20:08      ____________________________________________   PROCEDURES  Procedure(s) performed: None Procedures Critical Care performed:  None ____________________________________________   INITIAL IMPRESSION / ASSESSMENT AND PLAN / ED COURSE   85 y.o. male POD 29 from partial colectomy due to volvuluswho presents for evaluation of abdominal pain and diarrhea.   Ddx colitis (ischemic vs infectious), C. Diff, post-op complications (infection, anastamotic leak).  Patient is well-appearing and in no significant distress, normal vital signs, abdomen is soft with no tenderness, no rebound or guarding, JP drain in place draining a very small amount of serosanguineous fluid.  Labs showing normal CBC, normal CMP. Will send UA, C. Diff stool culture and send patient for CT a/p     _________________________ 10:29 PM on 11/01/2018 -----------------------------------------  Labs with no acute findings.  No leukocytosis, normal lactic.  CT was done which is consistent with enteritis.  There are no clinical signs of obstruction at this time.  Patient is tolerating p.o. in the emergency room with no vomiting, there is no distention, and is having several daily bowel movements and passing gas.  Discussed with Dr. Peyton Najjar from surgery who looked at the CT and does not see any evidence of obstruction.  Discussed with him starting patient on antibiotics  for questionable diverticulitis versus just edema at the anastomosis.  According to Dr. Peyton Najjar the CT looks just like normal postop changes.  He recommended against antibiotics.  C. difficile is positive patient was started on vancomycin.  Discussed return precautions for worsening abdominal pain, melena or bright red blood per rectum, or fever.  Patient has an appointment with Dr. Rosana Hoes in 2 days.  Recommended that he keeps this appointment.  Patient was given a prescription for vancomycin.  As part of my medical decision making, I reviewed the following data within the [place] notes reviewed and incorporated, Labs reviewed , Old chart reviewed, Radiograph reviewed , A consult was requested and obtained from this/these consultant(s) Surgery, Notes from prior ED visits and [place] Controlled Substance Database    Pertinent labs & imaging results that were available during my care of the patient were reviewed by me and considered in my medical decision making (see chart for details).    ____________________________________________   FINAL CLINICAL IMPRESSION(S) / ED DIAGNOSES  Final diagnoses:  C. difficile diarrhea      NEW MEDICATIONS STARTED DURING THIS VISIT:  ED Discharge Orders         Ordered    vancomycin (VANCOCIN) 50 mg/mL oral solution  Every 6 hours     11/01/18 2233    ondansetron (ZOFRAN ODT) 4 MG disintegrating tablet  Every 8 hours PRN     11/01/18 2233           Note:  This document was prepared using Dragon voice recognition software and may include unintentional dictation errors.    Rudene Re, MD 11/01/18 2233

## 2018-11-04 ENCOUNTER — Encounter: Payer: Self-pay | Admitting: Surgery

## 2018-11-04 ENCOUNTER — Other Ambulatory Visit: Payer: Self-pay

## 2018-11-04 ENCOUNTER — Ambulatory Visit (INDEPENDENT_AMBULATORY_CARE_PROVIDER_SITE_OTHER): Payer: Medicare Other | Admitting: Surgery

## 2018-11-04 ENCOUNTER — Telehealth: Payer: Self-pay

## 2018-11-04 VITALS — BP 129/76 | HR 81 | Temp 97.7°F | Ht 71.0 in | Wt 158.4 lb

## 2018-11-04 DIAGNOSIS — Z4889 Encounter for other specified surgical aftercare: Secondary | ICD-10-CM

## 2018-11-04 NOTE — Progress Notes (Signed)
Surgical Clinic Progress/Follow-up Note   HPI:  85 y.o. Male presents to clinic for subsequent post-op follow-up and anticipated removal of surgically placed LLQ drain (directed towards LUQ) 22 Days s/p sigmoid and partial descending colectomy with primary linear stapled anastomosis Rosana Hoes, 10/13/2018) for mostly obstructing sigmoid volvulus without ischemia. While patient continues to deny peri-incisional abdominal pain, he presented to [facility] ED during the interval period between his prior follow-up appointment and now for profuse watery diarrhea and weakness. Patient was at that time diagnosed with c Diff + toxin, started on oral vancomycin, and discharged home from ED, 2 days after which he experienced complete resolution of his diarrhea. He continues to take his oral vancomycin as prescribed. Otherwise, patient reports "I feel great" with +flatus and normalized +BM's, no N/V, fever/chills, CP, or SOB.  Review of Systems:  Constitutional: denies fever/chills  Respiratory: denies shortness of breath, wheezing  Cardiovascular: denies chest pain, palpitations  Gastrointestinal: abdominal pain, N/V, and bowel function as per interval history Skin: Denies any other rashes or skin discolorations except post-surgical wounds as per interval history  Vital Signs:  BP 129/76   Pulse 81   Temp 97.7 F (36.5 C) (Temporal)   Ht 5\' 11"  (1.803 m)   Wt 158 lb 6.4 oz (71.8 kg)   SpO2 95%   BMI 22.09 kg/m    Physical Exam:  Constitutional:  -- Normal body habitus  -- Awake, alert, and oriented x3  Pulmonary:  -- No crackles -- Equal breath sounds bilaterally -- Breathing non-labored at rest Cardiovascular:  -- S1, S2 present  -- No pericardial rubs  Gastrointestinal:  -- Soft and non-distended, non-tender to palpation, no guarding/rebound tenderness -- Post-surgical incisions all well-approximated without any peri-incisional erythema or drainage -- No abdominal masses appreciated, pulsatile or  otherwise  Musculoskeletal / Integumentary:  -- Wounds or skin discoloration: None appreciated except post-surgical incisions as described above (GI) -- Extremities: B/L UE and LE FROM, hands and feet warm, no edema   Laboratory studies:  CBC Latest Ref Rng & Units 11/01/2018 10/30/2018 10/21/2018  WBC 4.0 - 10.5 K/uL 9.8 10.9(H) -  Hemoglobin 13.0 - 17.0 g/dL 12.5(L) 12.3(L) 12.4(L)  Hematocrit 39.0 - 52.0 % 37.9(L) 35.2(L) 37.1(L)  Platelets 150 - 400 K/uL 422(H) 520(H) -   CMP Latest Ref Rng & Units 11/01/2018 10/16/2018 10/15/2018  Glucose 70 - 99 mg/dL 114(H) 82 95  BUN 8 - 23 mg/dL 18 20 23   Creatinine 0.61 - 1.24 mg/dL 1.13 1.18 1.33(H)  Sodium 135 - 145 mmol/L 137 136 137  Potassium 3.5 - 5.1 mmol/L 3.6 3.7 3.9  Chloride 98 - 111 mmol/L 103 106 106  CO2 22 - 32 mmol/L 26 25 25   Calcium 8.9 - 10.3 mg/dL 8.8(L) 8.5(L) 8.5(L)  Total Protein 6.5 - 8.1 g/dL 7.4 - -  Total Bilirubin 0.3 - 1.2 mg/dL 0.6 - -  Alkaline Phos 38 - 126 U/L 68 - -  AST 15 - 41 U/L 13(L) - -  ALT 0 - 44 U/L 14 - -   Assessment:  85 y.o. yo Male with a problem list including...  Patient Active Problem List   Diagnosis Date Noted  . Abdominal pain 10/07/2018  . Adenoma of colon   . Visual distortion 07/14/2018  . Tick bite of left lower leg 04/14/2018  . Upper abdominal pain 09/13/2017  . Dark urine 07/07/2017  . Mood disorder ([place]) 12/03/2016  . Chronic constipation 01/10/2015  . Acute diverticulitis   . Counseling regarding  advanced directives 04/23/2014  . Routine general medical examination at a health care facility 04/23/2014  . Osteoarthritis of left shoulder 10/21/2013  . GERD (gastroesophageal reflux disease) 02/09/2013  . ACTINIC KERATOSIS 05/04/2010  . Pacemaker  medtronic 02/06/2010  . Sick sinus syndrome ([place]) 10/27/2009  . Atrial fibrillation ([place]) 10/06/2009  . HEPATIC CYST 10/06/2009  . Hyperlipemia 07/12/2009  . Essential hypertension, benign 07/12/2009  . ALLERGIC RHINITIS  07/12/2009  . BPH with obstruction/lower urinary tract symptoms 07/12/2009  . OSTEOARTHRITIS 07/12/2009    presents to clinic for subsequent post-op follow-up evaluation, doing very well following treatment and resolution of diarrhea attributable to recently diagnosed c difficile 22 Days s/p sigmoid and partial descending colectomy with primary linear stapled anastomosis Rosana Hoes, 10/13/2018) for mostly obstructing sigmoid volvulus without ischemia.  Plan:              - complete prescribed course of oral vancomycin             - gradually resume all activities prn without restrictions  - surgically placed LLQ drain (directed towards LUQ) removed  - remove dry gauze placed over former LLQ drain site, remove in 2 days, after which may again shower and okay to submerge incisions under water (baths, swimming) prn once healed             - apply sunblock particularly to incisions with sun exposure to reduce pigmentation of scars             - additional routine follow-up appointment offered, prefers to follow up as needed  - instructed to call office if any questions or concerns  All of the above recommendations were discussed with the patient and patient's family, and all of patient's and family's questions were answered to their expressed satisfaction.  -- Marilynne Drivers Rosana Hoes, MD, [place]: [facility] - Partnering for exceptional care. Office: [phone]

## 2018-11-04 NOTE — Patient Instructions (Signed)
Patient is to return to the office as needed.  Call the office with any questions or concerns. 

## 2018-11-04 NOTE — Telephone Encounter (Signed)
Tried to call pt to see how he was doing after ER visit yesterday. There was no answer. He has an appt with the General Surgeon today and one with GI on 11-07-18. Per Dr Silvio Pate, he did not need a F/U with him if he was seeing the surgeon.

## 2018-11-07 ENCOUNTER — Other Ambulatory Visit: Payer: Self-pay

## 2018-11-07 ENCOUNTER — Ambulatory Visit (INDEPENDENT_AMBULATORY_CARE_PROVIDER_SITE_OTHER): Payer: No Typology Code available for payment source | Admitting: Gastroenterology

## 2018-11-07 VITALS — BP 171/89 | HR 75 | Ht 71.0 in | Wt 161.8 lb

## 2018-11-07 DIAGNOSIS — R195 Other fecal abnormalities: Secondary | ICD-10-CM

## 2018-11-07 DIAGNOSIS — A09 Infectious gastroenteritis and colitis, unspecified: Secondary | ICD-10-CM

## 2018-11-07 NOTE — Progress Notes (Signed)
[name]  Cephas Darby, MD [name]  [place], [place] [address]  Main: [phone]  Fax: [phone]    Gastroenterology Consultation  Referring Provider:     [name], MD Primary Care Physician:  [name], MD Primary Gastroenterologist:  Dr. Cephas Darby Reason for Consultation: C. difficile diarrhea        HPI:   [name] is a 85 y.o. Caucasian male referred by Dr. [name], MD  for consultation & management of chronic rectal discharge.  Patient has history of A. fib, pacemaker on Eliquis, very active, functional independent who has been experiencing drainage of clear mucus per rectum. He has been suffering from constipation for several years, takes laxatives as needed.  Bowel movements are infrequent, every 2 to 3 days, generally hard associated with straining.  He tries to incorporate more fiber in his diet, has tried fiber supplements as well as stool softeners with not much relief.  He he has been noticing only clear mucus drainage per rectum.  Denies any fecal incontinence, diarrhea or rectal bleeding.  His weight has been stable.  He also reports intermittent bloating He is status post cholecystectomy in 2014  Follow-up visit 10/24/2018 After the colonoscopy, 3 weeks later patient developed sigmoid volvulus for which he was admitted to [facility].  He underwent flexible sigmoidoscopy with decompression on 10/07/2018.  2 days later, patient had persistent pain and CT revealed persistent changes of his sigmoid volvulus on 1/2 and 1/6 scans.  He underwent sigmoid colectomy, 39 cm of the sigmoid colon removed by Dr. Rosana Hoes and Dr. Hampton Abbot with primary anastomosis and JP drain placement.  Patient had uncomplicated postop recovery.  Patient was seen by Dr. Hampton Abbot as a surgery follow-up on 10/21/2018.  He reported dark stool, soft in consistency without any blood mixed with it.  Patient reported that he started noticing that stools are darker  since postop day 1.  Hemoglobin at Dr. Mont Dutton office was 12.4 improved from 10.6 during hospital stay.  Patient was started on over-the-counter Prilosec due to possibility of stress ulcers that have been causing dark stool.  Patient is here to discuss about the ongoing dark stool.  He reports doing well overall, recovering, denies any fatigue, good appetite, denies epigastric pain, reports 2-3 soft dark stools daily but denies dark red or bright red blood or black tarry stool He is taking Eliquis daily.  Follow-up visit 11/07/2018 Patient had worsening of diarrhea with lower abdominal cramps and went to ER on 11/01/2018, stool studies positive for C. difficile antigen and toxin.  He was started on oral vancomycin 125 mg p.o. for 10 days.  Patient reports that his symptoms have resolved, currently having formed bowel movements and no longer dark.  His hemoglobin is stable.  He denies any other symptoms today  NSAIDs: None  Antiplts/Anticoagulants/Anti thrombotics: Eliquis with history of A. fib  GI Procedures:  Colonoscopy 09/15/2018 - Hemorrhoids found on perianal exam. - One diminutive polyp in the cecum, removed with a cold biopsy forceps. Resected and retrieved. - Two 5 mm polyps in the transverse colon, removed with a cold snare. Resected and retrieved. - One 5 mm polyp in the ascending colon, removed with a cold snare. Resected and retrieved. - One 5 mm polyp in the descending colon, removed with a cold snare. Resected and retrieved. - One 7 mm polyp in the rectum, removed with a hot snare. Resected and retrieved. - Non-bleeding external and internal  hemorrhoids.  DIAGNOSIS:  A. COLON POLYP, CECUM; BIOPSY:  - 1 TUBULAR ADENOMA AND A FRAGMENT OF BENIGN COLONIC MUCOSA.  - MELANOSIS COLI.  - NEGATIVE FOR HIGH-GRADE DYSPLASIA AND MALIGNANCY.   B. COLON POLYP X2, TRANSVERSE; BIOPSY:  - TUBULAR ADENOMA (2 FRAGMENTS).  - NEGATIVE FOR HIGH-GRADE DYSPLASIA AND MALIGNANCY.   C. COLON POLYP,  ASCENDING; BIOPSY:  - SESSILE SERRATED POLYP (3 FRAGMENTS).  - MELANOSIS COLI.  -NEGATIVE FOR HIGH-GRADE DYSPLASIA AND MALIGNANCY.   D. COLON POLYP, DESCENDING; BIOPSY:  - FECAL MATERIAL ONLY.   E. RECTUM, POLYP; BIOPSY:  - TUBULAR ADENOMA.  - NEGATIVE FOR HIGH-GRADE DYSPLASIA AND MALIGNANCY.   Colonoscopy in 2006, tubular adenoma COLON, POLYP(S): ADENOMATOUS POLYP(S). NO HIGH GRADE DYSPLASIA OR INVASIVE MALIGNANCY IDENTIFIED.  COMMENT There is adenoma with nuclear stratification and decreased mucin. High grade dysplasia or invasive malignancy are not identified.  Past Medical History:  Diagnosis Date  . Acute diverticulitis 12/15   [facility]  . Acute diverticulitis   . Allergic rhinitis   . Atrial fibrillation or flutter 12/10   Has been paroxysmal. Patient has not tolerated coumadin due to headaches. ECHO (12/10) Mild LVH, EF 55%,   . BPH (benign prostatic hyperplasia)   . Chest pain 12/10   Lexiscan myoview with EF 55%, no ischemia or infarction  . Dysrhythmia    AFIB  . HOH (hard of hearing)   . Hyperlipemia   . Hypertension   . Osteoarthritis   . Pacemaker  medtronic 02/06/2010   Qualifier: Diagnosis of  By: Lovena Le, MD, [facility], Binnie Kand   . Presence of permanent cardiac pacemaker   . Sick sinus syndrome (HCC)    S/P dual chamber Medtronic PCM  . Sigmoid volvulus [facility])     Past Surgical History:  Procedure Laterality Date  . CATARACT EXTRACTION W/PHACO Left 01/16/2017   Procedure: CATARACT EXTRACTION PHACO AND INTRAOCULAR LENS PLACEMENT (IOC);  Surgeon: Estill Cotta, MD;  Location: [facility] ORS;  Service: Ophthalmology;  Laterality: Left;  [place] 1:51.3 AP% 25.9 CDE 54.80 FLUID PACK LOT # [id number]  . COLONOSCOPY WITH PROPOFOL N/A 09/15/2018   Procedure: COLONOSCOPY WITH PROPOFOL;  Surgeon: Lin Landsman, MD;  Location: [facility] ENDOSCOPY;  Service: Gastroenterology;  Laterality: N/A;  . dual chamber pacemaker  01/11   Dr. Lovena Le  . FLEXIBLE SIGMOIDOSCOPY N/A  10/07/2018   Procedure: FLEXIBLE SIGMOIDOSCOPY;  Surgeon: Virgel Manifold, MD;  Location: [facility] ENDOSCOPY;  Service: Endoscopy;  Laterality: N/A;  . LAPAROSCOPIC CHOLECYSTECTOMY  1/14   Dr Bary Castilla  . PARTIAL COLECTOMY N/A 10/13/2018   Procedure: LAPAROTOMY, LEFT COLECTOMY,;  Surgeon: Vickie Epley, MD;  Location: [facility] ORS;  Service: General;  Laterality: N/A;  . VASECTOMY  in his 7's    Current Outpatient Medications:  .  acetaminophen (TYLENOL) 325 MG tablet, Take 650 mg by mouth every 6 (six) hours as needed. Reported on 01/17/2016, Disp: , Rfl:  .  ALPRAZolam (XANAX) 0.25 MG tablet, TAKE 1 TABLET BY MOUTH TWICE DAILY AS NEEDED FOR ANXIETY, Disp: 60 tablet, Rfl: 0 .  apixaban (ELIQUIS) 2.5 MG TABS tablet, Take 2.5 mg by mouth 2 (two) times daily., Disp: , Rfl:  .  diltiazem (TIAZAC) 180 MG 24 hr capsule, Take 180 mg by mouth daily., Disp: , Rfl:  .  finasteride (PROSCAR) 5 MG tablet, Take 1 tablet (5 mg total) by mouth daily., Disp: 90 tablet, Rfl: 3 .  flecainide (TAMBOCOR) 100 MG tablet, Take 1 tablet (100 mg total) by mouth 2 (two) times  daily., Disp: 180 tablet, Rfl: 3 .  Glucosamine 500 MG CAPS, Take by mouth daily.  , Disp: , Rfl:  .  ibuprofen (ADVIL,MOTRIN) 200 MG tablet, Take 200 mg by mouth every 6 (six) hours as needed for moderate pain., Disp: , Rfl:  .  ondansetron (ZOFRAN ODT) 4 MG disintegrating tablet, Take 1 tablet (4 mg total) by mouth every 8 (eight) hours as needed., Disp: 20 tablet, Rfl: 0 .  oxyCODONE (OXY IR/ROXICODONE) 5 MG immediate release tablet, Take 1 tablet (5 mg total) by mouth every 4 (four) hours as needed for severe pain., Disp: 30 tablet, Rfl: 0 .  ranitidine (ZANTAC) 150 MG tablet, Take 150 mg by mouth as needed for heartburn., Disp: , Rfl:  .  tamsulosin (FLOMAX) 0.4 MG CAPS capsule, TAKE ONE CAPSULE BY MOUTH EVERY DAY, Disp: 30 capsule, Rfl: 11 .  vancomycin (VANCOCIN) 125 MG capsule, TK 1 C PO Q 6 H FOR 10 DAYS, Disp: , Rfl:  .  vancomycin  (VANCOCIN) 50 mg/mL oral solution, Take 2.5 mLs (125 mg total) by mouth every 6 (six) hours for 10 days. (Patient not taking: Reported on 11/07/2018), Disp: 100 mL, Rfl: 0    Family History  Problem Relation Age of Onset  . Hypertension Mother   . Alcohol abuse Father   . Bladder Cancer Neg Hx   . Prostate cancer Neg Hx   . Kidney cancer Neg Hx      Social History   Tobacco Use  . Smoking status: Former Research scientist (life sciences)  . Smokeless tobacco: Never Used  . Tobacco comment: In [place], quit in 1960's  Substance Use Topics  . Alcohol use: No  . Drug use: No    Allergies as of 11/07/2018 - Review Complete 11/07/2018  Allergen Reaction Noted  . Augmentin [amoxicillin-pot clavulanate] Rash 10/21/2018  . Morphine and related  10/09/2018    Review of Systems:    All systems reviewed and negative except where noted in HPI.   Physical Exam:  BP (!) 171/89 (BP Location: Left Arm, Patient Position: Sitting, Cuff Size: Normal)   Pulse 75   Ht 5\' 11"  (1.803 m)   Wt 161 lb 12.8 oz (73.4 kg)   BMI 22.57 kg/m  No LMP for male patient.  General:   Alert,  Well-developed, well-nourished, pleasant and cooperative in NAD Head:  Normocephalic and atraumatic. Eyes:  Sclera clear, no icterus.   Conjunctiva pink. Ears:  Normal auditory acuity. Nose:  No deformity, discharge, or lesions. Mouth:  No deformity or lesions,oropharynx pink & moist. Neck:  Supple; no masses or thyromegaly. Lungs:  Respirations even and unlabored.  Clear throughout to auscultation.   No wheezes, crackles, or rhonchi. No acute distress. Heart:  Regular rate and rhythm; no murmurs, clicks, rubs, or gallops. Abdomen:  Normal bowel sounds. Soft, non-tender and non-distended without masses, hepatosplenomegaly or hernias noted.  No guarding or rebound tenderness.  Midline vertical incision site with staples intact, nontender, appears healthy, 2 drains in place, one drain is empty and the other drain has amber color fluid Rectal: Not  performed Msk:  Symmetrical without gross deformities. Good, equal movement & strength bilaterally. Pulses:  Normal pulses noted. Extremities:  No clubbing or edema.  No cyanosis. Neurologic:  Alert and oriented x3;  grossly normal neurologically. Skin:  Intact without significant lesions or rashes. No jaundice. Psych:  Alert and cooperative. Normal mood and affect.  Imaging Studies: None  Assessment and Plan:   [name] is a 85  y.o. Caucasian male with history of A. fib, status post pacemaker, on Eliquis with history of tubular adenoma of colon in 2006, chronic constipation and clear mucus drainage per rectum, sigmoid volvulus on 10/07/2018 status post flex sig with decompression, persistent volvulus status post sigmoid colectomy, 39 cm of the sigmoid colon removed with primary anastomosis on 10/13/2018 is here for follow-up of dark stools that started since surgery and recent diagnosis of C. difficile diarrhea.   Dark stool Currently resolved, hemoglobin is stable No further work-up from GI standpoint  C. difficile diarrhea Complete course of vancomycin 125 mg p.o. 4 times a day for 10 days Advised him about proper sanitation and hygienic precautions  Tubular adenomas of colon Recommend surveillance colonoscopy in 09/2022   Follow up as needed  Cephas Darby, MD

## 2018-11-10 ENCOUNTER — Encounter: Payer: Self-pay | Admitting: Surgery

## 2018-11-13 ENCOUNTER — Other Ambulatory Visit: Payer: Self-pay | Admitting: Internal Medicine

## 2018-11-13 NOTE — Telephone Encounter (Signed)
Last filled 08-08-18 #60 Last OV 10-06-18 Next OV 01-12-19 [facility] [address]

## 2018-11-17 ENCOUNTER — Telehealth: Payer: Self-pay | Admitting: Gastroenterology

## 2018-11-17 NOTE — Telephone Encounter (Signed)
Patient called & l/m on v/m to call. I have called & spoke with patient. He is having stomach cramps after eating then a short time later he has a Bowel movement this has gone on for about a week. Please advise.

## 2018-11-19 MED ORDER — DICYCLOMINE HCL 10 MG PO CAPS: 10.0000 mg | ORAL_CAPSULE | Freq: Three times a day (TID) | ORAL | 0 refills | Status: DC

## 2018-11-19 NOTE — Telephone Encounter (Signed)
Returned patient's call.  He reports postprandial cramps followed by formed bowel movement for the last 1 week.  He denies diarrhea.  He finished the course of C. difficile treatment.   Reassured him that it may take few more days to go back to normal Suggested him to try probiotics We will send in prescription for Bentyl 10 mg before each meal and at bedtime as needed for abdominal cramps  Cephas Darby, MD [address]  [place], [place] [address]  Main: [phone]  Fax: [phone] Pager: [phone]

## 2018-11-19 NOTE — Telephone Encounter (Signed)
Patient called & l/m on v/m to call. I have called & spoke with patient. He is having stomach cramps after eating then a short time later he has a Bowel movement this has gone on for about a week. Please advise.

## 2018-11-25 ENCOUNTER — Telehealth: Payer: Self-pay | Admitting: Gastroenterology

## 2018-11-25 ENCOUNTER — Encounter: Payer: Self-pay | Admitting: Surgery

## 2018-11-25 NOTE — Telephone Encounter (Signed)
Patient called stating he is still having problems eating and going to the bathroom right after. He was ask to gt et a probiotic & was prescribed Dicyclomine 10 mg stating it is not working.He uses [facility] .

## 2018-11-26 ENCOUNTER — Other Ambulatory Visit: Payer: Self-pay

## 2018-11-26 DIAGNOSIS — A09 Infectious gastroenteritis and colitis, unspecified: Secondary | ICD-10-CM

## 2018-11-26 NOTE — Telephone Encounter (Signed)
Stool studies have been ordered, pt will come to clinic to pick up stool kit

## 2018-12-04 ENCOUNTER — Telehealth: Payer: Self-pay | Admitting: Internal Medicine

## 2018-12-04 NOTE — Telephone Encounter (Signed)
Spoke with Morey Hummingbird at [place]. Advised that I released patient in [place]. He is following with MD at the [facility] now.  Marked inactive in [employer]. [place] recall deleted (overdue as of 04/2017).

## 2018-12-04 NOTE — Telephone Encounter (Signed)
Pt last seen with Dr. Caryl Comes 2018.

## 2018-12-04 NOTE — Telephone Encounter (Signed)
New message   [name] from [place] is calling to release patient  from the [place] website if the patient is on the website. If released needs to go to [facility] at the [place].

## 2019-01-12 ENCOUNTER — Encounter: Payer: Medicare Other | Admitting: Internal Medicine

## 2019-01-15 ENCOUNTER — Other Ambulatory Visit: Payer: Self-pay | Admitting: Internal Medicine

## 2019-01-15 NOTE — Telephone Encounter (Signed)
Last filled 11-13-18 #30 Last OV Acute 10-06-18 No Future OV [facility] [address]

## 2019-04-29 ENCOUNTER — Other Ambulatory Visit: Payer: Self-pay | Admitting: Internal Medicine

## 2019-04-29 NOTE — Telephone Encounter (Signed)
Please set up Medicare wellness in the next few months

## 2019-04-29 NOTE — Telephone Encounter (Signed)
Last filled 01-15-19 #60 Last OV Acute 10-06-18 No Future OV [facility] [address]

## 2019-05-01 NOTE — Telephone Encounter (Signed)
Spoke to pt. He wanted to make an OV to discuss some ongoing abdominal pain he is having. He has an appt 05-20-19 for that. While he is here, he will schedule his medicare wellness.

## 2019-05-08 ENCOUNTER — Ambulatory Visit (INDEPENDENT_AMBULATORY_CARE_PROVIDER_SITE_OTHER): Payer: Medicare Other | Admitting: Gastroenterology

## 2019-05-08 ENCOUNTER — Encounter: Payer: Self-pay | Admitting: Gastroenterology

## 2019-05-08 ENCOUNTER — Other Ambulatory Visit: Payer: Self-pay

## 2019-05-08 VITALS — BP 117/71 | HR 62 | Temp 97.6°F | Resp 16 | Ht 71.0 in | Wt 164.4 lb

## 2019-05-08 DIAGNOSIS — K5909 Other constipation: Secondary | ICD-10-CM | POA: Diagnosis not present

## 2019-05-08 NOTE — Progress Notes (Signed)
adem  Cephas Darby, MD [place]  [place], [place] [address]  Main: [phone]  Fax: [phone]    Gastroenterology Consultation  Referring Provider:     [name], MD Primary Care Physician:  [name], MD Primary Gastroenterologist:  Dr. Cephas Darby Reason for Consultation: Chronic constipation        HPI:   [name] is a 85 y.o. Caucasian male referred by Dr. [name], MD  for consultation & management of chronic rectal discharge.  Patient has history of A. fib, pacemaker on Eliquis, very active, functional independent who has been experiencing drainage of clear mucus per rectum. He has been suffering from constipation for several years, takes laxatives as needed.  Bowel movements are infrequent, every 2 to 3 days, generally hard associated with straining.  He tries to incorporate more fiber in his diet, has tried fiber supplements as well as stool softeners with not much relief.  He he has been noticing only clear mucus drainage per rectum.  Denies any fecal incontinence, diarrhea or rectal bleeding.  His weight has been stable.  He also reports intermittent bloating He is status post cholecystectomy in 2014  Follow-up visit 10/24/2018 After the colonoscopy, 3 weeks later patient developed sigmoid volvulus for which he was admitted to [facility].  He underwent flexible sigmoidoscopy with decompression on 10/07/2018.  2 days later, patient had persistent pain and CT revealed persistent changes of his sigmoid volvulus on 1/2 and 1/6 scans.  He underwent sigmoid colectomy, 39 cm of the sigmoid colon removed by Dr. Rosana Hoes and Dr. Hampton Abbot with primary anastomosis and JP drain placement.  Patient had uncomplicated postop recovery.  Patient was seen by Dr. Hampton Abbot as a surgery follow-up on 10/21/2018.  He reported dark stool, soft in consistency without any blood mixed with it.  Patient reported that he started noticing that stools are darker since  postop day 1.  Hemoglobin at Dr. Mont Dutton office was 12.4 improved from 10.6 during hospital stay.  Patient was started on over-the-counter Prilosec due to possibility of stress ulcers that have been causing dark stool.  Patient is here to discuss about the ongoing dark stool.  He reports doing well overall, recovering, denies any fatigue, good appetite, denies epigastric pain, reports 2-3 soft dark stools daily but denies dark red or bright red blood or black tarry stool He is taking Eliquis daily.  Follow-up visit 11/07/2018 Patient had worsening of diarrhea with lower abdominal cramps and went to ER on 11/01/2018, stool studies positive for C. difficile antigen and toxin.  He was started on oral vancomycin 125 mg p.o. for 10 days.  Patient reports that his symptoms have resolved, currently having formed bowel movements and no longer dark.  His hemoglobin is stable.  He denies any other symptoms today  Follow-up visit 05/08/2019 Patient is here to discuss about recurrence of constipation that started about a month ago.  He underwent sigmoid colectomy for volvulus and he recovered well, he felt his bowel movements are back to normal but he started noticing irregular bowel habits since June.  He is currently having bowel movements about every 4 days.  He has been suffering from chronic constipation in his life.  He is physically active, plays golf and tries to drink plenty of water.  He used to take fiber supplements and MiraLAX in the past.  He reports lower abdominal discomfort, gassy feeling almost on a regular basis after meal  NSAIDs:  None  Antiplts/Anticoagulants/Anti thrombotics: Eliquis with history of A. fib  GI Procedures:  Colonoscopy 09/15/2018 - Hemorrhoids found on perianal exam. - One diminutive polyp in the cecum, removed with a cold biopsy forceps. Resected and retrieved. - Two 5 mm polyps in the transverse colon, removed with a cold snare. Resected and retrieved. - One 5 mm polyp in  the ascending colon, removed with a cold snare. Resected and retrieved. - One 5 mm polyp in the descending colon, removed with a cold snare. Resected and retrieved. - One 7 mm polyp in the rectum, removed with a hot snare. Resected and retrieved. - Non-bleeding external and internal hemorrhoids.  DIAGNOSIS:  A. COLON POLYP, CECUM; BIOPSY:  - 1 TUBULAR ADENOMA AND A FRAGMENT OF BENIGN COLONIC MUCOSA.  - MELANOSIS COLI.  - NEGATIVE FOR HIGH-GRADE DYSPLASIA AND MALIGNANCY.   B. COLON POLYP X2, TRANSVERSE; BIOPSY:  - TUBULAR ADENOMA (2 FRAGMENTS).  - NEGATIVE FOR HIGH-GRADE DYSPLASIA AND MALIGNANCY.   C. COLON POLYP, ASCENDING; BIOPSY:  - SESSILE SERRATED POLYP (3 FRAGMENTS).  - MELANOSIS COLI.  -NEGATIVE FOR HIGH-GRADE DYSPLASIA AND MALIGNANCY.   D. COLON POLYP, DESCENDING; BIOPSY:  - FECAL MATERIAL ONLY.   E. RECTUM, POLYP; BIOPSY:  - TUBULAR ADENOMA.  - NEGATIVE FOR HIGH-GRADE DYSPLASIA AND MALIGNANCY.   Colonoscopy in 2006, tubular adenoma COLON, POLYP(S): ADENOMATOUS POLYP(S). NO HIGH GRADE DYSPLASIA OR INVASIVE MALIGNANCY IDENTIFIED.  COMMENT There is adenoma with nuclear stratification and decreased mucin. High grade dysplasia or invasive malignancy are not identified.  Past Medical History:  Diagnosis Date   Acute diverticulitis 12/15   [facility]   Acute diverticulitis    Allergic rhinitis    Atrial fibrillation or flutter 12/10   Has been paroxysmal. Patient has not tolerated coumadin due to headaches. ECHO (12/10) Mild LVH, EF 55%,    BPH (benign prostatic hyperplasia)    Chest pain 12/10   Lexiscan myoview with EF 55%, no ischemia or infarction   Chronic constipation 01/10/2015   Dysrhythmia    AFIB   HOH (hard of hearing)    Hyperlipemia    Hypertension    Osteoarthritis    Pacemaker  medtronic 02/06/2010   Qualifier: Diagnosis of  By: Lovena Le, MD, Martyn Malay    Presence of permanent cardiac pacemaker    Sick sinus syndrome ([place])     S/P dual chamber Medtronic PCM   Sigmoid volvulus [facility])     Past Surgical History:  Procedure Laterality Date   CATARACT EXTRACTION W/PHACO Left 01/16/2017   Procedure: CATARACT EXTRACTION PHACO AND INTRAOCULAR LENS PLACEMENT ([place]);  Surgeon: Estill Cotta, MD;  Location: [facility] ORS;  Service: Ophthalmology;  Laterality: Left;  [place] 1:51.3 AP% 25.9 CDE 54.80 FLUID PACK LOT # [id number]   COLONOSCOPY WITH PROPOFOL N/A 09/15/2018   Procedure: COLONOSCOPY WITH PROPOFOL;  Surgeon: Lin Landsman, MD;  Location: [facility] ENDOSCOPY;  Service: Gastroenterology;  Laterality: N/A;   dual chamber pacemaker  01/11   Dr. Lynann Beaver SIGMOIDOSCOPY N/A 10/07/2018   Procedure: FLEXIBLE SIGMOIDOSCOPY;  Surgeon: Virgel Manifold, MD;  Location: [facility] ENDOSCOPY;  Service: Endoscopy;  Laterality: N/A;   LAPAROSCOPIC CHOLECYSTECTOMY  1/14   Dr Bary Castilla   PARTIAL COLECTOMY N/A 10/13/2018   Procedure: LAPAROTOMY, LEFT COLECTOMY,;  Surgeon: Vickie Epley, MD;  Location: [facility] ORS;  Service: General;  Laterality: N/A;   VASECTOMY  in his 3's    Current Outpatient Medications:    acetaminophen (TYLENOL) 325 MG tablet, Take 650 mg by mouth  every 6 (six) hours as needed. Reported on 01/17/2016, Disp: , Rfl:    ALPRAZolam (XANAX) 0.25 MG tablet, TAKE 1 TABLET BY MOUTH TWICE DAILY AS NEEDED FOR ANXIETY, Disp: 60 tablet, Rfl: 0   apixaban (ELIQUIS) 2.5 MG TABS tablet, Take 2.5 mg by mouth 2 (two) times daily., Disp: , Rfl:    dicyclomine (BENTYL) 10 MG capsule, Take 1 capsule (10 mg total) by mouth 4 (four) times daily -  before meals and at bedtime. As needed, Disp: 15 capsule, Rfl: 0   diltiazem (TIAZAC) 180 MG 24 hr capsule, Take 180 mg by mouth daily., Disp: , Rfl:    finasteride (PROSCAR) 5 MG tablet, Take 1 tablet (5 mg total) by mouth daily., Disp: 90 tablet, Rfl: 3   flecainide (TAMBOCOR) 100 MG tablet, Take 1 tablet (100 mg total) by mouth 2 (two) times daily., Disp: 180 tablet, Rfl:  3   Glucosamine 500 MG CAPS, Take by mouth daily.  , Disp: , Rfl:    ibuprofen (ADVIL,MOTRIN) 200 MG tablet, Take 200 mg by mouth every 6 (six) hours as needed for moderate pain., Disp: , Rfl:    ondansetron (ZOFRAN ODT) 4 MG disintegrating tablet, Take 1 tablet (4 mg total) by mouth every 8 (eight) hours as needed., Disp: 20 tablet, Rfl: 0   ranitidine (ZANTAC) 150 MG tablet, Take 150 mg by mouth as needed for heartburn., Disp: , Rfl:    tamsulosin (FLOMAX) 0.4 MG CAPS capsule, TAKE ONE CAPSULE BY MOUTH EVERY DAY, Disp: 30 capsule, Rfl: 11   oxyCODONE (OXY IR/ROXICODONE) 5 MG immediate release tablet, Take 1 tablet (5 mg total) by mouth every 4 (four) hours as needed for severe pain. (Patient not taking: Reported on 05/08/2019), Disp: 30 tablet, Rfl: 0   vancomycin (VANCOCIN) 125 MG capsule, TK 1 C PO Q 6 H FOR 10 DAYS, Disp: , Rfl:     Family History  Problem Relation Age of Onset   Hypertension Mother    Alcohol abuse Father    Bladder Cancer Neg Hx    Prostate cancer Neg Hx    Kidney cancer Neg Hx      Social History   Tobacco Use   Smoking status: Former Smoker   Smokeless tobacco: Never Used   Tobacco comment: In [place] [place], quit in 1960's  Substance Use Topics   Alcohol use: No   Drug use: No    Allergies as of 05/08/2019 - Review Complete 05/08/2019  Allergen Reaction Noted   Augmentin [amoxicillin-pot clavulanate] Rash 10/21/2018   Morphine and related  10/09/2018    Review of Systems:    All systems reviewed and negative except where noted in HPI.   Physical Exam:  BP 117/71 (BP Location: Left Arm, Patient Position: Sitting, Cuff Size: Normal)    Pulse 62    Temp 97.6 F (36.4 C)    Resp 16    Ht 5\' 11"  (1.803 m)    Wt 164 lb 6.4 oz (74.6 kg)    BMI 22.93 kg/m  No LMP for male patient.  General:   Alert,  Well-developed, well-nourished, pleasant and cooperative in NAD Head:  Normocephalic and atraumatic. Eyes:  Sclera clear, no icterus.    Conjunctiva pink. Ears:  Normal auditory acuity. Nose:  No deformity, discharge, or lesions. Mouth:  No deformity or lesions,oropharynx pink & moist. Neck:  Supple; no masses or thyromegaly. Lungs:  Respirations even and unlabored.  Clear throughout to auscultation.   No wheezes, crackles, or rhonchi. No  acute distress. Heart:  Regular rate and rhythm; no murmurs, clicks, rubs, or gallops. Abdomen:  Normal bowel sounds. Soft, non-tender and non-distended without masses, hepatosplenomegaly or hernias noted.  No guarding or rebound tenderness.  Well-healed scar from recent surgery Rectal: Not performed Msk:  Symmetrical without gross deformities. Good, equal movement & strength bilaterally. Pulses:  Normal pulses noted. Extremities:  No clubbing or edema.  No cyanosis. Neurologic:  Alert and oriented x3;  grossly normal neurologically. Skin:  Intact without significant lesions or rashes. No jaundice. Psych:  Alert and cooperative. Normal mood and affect.  Imaging Studies: None  Assessment and Plan:   [name] is a 85 y.o. Caucasian male with history of A. fib, status post pacemaker, on Eliquis with history of tubular adenoma of colon in 2006, chronic constipation and clear mucus drainage per rectum, sigmoid volvulus on 10/07/2018 status post flex sig with decompression, persistent volvulus status post sigmoid colectomy, 39 cm of the sigmoid colon removed with primary anastomosis on 10/13/2018, history of C. difficile status post treatment with 10 days course of vancomycin.  He is here for follow-up to discuss about constipation  Chronic constipation Given him information about high-fiber diet Trial of Trulance Incorporate fiber supplement such as Metamucil, fiber choice etc. Adequate intake of water  Tubular adenomas of colon Recommend surveillance colonoscopy in 09/2022   Follow up in 76month  Cephas Darby, MD

## 2019-05-20 ENCOUNTER — Encounter: Payer: Self-pay | Admitting: Internal Medicine

## 2019-05-20 ENCOUNTER — Other Ambulatory Visit: Payer: Self-pay

## 2019-05-20 ENCOUNTER — Ambulatory Visit (INDEPENDENT_AMBULATORY_CARE_PROVIDER_SITE_OTHER): Payer: Medicare Other | Admitting: Internal Medicine

## 2019-05-20 DIAGNOSIS — R109 Unspecified abdominal pain: Secondary | ICD-10-CM

## 2019-05-20 DIAGNOSIS — R200 Anesthesia of skin: Secondary | ICD-10-CM | POA: Diagnosis not present

## 2019-05-20 NOTE — Progress Notes (Signed)
Subjective:    Patient ID: [name], male    DOB: 16-Jul-1933, 85 y.o.   MRN: [id number]  HPI Visit due to ongoing abdominal symptoms  Recent visit with GI---mostly constipation Rx--trulance. It works very quickly and he has to go right away Only could take it one day  Gets some pain in stomach after eating--not all the time Feels like gas---gas-x helps  Also with hand numbness Even just sitting watching TV Better if he rubs them No pain Happens at night sometimes also No hand weakness--doesn't happen when working or using hands  Current Outpatient Medications on File Prior to Visit  Medication Sig Dispense Refill  . acetaminophen (TYLENOL) 325 MG tablet Take 650 mg by mouth every 6 (six) hours as needed. Reported on 01/17/2016    . ALPRAZolam (XANAX) 0.25 MG tablet TAKE 1 TABLET BY MOUTH TWICE DAILY AS NEEDED FOR ANXIETY 60 tablet 0  . apixaban (ELIQUIS) 2.5 MG TABS tablet Take 2.5 mg by mouth 2 (two) times daily.    [name] diltiazem (TIAZAC) 180 MG 24 hr capsule Take 180 mg by mouth daily.    . finasteride (PROSCAR) 5 MG tablet Take 1 tablet (5 mg total) by mouth daily. 90 tablet 3  . flecainide (TAMBOCOR) 100 MG tablet Take 1 tablet (100 mg total) by mouth 2 (two) times daily. 180 tablet 3  . Glucosamine 500 MG CAPS Take by mouth daily.      [name] ibuprofen (ADVIL,MOTRIN) 200 MG tablet Take 200 mg by mouth every 6 (six) hours as needed for moderate pain.    . tamsulosin (FLOMAX) 0.4 MG CAPS capsule TAKE ONE CAPSULE BY MOUTH EVERY DAY 30 capsule 11   No current facility-administered medications on file prior to visit.     Allergies  Allergen Reactions  . Augmentin [Amoxicillin-Pot Clavulanate] Rash  . Morphine And Related     Shaking, back pain.    Past Medical History:  Diagnosis Date  . Acute diverticulitis 12/15   [facility]  . Acute diverticulitis   . Allergic rhinitis   . Atrial fibrillation or flutter 12/10   Has been paroxysmal. Patient has not tolerated coumadin due  to headaches. ECHO (12/10) Mild LVH, EF 55%,   . BPH (benign prostatic hyperplasia)   . Chest pain 12/10   Lexiscan myoview with EF 55%, no ischemia or infarction  . Chronic constipation 01/10/2015  . Dysrhythmia    AFIB  . HOH (hard of hearing)   . Hyperlipemia   . Hypertension   . Osteoarthritis   . Pacemaker  medtronic 02/06/2010   Qualifier: Diagnosis of  By: Lovena Le, MD, [facility], Binnie Kand   . Presence of permanent cardiac pacemaker   . Sick sinus syndrome ([facility])    S/P dual chamber Medtronic PCM  . Sigmoid volvulus [facility])     Past Surgical History:  Procedure Laterality Date  . CATARACT EXTRACTION W/PHACO Left 01/16/2017   Procedure: CATARACT EXTRACTION PHACO AND INTRAOCULAR LENS PLACEMENT ([facility]);  Surgeon: Estill Cotta, MD;  Location: [facility] ORS;  Service: Ophthalmology;  Laterality: Left;  [place] 1:51.3 AP% 25.9 CDE 54.80 FLUID PACK LOT # [id number] H  . COLONOSCOPY WITH PROPOFOL N/A 09/15/2018   Procedure: COLONOSCOPY WITH PROPOFOL;  Surgeon: Lin Landsman, MD;  Location: [facility] ENDOSCOPY;  Service: Gastroenterology;  Laterality: N/A;  . dual chamber pacemaker  01/11   Dr. Lovena Le  . FLEXIBLE SIGMOIDOSCOPY N/A 10/07/2018   Procedure: FLEXIBLE SIGMOIDOSCOPY;  Surgeon: Virgel Manifold, MD;  Location: [facility] ENDOSCOPY;  Service: Endoscopy;  Laterality: N/A;  . LAPAROSCOPIC CHOLECYSTECTOMY  1/14   Dr Bary Castilla  . PARTIAL COLECTOMY N/A 10/13/2018   Procedure: LAPAROTOMY, LEFT COLECTOMY,;  Surgeon: Vickie Epley, MD;  Location: [facility] ORS;  Service: General;  Laterality: N/A;  . VASECTOMY  in his 52's    Family History  Problem Relation Age of Onset  . Hypertension Mother   . Alcohol abuse Father   . Bladder Cancer Neg Hx   . Prostate cancer Neg Hx   . Kidney cancer Neg Hx     Social History   Socioeconomic History  . Marital status: Married    Spouse name: Not on file  . Number of children: 3  . Years of education: Not on file  . Highest education level: Not on file   Occupational History  . Occupation: Retired    Fish farm manager: RETIRED    Comment: Arboriculturist  . Financial resource strain: Not on file  . Food insecurity    Worry: Not on file    Inability: Not on file  . Transportation needs    Medical: Not on file    Non-medical: Not on file  Tobacco Use  . Smoking status: Former Research scientist (life sciences)  . Smokeless tobacco: Never Used  . Tobacco comment: In [place], quit in 1960's  Substance and Sexual Activity  . Alcohol use: No  . Drug use: No  . Sexual activity: Yes  Lifestyle  . Physical activity    Days per week: Not on file    Minutes per session: Not on file  . Stress: Not on file  Relationships  . Social Herbalist on phone: Not on file    Gets together: Not on file    Attends religious service: Not on file    Active member of club or organization: Not on file    Attends meetings of clubs or organizations: Not on file    Relationship status: Not on file  . Intimate partner violence    Fear of current or ex partner: Not on file    Emotionally abused: Not on file    Physically abused: Not on file    Forced sexual activity: Not on file  Other Topics Concern  . Not on file  Social History Narrative   2 Daughters, one son killed in [place]      Has living will   Wife, then daughter Maudie Mercury, is health care POA   Would accept resuscitation attempts but no prolonged life support.   No tube feeding if cognitively unaware   Review of Systems  Appetite is good Weight down slightly due to heat     Objective:   Physical Exam  Constitutional: He appears well-developed. No distress.  GI: Soft. Bowel sounds are normal. He exhibits no distension. There is no abdominal tenderness. There is no rebound and no guarding.  Neurological:  Normal strength in hands           Assessment & Plan:

## 2019-05-20 NOTE — Assessment & Plan Note (Signed)
Has postprandial gas at times Simethicone helps Doesn't seem to be dairy related  Okay to use the simethicone regularly

## 2019-05-20 NOTE — Assessment & Plan Note (Signed)
Seems to be positional only No problems with weakness or symptoms when working Discussed neutral wrist position when just sitting around

## 2019-06-02 ENCOUNTER — Telehealth: Payer: Self-pay | Admitting: Gastroenterology

## 2019-06-02 ENCOUNTER — Ambulatory Visit (INDEPENDENT_AMBULATORY_CARE_PROVIDER_SITE_OTHER): Payer: Medicare Other | Admitting: Family Medicine

## 2019-06-02 ENCOUNTER — Encounter: Payer: Self-pay | Admitting: Family Medicine

## 2019-06-02 ENCOUNTER — Other Ambulatory Visit: Payer: Self-pay

## 2019-06-02 VITALS — BP 137/87 | HR 80 | Temp 98.3°F

## 2019-06-02 DIAGNOSIS — R05 Cough: Secondary | ICD-10-CM | POA: Diagnosis not present

## 2019-06-02 DIAGNOSIS — Z20822 Contact with and (suspected) exposure to covid-19: Secondary | ICD-10-CM

## 2019-06-02 DIAGNOSIS — R059 Cough, unspecified: Secondary | ICD-10-CM

## 2019-06-02 NOTE — Telephone Encounter (Signed)
Patient called & was given samples of DEXALANTE and they are working. He would like a prescription called into [facility]  In [place].

## 2019-06-02 NOTE — Assessment & Plan Note (Signed)
Likely viral URI.  Symptomatic care.  Will send for testing for coronavius.  Home isolation info given via [place].  Place on respiratory call back list.  ER precautions given.. pt risk level 5  For complications of coronavirus.

## 2019-06-02 NOTE — Progress Notes (Signed)
VIRTUAL VISIT Due to national recommendations of social distancing due to [place] 19, a virtual visit is felt to be most appropriate for this patient at this time.   I connected with the patient on 06/02/19 at  9:00 AM EDT by virtual telehealth platform and verified that I am speaking with the correct person using two identifiers.   Interactive audio and video telecommunications were attempted between this provider and patient, however failed, due to patient having technical difficulties OR patient did not have access to video capability.  We continued and completed visit with audio only.   I discussed the limitations, risks, security and privacy concerns of performing an evaluation and management service by  virtual telehealth platform and the availability of in person appointments. I also discussed with the patient that there may be a patient responsible charge related to this service. The patient expressed understanding and agreed to proceed.  Patient location: Home Provider Location: [facility] Participants: Eliezer Lofts and Brett Canales   Chief Complaint  Patient presents with  . Sore Throat  . Nasal Congestion  . Cough    History of Present Illness: Sore Throat  This is a new (scratchy throat) problem. The current episode started yesterday. The problem has been gradually worsening. Neither side of throat is experiencing more pain than the other. There has been no fever. The pain is mild. Associated symptoms include congestion and coughing. Pertinent negatives include no ear discharge, ear pain, headaches, shortness of breath or trouble swallowing. He has had no exposure to strep or mono. He has tried NSAIDs for the symptoms. The treatment provided mild relief.  Cough This is a new problem. The current episode started yesterday. The problem has been gradually worsening. The problem occurs constantly. The cough is productive of sputum. Associated symptoms include nasal congestion  and a sore throat. Pertinent negatives include no ear pain, headaches, myalgias, shortness of breath or wheezing. Associated symptoms comments: No sneeze, no post nasal drip, no itchy eyes. The symptoms are aggravated by lying down. Risk factors: nonsmoker. There is no history of asthma, COPD, emphysema, environmental allergies or pneumonia.     HX of Afib, HTN, pacemaker. He is nonsmoker.  No known exposure to corona virus but does play golf 2 times a week, no mask.  COVID 19 screen No recent travel or known exposure to [place] The importance of social distancing was discussed today.   Review of Systems  HENT: Positive for congestion and sore throat. Negative for ear discharge, ear pain and trouble swallowing.   Respiratory: Positive for cough. Negative for shortness of breath and wheezing.   Musculoskeletal: Negative for myalgias.  Neurological: Negative for headaches.  Endo/Heme/Allergies: Negative for environmental allergies.      Past Medical History:  Diagnosis Date  . Acute diverticulitis 12/15   [facility]  . Acute diverticulitis   . Allergic rhinitis   . Atrial fibrillation or flutter 12/10   Has been paroxysmal. Patient has not tolerated coumadin due to headaches. ECHO (12/10) Mild LVH, EF 55%,   . BPH (benign prostatic hyperplasia)   . Chest pain 12/10   Lexiscan myoview with EF 55%, no ischemia or infarction  . Chronic constipation 01/10/2015  . Dysrhythmia    AFIB  . HOH (hard of hearing)   . Hyperlipemia   . Hypertension   . Osteoarthritis   . Pacemaker  medtronic 02/06/2010   Qualifier: Diagnosis of  By: Lovena Le, MD, [facility], Binnie Kand   . Presence of permanent  cardiac pacemaker   . Sick sinus syndrome (HCC)    S/P dual chamber Medtronic PCM  . Sigmoid volvulus ([place])     reports that he has quit smoking. He has never used smokeless tobacco. He reports that he does not drink alcohol or use drugs.   Current Outpatient Medications:  .  acetaminophen (TYLENOL) 325 MG  tablet, Take 650 mg by mouth every 6 (six) hours as needed. Reported on 01/17/2016, Disp: , Rfl:  .  ALPRAZolam (XANAX) 0.25 MG tablet, TAKE 1 TABLET BY MOUTH TWICE DAILY AS NEEDED FOR ANXIETY, Disp: 60 tablet, Rfl: 0 .  apixaban (ELIQUIS) 2.5 MG TABS tablet, Take 2.5 mg by mouth 2 (two) times daily., Disp: , Rfl:  .  diltiazem (TIAZAC) 180 MG 24 hr capsule, Take 180 mg by mouth daily., Disp: , Rfl:  .  finasteride (PROSCAR) 5 MG tablet, Take 1 tablet (5 mg total) by mouth daily., Disp: 90 tablet, Rfl: 3 .  flecainide (TAMBOCOR) 100 MG tablet, Take 1 tablet (100 mg total) by mouth 2 (two) times daily., Disp: 180 tablet, Rfl: 3 .  Glucosamine 500 MG CAPS, Take by mouth daily.  , Disp: , Rfl:  .  ibuprofen (ADVIL,MOTRIN) 200 MG tablet, Take 200 mg by mouth every 6 (six) hours as needed for moderate pain., Disp: , Rfl:  .  tamsulosin (FLOMAX) 0.4 MG CAPS capsule, TAKE ONE CAPSULE BY MOUTH EVERY DAY, Disp: 30 capsule, Rfl: 11   Observations/Objective: Blood pressure 137/87, pulse 80, temperature 98.3 F (36.8 C), temperature source Oral.  Physical Exam  Physical Exam Constitutional:      General: The patient is not in acute distress. Speaking in complete sentences. Pulmonary:     Effort: Pulmonary effort is normal. No respiratory distress.  Neurological:     Mental Status: The patient is alert and oriented to person, place, and time.  Psychiatric:        Mood and Affect: Mood normal.        Behavior: Behavior normal.   Assessment and Plan Cough Likely viral URI.  Symptomatic care.  Will send for testing for coronavius.  Home isolation info given via Frank.  Place on respiratory call back list.  ER precautions given.. pt risk level 5  For complications of coronavirus.   Total visit time 10 minutes, > 50% spent counseling and cordinating patients care.]    I discussed the assessment and treatment plan with the patient. The patient was provided an opportunity to ask questions and all  were answered. The patient agreed with the plan and demonstrated an understanding of the instructions.   The patient was advised to call back or seek an in-person evaluation if the symptoms worsen or if the condition fails to improve as anticipated.     Eliezer Lofts, MD

## 2019-06-02 NOTE — Progress Notes (Signed)
[name] added to Respiratory Illness Call Log.

## 2019-06-02 NOTE — Patient Instructions (Addendum)
Symptomatic care.   If your COVID test is POSITIVE you may return to work/school/off isolation  if all of the following are true: 1.  10 days since symptom onset or positive COVID-19 test 2.    3 consecutive days without fever and without antipyretics 3.    You have symptom improvement [especially respiratory]) x 3 days  If your COVID test is NEGATIVE you may return to work when you are 24 hour fever free and respiratory symptoms are resolved.     Person Under Monitoring Name: [name]  Location: [place] [address]   Infection Prevention Recommendations for Individuals Confirmed to have, or Being Evaluated for, 2019 Novel Coronavirus (COVID-19) Infection Who Receive Care at Home  Individuals who are confirmed to have, or are being evaluated for, COVID-19 should follow the prevention steps below until a healthcare provider or local or state health department says they can return to normal activities.  Stay home except to get medical care You should restrict activities outside your home, except for getting medical care. Do not go to work, school, or public areas, and do not use public transportation or taxis.  Call ahead before visiting your doctor Before your medical appointment, call the healthcare provider and tell them that you have, or are being evaluated for, COVID-19 infection. This will help the healthcare provider's office take steps to keep other people from getting infected. Ask your healthcare provider to call the local or state health department.  Monitor your symptoms Seek prompt medical attention if your illness is worsening (e.g., difficulty breathing). Before going to your medical appointment, call the healthcare provider and tell them that you have, or are being evaluated for, COVID-19 infection. Ask your healthcare provider to call the local or state health department.  Wear a facemask You should wear a facemask that covers your nose  and mouth when you are in the same room with other people and when you visit a healthcare provider. People who live with or visit you should also wear a facemask while they are in the same room with you.  Separate yourself from other people in your home As much as possible, you should stay in a different room from other people in your home. Also, you should use a separate bathroom, if available.  Avoid sharing household items You should not share dishes, drinking glasses, cups, eating utensils, towels, bedding, or other items with other people in your home. After using these items, you should wash them thoroughly with soap and water.  Cover your coughs and sneezes Cover your mouth and nose with a tissue when you cough or sneeze, or you can cough or sneeze into your sleeve. Throw used tissues in a lined trash can, and immediately wash your hands with soap and water for at least 20 seconds or use an alcohol-based hand rub.  Wash your [facility] your hands often and thoroughly with soap and water for at least 20 seconds. You can use an alcohol-based hand sanitizer if soap and water are not available and if your hands are not visibly dirty. Avoid touching your eyes, nose, and mouth with unwashed hands.   Prevention Steps for Caregivers and Household Members of Individuals Confirmed to have, or Being Evaluated for, COVID-19 Infection Being Cared for in the Home  If you live with, or provide care at home for, a person confirmed to have, or being evaluated for, COVID-19 infection please follow these guidelines to prevent infection:  Follow  healthcare provider's instructions Make sure that you understand and can help the patient follow any healthcare provider instructions for all care.  Provide for the patient's basic needs You should help the patient with basic needs in the home and provide support for getting groceries, prescriptions, and other personal needs.  Monitor the patient's  symptoms If they are getting sicker, call his or her medical provider and tell them that the patient has, or is being evaluated for, COVID-19 infection. This will help the healthcare provider's office take steps to keep other people from getting infected. Ask the healthcare provider to call the local or state health department.  Limit the number of people who have contact with the patient  If possible, have only one caregiver for the patient.  Other household members should stay in another home or place of residence. If this is not possible, they should stay  in another room, or be separated from the patient as much as possible. Use a separate bathroom, if available.  Restrict visitors who do not have an essential need to be in the home.  Keep older adults, very young children, and other sick people away from the patient Keep older adults, very young children, and those who have compromised immune systems or chronic health conditions away from the patient. This includes people with chronic heart, lung, or kidney conditions, diabetes, and cancer.  Ensure good ventilation Make sure that shared spaces in the home have good air flow, such as from an air conditioner or an opened window, weather permitting.  Wash your hands often  Wash your hands often and thoroughly with soap and water for at least 20 seconds. You can use an alcohol based hand sanitizer if soap and water are not available and if your hands are not visibly dirty.  Avoid touching your eyes, nose, and mouth with unwashed hands.  Use disposable paper towels to dry your hands. If not available, use dedicated cloth towels and replace them when they become wet.  Wear a facemask and gloves  Wear a disposable facemask at all times in the room and gloves when you touch or have contact with the patient's blood, body fluids, and/or secretions or excretions, such as sweat, saliva, sputum, nasal mucus, vomit, urine, or feces.  Ensure  the mask fits over your nose and mouth tightly, and do not touch it during use.  Throw out disposable facemasks and gloves after using them. Do not reuse.  Wash your hands immediately after removing your facemask and gloves.  If your personal clothing becomes contaminated, carefully remove clothing and launder. Wash your hands after handling contaminated clothing.  Place all used disposable facemasks, gloves, and other waste in a lined container before disposing them with other household waste.  Remove gloves and wash your hands immediately after handling these items.  Do not share dishes, glasses, or other household items with the patient  Avoid sharing household items. You should not share dishes, drinking glasses, cups, eating utensils, towels, bedding, or other items with a patient who is confirmed to have, or being evaluated for, COVID-19 infection.  After the person uses these items, you should wash them thoroughly with soap and water.  Wash laundry thoroughly  Immediately remove and wash clothes or bedding that have blood, body fluids, and/or secretions or excretions, such as sweat, saliva, sputum, nasal mucus, vomit, urine, or feces, on them.  Wear gloves when handling laundry from the patient.  Read and follow directions on labels of laundry or clothing  items and detergent. In general, wash and dry with the warmest temperatures recommended on the label.  Clean all areas the individual has used often  Clean all touchable surfaces, such as counters, tabletops, doorknobs, bathroom fixtures, toilets, phones, keyboards, tablets, and bedside tables, every day. Also, clean any surfaces that may have blood, body fluids, and/or secretions or excretions on them.  Wear gloves when cleaning surfaces the patient has come in contact with.  Use a diluted bleach solution (e.g., dilute bleach with 1 part bleach and 10 parts water) or a household disinfectant with a label that says  EPA-registered for coronaviruses. To make a bleach solution at home, add 1 tablespoon of bleach to 1 quart (4 cups) of water. For a larger supply, add  cup of bleach to 1 gallon (16 cups) of water.  Read labels of cleaning products and follow recommendations provided on product labels. Labels contain instructions for safe and effective use of the cleaning product including precautions you should take when applying the product, such as wearing gloves or eye protection and making sure you have good ventilation during use of the product.  Remove gloves and wash hands immediately after cleaning.  Monitor yourself for signs and symptoms of illness Caregivers and household members are considered close contacts, should monitor their health, and will be asked to limit movement outside of the home to the extent possible. Follow the monitoring steps for close contacts listed on the symptom monitoring form.   ? If you have additional questions, contact your local health department or call the epidemiologist on call at [phone] (available 24/7). ? This guidance is subject to change. For the most up-to-date guidance from [facility], please refer to their website: [email or web address].pl

## 2019-06-03 ENCOUNTER — Telehealth: Payer: Self-pay

## 2019-06-03 LAB — NOVEL CORONAVIRUS, NAA: SARS-CoV-2, NAA: NOT DETECTED

## 2019-06-03 NOTE — Telephone Encounter (Signed)
Pt left v/m that had virtual visit on 06/02/19 and Dr Diona Browner offered med for congestion but pt declined and today pt is requesting med for congestion to [facility] [address]. Pt request cb.

## 2019-06-04 ENCOUNTER — Telehealth: Payer: Self-pay | Admitting: General Practice

## 2019-06-04 NOTE — Telephone Encounter (Signed)
I recommende mucinex DM for congestion... I offered a cough suppressant prescription for use at night... does he want that?

## 2019-06-04 NOTE — Telephone Encounter (Signed)
Pt called and left a message on VM and I called him back. He said he is pretty sure he has a sinus infection and wants an antibiotic. I told him what Dr Diona Browner said about the Mucinex. He is aware it may be late before he hears anything back. Advised him that since he talked to Dr Diona Browner, it is best for her to do the treatment.

## 2019-06-04 NOTE — Telephone Encounter (Signed)
Negative COVID results given. Patient results "NOT Detected." Caller expressed understanding. ° °

## 2019-06-05 ENCOUNTER — Other Ambulatory Visit: Payer: Self-pay

## 2019-06-05 MED ORDER — LUBIPROSTONE 24 MCG PO CAPS: 24.0000 ug | ORAL_CAPSULE | Freq: Two times a day (BID) | ORAL | 3 refills | Status: DC

## 2019-06-05 MED ORDER — AZITHROMYCIN 250 MG PO TABS: ORAL_TABLET | ORAL | 0 refills | Status: DC

## 2019-06-05 NOTE — Telephone Encounter (Signed)
Given  Only 3 days of symptoms..most likely Noncovid viral infeciton, doubt bacterial infection but as I  Cannot examine him..  I will send in a prescription for an antibiotic to use if he is not improving in 4-5 days with mucinex DM , time, symptomatic care. If still not improving.. needs to be seen at Urgent care.

## 2019-06-05 NOTE — Telephone Encounter (Signed)
Spoke with pt regarding his request for Trulance. It is not covered under his plan. Contacted Dr. Marius Ditch to see if we could switch it to Amitiza as it was recommended through his insurance. Ok'd to send Amitiza 24mcg per Dr. Marius Ditch. Pt aware of the switch due to insurance issue.

## 2019-06-05 NOTE — Telephone Encounter (Signed)
Pt is calling about Prev. Message he has not heard anything he is experiencing constipation and needs this medicine please call into   [facility] [address]  [address] .

## 2019-06-05 NOTE — Telephone Encounter (Signed)
Mr. Fetterman notified as instructed by telephone.

## 2019-06-10 ENCOUNTER — Ambulatory Visit: Payer: Medicare Other | Admitting: Gastroenterology

## 2019-06-16 ENCOUNTER — Ambulatory Visit: Payer: Medicare Other | Admitting: Gastroenterology

## 2019-06-22 ENCOUNTER — Encounter: Payer: Self-pay | Admitting: Gastroenterology

## 2019-06-22 ENCOUNTER — Ambulatory Visit: Payer: Medicare Other | Admitting: Gastroenterology

## 2019-06-23 ENCOUNTER — Ambulatory Visit: Payer: Medicare Other | Admitting: Gastroenterology

## 2019-06-26 ENCOUNTER — Encounter: Payer: Self-pay | Admitting: Internal Medicine

## 2019-06-26 ENCOUNTER — Ambulatory Visit (INDEPENDENT_AMBULATORY_CARE_PROVIDER_SITE_OTHER): Payer: Medicare Other | Admitting: Internal Medicine

## 2019-06-26 ENCOUNTER — Other Ambulatory Visit: Payer: Self-pay

## 2019-06-26 DIAGNOSIS — L03119 Cellulitis of unspecified part of limb: Secondary | ICD-10-CM | POA: Diagnosis not present

## 2019-06-26 MED ORDER — DOXYCYCLINE HYCLATE 100 MG PO TABS: 100.0000 mg | ORAL_TABLET | Freq: Two times a day (BID) | ORAL | 0 refills | Status: DC

## 2019-06-26 NOTE — Assessment & Plan Note (Signed)
Looks like insect bite but unclear Warm compresses Will Rx with doxy

## 2019-06-26 NOTE — Progress Notes (Signed)
Subjective:    Patient ID: [name], male    DOB: 1933/01/18, 85 y.o.   MRN: [id number]  HPI Here due to spot on foot--not healing  Doesn't remember any injury or bite Noticed it about 3 weeks ago On the outside of midfoot---"like a bump" Tender and may be somewhat warm  Did try some neosporin on it--didn't help  Current Outpatient Medications on File Prior to Visit  Medication Sig Dispense Refill  . acetaminophen (TYLENOL) 325 MG tablet Take 650 mg by mouth every 6 (six) hours as needed. Reported on 01/17/2016    . ALPRAZolam (XANAX) 0.25 MG tablet TAKE 1 TABLET BY MOUTH TWICE DAILY AS NEEDED FOR ANXIETY 60 tablet 0  . apixaban (ELIQUIS) 2.5 MG TABS tablet Take 2.5 mg by mouth 2 (two) times daily.    [name] diltiazem (TIAZAC) 180 MG 24 hr capsule Take 180 mg by mouth daily.    . finasteride (PROSCAR) 5 MG tablet Take 1 tablet (5 mg total) by mouth daily. 90 tablet 3  . flecainide (TAMBOCOR) 100 MG tablet Take 1 tablet (100 mg total) by mouth 2 (two) times daily. 180 tablet 3  . Glucosamine 500 MG CAPS Take by mouth daily.      [name] ibuprofen (ADVIL,MOTRIN) 200 MG tablet Take 200 mg by mouth every 6 (six) hours as needed for moderate pain.    [name] lubiprostone (AMITIZA) 24 MCG capsule Take 1 capsule (24 mcg total) by mouth 2 (two) times daily with a meal. 60 capsule 3  . tamsulosin (FLOMAX) 0.4 MG CAPS capsule TAKE ONE CAPSULE BY MOUTH EVERY DAY 30 capsule 11   No current facility-administered medications on file prior to visit.     Allergies  Allergen Reactions  . Augmentin [Amoxicillin-Pot Clavulanate] Rash  . Morphine And Related     Shaking, back pain.    Past Medical History:  Diagnosis Date  . Acute diverticulitis 12/15   [facility]  . Acute diverticulitis   . Allergic rhinitis   . Atrial fibrillation or flutter 12/10   Has been paroxysmal. Patient has not tolerated coumadin due to headaches. ECHO (12/10) Mild LVH, EF 55%,   . BPH (benign prostatic hyperplasia)   . Chest pain  12/10   Lexiscan myoview with EF 55%, no ischemia or infarction  . Chronic constipation 01/10/2015  . Dysrhythmia    AFIB  . HOH (hard of hearing)   . Hyperlipemia   . Hypertension   . Osteoarthritis   . Pacemaker  medtronic 02/06/2010   Qualifier: Diagnosis of  By: Lovena Le, MD, [facility], Binnie Kand   . Presence of permanent cardiac pacemaker   . Sick sinus syndrome (HCC)    S/P dual chamber Medtronic PCM  . Sigmoid volvulus [facility])     Past Surgical History:  Procedure Laterality Date  . CATARACT EXTRACTION W/PHACO Left 01/16/2017   Procedure: CATARACT EXTRACTION PHACO AND INTRAOCULAR LENS PLACEMENT (IOC);  Surgeon: Estill Cotta, MD;  Location: [facility] ORS;  Service: Ophthalmology;  Laterality: Left;  [place] 1:51.3 AP% 25.9 CDE 54.80 FLUID PACK LOT # [id number]  . COLONOSCOPY WITH PROPOFOL N/A 09/15/2018   Procedure: COLONOSCOPY WITH PROPOFOL;  Surgeon: Lin Landsman, MD;  Location: [facility];  Service: Gastroenterology;  Laterality: N/A;  . dual chamber pacemaker  01/11   Dr. Lovena Le  . FLEXIBLE SIGMOIDOSCOPY N/A 10/07/2018   Procedure: FLEXIBLE SIGMOIDOSCOPY;  Surgeon: Virgel Manifold, MD;  Location: [facility] ENDOSCOPY;  Service: Endoscopy;  Laterality: N/A;  . LAPAROSCOPIC CHOLECYSTECTOMY  1/14   Dr Bary Castilla  . PARTIAL COLECTOMY N/A 10/13/2018   Procedure: LAPAROTOMY, LEFT COLECTOMY,;  Surgeon: Vickie Epley, MD;  Location: [facility] ORS;  Service: General;  Laterality: N/A;  . VASECTOMY  in his 40's    Family History  Problem Relation Age of Onset  . Hypertension Mother   . Alcohol abuse Father   . Bladder Cancer Neg Hx   . Prostate cancer Neg Hx   . Kidney cancer Neg Hx     Social History   Socioeconomic History  . Marital status: Married    Spouse name: Not on file  . Number of children: 3  . Years of education: Not on file  . Highest education level: Not on file  Occupational History  . Occupation: Retired    Fish farm manager: RETIRED    Comment: Scientist, water quality  . Financial resource strain: Not on file  . Food insecurity    Worry: Not on file    Inability: Not on file  . Transportation needs    Medical: Not on file    Non-medical: Not on file  Tobacco Use  . Smoking status: Former Research scientist (life sciences)  . Smokeless tobacco: Never Used  . Tobacco comment: In [place], quit in 1960's  Substance and Sexual Activity  . Alcohol use: No  . Drug use: No  . Sexual activity: Yes  Lifestyle  . Physical activity    Days per week: Not on file    Minutes per session: Not on file  . Stress: Not on file  Relationships  . Social Herbalist on phone: Not on file    Gets together: Not on file    Attends religious service: Not on file    Active member of club or organization: Not on file    Attends meetings of clubs or organizations: Not on file    Relationship status: Not on file  . Intimate partner violence    Fear of current or ex partner: Not on file    Emotionally abused: Not on file    Physically abused: Not on file    Forced sexual activity: Not on file  Other Topics Concern  . Not on file  Social History Narrative   2 Daughters, one son killed in [place]      Has living will   Wife, then daughter Maudie Mercury, is health care POA   Would accept resuscitation attempts but no prolonged life support.   No tube feeding if cognitively unaware   Review of Systems Not sick Had fever 3-4 weeks ago---had negative COVID test then    Objective:   Physical Exam  Constitutional: He appears well-developed. No distress.  Skin:  ~9-19mm raised red area with center eschar----along lateral right foot (proximal end of 5th metatarsal) Mildly warm and tender           Assessment & Plan:

## 2019-07-14 ENCOUNTER — Other Ambulatory Visit: Payer: Self-pay | Admitting: Internal Medicine

## 2019-07-14 NOTE — Telephone Encounter (Signed)
Last filled 04-09-19 #60 Last OV 06-26-19 Next OV 08-26-19 [facility] [address]

## 2019-08-26 ENCOUNTER — Ambulatory Visit (INDEPENDENT_AMBULATORY_CARE_PROVIDER_SITE_OTHER): Payer: Medicare Other | Admitting: Internal Medicine

## 2019-08-26 ENCOUNTER — Encounter: Payer: Self-pay | Admitting: Internal Medicine

## 2019-08-26 ENCOUNTER — Other Ambulatory Visit: Payer: Self-pay

## 2019-08-26 DIAGNOSIS — I700 Atherosclerosis of aorta: Secondary | ICD-10-CM

## 2019-08-26 DIAGNOSIS — I495 Sick sinus syndrome: Secondary | ICD-10-CM | POA: Diagnosis not present

## 2019-08-26 DIAGNOSIS — N401 Enlarged prostate with lower urinary tract symptoms: Secondary | ICD-10-CM

## 2019-08-26 DIAGNOSIS — I4891 Unspecified atrial fibrillation: Secondary | ICD-10-CM

## 2019-08-26 DIAGNOSIS — N138 Other obstructive and reflux uropathy: Secondary | ICD-10-CM

## 2019-08-26 DIAGNOSIS — I10 Essential (primary) hypertension: Secondary | ICD-10-CM | POA: Diagnosis not present

## 2019-08-26 DIAGNOSIS — K219 Gastro-esophageal reflux disease without esophagitis: Secondary | ICD-10-CM

## 2019-08-26 DIAGNOSIS — Z0000 Encounter for general adult medical examination without abnormal findings: Secondary | ICD-10-CM

## 2019-08-26 DIAGNOSIS — Z7189 Other specified counseling: Secondary | ICD-10-CM

## 2019-08-26 NOTE — Progress Notes (Signed)
Subjective:    Patient ID: [name], male    DOB: Jul 22, 1933, 85 y.o.   MRN: [id number]  HPI Here for Medicare wellness visit and follow up of chronic health conditions Reviewed form and advanced directives Reviewed other doctors No alcohol or tobacco Walks and plays golf Had partial colectomy last winter---recovered. Cataract in right eye recently--taking it easy for now Vision is improved Hearing aides --they work well No falls Independent with instrumental ADLs No sig memory problems  Ongoing digestive problems Mostly constipation--uses various laxatives. miralax not that good Had C diff---but clearly no ongoing issues Dulcolax works--but gives him cramps  Now gets pacemaker checked at [facility] No palpitations No chest pain or SOB No dizziness or syncope No edema Known aortic atherosclerosis---seen on abdominal imaging with recent colon issues  Gets some sleep issues No depression or anhedonia Uses the xanax once in a while for sleep Has grandson living with him temporarily while building a house on adjacent property  Rare heartburn--if he eats spicy food Uses rolaids No dysphagia  Nocturia x 2 Flow is okay in day No significant daytime urgency or other problems   Current Outpatient Medications on File Prior to Visit  Medication Sig Dispense Refill  . acetaminophen (TYLENOL) 325 MG tablet Take 650 mg by mouth every 6 (six) hours as needed. Reported on 01/17/2016    . ALPRAZolam (XANAX) 0.25 MG tablet TAKE 1 TABLET BY MOUTH TWICE DAILY AS NEEDED FOR ANXIETY 60 tablet 0  . apixaban (ELIQUIS) 2.5 MG TABS tablet Take 2.5 mg by mouth 2 (two) times daily.    [name] diltiazem (TIAZAC) 180 MG 24 hr capsule Take 180 mg by mouth daily.    . finasteride (PROSCAR) 5 MG tablet Take 1 tablet (5 mg total) by mouth daily. 90 tablet 3  . flecainide (TAMBOCOR) 100 MG tablet Take 1 tablet (100 mg total) by mouth 2 (two) times daily. 180 tablet 3  . Glucosamine 500 MG CAPS Take by mouth  daily.      [name] ibuprofen (ADVIL,MOTRIN) 200 MG tablet Take 200 mg by mouth every 6 (six) hours as needed for moderate pain.    . tamsulosin (FLOMAX) 0.4 MG CAPS capsule TAKE ONE CAPSULE BY MOUTH EVERY DAY 30 capsule 11   No current facility-administered medications on file prior to visit.     Allergies  Allergen Reactions  . Augmentin [Amoxicillin-Pot Clavulanate] Rash  . Morphine And Related     Shaking, back pain.    Past Medical History:  Diagnosis Date  . Acute diverticulitis 12/15   [facility]  . Acute diverticulitis   . Allergic rhinitis   . Atrial fibrillation or flutter 12/10   Has been paroxysmal. Patient has not tolerated coumadin due to headaches. ECHO (12/10) Mild LVH, EF 55%,   . BPH (benign prostatic hyperplasia)   . Chest pain 12/10   Lexiscan myoview with EF 55%, no ischemia or infarction  . Chronic constipation 01/10/2015  . Dysrhythmia    AFIB  . HOH (hard of hearing)   . Hyperlipemia   . Hypertension   . Osteoarthritis   . Pacemaker  medtronic 02/06/2010   Qualifier: Diagnosis of  By: Lovena Le, MD, [facility], Binnie Kand   . Presence of permanent cardiac pacemaker   . Sick sinus syndrome (HCC)    S/P dual chamber Medtronic PCM  . Sigmoid volvulus [facility])     Past Surgical History:  Procedure Laterality Date  . CATARACT EXTRACTION W/PHACO Left 01/16/2017   Procedure:  CATARACT EXTRACTION PHACO AND INTRAOCULAR LENS PLACEMENT (IOC);  Surgeon: Estill Cotta, MD;  Location: [facility] ORS;  Service: Ophthalmology;  Laterality: Left;  [place] 1:51.3 AP% 25.9 CDE 54.80 FLUID PACK LOT # [id number]  . COLONOSCOPY WITH PROPOFOL N/A 09/15/2018   Procedure: COLONOSCOPY WITH PROPOFOL;  Surgeon: Lin Landsman, MD;  Location: [facility] ENDOSCOPY;  Service: Gastroenterology;  Laterality: N/A;  . dual chamber pacemaker  01/11   Dr. Lovena Le  . FLEXIBLE SIGMOIDOSCOPY N/A 10/07/2018   Procedure: FLEXIBLE SIGMOIDOSCOPY;  Surgeon: Virgel Manifold, MD;  Location: [facility] ENDOSCOPY;  Service:  Endoscopy;  Laterality: N/A;  . LAPAROSCOPIC CHOLECYSTECTOMY  1/14   Dr Bary Castilla  . PARTIAL COLECTOMY N/A 10/13/2018   Procedure: LAPAROTOMY, LEFT COLECTOMY,;  Surgeon: Vickie Epley, MD;  Location: [facility] ORS;  Service: General;  Laterality: N/A;  . VASECTOMY  in his 55's    Family History  Problem Relation Age of Onset  . Hypertension Mother   . Alcohol abuse Father   . Bladder Cancer Neg Hx   . Prostate cancer Neg Hx   . Kidney cancer Neg Hx     Social History   Socioeconomic History  . Marital status: Married    Spouse name: Not on file  . Number of children: 3  . Years of education: Not on file  . Highest education level: Not on file  Occupational History  . Occupation: Retired    Fish farm manager: RETIRED    Comment: Arboriculturist  . Financial resource strain: Not on file  . Food insecurity    Worry: Not on file    Inability: Not on file  . Transportation needs    Medical: Not on file    Non-medical: Not on file  Tobacco Use  . Smoking status: Former Research scientist (life sciences)  . Smokeless tobacco: Never Used  . Tobacco comment: In [place], quit in 1960's  Substance and Sexual Activity  . Alcohol use: No  . Drug use: No  . Sexual activity: Yes  Lifestyle  . Physical activity    Days per week: Not on file    Minutes per session: Not on file  . Stress: Not on file  Relationships  . Social Herbalist on phone: Not on file    Gets together: Not on file    Attends religious service: Not on file    Active member of club or organization: Not on file    Attends meetings of clubs or organizations: Not on file    Relationship status: Not on file  . Intimate partner violence    Fear of current or ex partner: Not on file    Emotionally abused: Not on file    Physically abused: Not on file    Forced sexual activity: Not on file  Other Topics Concern  . Not on file  Social History Narrative   2 Daughters, one son killed in [place]      Has living will   Wife,  then daughter Maudie Mercury, is health care POA   Would accept resuscitation attempts but no prolonged life support.   No tube feeding if cognitively unaware   Review of Systems Rare sinus headaches Appetite is good Weight down slightly Wears seat belt Teeth fine--keeps up with dentist Has spot behind left ear for me to check. Sees derm at [facility] No blood in stool No sig back or joint pain---uses the glucosamine and it helps    Objective:   Physical Exam  Constitutional:  He is oriented to person, place, and time. He appears well-developed. No distress.  HENT:  Mouth/Throat: Oropharynx is clear and moist. No oropharyngeal exudate.  Neck: No thyromegaly present.  Cardiovascular: Normal rate, regular rhythm, normal heart sounds and intact distal pulses. Exam reveals no gallop.  No murmur heard. Respiratory: Effort normal and breath sounds normal. No respiratory distress. He has no wheezes. He has no rales.  GI: Soft. There is no abdominal tenderness.  Musculoskeletal:        General: No tenderness or edema.  Lymphadenopathy:    He has no cervical adenopathy.  Neurological: He is alert and oriented to person, place, and time.  President---"Trump, Ramonita Lab" [id number] D-l-[id number]-w Recall 3/3  Skin: No rash noted. No erythema.  Slight none inflamed scaling behind left ear--not clearly an actinic (he will watch and come back if it gets inflamed or persists)  Psychiatric: He has a normal mood and affect. His behavior is normal.           Assessment & Plan:

## 2019-08-26 NOTE — Assessment & Plan Note (Signed)
Doing okay with dual therapy

## 2019-08-26 NOTE — Assessment & Plan Note (Signed)
Pacer working fine Apparently battery life still good

## 2019-08-26 NOTE — Assessment & Plan Note (Addendum)
BP Readings from Last 3 Encounters:  08/26/19 138/84  06/26/19 116/78  06/02/19 137/87   Good control Recent labs at [facility] fine--he will try to get copy

## 2019-08-26 NOTE — Assessment & Plan Note (Signed)
Better now Uses rolaids prn only

## 2019-08-26 NOTE — Progress Notes (Signed)
Hearing Screening   125Hz  250Hz  500Hz  1000Hz  2000Hz  3000Hz  4000Hz  6000Hz  8000Hz   Right ear:           Left ear:           Comments: Has hearing aids. He is wearing them today.  Vision Screening Comments: November 2020

## 2019-08-26 NOTE — Assessment & Plan Note (Signed)
Paced On apixaban

## 2019-08-26 NOTE — Assessment & Plan Note (Signed)
I have personally reviewed the Medicare Annual Wellness questionnaire and have noted 1. The patient's medical and social history 2. Their use of alcohol, tobacco or illicit drugs 3. Their current medications and supplements 4. The patient's functional ability including ADL's, fall risks, home safety risks and hearing or visual             impairment. 5. Diet and physical activities 6. Evidence for depression or mood disorders  The patients weight, height, BMI and visual acuity have been recorded in the chart I have made referrals, counseling and provided education to the patient based review of the above and I have provided the pt with a written personalized care plan for preventive services.  I have provided you with a copy of your personalized plan for preventive services. Please take the time to review along with your updated medication list.  Doing well Stays active Had flu vaccine Consider shingrix No cancer screening due to age

## 2019-08-26 NOTE — Assessment & Plan Note (Signed)
Incidental finding on CT scans At his age, statin Rx would be controversial and he prefers not to take any other medications

## 2019-08-26 NOTE — Patient Instructions (Signed)
Please try senna-s 2 tabs once or twice a day to see if that helps your bowels.

## 2019-08-26 NOTE — Assessment & Plan Note (Signed)
See social history 

## 2019-09-30 ENCOUNTER — Emergency Department: Payer: No Typology Code available for payment source

## 2019-09-30 ENCOUNTER — Emergency Department
Admission: EM | Admit: 2019-09-30 | Discharge: 2019-09-30 | Disposition: A | Discharge status: Home / Self Care | Payer: No Typology Code available for payment source | Attending: Emergency Medicine | Admitting: Emergency Medicine

## 2019-09-30 ENCOUNTER — Other Ambulatory Visit: Payer: Self-pay

## 2019-09-30 ENCOUNTER — Encounter: Payer: Self-pay | Admitting: Emergency Medicine

## 2019-09-30 DIAGNOSIS — Z87891 Personal history of nicotine dependence: Secondary | ICD-10-CM | POA: Insufficient documentation

## 2019-09-30 DIAGNOSIS — R9431 Abnormal electrocardiogram [ECG] [EKG]: Secondary | ICD-10-CM | POA: Diagnosis not present

## 2019-09-30 DIAGNOSIS — Z950 Presence of cardiac pacemaker: Secondary | ICD-10-CM | POA: Insufficient documentation

## 2019-09-30 DIAGNOSIS — I10 Essential (primary) hypertension: Secondary | ICD-10-CM | POA: Diagnosis not present

## 2019-09-30 DIAGNOSIS — Z7901 Long term (current) use of anticoagulants: Secondary | ICD-10-CM | POA: Diagnosis not present

## 2019-09-30 DIAGNOSIS — R079 Chest pain, unspecified: Secondary | ICD-10-CM | POA: Insufficient documentation

## 2019-09-30 DIAGNOSIS — Z79899 Other long term (current) drug therapy: Secondary | ICD-10-CM | POA: Diagnosis not present

## 2019-09-30 DIAGNOSIS — R0789 Other chest pain: Secondary | ICD-10-CM | POA: Diagnosis not present

## 2019-09-30 LAB — BASIC METABOLIC PANEL
Anion gap: 10 (ref 5–15)
BUN: 22 mg/dL (ref 8–23)
CO2: 24 mmol/L (ref 22–32)
Calcium: 8.9 mg/dL (ref 8.9–10.3)
Chloride: 105 mmol/L (ref 98–111)
Creatinine, Ser: 1.22 mg/dL (ref 0.61–1.24)
GFR calc Af Amer: >60 mL/min (ref >60)
GFR calc non Af Amer: 54 mL/min — ABNORMAL LOW (ref >60)
Glucose, Bld: 144 mg/dL — ABNORMAL HIGH (ref 70–99)
Potassium: 3.7 mmol/L (ref 3.5–5.1)
Sodium: 139 mmol/L (ref 135–145)

## 2019-09-30 LAB — TROPONIN I (HIGH SENSITIVITY)
Troponin I (High Sensitivity): 5 ng/L (ref <18)
Troponin I (High Sensitivity): 6 ng/L (ref <18)

## 2019-09-30 LAB — CBC
HCT: 38.9 % — ABNORMAL LOW (ref 39.0–52.0)
Hemoglobin: 13.3 g/dL (ref 13.0–17.0)
MCH: 31.1 pg (ref 26.0–34.0)
MCHC: 34.2 g/dL (ref 30.0–36.0)
MCV: 91.1 fL (ref 80.0–100.0)
Platelets: 237 10*3/uL (ref 150–400)
RBC: 4.27 MIL/uL (ref 4.22–5.81)
RDW: 12.9 % (ref 11.5–15.5)
WBC: 6.5 10*3/uL (ref 4.0–10.5)
nRBC: 0 % (ref 0.0–0.2)

## 2019-09-30 LAB — PROTIME-INR
INR: 1.2 (ref 0.8–1.2)
Prothrombin Time: 15.3 seconds — ABNORMAL HIGH (ref 11.4–15.2)

## 2019-09-30 NOTE — ED Triage Notes (Signed)
Patient presents to the ED from [facility] for an episode of chest pain that began after patient was shoveling gravel earlier today.  Patient states chest pain felt like a tightness that lasted approx. 31min.  Patient denies chest pain now.  Patient is alert and oriented x 4 with steady gait.  No obvious distress at this time.

## 2019-09-30 NOTE — ED Provider Notes (Signed)
[facility] Emergency Department Provider Note  ____________________________________________   First MD Initiated Contact with Patient 09/30/19 1911     (approximate)  I have reviewed the triage vital signs and the nursing notes.   HISTORY  Chief Complaint Chest Pain    HPI [name] is a 85 y.o. male with paroxysmal A. fib, hypertension, hyperlipidemia, Medtronic pacemaker due to sick sinus syndrome who comes in with some chest pain.  [facility] episode of chest pain that began after patient was throwing gravel earlier today around 4.  It felt like a tightness sensation that lasted 10 minutes.  It then went away.  He endorses having a stress test work-up at the [place] 1 year ago that was negative.  No prior history of stents.  He denies any new shortness of breath and has been taking his Eliquis as prescribed.  Denies any cough or fevers.  He now feels like it is at his baseline self.  He states that he thinks he was just overdoing it and that he should not be shoveling gravel.  He states he tries to act like a younger version of himself.      Past Medical History:  Diagnosis Date  . Acute diverticulitis 12/15   [facility]  . Acute diverticulitis   . Allergic rhinitis   . Atrial fibrillation or flutter 12/10   Has been paroxysmal. Patient has not tolerated coumadin due to headaches. ECHO (12/10) Mild LVH, EF 55%,   . BPH (benign prostatic hyperplasia)   . Chest pain 12/10   Lexiscan myoview with EF 55%, no ischemia or infarction  . Chronic constipation 01/10/2015  . Dysrhythmia    AFIB  . HOH (hard of hearing)   . Hyperlipemia   . Hypertension   . Osteoarthritis   . Pacemaker  medtronic 02/06/2010   Qualifier: Diagnosis of  By: Lovena Le, MD, [facility], Binnie Kand   . Presence of permanent cardiac pacemaker   . Sick sinus syndrome (HCC)    S/P dual chamber Medtronic PCM  . Sigmoid volvulus [facility])     Patient Active Problem List    Diagnosis Date Noted  . Aortic atherosclerosis ([place]) 08/26/2019  . Bilateral hand numbness 05/20/2019  . Adenoma of colon   . Counseling regarding advanced directives 04/23/2014  . Routine general medical examination at a health care facility 04/23/2014  . Osteoarthritis of left shoulder 10/21/2013  . GERD (gastroesophageal reflux disease) 02/09/2013  . ACTINIC KERATOSIS 05/04/2010  . Pacemaker  medtronic 02/06/2010  . Sick sinus syndrome ([place]) 10/27/2009  . Atrial fibrillation ([place]) 10/06/2009  . HEPATIC CYST 10/06/2009  . Hyperlipemia 07/12/2009  . Essential hypertension, benign 07/12/2009  . ALLERGIC RHINITIS 07/12/2009  . BPH with obstruction/lower urinary tract symptoms 07/12/2009  . OSTEOARTHRITIS 07/12/2009    Past Surgical History:  Procedure Laterality Date  . CATARACT EXTRACTION W/PHACO Left 01/16/2017   Procedure: CATARACT EXTRACTION PHACO AND INTRAOCULAR LENS PLACEMENT (IOC);  Surgeon: Estill Cotta, MD;  Location: [facility] ORS;  Service: Ophthalmology;  Laterality: Left;  [place] 1:51.3 AP% 25.9 CDE 54.80 FLUID PACK LOT # [id number] H  . COLONOSCOPY WITH PROPOFOL N/A 09/15/2018   Procedure: COLONOSCOPY WITH PROPOFOL;  Surgeon: Lin Landsman, MD;  Location: [facility] ENDOSCOPY;  Service: Gastroenterology;  Laterality: N/A;  . dual chamber pacemaker  01/11   Dr. Lovena Le  . FLEXIBLE SIGMOIDOSCOPY N/A 10/07/2018   Procedure: FLEXIBLE SIGMOIDOSCOPY;  Surgeon: Virgel Manifold, MD;  Location: [facility] ENDOSCOPY;  Service: Endoscopy;  Laterality: N/A;  . LAPAROSCOPIC CHOLECYSTECTOMY  1/14   Dr Bary Castilla  . PARTIAL COLECTOMY N/A 10/13/2018   Procedure: LAPAROTOMY, LEFT COLECTOMY,;  Surgeon: Vickie Epley, MD;  Location: [facility] ORS;  Service: General;  Laterality: N/A;  . VASECTOMY  in his 68's    Prior to Admission medications   Medication Sig Start Date End Date Taking? Authorizing Provider  acetaminophen (TYLENOL) 325 MG tablet Take 650 mg by mouth every 6 (six) hours as needed.  Reported on 01/17/2016    [provider]  ALPRAZolam Duanne Moron) 0.25 MG tablet TAKE 1 TABLET BY MOUTH TWICE DAILY AS NEEDED FOR ANXIETY 07/14/19   [name], MD  apixaban (ELIQUIS) 2.5 MG TABS tablet Take 2.5 mg by mouth 2 (two) times daily.    [provider]  diltiazem (TIAZAC) 180 MG 24 hr capsule Take 180 mg by mouth daily.    [provider]  finasteride (PROSCAR) 5 MG tablet Take 1 tablet (5 mg total) by mouth daily. 09/13/17   [name], MD  flecainide (TAMBOCOR) 100 MG tablet Take 1 tablet (100 mg total) by mouth 2 (two) times daily. 12/13/14   Deboraha Sprang, MD  Glucosamine 500 MG CAPS Take by mouth daily.      [provider]  ibuprofen (ADVIL,MOTRIN) 200 MG tablet Take 200 mg by mouth every 6 (six) hours as needed for moderate pain.    [provider]  tamsulosin (FLOMAX) 0.4 MG CAPS capsule TAKE ONE CAPSULE BY MOUTH EVERY DAY 08/16/14   [name], MD    Allergies Augmentin [amoxicillin-pot clavulanate] and Morphine and related  Family History  Problem Relation Age of Onset  . Hypertension Mother   . Alcohol abuse Father   . Bladder Cancer Neg Hx   . Prostate cancer Neg Hx   . Kidney cancer Neg Hx     Social History Social History   Tobacco Use  . Smoking status: Former Research scientist (life sciences)  . Smokeless tobacco: Never Used  . Tobacco comment: In [place], quit in 1960's  Substance Use Topics  . Alcohol use: No  . Drug use: No      Review of Systems Constitutional: No fever/chills Eyes: No visual changes. ENT: No sore throat. Cardiovascular: Positive chest pain Respiratory: Denies shortness of breath. Gastrointestinal: No abdominal pain.  No nausea, no vomiting.  No diarrhea.  No constipation. Genitourinary: Negative for dysuria. Musculoskeletal: Negative for back pain. Skin: Negative for rash. Neurological: Negative for headaches, focal weakness or numbness. All other ROS  negative ____________________________________________   PHYSICAL EXAM:  VITAL SIGNS: ED Triage Vitals [09/30/19 1710]  Enc Vitals Group     BP 121/71     Pulse Rate 94     Resp 16     Temp 98.7 F (37.1 C)     Temp Source Oral     SpO2 97 %     Weight 170 lb (77.1 kg)     Height 5' 10.5" (1.791 m)     Head Circumference      Peak Flow      Pain Score 0     Pain Loc      Pain Edu?      Excl. in [place]?     Constitutional: Alert and oriented. Well appearing and in no acute distress. Eyes: Conjunctivae are normal. EOMI. Head: Atraumatic. Nose: No congestion/rhinnorhea. Mouth/Throat: Mucous membranes are moist.   Neck: No stridor. Trachea Midline. FROM Cardiovascular: Irregular. Grossly normal  heart sounds.  Good peripheral circulation. Respiratory: Normal respiratory effort.  No retractions. Lungs CTAB. Gastrointestinal: Soft and nontender. No distention. No abdominal bruits.  Musculoskeletal: No lower extremity tenderness nor edema.  No joint effusions. Neurologic:  Normal speech and language. No gross focal neurologic deficits are appreciated.  Skin:  Skin is warm, dry and intact. No rash noted. Psychiatric: Mood and affect are normal. Speech and behavior are normal. GU: Deferred   ____________________________________________   LABS (all labs ordered are listed, but only abnormal results are displayed)  Labs Reviewed  BASIC METABOLIC PANEL - Abnormal; Notable for the following components:      Result Value   Glucose, Bld 144 (*)    GFR calc non Af Amer 54 (*)    All other components within normal limits  CBC - Abnormal; Notable for the following components:   HCT 38.9 (*)    All other components within normal limits  PROTIME-INR - Abnormal; Notable for the following components:   Prothrombin Time 15.3 (*)    All other components within normal limits  TROPONIN I (HIGH SENSITIVITY)  TROPONIN I (HIGH SENSITIVITY)    ____________________________________________   ED ECG REPORT I, Vanessa Isabella, the attending physician, personally viewed and interpreted this ECG.  A. fib with a rate of 103, no ST elevation, some T wave inversions may be in V2 does have a widened QRS  Repeat EKG is still A. fib rate of 95, they were read as an acute MI but he had some artifact in the beginning that was causing a little ST elevation in 2 and 3 but otherwise looks very similar to his EKG earlier but he does have a wide qrs.  ____________________________________________  RADIOLOGY I, Vanessa [place], personally viewed and evaluated these images (plain radiographs) as part of my medical decision making, as well as reviewing the written report by the radiologist.  ED MD interpretation:  No PNA   Official radiology report(s): DG Chest 2 View  Result Date: 09/30/2019 CLINICAL DATA:  Mid chest pain this afternoon. EXAM: CHEST - 2 VIEW COMPARISON:  Radiographs 10/05/2014 and 11/06/2012. FINDINGS: The heart size and mediastinal contours are stable. There is aortic and great vessel atherosclerosis. Left subclavian pacemaker leads are unchanged at the right atrial and ventricular levels. The lungs are hyperinflated without confluent airspace opacity, edema, pleural effusion or pneumothorax. Mild degenerative changes are in the spine. There is evidence of chronic rotator cuff tears bilaterally. IMPRESSION: Stable chest. No active cardiopulmonary process. Electronically Signed   By: Richardean Sale M.D.   On: 09/30/2019 17:58    ____________________________________________   PROCEDURES  Procedure(s) performed (including Critical Care):  Procedures   ____________________________________________   INITIAL IMPRESSION / ASSESSMENT AND PLAN / ED COURSE   SELWYN VITO was evaluated in Emergency Department on 09/30/2019 for the symptoms described in the history of present illness. He was evaluated in the context of the global  COVID-19 pandemic, which necessitated consideration that the patient might be at risk for infection with the SARS-CoV-2 virus that causes COVID-19. Institutional protocols and algorithms that pertain to the evaluation of patients at risk for COVID-19 are in a state of rapid change based on information released by regulatory bodies including the CDC and federal and state organizations. These policies and algorithms were followed during the patient's care in the ED.    Most Likely DDx:  -MSK (atypical chest pain) vs stable angina vs ACS vs STEMI    DDx that was also  considered d/t potential to cause harm, but was found less likely based on history and physical (as detailed above): -PNA (no fevers, cough but CXR to evaluate) -PNX (reassured with equal b/l breath sounds, CXR to evaluate) -Symptomatic anemia (will get H&H) -Pulmonary embolism as no sob at rest, not pleuritic in nature, no hypoxia, on blood thinner -Aortic Dissection as no tearing pain and no radiation to the mid back, pulses equal -Pericarditis no rub on exam, EKG changes or hx to suggest dx -Tamponade (no notable SOB, tachycardic, hypotensive) -Esophageal rupture (no h/o diffuse vomitting/no crepitus)   Patient's labs are reassuring.  Kidney function at baseline.  White count was normal making infection less likely.  Hemoglobin was stable making anemia less likely.  Initial cardiac marker was 5.  Will get a repeat troponin given onset of his symptoms.  Patient is completely asymptomatic.Possible stable angina. Heart score 4-6.  We discussed admission for stress test versus close follow-up with his [place] heart doctor.  I discussed that I cannot predict if he has a heart attack in future and would need to come back if develops chest pain. But that if his labs are reassuring there is no evidence of a heart attack today.  He understands the risk and would prefer to follow-up outpatient giving completely asymptomatic and he says he will take  it easy until he follows up with the [facility] doctor.  Repeat EKG was being read as an acute MI but I have very low suspicion given it looks more like artifact than ST elevation in the inferior leads.  Looks similar to his prior EKG other then this artifact and he does have LBBB. I have asked the nurse to grab another one but patient had already been discharged..  Given my low suspicion for STEMI given patient had negative cardiac markers and had no chest pain and I think this is more likely artifact in nature and patient was completely chest pain-free I think reasonable for patient to follow-up with his cardiologist as originally planned. No recent EKG on file given he is seen at [facility].  I will call pt to let him know of this abnormality and again to just encourage him to return if he develops chest pain again.   Mobile phone mailbox was full. Home phone jsut came ringing busy.       ____________________________________________   FINAL CLINICAL IMPRESSION(S) / ED DIAGNOSES   Final diagnoses:  Chest pain, unspecified type     MEDICATIONS GIVEN DURING THIS VISIT:  Medications - No data to display   ED Discharge Orders    None       Note:  This document was prepared using Dragon voice recognition software and may include unintentional dictation errors.   Vanessa Muhlen[place], MD 10/01/19 2152

## 2019-09-30 NOTE — Discharge Instructions (Addendum)
Repeat trop was negative. No signs of heart attack today but this could be sign of angina.   Follow-up with your [place] cardiologist as soon as possible.  Try to take it easy the next few days.  If you develop chest discomfort again with exertion that not going away he should return to the ER for evaluation.

## 2019-12-16 ENCOUNTER — Other Ambulatory Visit: Payer: Self-pay

## 2019-12-16 MED ORDER — ALPRAZOLAM 0.25 MG PO TABS: 0.2500 mg | ORAL_TABLET | Freq: Two times a day (BID) | ORAL | 0 refills | Status: DC | PRN

## 2019-12-16 NOTE — Telephone Encounter (Signed)
Last time Xanax was refilled on 07/14/2019 #60 with 0 refill  LOV 08/26/2019 CPE

## 2020-02-29 ENCOUNTER — Ambulatory Visit (INDEPENDENT_AMBULATORY_CARE_PROVIDER_SITE_OTHER): Payer: No Typology Code available for payment source | Admitting: Internal Medicine

## 2020-02-29 ENCOUNTER — Encounter: Payer: Self-pay | Admitting: Internal Medicine

## 2020-02-29 ENCOUNTER — Other Ambulatory Visit: Payer: Self-pay

## 2020-02-29 DIAGNOSIS — R200 Anesthesia of skin: Secondary | ICD-10-CM

## 2020-02-29 NOTE — Assessment & Plan Note (Signed)
Has had in the past but now worse Not classic carpal tunnel distribution but likely from the stress on hands while mowing, etc Will check labs just in case Discussed trying cock up splint

## 2020-02-29 NOTE — Progress Notes (Signed)
Subjective:    Patient ID: [name], male    DOB: [birth year], 86 y.o.   MRN: [id number]  HPI Here due to hand numbness This visit occurred during the SARS-CoV-2 public health emergency.  Safety protocols were in place, including screening questions prior to the visit, additional usage of staff PPE, and extensive cleaning of exam room while observing appropriate contact time as indicated for disinfecting solutions.   Hands have been going numb Will happen just sitting watching TV Has occurred in the past--but very sporadic Now happening daily in the past couple of weeks If using them, not a problem Only when sitting relaxed Is happening in bed at times as well  Seems to be the whole hand Can massage and move them around and it improves  Current Outpatient Medications on File Prior to Visit  Medication Sig Dispense Refill  . acetaminophen (TYLENOL) 325 MG tablet Take 650 mg by mouth every 6 (six) hours as needed. Reported on 01/17/2016    . ALPRAZolam (XANAX) 0.25 MG tablet Take 1 tablet (0.25 mg total) by mouth 2 (two) times daily as needed. for anxiety 60 tablet 0  . apixaban (ELIQUIS) 2.5 MG TABS tablet Take 2.5 mg by mouth 2 (two) times daily.    [name] diltiazem (TIAZAC) 180 MG 24 hr capsule Take 180 mg by mouth daily.    . finasteride (PROSCAR) 5 MG tablet Take 1 tablet (5 mg total) by mouth daily. 90 tablet 3  . flecainide (TAMBOCOR) 100 MG tablet Take 1 tablet (100 mg total) by mouth 2 (two) times daily. 180 tablet 3  . Glucosamine 500 MG CAPS Take by mouth daily.      . tamsulosin (FLOMAX) 0.4 MG CAPS capsule TAKE ONE CAPSULE BY MOUTH EVERY DAY 30 capsule 11   No current facility-administered medications on file prior to visit.    Allergies  Allergen Reactions  . Augmentin [Amoxicillin-Pot Clavulanate] Rash  . Morphine And Related     Shaking, back pain.    Past Medical History:  Diagnosis Date  . Acute diverticulitis 12/15   [facility]  . Acute diverticulitis   .  Allergic rhinitis   . Atrial fibrillation or flutter 12/10   Has been paroxysmal. Patient has not tolerated coumadin due to headaches. ECHO (12/10) Mild LVH, EF 55%,   . BPH (benign prostatic hyperplasia)   . Chest pain 12/10   Lexiscan myoview with EF 55%, no ischemia or infarction  . Chronic constipation 01/10/2015  . Dysrhythmia    AFIB  . HOH (hard of hearing)   . Hyperlipemia   . Hypertension   . Osteoarthritis   . Pacemaker  medtronic 02/06/2010   Qualifier: Diagnosis of  By: Lovena Le, MD, [facility], Binnie Kand   . Presence of permanent cardiac pacemaker   . Sick sinus syndrome (HCC)    S/P dual chamber Medtronic PCM  . Sigmoid volvulus [facility])     Past Surgical History:  Procedure Laterality Date  . CATARACT EXTRACTION W/PHACO Left 01/16/2017   Procedure: CATARACT EXTRACTION PHACO AND INTRAOCULAR LENS PLACEMENT (IOC);  Surgeon: Estill Cotta, MD;  Location: [facility] ORS;  Service: Ophthalmology;  Laterality: Left;  [place] 1:51.3 AP% 25.9 CDE 54.80 FLUID PACK LOT # [id number]  . COLONOSCOPY WITH PROPOFOL N/A 09/15/2018   Procedure: COLONOSCOPY WITH PROPOFOL;  Surgeon: Lin Landsman, MD;  Location[facility] ENDOSCOPY;  Service: Gastroenterology;  Laterality: N/A;  . dual chamber pacemaker  01/11   Dr. Lovena Le  . FLEXIBLE SIGMOIDOSCOPY N/A 10/07/2018  Procedure: FLEXIBLE SIGMOIDOSCOPY;  Surgeon: Virgel Manifold, MD;  Location: [facility] ENDOSCOPY;  Service: Endoscopy;  Laterality: N/A;  . LAPAROSCOPIC CHOLECYSTECTOMY  1/14   Dr Bary Castilla  . PARTIAL COLECTOMY N/A 10/13/2018   Procedure: LAPAROTOMY, LEFT COLECTOMY,;  Surgeon: Vickie Epley, MD;  Location: [facility] ORS;  Service: General;  Laterality: N/A;  . VASECTOMY  in his 46's    Family History  Problem Relation Age of Onset  . Hypertension Mother   . Alcohol abuse Father   . Bladder Cancer Neg Hx   . Prostate cancer Neg Hx   . Kidney cancer Neg Hx     Social History   Socioeconomic History  . Marital status: Married    Spouse  name: Not on file  . Number of children: 3  . Years of education: Not on file  . Highest education level: Not on file  Occupational History  . Occupation: Retired    Fish farm manager: RETIRED    Comment: Hydrologist  Tobacco Use  . Smoking status: Former Research scientist (life sciences)  . Smokeless tobacco: Never Used  . Tobacco comment: In [place], quit in 1960's  Substance and Sexual Activity  . Alcohol use: No  . Drug use: No  . Sexual activity: Yes  Other Topics Concern  . Not on file  Social History Narrative   2 Daughters, one son killed in [place]      Has living will   Wife, then daughter Maudie Mercury, is health care POA   Would accept resuscitation attempts but no prolonged life support.   No tube feeding if cognitively unaware   Social Determinants of Health   Financial Resource Strain:   . Difficulty of Paying Living Expenses:   Food Insecurity:   . Worried About Charity fundraiser in the Last Year:   . Arboriculturist in the Last Year:   Transportation Needs:   . Film/video editor (Medical):   [name] Lack of Transportation (Non-Medical):   Physical Activity:   . Days of Exercise per Week:   . Minutes of Exercise per Session:   Stress:   . Feeling of Stress :   Social Connections:   . Frequency of Communication with Friends and Family:   . Frequency of Social Gatherings with Friends and Family:   . Attends Religious Services:   . Active Member of Clubs or Organizations:   . Attends Archivist Meetings:   [name] Marital Status:   Intimate Partner Violence:   . Fear of Current or Ex-Partner:   . Emotionally Abused:   [name] Physically Abused:   . 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 No distress.  Musculoskeletal:     Comments: Normal radial pulses (very strong)  Neurological:  No hand  weakness Normal sensation in hands now           Assessment & Plan:

## 2020-02-29 NOTE — Patient Instructions (Signed)
Please get a "cock-up splint" that keeps your wrist from bending---wear it when mowing or using the backhoe--and consider it at bedtime.

## 2020-03-01 ENCOUNTER — Other Ambulatory Visit: Payer: Self-pay | Admitting: Internal Medicine

## 2020-03-01 DIAGNOSIS — E538 Deficiency of other specified B group vitamins: Secondary | ICD-10-CM

## 2020-03-01 LAB — COMPREHENSIVE METABOLIC PANEL
ALT: 12 U/L (ref 0–53)
AST: 15 U/L (ref 0–37)
Albumin: 3.8 g/dL (ref 3.5–5.2)
Alkaline Phosphatase: 60 U/L (ref 39–117)
BUN: 19 mg/dL (ref 6–23)
CO2: 29 mEq/L (ref 19–32)
Calcium: 9.1 mg/dL (ref 8.4–10.5)
Chloride: 105 mEq/L (ref 96–112)
Creatinine, Ser: 1.17 mg/dL (ref 0.40–1.50)
GFR: 59.23 mL/min — ABNORMAL LOW (ref >60.00)
Glucose, Bld: 90 mg/dL (ref 70–99)
Potassium: 3.9 mEq/L (ref 3.5–5.1)
Sodium: 141 mEq/L (ref 135–145)
Total Bilirubin: 0.5 mg/dL (ref 0.2–1.2)
Total Protein: 6.9 g/dL (ref 6.0–8.3)

## 2020-03-01 LAB — CBC
HCT: 37.2 % — ABNORMAL LOW (ref 39.0–52.0)
Hemoglobin: 12.7 g/dL — ABNORMAL LOW (ref 13.0–17.0)
MCHC: 34.1 g/dL (ref 30.0–36.0)
MCV: 92.5 fl (ref 78.0–100.0)
Platelets: 257 10*3/uL (ref 150.0–400.0)
RBC: 4.02 Mil/uL — ABNORMAL LOW (ref 4.22–5.81)
RDW: 13.5 % (ref 11.5–15.5)
WBC: 4.9 10*3/uL (ref 4.0–10.5)

## 2020-03-01 LAB — T4, FREE: Free T4: 0.69 ng/dL (ref 0.60–1.60)

## 2020-03-01 LAB — VITAMIN B12: Vitamin B-12: 146 pg/mL — ABNORMAL LOW (ref 211–911)

## 2020-04-13 ENCOUNTER — Other Ambulatory Visit: Payer: Self-pay

## 2020-04-13 ENCOUNTER — Other Ambulatory Visit (INDEPENDENT_AMBULATORY_CARE_PROVIDER_SITE_OTHER): Payer: Medicare Other

## 2020-04-13 DIAGNOSIS — E538 Deficiency of other specified B group vitamins: Secondary | ICD-10-CM | POA: Diagnosis not present

## 2020-04-13 LAB — VITAMIN B12: Vitamin B-12: 307 pg/mL (ref 211–911)

## 2020-05-11 ENCOUNTER — Other Ambulatory Visit: Payer: Self-pay | Admitting: Internal Medicine

## 2020-05-11 NOTE — Telephone Encounter (Signed)
Last filled 12-16-19 #60 Last OV 02-29-20 Next OV 08-26-20 [facility] [address] [facility] [employer]

## 2020-08-26 ENCOUNTER — Encounter: Payer: Medicare Other | Admitting: Internal Medicine

## 2020-08-31 ENCOUNTER — Encounter: Payer: Medicare Other | Admitting: Internal Medicine

## 2020-11-01 ENCOUNTER — Other Ambulatory Visit: Payer: Self-pay | Admitting: Internal Medicine

## 2020-11-01 NOTE — Telephone Encounter (Signed)
Please set him up for wellness visit in June or after

## 2020-11-01 NOTE — Telephone Encounter (Signed)
Last filled 05-11-20 #60 Last OV 02-29-20 No Future OV [facility] [address] [facility] [employer]

## 2020-11-14 NOTE — Telephone Encounter (Signed)
Left message on VM asking pt to call the office and schedule [facility] exam in June or after.

## 2021-06-30 ENCOUNTER — Encounter: Payer: Self-pay | Admitting: Internal Medicine

## 2021-06-30 ENCOUNTER — Other Ambulatory Visit: Payer: Self-pay

## 2021-06-30 ENCOUNTER — Ambulatory Visit (INDEPENDENT_AMBULATORY_CARE_PROVIDER_SITE_OTHER): Payer: Medicare Other | Admitting: Internal Medicine

## 2021-06-30 VITALS — BP 128/64 | HR 78 | Temp 98.6°F | Ht 70.0 in | Wt 163.0 lb

## 2021-06-30 DIAGNOSIS — R200 Anesthesia of skin: Secondary | ICD-10-CM | POA: Diagnosis not present

## 2021-06-30 DIAGNOSIS — B070 Plantar wart: Secondary | ICD-10-CM

## 2021-06-30 DIAGNOSIS — Z23 Encounter for immunization: Secondary | ICD-10-CM

## 2021-06-30 NOTE — Progress Notes (Signed)
Subjective:    Patient ID: [name], male    DOB: 1933/04/25, 87 y.o.   MRN: [id number]  HPI Here due to right foot pain This visit occurred during the SARS-CoV-2 public health emergency.  Safety protocols were in place, including screening questions prior to the visit, additional usage of staff PPE, and extensive cleaning of exam room while observing appropriate contact time as indicated for disinfecting solutions.   Has sense that "there is something in it" Points to spot at distal 3rd metatarsal Started 3-4 weeks ago Did get arch support in shoe---helped him walk and still play golf  No other Rx  Also notices his hands go to sleep if just sitting without moving them They get better quickly if he moves them around Awakens at night with numbness as well Gets it in toes of feet also  Current Outpatient Medications on File Prior to Visit  Medication Sig Dispense Refill   acetaminophen (TYLENOL) 325 MG tablet Take 650 mg by mouth every 6 (six) hours as needed. Reported on 01/17/2016     ALPRAZolam (XANAX) 0.25 MG tablet TAKE 1 TABLET(0.25 MG) BY MOUTH TWICE DAILY AS NEEDED FOR ANXIETY 60 tablet 0   apixaban (ELIQUIS) 2.5 MG TABS tablet Take 2.5 mg by mouth 2 (two) times daily.     diltiazem (TIAZAC) 180 MG 24 hr capsule Take 180 mg by mouth daily.     finasteride (PROSCAR) 5 MG tablet Take 1 tablet (5 mg total) by mouth daily. 90 tablet 3   Glucosamine 500 MG CAPS Take by mouth daily.       tamsulosin (FLOMAX) 0.4 MG CAPS capsule TAKE ONE CAPSULE BY MOUTH EVERY DAY 30 capsule 11   flecainide (TAMBOCOR) 100 MG tablet Take 1 tablet (100 mg total) by mouth 2 (two) times daily. 180 tablet 3   No current facility-administered medications on file prior to visit.    Allergies  Allergen Reactions   Augmentin [Amoxicillin-Pot Clavulanate] Rash   Colchicine Rash   Morphine And Related     Shaking, back pain.    Past Medical History:  Diagnosis Date   Acute diverticulitis 12/15    [facility]   Acute diverticulitis    Allergic rhinitis    Atrial fibrillation or flutter 12/10   Has been paroxysmal. Patient has not tolerated coumadin due to headaches. ECHO (12/10) Mild LVH, EF 55%,    BPH (benign prostatic hyperplasia)    Chest pain 12/10   Lexiscan myoview with EF 55%, no ischemia or infarction   Chronic constipation 01/10/2015   Dysrhythmia    AFIB   HOH (hard of hearing)    Hyperlipemia    Hypertension    Osteoarthritis    Pacemaker  medtronic 02/06/2010   Qualifier: Diagnosis of  By: Lovena Le, MD, Martyn Malay    Presence of permanent cardiac pacemaker    Sick sinus syndrome ([place])    S/P dual chamber Medtronic PCM   Sigmoid volvulus [facility])     Past Surgical History:  Procedure Laterality Date   CATARACT EXTRACTION W/PHACO Left 01/16/2017   Procedure: CATARACT EXTRACTION PHACO AND INTRAOCULAR LENS PLACEMENT ([place]);  Surgeon: Estill Cotta, MD;  Location: [facility] ORS;  Service: Ophthalmology;  Laterality: Left;  [place] 1:51.3 AP% 25.9 CDE 54.80 FLUID PACK LOT # [id number] H   COLONOSCOPY WITH PROPOFOL N/A 09/15/2018   Procedure: COLONOSCOPY WITH PROPOFOL;  Surgeon: Lin Landsman, MD;  Location: [facility] ENDOSCOPY;  Service: Gastroenterology;  Laterality: N/A;   dual chamber pacemaker  01/11   Dr. Lynann Beaver SIGMOIDOSCOPY N/A 10/07/2018   Procedure: FLEXIBLE SIGMOIDOSCOPY;  Surgeon: Virgel Manifold, MD;  Location: [facility] ENDOSCOPY;  Service: Endoscopy;  Laterality: N/A;   LAPAROSCOPIC CHOLECYSTECTOMY  1/14   Dr Bary Castilla   PARTIAL COLECTOMY N/A 10/13/2018   Procedure: LAPAROTOMY, LEFT COLECTOMY,;  Surgeon: Vickie Epley, MD;  Location: [facility] ORS;  Service: General;  Laterality: N/A;   VASECTOMY  in his 42's    Family History  Problem Relation Age of Onset   Hypertension Mother    Alcohol abuse Father    Bladder Cancer Neg Hx    Prostate cancer Neg Hx    Kidney cancer Neg Hx     Social History   Socioeconomic History   Marital status: Married     Spouse name: Not on file   Number of children: 3   Years of education: Not on file   Highest education level: Not on file  Occupational History   Occupation: Retired    Fish farm manager: RETIRED    Comment: Hydrologist  Tobacco Use   Smoking status: Former   Smokeless tobacco: Never   Tobacco comments:    In [employer], quit in 1960's  Vaping Use   Vaping Use: Never used  Substance and Sexual Activity   Alcohol use: No   Drug use: No   Sexual activity: Yes  Other Topics Concern   Not on file  Social History Narrative   2 Daughters, one son killed in [place]      Has living will   Wife, then daughter Maudie Mercury, is health care POA   Would accept resuscitation attempts but no prolonged life support.   No tube feeding if cognitively unaware   Social Determinants of Health   Financial Resource Strain: Not on file  Food Insecurity: Not on file  Transportation Needs: Not on file  Physical Activity: Not on file  Stress: Not on file  Social Connections: Not on file  Intimate Partner Violence: Not on file   Review of Systems No other skin lesions     Objective:   Physical Exam Constitutional:      Appearance: Normal appearance.  Cardiovascular:     Comments: Normal pulses in hands Skin:    Comments: Wart with crusting over it ---right plantar surface  Neurological:     Mental Status: He is alert.           Assessment & Plan:

## 2021-06-30 NOTE — Assessment & Plan Note (Signed)
Discussed options--he gave verbal consent  Lesion pared with #15 blade Cryotherapy (portable unit) for 35 seconds x 2 Tolerated well Discussed home care

## 2021-06-30 NOTE — Assessment & Plan Note (Signed)
He has been on injections of B12 from the [place] His sensory symptoms persist though Could be mild carpal tunnel--but symptoms across all fingers (could be positional) In feet--only left 3rd and 4th toes  Did start using hand braces for mowing--didn't help either Will just observe for now

## 2021-07-20 ENCOUNTER — Ambulatory Visit (INDEPENDENT_AMBULATORY_CARE_PROVIDER_SITE_OTHER): Payer: Medicare Other | Admitting: Family Medicine

## 2021-07-20 ENCOUNTER — Other Ambulatory Visit: Payer: Self-pay

## 2021-07-20 VITALS — BP 152/98 | HR 85 | Temp 97.0°F | Ht 70.0 in | Wt 165.5 lb

## 2021-07-20 DIAGNOSIS — B070 Plantar wart: Secondary | ICD-10-CM

## 2021-07-20 NOTE — Patient Instructions (Signed)
Pick up Salicylic Acid - get patches/bandaid  Should say for Wart or Corn treatment on the box  Do for 2 weeks - if no improvement - return to see Dr. Silvio Pate, could consider repeat freezing  If getting better - may need to treat for up to 6-8 weeks

## 2021-07-20 NOTE — Assessment & Plan Note (Signed)
Limited improved with cryo and scraping. Advised trial of salicylic acid treatment. Pt will pick up. Discussed it could take several weeks to fully resolve but to contact if no improvement after 2-3 weeks.

## 2021-07-20 NOTE — Progress Notes (Signed)
   Subjective:     [name] is a 87 y.o. male presenting for Plantar Warts (R foot. Had previous treatment on 9/23)     HPI  #Plantar wart - had scraping and freezing on 06/30/21 - was feeling well for a few days and then came back - pain to walk on the food - right foot - has not tried otc treatment   Review of Systems   Social History   Tobacco Use  Smoking Status Former  Smokeless Tobacco Never  Tobacco Comments   In [employer], quit in 1960's        Objective:    BP Readings from Last 3 Encounters:  07/20/21 (!) 152/98  06/30/21 128/64  02/29/20 124/68   Wt Readings from Last 3 Encounters:  07/20/21 165 lb 8 oz (75.1 kg)  06/30/21 163 lb (73.9 kg)  02/29/20 170 lb 8 oz (77.3 kg)    BP (!) 152/98   Pulse 85   Temp (!) 97 F (36.1 C) (Temporal)   Ht 5\' 10"  (1.778 m)   Wt 165 lb 8 oz (75.1 kg)   SpO2 98%   BMI 23.75 kg/m    Physical Exam Constitutional:      Appearance: Normal appearance. He is not ill-appearing or diaphoretic.  HENT:     Right Ear: External ear normal.     Left Ear: External ear normal.  Eyes:     General: No scleral icterus.    Extraocular Movements: Extraocular movements intact.     Conjunctiva/sclera: Conjunctivae normal.  Cardiovascular:     Rate and Rhythm: Normal rate.     Heart sounds: No murmur heard. Pulmonary:     Effort: Pulmonary effort is normal.  Musculoskeletal:     Cervical back: Neck supple.  Skin:    General: Skin is warm and dry.     Comments: Right foot - anterior foot near he first metatarsal is a small verrucous lesion which appears to have the top layer removed. Ttp   Neurological:     Mental Status: He is alert. Mental status is at baseline.  Psychiatric:        Mood and Affect: Mood normal.          Assessment & Plan:   Problem List Items Addressed This Visit       Musculoskeletal and Integument   Plantar wart of right foot - Primary    Limited improved with cryo and scraping.  Advised trial of salicylic acid treatment. Pt will pick up. Discussed it could take several weeks to fully resolve but to contact if no improvement after 2-3 weeks.         Return if symptoms worsen or fail to improve.  Lesleigh Noe, MD  This visit occurred during the SARS-CoV-2 public health emergency.  Safety protocols were in place, including screening questions prior to the visit, additional usage of staff PPE, and extensive cleaning of exam room while observing appropriate contact time as indicated for disinfecting solutions.

## 2021-09-28 ENCOUNTER — Other Ambulatory Visit: Payer: Self-pay | Admitting: Internal Medicine

## 2021-09-28 NOTE — Telephone Encounter (Signed)
Last filled 11-01-20 #60 Last OV 69-23-22 No Future OV [facility] [address] [facility] [employer]

## 2021-10-11 ENCOUNTER — Telehealth: Payer: Self-pay | Admitting: *Deleted

## 2021-10-11 NOTE — Telephone Encounter (Signed)
PLEASE NOTE: All timestamps contained within this report are represented as [place] Standard Time. CONFIDENTIALTY NOTICE: This fax transmission is intended only for the addressee. It contains information that is legally privileged, confidential or otherwise protected from use or disclosure. If you are not the intended recipient, you are strictly prohibited from reviewing, disclosing, copying using or disseminating any of this information or taking any action in reliance on or regarding this information. If you have received this fax in error, please notify us immediately by telephone so that we can arrange for its return to [place]. Phone: [phone], Toll-Free: [phone], Fax: [phone] Page: 1 of 2 Call Id: [id number] [facility] - Client TELEPHONE ADVICE RECORD AccessNurse Patient Name: [name] Gender: Male DOB: 1933/05/14 Age: 88 Y 13 M 27 D Return Phone Number: [phone] (Primary), [phone] (Secondary) Address: [address] City/ State/ Zip: [place]  [address] Client [facility] - Client Client Site [place] - Day Provider Viviana Simpler- MD Contact Type Call Who Is Calling Patient / Member / Family / Caregiver Call Type Triage / Clinical Relationship To Patient Self Return Phone Number [phone] (Primary) Chief Complaint Numbness Reason for Call Symptomatic / Request for Health Information Initial Comment States that they've numbness in his hands and need medical advice. Translation No Nurse Assessment Nurse: Humfleet, RN, Estill Bamberg Date/Time (Eastern Time): 10/11/2021 9:07:27 AM Confirm and document reason for call. If symptomatic, describe symptoms. ---caller states his hands go numb. was just happening once in a while and now it is happening more. has talked with his pcp about it before. sometimes it takes up to 30 mins for he circulation to come back. Does the  patient have any new or worsening symptoms? ---Yes Will a triage be completed? ---Yes Related visit to physician within the last 2 weeks? ---Yes Does the PT have any chronic conditions? (i.e. diabetes, asthma, this includes High risk factors for pregnancy, etc.) ---Yes List chronic conditions. ---pacemaker Is this a behavioral health or substance abuse call? ---No Guidelines Guideline Title Affirmed Question Affirmed Notes Nurse Date/Time (Eastern Time) Neurologic Deficit [1] Numbness or tingling in one or both hands AND [2] is a chronic symptom (recurrent or ongoing AND present > 4 weeks) Humfleet, RN, Estill Bamberg 10/11/2021 9:08:09 AM Disp. Time Eilene Ghazi Time) Disposition Final User PLEASE NOTE: All timestamps contained within this report are represented as [place] Standard Time. CONFIDENTIALTY NOTICE: This fax transmission is intended only for the addressee. It contains information that is legally privileged, confidential or otherwise protected from use or disclosure. If you are not the intended recipient, you are strictly prohibited from reviewing, disclosing, copying using or disseminating any of this information or taking any action in reliance on or regarding this information. If you have received this fax in error, please notify us immediately by telephone so that we can arrange for its return to [place]. Phone: [phone], Toll-Free: [phone], Fax: [phone] Page: 2 of 2 Call Id: [id number] 10/11/2021 9:11:56 AM SEE PCP WITHIN 3 DAYS Yes Humfleet, RN, Shelly Coss Disagree/Comply Comply Caller Understands Yes PreDisposition Call Doctor Care Advice Given Per Guideline SEE PCP WITHIN 3 DAYS: * You need to be seen within 2 or 3 days. * PCP VISIT: Call your doctor (or NP/PA) during regular office hours and make an appointment. A clinic or urgent care center are good places to go for care if your doctor's office is closed or you can't get an appointment. NOTE: If  office will be  open tomorrow, tell caller to call then, not in 3 days. CARE ADVICE given per Neurologic Deficit (Adult) guideline. * You become worse CALL BACK IF: Referrals REFERRED TO PCP OFFICE

## 2021-10-11 NOTE — Telephone Encounter (Signed)
Left message for pt to see if he feels he needs to be seen sooner than the 10th. We would need to put him on at the end of the day tomorrow (Thursday) if he really needs to be seen.

## 2021-10-11 NOTE — Telephone Encounter (Signed)
Patient called the office back stating that the access nurse told him that he needed to be seen sooner than his appointment already scheduled. Patient is scheduled to see Dr. Silvio Pate 10/17/21 at 11:15. Patient stated that his symptoms have been going on for several months but has gotten worse in the past couple of weeks. Patient denies any other symptoms. Patient was given ER precautions and verbalized understanding.

## 2021-10-12 NOTE — Telephone Encounter (Signed)
Spoke to pt. He is fine with waiting for tomorrow's appointment.

## 2021-10-13 ENCOUNTER — Encounter: Payer: Self-pay | Admitting: Family

## 2021-10-13 ENCOUNTER — Ambulatory Visit (INDEPENDENT_AMBULATORY_CARE_PROVIDER_SITE_OTHER): Payer: Medicare Other | Admitting: Family

## 2021-10-13 ENCOUNTER — Other Ambulatory Visit: Payer: Self-pay

## 2021-10-13 VITALS — BP 172/94 | HR 94 | Temp 96.4°F | Ht 70.0 in | Wt 168.0 lb

## 2021-10-13 DIAGNOSIS — I4891 Unspecified atrial fibrillation: Secondary | ICD-10-CM | POA: Diagnosis not present

## 2021-10-13 DIAGNOSIS — G629 Polyneuropathy, unspecified: Secondary | ICD-10-CM

## 2021-10-13 DIAGNOSIS — R739 Hyperglycemia, unspecified: Secondary | ICD-10-CM | POA: Diagnosis not present

## 2021-10-13 DIAGNOSIS — M792 Neuralgia and neuritis, unspecified: Secondary | ICD-10-CM

## 2021-10-13 LAB — SEDIMENTATION RATE: Sed Rate: 23 mm/hr — ABNORMAL HIGH (ref 0–20)

## 2021-10-13 LAB — B12 AND FOLATE PANEL
Folate: 17.6 ng/mL (ref >5.9)
Vitamin B-12: 776 pg/mL (ref 211–911)

## 2021-10-13 LAB — TSH: TSH: 2.24 u[IU]/mL (ref 0.35–5.50)

## 2021-10-13 LAB — HEMOGLOBIN A1C: Hgb A1c MFr Bld: 5.5 % (ref 4.6–6.5)

## 2021-10-13 NOTE — Patient Instructions (Signed)
A referral was placed today for neurology for workup on your hand numbness.  Please let us know if you have not heard back within 1 week about your referral.  Stop by the lab prior to leaving today. I will notify you of your results once received.   I do suggest you start wearing your wrist braces throughout the night as well as while mowing the lawn or playing golf as this may reduce neuropathic pain.  It was a pleasure seeing you today! Please do not hesitate to reach out with any questions and or concerns.  Regards,   Eugenia Pancoast FNP-C

## 2021-10-13 NOTE — Progress Notes (Signed)
Established Patient Office Visit  Subjective:  Patient ID: [name], male    DOB: 05/31/1933  Age: 88 y.o. MRN: [id number]  CC:  Chief Complaint  Patient presents with   Numbness   Tingling    HPI [name] is here today with c/o bil hand neuralgia, that will feel numb intermittently and 'fall asleep'. Throughout the day as long as he is being active he doesn't feel it as much but as soon as he sits down or lies down they go numb intermittently.   Reports no weakness in grip, however, can't feel whats in hand if it is numb at the time. He finds that if he rubs his arms they will improve with the numbness. Tips of fingers feel needle like.   Did smoke cigarettes 60 years ago, for only a short period of time.   Last visit, pcp suggested possible carpal tunnel, he did try using hand braces for mowing especially in the summer but with no improvement. He does take oral b12 , no longer with injections from the [place]. He states [facility] levels [facility] have increased (unable to pull up lab results), and so they stopped IM injections. He does recall the last b12 level.   Past Medical History:  Diagnosis Date   Acute diverticulitis 12/15   [facility]   Acute diverticulitis    Allergic rhinitis    Atrial fibrillation or flutter 12/10   Has been paroxysmal. Patient has not tolerated coumadin due to headaches. ECHO (12/10) Mild LVH, EF 55%,    BPH (benign prostatic hyperplasia)    Chest pain 12/10   Lexiscan myoview with EF 55%, no ischemia or infarction   Chronic constipation 01/10/2015   Dysrhythmia    AFIB   HOH (hard of hearing)    Hyperlipemia    Hypertension    Osteoarthritis    Pacemaker  medtronic 02/06/2010   Qualifier: Diagnosis of  By: Lovena Le, MD, Martyn Malay    Presence of permanent cardiac pacemaker    Sick sinus syndrome ([place])    S/P dual chamber Medtronic PCM   Sigmoid volvulus [facility])     Past Surgical History:  Procedure Laterality Date   CATARACT EXTRACTION  W/PHACO Left 01/16/2017   Procedure: CATARACT EXTRACTION PHACO AND INTRAOCULAR LENS PLACEMENT ([place]);  Surgeon: Estill Cotta, MD;  Location: [facility] ORS;  Service: Ophthalmology;  Laterality: Left;  [place] 1:51.3 AP% 25.9 CDE 54.80 FLUID PACK LOT # [id number]   COLONOSCOPY WITH PROPOFOL N/A 09/15/2018   Procedure: COLONOSCOPY WITH PROPOFOL;  Surgeon: Lin Landsman, MD;  Location[facility] ENDOSCOPY;  Service: Gastroenterology;  Laterality: N/A;   dual chamber pacemaker  01/11   Dr. Lynann Beaver SIGMOIDOSCOPY N/A 10/07/2018   Procedure: FLEXIBLE SIGMOIDOSCOPY;  Surgeon: Virgel Manifold, MD;  Location: [facility] ENDOSCOPY;  Service: Endoscopy;  Laterality: N/A;   LAPAROSCOPIC CHOLECYSTECTOMY  1/14   Dr Bary Castilla   PARTIAL COLECTOMY N/A 10/13/2018   Procedure: LAPAROTOMY, LEFT COLECTOMY,;  Surgeon: Vickie Epley, MD;  Location: [facility] ORS;  Service: General;  Laterality: N/A;   VASECTOMY  in his 80's    Family History  Problem Relation Age of Onset   Hypertension Mother    Alcohol abuse Father    Bladder Cancer Neg Hx    Prostate cancer Neg Hx    Kidney cancer Neg Hx     Social History   Socioeconomic History   Marital status: Married    Spouse name: Not on  file   Number of children: 3   Years of education: Not on file   Highest education level: Not on file  Occupational History   Occupation: Retired    Fish farm manager: RETIRED    Comment: Hydrologist  Tobacco Use   Smoking status: Former   Smokeless tobacco: Never   Tobacco comments:    In [employer], quit in 1960's  Vaping Use   Vaping Use: Never used  Substance and Sexual Activity   Alcohol use: No   Drug use: No   Sexual activity: Yes  Other Topics Concern   Not on file  Social History Narrative   2 Daughters, one son killed in [place]      Has living will   Wife, then daughter Maudie Mercury, is health care POA   Would accept resuscitation attempts but no prolonged life support.   No tube feeding if cognitively unaware    Social Determinants of Health   Financial Resource Strain: Not on file  Food Insecurity: Not on file  Transportation Needs: Not on file  Physical Activity: Not on file  Stress: Not on file  Social Connections: Not on file  Intimate Partner Violence: Not on file    Outpatient Medications Prior to Visit  Medication Sig Dispense Refill   acetaminophen (TYLENOL) 325 MG tablet Take 650 mg by mouth every 6 (six) hours as needed. Reported on 01/17/2016     ALPRAZolam (XANAX) 0.25 MG tablet TAKE 1 TABLET(0.25 MG) BY MOUTH TWICE DAILY AS NEEDED FOR ANXIETY 60 tablet 0   apixaban (ELIQUIS) 2.5 MG TABS tablet Take 2.5 mg by mouth 2 (two) times daily.     diltiazem (TIAZAC) 180 MG 24 hr capsule Take 180 mg by mouth daily.     finasteride (PROSCAR) 5 MG tablet Take 1 tablet (5 mg total) by mouth daily. 90 tablet 3   Glucosamine 500 MG CAPS Take by mouth daily.       tamsulosin (FLOMAX) 0.4 MG CAPS capsule TAKE ONE CAPSULE BY MOUTH EVERY DAY 30 capsule 11   No facility-administered medications prior to visit.    Allergies  Allergen Reactions   Augmentin [Amoxicillin-Pot Clavulanate] Rash   Colchicine Rash   Morphine And Related     Shaking, back pain.    ROS Review of Systems  Constitutional:  Negative for fatigue.  Respiratory:  Negative for cough, shortness of breath and wheezing.   Cardiovascular:  Negative for chest pain, palpitations and leg swelling.  Musculoskeletal:  Negative for arthralgias, joint swelling, neck pain and neck stiffness.  Neurological:  Positive for numbness (bil hands). Negative for dizziness, tremors, weakness and light-headedness.     Objective:    Physical Exam Constitutional:      General: He is not in acute distress.    Appearance: Normal appearance. He is normal weight. He is not ill-appearing, toxic-appearing or diaphoretic.  HENT:     Head: Normocephalic.  Cardiovascular:     Rate and Rhythm: Normal rate and regular rhythm.  Pulmonary:      Effort: Pulmonary effort is normal.     Breath sounds: Normal breath sounds. No wheezing.  Musculoskeletal:     Right hand: No swelling or tenderness. Normal range of motion. Normal strength. Normal sensation. Decreased capillary refill. Normal pulse.     Left hand: No swelling or tenderness. Normal range of motion. Normal strength. Normal sensation. Decreased capillary refill. Normal pulse.     Cervical back: Normal range of motion. No tenderness.  Comments: Hebeden node distal phalynx left 4th metatarsal  Skin:    General: Skin is warm.     Capillary Refill: Capillary refill takes more than 3 seconds.  Neurological:     General: No focal deficit present.     Mental Status: He is alert and oriented to person, place, and time.     Cranial Nerves: Cranial nerves 2-12 are intact.     Sensory: Sensation is intact. No sensory deficit.     Coordination: Coordination is intact.     Gait: Gait is intact.     Comments: Negative tinels, negative phalens  Psychiatric:        Mood and Affect: Mood normal.        Behavior: Behavior normal.        Thought Content: Thought content normal.        Judgment: Judgment normal.    BP (!) 172/94    Pulse 94    Temp (!) 96.4 F (35.8 C) (Temporal)    Ht 5\' 10"  (1.778 m)    Wt 168 lb (76.2 kg)    SpO2 96%    BMI 24.11 kg/m  Wt Readings from Last 3 Encounters:  10/13/21 168 lb (76.2 kg)  07/20/21 165 lb 8 oz (75.1 kg)  06/30/21 163 lb (73.9 kg)     Health Maintenance Due  Topic Date Due   Zoster Vaccines- Shingrix (1 of 2) Never done   COVID-19 Vaccine (3 - Moderna risk series) 07/27/2021   COLONOSCOPY (Pts 45-17yrs Insurance coverage will need to be confirmed)  09/15/2021    There are no preventive care reminders to display for this patient.  Lab Results  Component Value Date   TSH 1.330 11/17/2013   Lab Results  Component Value Date   WBC 4.9 02/29/2020   HGB 12.7 (L) 02/29/2020   HCT 37.2 (L) 02/29/2020   MCV 92.5 02/29/2020   PLT  257.0 02/29/2020   Lab Results  Component Value Date   NA 141 02/29/2020   K 3.9 02/29/2020   CO2 29 02/29/2020   GLUCOSE 90 02/29/2020   BUN 19 02/29/2020   CREATININE 1.17 02/29/2020   BILITOT 0.5 02/29/2020   ALKPHOS 60 02/29/2020   AST 15 02/29/2020   ALT 12 02/29/2020   PROT 6.9 02/29/2020   ALBUMIN 3.8 02/29/2020   CALCIUM 9.1 02/29/2020   ANIONGAP 10 09/30/2019   GFR 59.23 (L) 02/29/2020   No results found for: HGBA1C    Assessment & Plan:   Problem List Items Addressed This Visit       Cardiovascular and Mediastinum   Atrial fibrillation ([place])    Pt to continue with medications as prescribed. Pt also advised to notify cardiologist or neuropathic findings; however, less likely cardiac related. Neuropathy workup in progress.         Nervous and Auditory   Neuropathy    Lab work to include B12 TSH a1c sed rate and ana ordered, pending results for neuropathy workup. Referral sent for neurology patient to advise [place] if he does not hear from anything in the next 1 week.      Relevant Orders   B12 and Folate Panel   TSH   Ambulatory referral to Neurology   ANA   Sedimentation rate     Other   Neuralgia   Relevant Orders   B12 and Folate Panel   TSH   ANA   Sedimentation rate   Hyperglycemia - Primary   Relevant Orders  Hemoglobin A1c    No orders of the defined types were placed in this encounter.   Follow-up: Return in about 3 months (around 01/11/2022) for PCP for follow up.    Eugenia Pancoast, FNP

## 2021-10-13 NOTE — Assessment & Plan Note (Signed)
Pt to continue with medications as prescribed. Pt also advised to notify cardiologist or neuropathic findings; however, less likely cardiac related. Neuropathy workup in progress.

## 2021-10-13 NOTE — Progress Notes (Signed)
B12 is acceptable, pending ANA. Sed rate is elevated, pt with arthralgias and heberdens nodes on exam. May consider rheumatologist referral.

## 2021-10-13 NOTE — Assessment & Plan Note (Signed)
Lab work to include B12 TSH a1c sed rate and ana ordered, pending results for neuropathy workup. Referral sent for neurology patient to advise [place] if he does not hear from anything in the next 1 week.

## 2021-10-14 LAB — ANA: Anti Nuclear Antibody (ANA): NEGATIVE

## 2021-10-17 ENCOUNTER — Ambulatory Visit: Payer: No Typology Code available for payment source | Admitting: Internal Medicine

## 2022-01-31 ENCOUNTER — Ambulatory Visit: Payer: Medicare Other | Admitting: Internal Medicine

## 2022-01-31 ENCOUNTER — Encounter: Payer: Self-pay | Admitting: Internal Medicine

## 2022-01-31 ENCOUNTER — Ambulatory Visit (INDEPENDENT_AMBULATORY_CARE_PROVIDER_SITE_OTHER): Payer: Medicare Other | Admitting: Internal Medicine

## 2022-01-31 VITALS — BP 134/84 | HR 81 | Temp 97.7°F | Ht 70.0 in | Wt 163.0 lb

## 2022-01-31 DIAGNOSIS — R200 Anesthesia of skin: Secondary | ICD-10-CM | POA: Diagnosis not present

## 2022-01-31 NOTE — Progress Notes (Signed)
? ?Subjective:  ? ? Patient ID: [name], male    DOB: Apr 24, 1933, 88 y.o.   MRN: [id number] ? ?HPI ?Here due to ongoing hand numbness ? ?Hands still go to sleep---anytime he "gets still" ?Did try wrist braces---helped some (but still gets numbness) ?Okay if he moves around ?No longer awakens with numbness since using the braces ?Has numbness in all 5 fingers ? ?Notes no energy in the past 6 weeks ?Trouble even finishing 18 holes of gold ?Blood work at the [place] didn't find anything ?Still on the B12 --now oral ? ?Current Outpatient Medications on File Prior to Visit  ?Medication Sig Dispense Refill  ? acetaminophen (TYLENOL) 325 MG tablet Take 650 mg by mouth every 6 (six) hours as needed. Reported on 01/17/2016    ? ALPRAZolam (XANAX) 0.25 MG tablet TAKE 1 TABLET(0.25 MG) BY MOUTH TWICE DAILY AS NEEDED FOR ANXIETY 60 tablet 0  ? apixaban (ELIQUIS) 2.5 MG TABS tablet Take 2.5 mg by mouth 2 (two) times daily.    ? diltiazem (TIAZAC) 180 MG 24 hr capsule Take 180 mg by mouth daily.    ? finasteride (PROSCAR) 5 MG tablet Take 1 tablet (5 mg total) by mouth daily. 90 tablet 3  ? Glucosamine 500 MG CAPS Take by mouth daily.      ? tamsulosin (FLOMAX) 0.4 MG CAPS capsule TAKE ONE CAPSULE BY MOUTH EVERY DAY 30 capsule 11  ? vitamin B-12 (CYANOCOBALAMIN) 500 MCG tablet Take 1,000 mcg by mouth daily.    ? ?No current facility-administered medications on file prior to visit.  ? ? ?Allergies  ?Allergen Reactions  ? Augmentin [Amoxicillin-Pot Clavulanate] Rash  ? Colchicine Rash  ? Morphine And Related   ?  Shaking, back pain.  ? ? ?Past Medical History:  ?Diagnosis Date  ? Acute diverticulitis 12/15  ? [place]  ? Acute diverticulitis   ? Allergic rhinitis   ? Atrial fibrillation or flutter 12/10  ? Has been paroxysmal. Patient has not tolerated coumadin due to headaches. ECHO (12/10) Mild LVH, EF 55%,   ? BPH (benign prostatic hyperplasia)   ? Chest pain 12/10  ? Lexiscan myoview with EF 55%, no ischemia or infarction  ? Chronic  constipation 01/10/2015  ? Dysrhythmia   ? AFIB  ? HOH (hard of hearing)   ? Hyperlipemia   ? Hypertension   ? Osteoarthritis   ? Pacemaker  medtronic 02/06/2010  ? Qualifier: Diagnosis of  By: Lovena Le, MD, Martyn Malay   ? Presence of permanent cardiac pacemaker   ? Sick sinus syndrome ([place])   ? S/P dual chamber Medtronic PCM  ? Sigmoid volvulus ([place])   ? ? ?Past Surgical History:  ?Procedure Laterality Date  ? CATARACT EXTRACTION W/PHACO Left 01/16/2017  ? Procedure: CATARACT EXTRACTION PHACO AND INTRAOCULAR LENS PLACEMENT ([facility]);  Surgeon: Estill Cotta, MD;  Location: [facility] ORS;  Service: Ophthalmology;  Laterality: Left;  [place] 1:51.3 ?AP% 25.9 ?CDE 54.80 ?FLUID PACK LOT # [id number]  ? COLONOSCOPY WITH PROPOFOL N/A 09/15/2018  ? Procedure: COLONOSCOPY WITH PROPOFOL;  Surgeon: Lin Landsman, MD;  Location: [facility] ENDOSCOPY;  Service: Gastroenterology;  Laterality: N/A;  ? dual chamber pacemaker  01/11  ? Dr. Lovena Le  ? FLEXIBLE SIGMOIDOSCOPY N/A 10/07/2018  ? Procedure: FLEXIBLE SIGMOIDOSCOPY;  Surgeon: Virgel Manifold, MD;  Location: [facility] ENDOSCOPY;  Service: Endoscopy;  Laterality: N/A;  ? LAPAROSCOPIC CHOLECYSTECTOMY  1/14  ? Dr Bary Castilla  ? PARTIAL COLECTOMY N/A 10/13/2018  ? Procedure: LAPAROTOMY, LEFT COLECTOMY,;  Surgeon: Vickie Epley, MD;  Location: [facility] ORS;  Service: General;  Laterality: N/A;  ? VASECTOMY  in his 80's  ? ? ?Family History  ?Problem Relation Age of Onset  ? Hypertension Mother   ? Alcohol abuse Father   ? Bladder Cancer Neg Hx   ? Prostate cancer Neg Hx   ? Kidney cancer Neg Hx   ? ? ?Social History  ? ?Socioeconomic History  ? Marital status: Married  ?  Spouse name: Not on file  ? Number of children: 3  ? Years of education: Not on file  ? Highest education level: Not on file  ?Occupational History  ? Occupation: Retired  ?  Employer: RETIRED  ?  Comment: Hydrologist  ?Tobacco Use  ? Smoking status: Former  ?  Passive exposure: Past  ? Smokeless tobacco: Never  ?  Tobacco comments:  ?  In [place], quit in 1960's  ?Vaping Use  ? Vaping Use: Never used  ?Substance and Sexual Activity  ? Alcohol use: No  ? Drug use: No  ? Sexual activity: Yes  ?Other Topics Concern  ? Not on file  ?Social History Narrative  ? 2 Daughters, one son killed in [place]  ?   ? Has living will  ? Wife, then daughter Maudie Mercury, is health care POA  ? Would accept resuscitation attempts but no prolonged life support.  ? No tube feeding if cognitively unaware  ? ?Social Determinants of Health  ? ?Financial Resource Strain: Not on file  ?Food Insecurity: Not on file  ?Transportation Needs: Not on file  ?Physical Activity: Not on file  ?Stress: Not on file  ?Social Connections: Not on file  ?Intimate Partner Violence: Not on file  ? ?Review of Systems ?Eats fine ?Weight stable ?Sleeps okay---some nocturia but awakens refreshed ?Some decrease in left shoulder abduction --needs help to get arm up completely ?   ?Objective:  ? Physical Exam ?Musculoskeletal:  ?   Comments: Normal hand strength and grip ?Normal pulses (radial and ulnar)  ?  ? ? ? ? ?   ?Assessment & Plan:  ? ?

## 2022-01-31 NOTE — Assessment & Plan Note (Signed)
Not classic for carpal tunnel--but still fairly likely given his response to braces ?[facility]--asked him to check that ?Will set up with hand surgeon ?

## 2022-02-12 ENCOUNTER — Encounter: Payer: Self-pay | Admitting: *Deleted

## 2022-02-15 ENCOUNTER — Encounter: Payer: Self-pay | Admitting: Internal Medicine

## 2022-02-15 ENCOUNTER — Telehealth: Payer: Self-pay | Admitting: Internal Medicine

## 2022-02-15 NOTE — Telephone Encounter (Signed)
Pt called back and said that his denist will not clean his teeth unless they get a letter approving because he is on blood thinner. Call back for pt is [phone] ? ?[name] [facility] ? ?[address] [place], [place] [address] ?[phone] ?

## 2022-02-15 NOTE — Telephone Encounter (Signed)
robin from the hand center in [place]  call stated that the phone numbers that was provided for pt : [phone]  [name] is not in service so she could not set up appt ?

## 2022-02-16 NOTE — Telephone Encounter (Signed)
The dentist office is closed on Fridays. I left a message for pt to find out if he wants to pick it up or have me fax it Monday once I can get the fax number. ?

## 2022-02-19 NOTE — Telephone Encounter (Signed)
Letter placed up front for pickup. No Charge ?

## 2022-02-19 NOTE — Telephone Encounter (Signed)
Patient called he would like to pick up at our office. He will come by tomorrow to get.  ?

## 2022-06-20 ENCOUNTER — Telehealth: Payer: Self-pay

## 2022-06-20 NOTE — Patient Outreach (Signed)
  Care Coordination   06/20/2022 Name: [name] MRN: [id number] DOB: [birth year]   Care Coordination Outreach Attempts:  An unsuccessful telephone outreach was attempted today to offer the patient information about available care coordination services as a benefit of their health plan.   Follow Up Plan:  Additional outreach attempts will be made to offer the patient care coordination information and services.   Encounter Outcome:  No Answer  Care Coordination Interventions Activated:  No   Care Coordination Interventions:  No, not indicated      Enzo Montgomery, RN,BSN,CCM [place] [facility] Telephonic Care Management Coordinator Direct Phone: [phone] Toll Free: [phone] Fax: [phone]

## 2022-06-22 ENCOUNTER — Telehealth: Payer: Self-pay

## 2022-06-22 NOTE — Patient Outreach (Signed)
  Care Coordination   06/22/2022 Name: [name] MRN: [id number] DOB: 1933-04-07   Care Coordination Outreach Attempts:  A second unsuccessful outreach was attempted today to offer the patient with information about available care coordination services as a benefit of their health plan.     Follow Up Plan:  Additional outreach attempts will be made to offer the patient care coordination information and services.   Encounter Outcome:  No Answer  Care Coordination Interventions Activated:  No   Care Coordination Interventions:  No, not indicated      Enzo Montgomery, RN,BSN,CCM [place] [facility] Telephonic Care Management Coordinator Direct Phone: [phone] Toll Free: [phone] Fax: [phone]

## 2022-06-26 ENCOUNTER — Telehealth: Payer: Self-pay

## 2022-06-26 NOTE — Patient Outreach (Signed)
  Care Coordination   06/26/2022 Name: [name] MRN: [id number] DOB: Aug 24, 1933   Care Coordination Outreach Attempts:  A third unsuccessful outreach was attempted today to offer the patient with information about available care coordination services as a benefit of their health plan.   Follow Up Plan:  No further outreach attempts will be made at this time. We have been unable to contact the patient to offer or enroll patient in care coordination services  Encounter Outcome:  No Answer  Care Coordination Interventions Activated:  No   Care Coordination Interventions:  No, not indicated      Enzo Montgomery, RN,BSN,CCM [place] [facility] Telephonic Care Management Coordinator Direct Phone: [phone] Toll Free: [phone] Fax: [phone]

## 2022-10-11 ENCOUNTER — Ambulatory Visit (INDEPENDENT_AMBULATORY_CARE_PROVIDER_SITE_OTHER): Payer: Medicare Other

## 2022-10-11 DIAGNOSIS — Z23 Encounter for immunization: Secondary | ICD-10-CM | POA: Diagnosis not present

## 2022-11-08 DIAGNOSIS — G5603 Carpal tunnel syndrome, bilateral upper limbs: Secondary | ICD-10-CM | POA: Insufficient documentation

## 2023-01-02 ENCOUNTER — Emergency Department
Admission: EM | Admit: 2023-01-02 | Discharge: 2023-01-02 | Disposition: A | Discharge status: Home / Self Care | Payer: No Typology Code available for payment source | Attending: Emergency Medicine | Admitting: Emergency Medicine

## 2023-01-02 ENCOUNTER — Other Ambulatory Visit: Payer: Self-pay

## 2023-01-02 DIAGNOSIS — Z9861 Coronary angioplasty status: Secondary | ICD-10-CM | POA: Insufficient documentation

## 2023-01-02 DIAGNOSIS — Z9889 Other specified postprocedural states: Secondary | ICD-10-CM

## 2023-01-02 LAB — CBC
HCT: 39.4 % (ref 39.0–52.0)
Hemoglobin: 13.1 g/dL (ref 13.0–17.0)
MCH: 30.6 pg (ref 26.0–34.0)
MCHC: 33.2 g/dL (ref 30.0–36.0)
MCV: 92.1 fL (ref 80.0–100.0)
Platelets: 209 10*3/uL (ref 150–400)
RBC: 4.28 MIL/uL (ref 4.22–5.81)
RDW: 13.1 % (ref 11.5–15.5)
WBC: 5.7 10*3/uL (ref 4.0–10.5)
nRBC: 0 % (ref 0.0–0.2)

## 2023-01-02 LAB — BASIC METABOLIC PANEL
Anion gap: 9 (ref 5–15)
BUN: 19 mg/dL (ref 8–23)
CO2: 25 mmol/L (ref 22–32)
Calcium: 9.3 mg/dL (ref 8.9–10.3)
Chloride: 102 mmol/L (ref 98–111)
Creatinine, Ser: 0.79 mg/dL (ref 0.61–1.24)
GFR, Estimated: >60 mL/min (ref >60)
Glucose, Bld: 105 mg/dL — ABNORMAL HIGH (ref 70–99)
Potassium: 4.1 mmol/L (ref 3.5–5.1)
Sodium: 136 mmol/L (ref 135–145)

## 2023-01-02 NOTE — ED Triage Notes (Signed)
Pt had heart catheter placed yesterday at the [facility] and went through the right radial. Pt woke up with arm/hand swollen and was told to come to the ED. Pt denies chest pain. Pulses 2+ to the right radial artery. Pt denies all pain. Pt states no stents were placed but was told he had an 80-90% blockage.

## 2023-01-02 NOTE — Discharge Instructions (Addendum)
Please return right away if you have numbness, cold or blue hand, severe pain, noticed swelling around the right hand or wrist, swelling or pain into the arm, shortness of breath or other concerns arise.

## 2023-01-02 NOTE — ED Notes (Signed)
Dr. Myrene Buddy notified, no new orders.

## 2023-01-07 ENCOUNTER — Telehealth: Payer: Self-pay

## 2023-01-07 NOTE — Telephone Encounter (Signed)
     Patient  visit on 3/27  at [facility] [place]    Have you been able to follow up with your primary care physician? Yes   The patient was or was not able to obtain any needed medicine or equipment. Yes   Are there diet recommendations that you are having difficulty following? Na   Patient expresses understanding of discharge instructions and education provided has no other needs at this time.  Yes      Jerico Grisso [facility] Health Care Guide, [place] [phone] [address], [place], [place] [address] Phone: [phone] Email: [email or web address]    

## 2023-01-19 NOTE — ED Provider Notes (Signed)
[facility] Provider Note    None    (approximate)   History   Post-op Problem (Arm swelling)   HPI  [name] is a 89 y.o. male with a cardiac catheterization at the [place] yesterday  This morning when he woke up his hand felt numb and looked a little bit blue.  He called and they recommended he get his hand evaluated.  Patient reports that he took the bandage off and thinks it was too tight, not he took the bandaging around the wrist off his hand feels back to normal is not swollen he can feel it well no numbness and no change in color.  It seems quite fine now  Son is with him and also reports I think the bandage was just too tight and after taking the bandage off everything seems to look good  Has not had any bleeding or drainage from the site.  He feels well now     Physical Exam   Triage Vital Signs: ED Triage Vitals  Enc Vitals Group     BP 01/02/23 0926 121/62     Pulse Rate 01/02/23 0926 71     Resp 01/02/23 0926 17     Temp 01/02/23 0926 98.1 F (36.7 C)     Temp Source 01/02/23 1100 Oral     SpO2 01/02/23 0926 95 %     Weight 01/02/23 0927 148 lb (67.1 kg)     Height 01/02/23 0927  (1.778 m)     Head Circumference --      Peak Flow --      Pain Score 01/02/23 0927 0     Pain Loc --      Pain Edu? --      Excl. in GC? --     Most recent vital signs: Vitals:   01/02/23 0926 01/02/23 1100  BP: 121/62 134/68  Pulse: 71 (!) 59  Resp: 17 16  Temp: 98.1 F (36.7 C) 97.6 F (36.4 C)  SpO2: 95% 99%   EKG interpreted as atrial paced rhythm.  Prolonged conduction.  No evidence of acute ischemia.  Appears appropriately working pacemaker  General: Awake, no distress.  Patient and his son both very pleasant CV:  Good peripheral perfusion.  Strong and palpable right radial pulse the right forearm right arm and right hand appears quite normal except for puncture wound from where he had his arterial catheterization.  There is no  surrounding hematoma or swelling.  His capillary refill sensation and motor and neurologic movement of the right hand is normal.  It appears warm and well-perfused at this time.  Patient reports that after taking off the tight dressing at his wrists things seem to have gone back to normal Resp:  Normal effort.  Abd:  No distention.  Other:     ED Results / Procedures / Treatments   Labs (all labs ordered are listed, but only abnormal results are displayed) Labs Reviewed  BASIC METABOLIC PANEL - Abnormal; Notable for the following components:      Result Value   Glucose, Bld 105 (*)    All other components within normal limits  CBC     EKG     RADIOLOGY     PROCEDURES:  Critical Care performed: No  Procedures   MEDICATIONS ORDERED IN ED: Medications - No data to display   IMPRESSION / MDM / ASSESSMENT AND PLAN / ED COURSE  I reviewed the triage vital signs  and the nursing notes.                              Differential diagnosis includes, but is not limited to, vascular injury potential right radial injury, pseudoaneurysm formation, too tight of a dressing, etc.  The patient's exam is now quite normal and I suspect appropriate after he removed his dressing.  It seems that likely the circumferential dressing that he had was too tight and was probably causing problems with venous outflow and some circulatory compromise to the right hand.  It is now fully resolved.  I discussed with her vascular surgeon, they advised the patient may follow-up outpatient and they do not feel that further study at this time is required and I think this is quite agreeable  Discussed careful return precautions with patient.  He intends to follow-up with the [place].  Patient's presentation is most consistent with acute, uncomplicated illness.   Labs interpreted as normal CBC and metabolic panel.       FINAL CLINICAL IMPRESSION(S) / ED DIAGNOSES   Final diagnoses:  S/P cardiac cath      Rx / DC Orders   ED Discharge Orders     None        Note:  This document was prepared using Dragon voice recognition software and may include unintentional dictation errors.   Sharyn Creamer, MD 01/19/23 [phone]

## 2023-02-28 ENCOUNTER — Encounter: Payer: No Typology Code available for payment source | Attending: Family Medicine | Admitting: *Deleted

## 2023-02-28 DIAGNOSIS — R0600 Dyspnea, unspecified: Secondary | ICD-10-CM | POA: Insufficient documentation

## 2023-02-28 DIAGNOSIS — I4892 Unspecified atrial flutter: Secondary | ICD-10-CM | POA: Insufficient documentation

## 2023-02-28 DIAGNOSIS — I2510 Atherosclerotic heart disease of native coronary artery without angina pectoris: Secondary | ICD-10-CM | POA: Insufficient documentation

## 2023-02-28 DIAGNOSIS — Z950 Presence of cardiac pacemaker: Secondary | ICD-10-CM | POA: Insufficient documentation

## 2023-02-28 DIAGNOSIS — M75122 Complete rotator cuff tear or rupture of left shoulder, not specified as traumatic: Secondary | ICD-10-CM | POA: Insufficient documentation

## 2023-02-28 DIAGNOSIS — H9193 Unspecified hearing loss, bilateral: Secondary | ICD-10-CM | POA: Insufficient documentation

## 2023-02-28 DIAGNOSIS — R0602 Shortness of breath: Secondary | ICD-10-CM | POA: Insufficient documentation

## 2023-02-28 DIAGNOSIS — Z955 Presence of coronary angioplasty implant and graft: Secondary | ICD-10-CM

## 2023-02-28 NOTE — Progress Notes (Signed)
Initial phone call completed. Diagnosis can be found in [facility] Paperwork in media tab. EP Orientation scheduled for Wednesday 5/29 10:30.

## 2023-03-06 ENCOUNTER — Encounter: Payer: No Typology Code available for payment source | Admitting: *Deleted

## 2023-03-06 VITALS — Ht 69.5 in | Wt 146.2 lb

## 2023-03-06 DIAGNOSIS — I2510 Atherosclerotic heart disease of native coronary artery without angina pectoris: Secondary | ICD-10-CM | POA: Diagnosis present

## 2023-03-06 DIAGNOSIS — Z955 Presence of coronary angioplasty implant and graft: Secondary | ICD-10-CM | POA: Diagnosis not present

## 2023-03-06 NOTE — Progress Notes (Signed)
Cardiac Individual Treatment Plan  Patient Details  Name: [name] MRN: [id number] Date of Birth: 08-Feb-1933 Referring Provider:   Flowsheet Row Cardiac Rehab from 03/06/2023 in [facility] Cardiac and Pulmonary Rehab  Referring Provider Dr. Cammy Copa       Initial Encounter Date:  Flowsheet Row Cardiac Rehab from 03/06/2023 in [facility] Cardiac and Pulmonary Rehab  Date 03/06/23       Visit Diagnosis: Status post coronary artery stent placement  Patient's Home Medications on Admission:  Current Outpatient Medications:    acetaminophen (TYLENOL) 325 MG tablet, Take 650 mg by mouth every 6 (six) hours as needed. Reported on 01/17/2016, Disp: , Rfl:    ALPRAZolam (XANAX) 0.25 MG tablet, TAKE 1 TABLET(0.25 MG) BY MOUTH TWICE DAILY AS NEEDED FOR ANXIETY (Patient not taking: Reported on 02/28/2023), Disp: 60 tablet, Rfl: 0   apixaban (ELIQUIS) 2.5 MG TABS tablet, Take 2.5 mg by mouth 2 (two) times daily., Disp: , Rfl:    atorvastatin (LIPITOR) 80 MG tablet, TAKE ONE TABLET BY MOUTH AT BEDTIME FOR HIGH CHOLESTEROL, Disp: , Rfl:    clopidogrel (PLAVIX) 75 MG tablet, TAKE ONE TABLET BY MOUTH EVERY DAY FOR BLOOD CLOT PREVENTION FOLLOWING PCI BE SURE TO CHECK WITH YOUR DOCTOR ABOUT HOW LONG YOU SHOULD TAKE THIS MEDICINE, Disp: , Rfl:    diltiazem (TIAZAC) 180 MG 24 hr capsule, Take 180 mg by mouth daily. (Patient not taking: Reported on 02/28/2023), Disp: , Rfl:    finasteride (PROSCAR) 5 MG tablet, Take 1 tablet (5 mg total) by mouth daily., Disp: 90 tablet, Rfl: 3   Glucosamine 500 MG CAPS, Take by mouth daily.   (Patient not taking: Reported on 02/28/2023), Disp: , Rfl:    tamsulosin (FLOMAX) 0.4 MG CAPS capsule, TAKE ONE CAPSULE BY MOUTH EVERY DAY, Disp: 30 capsule, Rfl: 11   vitamin B-12 (CYANOCOBALAMIN) 500 MCG tablet, Take 1,000 mcg by mouth daily., Disp: , Rfl:   Past Medical History: Past Medical History:  Diagnosis Date   Acute diverticulitis 12/15   [facility]   Acute diverticulitis     Allergic rhinitis    Atrial fibrillation or flutter 12/10   Has been paroxysmal. Patient has not tolerated coumadin due to headaches. ECHO (12/10) Mild LVH, EF 55%,    BPH (benign prostatic hyperplasia)    Chest pain 12/10   Lexiscan myoview with EF 55%, no ischemia or infarction   Chronic constipation 01/10/2015   Dysrhythmia    AFIB   HOH (hard of hearing)    Hyperlipemia    Hypertension    Osteoarthritis    Pacemaker  medtronic 02/06/2010   Qualifier: Diagnosis of  By: Ladona Ridgel, MD, [facility], [place]    Presence of permanent cardiac pacemaker    Sick sinus syndrome (HCC)    S/P dual chamber Medtronic PCM   Sigmoid volvulus (HCC)     Tobacco Use: Social History   Tobacco Use  Smoking Status Former   Passive exposure: Past  Smokeless Tobacco Never  Tobacco Comments   In [employer], quit in 1960's    Labs: Review Flowsheet  More data exists      Latest Ref Rng & Units 12/01/2009 01/24/2011 04/29/2015 12/03/2016 10/13/2021  Labs for ITP Cardiac and Pulmonary Rehab  Cholestrol 0 - 200 mg/dL 409  811  914  782  -  LDL (calc) 0 - 99 mg/dL 956  - 213  086  -  Direct LDL mg/dL - 578.4  - - -  HDL-C >69.62 mg/dL  56  53.40  53.40  57.40  -  Trlycerides 0.0 - 149.0 mg/dL 62  16.1  09.6  04.5  -  Hemoglobin A1c 4.6 - 6.5 % - - - - 5.5      Exercise Target Goals: Exercise Program Goal: Individual exercise prescription set using results from initial 6 min walk test and THRR while considering  patient's activity barriers and safety.   Exercise Prescription Goal: Initial exercise prescription builds to 30-45 minutes a day of aerobic activity, 2-3 days per week.  Home exercise guidelines will be given to patient during program as part of exercise prescription that the participant will acknowledge.   Education: Aerobic Exercise: - Group verbal and visual presentation on the components of exercise prescription. Introduces F.I.T.T principle from ACSM for exercise prescriptions.  Reviews  F.I.T.T. principles of aerobic exercise including progression. Written material given at graduation. Flowsheet Row Cardiac Rehab from 03/06/2023 in [facility] Cardiac and Pulmonary Rehab  Education need identified 03/06/23       Education: Resistance Exercise: - Group verbal and visual presentation on the components of exercise prescription. Introduces F.I.T.T principle from ACSM for exercise prescriptions  Reviews F.I.T.T. principles of resistance exercise including progression. Written material given at graduation.    Education: Exercise & Equipment Safety: - Individual verbal instruction and demonstration of equipment use and safety with use of the equipment. Flowsheet Row Cardiac Rehab from 03/06/2023 in [facility] Cardiac and Pulmonary Rehab  Date 03/06/23  Educator [facility]  Instruction Review Code 1- Verbalizes Understanding       Education: Exercise Physiology & General Exercise Guidelines: - Group verbal and written instruction with models to review the exercise physiology of the cardiovascular system and associated critical values. Provides general exercise guidelines with specific guidelines to those with heart or lung disease.  Flowsheet Row Cardiac Rehab from 03/06/2023 in [facility] Cardiac and Pulmonary Rehab  Education need identified 03/06/23       Education: Flexibility, Balance, Mind/Body Relaxation: - Group verbal and visual presentation with interactive activity on the components of exercise prescription. Introduces F.I.T.T principle from ACSM for exercise prescriptions. Reviews F.I.T.T. principles of flexibility and balance exercise training including progression. Also discusses the mind body connection.  Reviews various relaxation techniques to help reduce and manage stress (i.e. Deep breathing, progressive muscle relaxation, and visualization). Balance handout provided to take home. Written material given at graduation.   Activity Barriers & Risk Stratification:  Activity Barriers &  Cardiac Risk Stratification - 03/06/23 1336       Activity Barriers & Cardiac Risk Stratification   Activity Barriers Joint Problems;Muscular Weakness    Cardiac Risk Stratification Moderate             6 Minute Walk:  6 Minute Walk     Row Name 03/06/23 1332         6 Minute Walk   Phase Initial     Distance 155 feet     Walk Time 1.42 minutes     # of Rest Breaks 1     MPH 0.29     METS 1.97     RPE 13     Perceived Dyspnea  1     VO2 Peak 6.9     Symptoms Yes (comment)  weak legs-stopped test after 1:25     Comments weak legs-stopped test at 1:25     Resting HR 65 bpm     Resting BP 130/62     Resting Oxygen Saturation  98 %  Exercise Oxygen Saturation  during 6 min walk 99 %     Max Ex. HR 81 bpm     Max Ex. BP 138/70     2 Minute Post BP 128/64              Oxygen Initial Assessment:   Oxygen Re-Evaluation:   Oxygen Discharge (Final Oxygen Re-Evaluation):   Initial Exercise Prescription:  Initial Exercise Prescription - 03/06/23 1300       Date of Initial Exercise RX and Referring Provider   Date 03/06/23    Referring Provider Dr. Cammy Copa      Oxygen   Maintain Oxygen Saturation 88% or higher      Treadmill   MPH 0.5    Grade 0    Minutes 15   eventual goal of 15 min. Start with intervals, resting as often as needed.   METs 1.4      Recumbant Bike   Level 1    RPM 50    Watts 15    Minutes 15      NuStep   Level 1    SPM 80    Minutes 15    METs 1.97      Track   Laps 5    Minutes 15    METs 1.27      Prescription Details   Frequency (times per week) 3    Duration Progress to 30 minutes of continuous aerobic without signs/symptoms of physical distress      Intensity   THRR 40-80% of Max Heartrate 91-118    Ratings of Perceived Exertion 11-13    Perceived Dyspnea 0-4      Progression   Progression Continue to progress workloads to maintain intensity without signs/symptoms of physical distress.       Resistance Training   Training Prescription Yes    Weight 4    Reps 10-15             Perform Capillary Blood Glucose checks as needed.  Exercise Prescription Changes:   Exercise Prescription Changes     Row Name 03/06/23 1300             Response to Exercise   Blood Pressure (Admit) 130/62       Blood Pressure (Exercise) 138/70       Blood Pressure (Exit) 128/64       Heart Rate (Admit) 65 bpm       Heart Rate (Exercise) 81 bpm       Heart Rate (Exit) 65 bpm       Oxygen Saturation (Admit) 98 %       Oxygen Saturation (Exercise) 99 %       Oxygen Saturation (Exit) 99 %       Rating of Perceived Exertion (Exercise) 13       Perceived Dyspnea (Exercise) 1       Symptoms weak legs- stopped test at 1:25       Comments 6 MWT results                Exercise Comments:   Exercise Goals and Review:   Exercise Goals     Row Name 03/06/23 1357             Exercise Goals   Increase Physical Activity Yes       Intervention Provide advice, education, support and counseling about physical activity/exercise needs.;Develop an individualized exercise prescription for aerobic and resistive training based on initial evaluation  findings, risk stratification, comorbidities and participant's personal goals.       Expected Outcomes Short Term: Attend rehab on a regular basis to increase amount of physical activity.;Long Term: Add in home exercise to make exercise part of routine and to increase amount of physical activity.;Long Term: Exercising regularly at least 3-5 days a week.       Increase Strength and Stamina Yes       Intervention Provide advice, education, support and counseling about physical activity/exercise needs.;Develop an individualized exercise prescription for aerobic and resistive training based on initial evaluation findings, risk stratification, comorbidities and participant's personal goals.       Expected Outcomes Short Term: Increase workloads from  initial exercise prescription for resistance, speed, and METs.;Short Term: Perform resistance training exercises routinely during rehab and add in resistance training at home;Long Term: Improve cardiorespiratory fitness, muscular endurance and strength as measured by increased METs and functional capacity ( )       Able to understand and use rate of perceived exertion (RPE) scale Yes       Intervention Provide education and explanation on how to use RPE scale       Expected Outcomes Short Term: Able to use RPE daily in rehab to express subjective intensity level;Long Term:  Able to use RPE to guide intensity level when exercising independently       Able to understand and use Dyspnea scale Yes       Intervention Provide education and explanation on how to use Dyspnea scale       Expected Outcomes Short Term: Able to use Dyspnea scale daily in rehab to express subjective sense of shortness of breath during exertion;Long Term: Able to use Dyspnea scale to guide intensity level when exercising independently       Knowledge and understanding of Target Heart Rate Range (THRR) Yes       Intervention Provide education and explanation of THRR including how the numbers were predicted and where they are located for reference       Expected Outcomes Short Term: Able to state/look up THRR;Long Term: Able to use THRR to govern intensity when exercising independently;Short Term: Able to use daily as guideline for intensity in rehab       Able to check pulse independently Yes       Intervention Provide education and demonstration on how to check pulse in carotid and radial arteries.;Review the importance of being able to check your own pulse for safety during independent exercise       Expected Outcomes Short Term: Able to explain why pulse checking is important during independent exercise;Long Term: Able to check pulse independently and accurately       Understanding of Exercise Prescription Yes        Intervention Provide education, explanation, and written materials on patient's individual exercise prescription       Expected Outcomes Short Term: Able to explain program exercise prescription;Long Term: Able to explain home exercise prescription to exercise independently                Exercise Goals Re-Evaluation :   Discharge Exercise Prescription (Final Exercise Prescription Changes):  Exercise Prescription Changes - 03/06/23 1300       Response to Exercise   Blood Pressure (Admit) 130/62    Blood Pressure (Exercise) 138/70    Blood Pressure (Exit) 128/64    Heart Rate (Admit) 65 bpm    Heart Rate (Exercise) 81 bpm    Heart Rate (Exit)  65 bpm    Oxygen Saturation (Admit) 98 %    Oxygen Saturation (Exercise) 99 %    Oxygen Saturation (Exit) 99 %    Rating of Perceived Exertion (Exercise) 13    Perceived Dyspnea (Exercise) 1    Symptoms weak legs- stopped test at 1:25    Comments 6 MWT results             Nutrition:  Target Goals: Understanding of nutrition guidelines, daily intake of sodium 1500mg , cholesterol 200mg , calories 30% from fat and 7% or less from saturated fats, daily to have 5 or more servings of fruits and vegetables.  Education: All About Nutrition: -Group instruction provided by verbal, written material, interactive activities, discussions, models, and posters to present general guidelines for heart healthy nutrition including fat, fiber, MyPlate, the role of sodium in heart healthy nutrition, utilization of the nutrition label, and utilization of this knowledge for meal planning. Follow up email sent as well. Written material given at graduation.   Biometrics:  Pre Biometrics - 03/06/23 1358       Pre Biometrics   Height 5' 9.5" (1.765 m)    Weight 146 lb 3.2 oz (66.3 kg)    Waist Circumference 34 inches    Hip Circumference 37.5 inches    Waist to Hip Ratio 0.91 %    BMI (Calculated) 21.29    Single Leg Stand 1.97 seconds               Nutrition Therapy Plan and Nutrition Goals:  Nutrition Therapy & Goals - 03/06/23 1400       Intervention Plan   Intervention Prescribe, educate and counsel regarding individualized specific dietary modifications aiming towards targeted core components such as weight, hypertension, lipid management, diabetes, heart failure and other comorbidities.    Expected Outcomes Short Term Goal: Understand basic principles of dietary content, such as calories, fat, sodium, cholesterol and nutrients.;Short Term Goal: A plan has been developed with personal nutrition goals set during dietitian appointment.;Long Term Goal: Adherence to prescribed nutrition plan.             Nutrition Assessments:  MEDIFICTS Score Key: ?70 Need to make dietary changes  40-70 Heart Healthy Diet ? 40 Therapeutic Level Cholesterol Diet  Flowsheet Row Cardiac Rehab from 03/06/2023 in [facility] Cardiac and Pulmonary Rehab  Picture Your Plate Total Score on Admission 39      Picture Your Plate Scores: <16 Unhealthy dietary pattern with much room for improvement. 41-50 Dietary pattern unlikely to meet recommendations for good health and room for improvement. 51-60 More healthful dietary pattern, with some room for improvement.  >60 Healthy dietary pattern, although there may be some specific behaviors that could be improved.    Nutrition Goals Re-Evaluation:   Nutrition Goals Discharge (Final Nutrition Goals Re-Evaluation):   Psychosocial: Target Goals: Acknowledge presence or absence of significant depression and/or stress, maximize coping skills, provide positive support system. Participant is able to verbalize types and ability to use techniques and skills needed for reducing stress and depression.   Education: Stress, Anxiety, and Depression - Group verbal and visual presentation to define topics covered.  Reviews how body is impacted by stress, anxiety, and depression.  Also discusses healthy ways to  reduce stress and to treat/manage anxiety and depression.  Written material given at graduation.   Education: Sleep Hygiene -Provides group verbal and written instruction about how sleep can affect your health.  Define sleep hygiene, discuss sleep cycles and impact of sleep habits. Review  good sleep hygiene tips.    Initial Review & Psychosocial Screening:  Initial Psych Review & Screening - 02/28/23 1443       Initial Review   Current issues with Current Stress Concerns    Comments not feeling better after stents      Family Dynamics   Good Support System? Yes   wife     Barriers   Psychosocial barriers to participate in program There are no identifiable barriers or psychosocial needs.;The patient should benefit from training in stress management and relaxation.      Screening Interventions   Interventions Encouraged to exercise;Provide feedback about the scores to participant;To provide support and resources with identified psychosocial needs    Expected Outcomes Long Term Goal: Stressors or current issues are controlled or eliminated.;Short Term goal: Utilizing psychosocial counselor, staff and physician to assist with identification of specific Stressors or current issues interfering with healing process. Setting desired goal for each stressor or current issue identified.;Short Term goal: Identification and review with participant of any Quality of Life or Depression concerns found by scoring the questionnaire.;Long Term goal: The participant improves quality of Life and PHQ9 Scores as seen by post scores and/or verbalization of changes             Quality of Life Scores:   Quality of Life - 03/06/23 1358       Quality of Life   Select Quality of Life      Quality of Life Scores   Health/Function Pre 15.2 %    Socioeconomic Pre 28.93 %    Psych/Spiritual Pre 21.21 %    Family Pre 30 %    GLOBAL Pre 21.44 %            Scores of 19 and below usually indicate a  poorer quality of life in these areas.  A difference of  2-3 points is a clinically meaningful difference.  A difference of 2-3 points in the total score of the Quality of Life Index has been associated with significant improvement in overall quality of life, self-image, physical symptoms, and general health in studies assessing change in quality of life.  PHQ-9: Review Flowsheet  More data exists      03/06/2023 10/13/2021 08/26/2019 11/28/2016 04/29/2015  Depression screen PHQ 2/9  Decreased Interest 1 0 0 0 0  Down, Depressed, Hopeless 0 0 0 0 0  PHQ - 2 Score 1 0 0 0 0  Altered sleeping 0 - - - -  Tired, decreased energy 3 - - - -  Change in appetite 0 - - - -  Feeling bad or failure about yourself  1 - - - -  Trouble concentrating 0 - - - -  Moving slowly or fidgety/restless 0 - - - -  Suicidal thoughts 0 - - - -  PHQ-9 Score 5 - - - -  Difficult doing work/chores Somewhat difficult - - - -   Interpretation of Total Score  Total Score Depression Severity:  1-4 = Minimal depression, 5-9 = Mild depression, 10-14 = Moderate depression, 15-19 = Moderately severe depression, 20-27 = Severe depression   Psychosocial Evaluation and Intervention:  Psychosocial Evaluation - 02/28/23 1446       Psychosocial Evaluation & Interventions   Interventions Encouraged to exercise with the program and follow exercise prescription;Stress management education    Comments Mr. La is coming to the program after stent placement. He states that he unfortunately is not feeling as well as he thought  he would be after the stent. He still feels tired. He is trying to be patient with his health, but it is stressful that he still doesn't have energy. He states he is eating and sleeping well.  He is hopeful this program will help boost his stamina. His wife is his main support system.    Expected Outcomes Short: attend cardiac rehab for education and exercise. Long: develop and maintain positive self care habits.     Continue Psychosocial Services  Follow up required by staff             Psychosocial Re-Evaluation:   Psychosocial Discharge (Final Psychosocial Re-Evaluation):   Vocational Rehabilitation: Provide vocational rehab assistance to qualifying candidates.   Vocational Rehab Evaluation & Intervention:  Vocational Rehab - 02/28/23 1446       Initial Vocational Rehab Evaluation & Intervention   Assessment shows need for Vocational Rehabilitation No             Education: Education Goals: Education classes will be provided on a variety of topics geared toward better understanding of heart health and risk factor modification. Participant will state understanding/return demonstration of topics presented as noted by education test scores.  Learning Barriers/Preferences:  Learning Barriers/Preferences - 02/28/23 1446       Learning Barriers/Preferences   Learning Barriers Hearing    Learning Preferences Individual Instruction             General Cardiac Education Topics:  AED/CPR: - Group verbal and written instruction with the use of models to demonstrate the basic use of the AED with the basic ABC's of resuscitation.   Anatomy and Cardiac Procedures: - Group verbal and visual presentation and models provide information about basic cardiac anatomy and function. Reviews the testing methods done to diagnose heart disease and the outcomes of the test results. Describes the treatment choices: Medical Management, Angioplasty, or Coronary Bypass Surgery for treating various heart conditions including Myocardial Infarction, Angina, Valve Disease, and Cardiac Arrhythmias.  Written material given at graduation. Flowsheet Row Cardiac Rehab from 03/06/2023 in [facility] Cardiac and Pulmonary Rehab  Education need identified 03/06/23       Medication Safety: - Group verbal and visual instruction to review commonly prescribed medications for heart and lung disease. Reviews the  medication, class of the drug, and side effects. Includes the steps to properly store meds and maintain the prescription regimen.  Written material given at graduation.   Intimacy: - Group verbal instruction through game format to discuss how heart and lung disease can affect sexual intimacy. Written material given at graduation..   Know Your Numbers and Heart Failure: - Group verbal and visual instruction to discuss disease risk factors for cardiac and pulmonary disease and treatment options.  Reviews associated critical values for Overweight/Obesity, Hypertension, Cholesterol, and Diabetes.  Discusses basics of heart failure: signs/symptoms and treatments.  Introduces Heart Failure Zone chart for action plan for heart failure.  Written material given at graduation.   Infection Prevention: - Provides verbal and written material to individual with discussion of infection control including proper hand washing and proper equipment cleaning during exercise session. Flowsheet Row Cardiac Rehab from 03/06/2023 in [facility] Cardiac and Pulmonary Rehab  Date 03/06/23  Educator [facility]  Instruction Review Code 1- Verbalizes Understanding       Falls Prevention: - Provides verbal and written material to individual with discussion of falls prevention and safety. Flowsheet Row Cardiac Rehab from 03/06/2023 in [facility] Cardiac and Pulmonary Rehab  Date 03/06/23  Educator  [facility]  Instruction Review Code 1- Verbalizes Understanding       Other: -Provides group and verbal instruction on various topics (see comments)   Knowledge Questionnaire Score:  Knowledge Questionnaire Score - 03/06/23 1358       Knowledge Questionnaire Score   Pre Score 22/26             Core Components/Risk Factors/Patient Goals at Admission:  Personal Goals and Risk Factors at Admission - 03/06/23 1400       Core Components/Risk Factors/Patient Goals on Admission    Weight Management Yes    Intervention Weight Management:  Develop a combined nutrition and exercise program designed to reach desired caloric intake, while maintaining appropriate intake of nutrient and fiber, sodium and fats, and appropriate energy expenditure required for the weight goal.;Weight Management: Provide education and appropriate resources to help participant work on and attain dietary goals.    Admit Weight 146 lb 3.2 oz (66.3 kg)    Goal Weight: Short Term 150 lb (68 kg)    Goal Weight: Long Term 155 lb (70.3 kg)    Expected Outcomes Short Term: Continue to assess and modify interventions until short term weight is achieved;Long Term: Adherence to nutrition and physical activity/exercise program aimed toward attainment of established weight goal;Weight Gain: Understanding of general recommendations for a high calorie, high protein meal plan that promotes weight gain by distributing calorie intake throughout the day with the consumption for 4-5 meals, snacks, and/or supplements;Understanding of distribution of calorie intake throughout the day with the consumption of 4-5 meals/snacks;Understanding recommendations for meals to include 15-35% energy as protein, 25-35% energy from fat, 35-60% energy from carbohydrates, less than 200mg  of dietary cholesterol, 20-35 gm of total fiber daily    Hypertension Yes    Intervention Provide education on lifestyle modifcations including regular physical activity/exercise, weight management, moderate sodium restriction and increased consumption of fresh fruit, vegetables, and low fat dairy, alcohol moderation, and smoking cessation.;Monitor prescription use compliance.    Expected Outcomes Short Term: Continued assessment and intervention until BP is < 140/71mm HG in hypertensive participants. < 130/28mm HG in hypertensive participants with diabetes, heart failure or chronic kidney disease.;Long Term: Maintenance of blood pressure at goal levels.    Lipids Yes    Intervention Provide education and support for  participant on nutrition & aerobic/resistive exercise along with prescribed medications to achieve LDL 70mg , HDL >40mg .    Expected Outcomes Short Term: Participant states understanding of desired cholesterol values and is compliant with medications prescribed. Participant is following exercise prescription and nutrition guidelines.;Long Term: Cholesterol controlled with medications as prescribed, with individualized exercise RX and with personalized nutrition plan. Value goals: LDL < 70mg , HDL > 40 mg.             Education:Diabetes - Individual verbal and written instruction to review signs/symptoms of diabetes, desired ranges of glucose level fasting, after meals and with exercise. Acknowledge that pre and post exercise glucose checks will be done for 3 sessions at entry of program.   Core Components/Risk Factors/Patient Goals Review:    Core Components/Risk Factors/Patient Goals at Discharge (Final Review):    ITP Comments:  ITP Comments     Row Name 02/28/23 1433 03/06/23 1331         ITP Comments Initial phone call completed. Diagnosis can be found in [facility] Paperwork in media tab. EP Orientation scheduled for Wednesday 5/29 10:30. Completed and gym orientation. Initial ITP created and sent for review to Dr. Loraine Leriche  Hyacinth Meeker, Medical Director.               Comments: initial ITP

## 2023-03-06 NOTE — Patient Instructions (Signed)
Patient Instructions  Patient Details  Name: [name] MRN: [id number] Date of Birth: 1933-04-05 Referring Provider:  Concha Pyo, MD  Below are your personal goals for exercise, nutrition, and risk factors. Our goal is to help you stay on track towards obtaining and maintaining these goals. We will be discussing your progress on these goals with you throughout the program.  Initial Exercise Prescription:  Initial Exercise Prescription - 03/06/23 1300       Date of Initial Exercise RX and Referring Provider   Date 03/06/23    Referring Provider Dr. Cammy Copa      Oxygen   Maintain Oxygen Saturation 88% or higher      Treadmill   MPH 0.5    Grade 0    Minutes 15   eventual goal of 15 min. Start with intervals, resting as often as needed.   METs 1.4      Recumbant Bike   Level 1    RPM 50    Watts 15    Minutes 15      NuStep   Level 1    SPM 80    Minutes 15    METs 1.97      Track   Laps 5    Minutes 15    METs 1.27      Prescription Details   Frequency (times per week) 3    Duration Progress to 30 minutes of continuous aerobic without signs/symptoms of physical distress      Intensity   THRR 40-80% of Max Heartrate 91-118    Ratings of Perceived Exertion 11-13    Perceived Dyspnea 0-4      Progression   Progression Continue to progress workloads to maintain intensity without signs/symptoms of physical distress.      Resistance Training   Training Prescription Yes    Weight 4    Reps 10-15             Exercise Goals: Frequency: Be able to perform aerobic exercise two to three times per week in program working toward 2-5 days per week of home exercise.  Intensity: Work with a perceived exertion of 11 (fairly light) - 15 (hard) while following your exercise prescription.  We will make changes to your prescription with you as you progress through the program.   Duration: Be able to do 30 to 45 minutes of continuous aerobic exercise in  addition to a 5 minute warm-up and a 5 minute cool-down routine.   Nutrition Goals: Your personal nutrition goals will be established when you do your nutrition analysis with the dietician.  The following are general nutrition guidelines to follow: Cholesterol < 200mg /day Sodium < 1500mg /day Fiber: Men over 50 yrs - 30 grams per day  Personal Goals:  Personal Goals and Risk Factors at Admission - 03/06/23 1400       Core Components/Risk Factors/Patient Goals on Admission    Weight Management Yes    Intervention Weight Management: Develop a combined nutrition and exercise program designed to reach desired caloric intake, while maintaining appropriate intake of nutrient and fiber, sodium and fats, and appropriate energy expenditure required for the weight goal.;Weight Management: Provide education and appropriate resources to help participant work on and attain dietary goals.    Admit Weight 146 lb 3.2 oz (66.3 kg)    Goal Weight: Short Term 150 lb (68 kg)    Goal Weight: Long Term 155 lb (70.3 kg)    Expected Outcomes Short Term: Continue to  assess and modify interventions until short term weight is achieved;Long Term: Adherence to nutrition and physical activity/exercise program aimed toward attainment of established weight goal;Weight Gain: Understanding of general recommendations for a high calorie, high protein meal plan that promotes weight gain by distributing calorie intake throughout the day with the consumption for 4-5 meals, snacks, and/or supplements;Understanding of distribution of calorie intake throughout the day with the consumption of 4-5 meals/snacks;Understanding recommendations for meals to include 15-35% energy as protein, 25-35% energy from fat, 35-60% energy from carbohydrates, less than 200mg  of dietary cholesterol, 20-35 gm of total fiber daily    Hypertension Yes    Intervention Provide education on lifestyle modifcations including regular physical activity/exercise,  weight management, moderate sodium restriction and increased consumption of fresh fruit, vegetables, and low fat dairy, alcohol moderation, and smoking cessation.;Monitor prescription use compliance.    Expected Outcomes Short Term: Continued assessment and intervention until BP is < 140/66mm HG in hypertensive participants. < 130/56mm HG in hypertensive participants with diabetes, heart failure or chronic kidney disease.;Long Term: Maintenance of blood pressure at goal levels.    Lipids Yes    Intervention Provide education and support for participant on nutrition & aerobic/resistive exercise along with prescribed medications to achieve LDL 70mg , HDL >40mg .    Expected Outcomes Short Term: Participant states understanding of desired cholesterol values and is compliant with medications prescribed. Participant is following exercise prescription and nutrition guidelines.;Long Term: Cholesterol controlled with medications as prescribed, with individualized exercise RX and with personalized nutrition plan. Value goals: LDL < 70mg , HDL > 40 mg.             Tobacco Use Initial Evaluation: Social History   Tobacco Use  Smoking Status Former   Passive exposure: Past  Smokeless Tobacco Never  Tobacco Comments   In [employer], quit in 1960's    Exercise Goals and Review:  Exercise Goals     Row Name 03/06/23 1357             Exercise Goals   Increase Physical Activity Yes       Intervention Provide advice, education, support and counseling about physical activity/exercise needs.;Develop an individualized exercise prescription for aerobic and resistive training based on initial evaluation findings, risk stratification, comorbidities and participant's personal goals.       Expected Outcomes Short Term: Attend rehab on a regular basis to increase amount of physical activity.;Long Term: Add in home exercise to make exercise part of routine and to increase amount of physical activity.;Long Term:  Exercising regularly at least 3-5 days a week.       Increase Strength and Stamina Yes       Intervention Provide advice, education, support and counseling about physical activity/exercise needs.;Develop an individualized exercise prescription for aerobic and resistive training based on initial evaluation findings, risk stratification, comorbidities and participant's personal goals.       Expected Outcomes Short Term: Increase workloads from initial exercise prescription for resistance, speed, and METs.;Short Term: Perform resistance training exercises routinely during rehab and add in resistance training at home;Long Term: Improve cardiorespiratory fitness, muscular endurance and strength as measured by increased METs and functional capacity ( )       Able to understand and use rate of perceived exertion (RPE) scale Yes       Intervention Provide education and explanation on how to use RPE scale       Expected Outcomes Short Term: Able to use RPE daily in rehab to express subjective intensity level;Long Term:  Able to use RPE to guide intensity level when exercising independently       Able to understand and use Dyspnea scale Yes       Intervention Provide education and explanation on how to use Dyspnea scale       Expected Outcomes Short Term: Able to use Dyspnea scale daily in rehab to express subjective sense of shortness of breath during exertion;Long Term: Able to use Dyspnea scale to guide intensity level when exercising independently       Knowledge and understanding of Target Heart Rate Range (THRR) Yes       Intervention Provide education and explanation of THRR including how the numbers were predicted and where they are located for reference       Expected Outcomes Short Term: Able to state/look up THRR;Long Term: Able to use THRR to govern intensity when exercising independently;Short Term: Able to use daily as guideline for intensity in rehab       Able to check pulse independently Yes        Intervention Provide education and demonstration on how to check pulse in carotid and radial arteries.;Review the importance of being able to check your own pulse for safety during independent exercise       Expected Outcomes Short Term: Able to explain why pulse checking is important during independent exercise;Long Term: Able to check pulse independently and accurately       Understanding of Exercise Prescription Yes       Intervention Provide education, explanation, and written materials on patient's individual exercise prescription       Expected Outcomes Short Term: Able to explain program exercise prescription;Long Term: Able to explain home exercise prescription to exercise independently                Copy of goals given to participant.

## 2023-03-08 ENCOUNTER — Encounter: Payer: No Typology Code available for payment source | Admitting: *Deleted

## 2023-03-08 DIAGNOSIS — Z955 Presence of coronary angioplasty implant and graft: Secondary | ICD-10-CM

## 2023-03-08 DIAGNOSIS — I2510 Atherosclerotic heart disease of native coronary artery without angina pectoris: Secondary | ICD-10-CM | POA: Diagnosis not present

## 2023-03-08 NOTE — Progress Notes (Addendum)
Daily Session Note  Patient Details  Name: [name] MRN: [id number] Date of Birth: 25-Nov-1932 Referring Provider:   Flowsheet Row Cardiac Rehab from 03/06/2023 in [facility] Cardiac and Pulmonary Rehab  Referring Provider Dr. Cammy Copa       Encounter Date: 03/08/2023  Check In:  Session Check In - 03/08/23 1011       Check-In   Supervising physician immediately available to respond to emergencies See telemetry face sheet for immediately available ER MD    Location [facility]-Cardiac & Pulmonary Rehab    Staff Present Cora Collum, RN, BSN, CCRP;Jessica [place], MA, RCEP, CCRP, CCET;Joseph [place], [place]    Virtual Visit No    Medication changes reported     No    Fall or balance concerns reported    No    Warm-up and Cool-down Performed on first and last piece of equipment    Resistance Training Performed Yes    VAD Patient? No    PAD/SET Patient? No      Pain Assessment   Currently in Pain? No/denies                Social History   Tobacco Use  Smoking Status Former   Passive exposure: Past  Smokeless Tobacco Never  Tobacco Comments   In [employer], quit in 1960's    Goals Met:  Independence with exercise equipment Exercise tolerated well No report of concerns or symptoms today  Goals Unmet:  Not Applicable  Comments: Pt able to follow exercise prescription today without complaint.  Will continue to monitor for progression.  First full day of exercise!  Patient was oriented to gym and equipment including functions, settings, policies, and procedures.  Patient's individual exercise prescription and treatment plan were reviewed.  All starting workloads were established based on the results of the 6 minute walk test done at initial orientation visit.  The plan for exercise progression was also introduced and progression will be customized based on patient's performance and goals.   Dr. Bethann Punches is Medical Director for [facility] Cardiac Rehabilitation.   Dr. Vida Rigger is Medical Director for [facility] Pulmonary Rehabilitation.

## 2023-03-11 ENCOUNTER — Encounter: Payer: No Typology Code available for payment source | Attending: Family Medicine | Admitting: *Deleted

## 2023-03-11 DIAGNOSIS — Z955 Presence of coronary angioplasty implant and graft: Secondary | ICD-10-CM | POA: Diagnosis present

## 2023-03-11 NOTE — Progress Notes (Signed)
Daily Session Note  Patient Details  Name: [name] MRN: [id number] Date of Birth: 13-Feb-1933 Referring Provider:   Flowsheet Row Cardiac Rehab from 03/06/2023 in [facility] Cardiac and Pulmonary Rehab  Referring Provider Dr. Cammy Copa       Encounter Date: 03/11/2023  Check In:  Session Check In - 03/11/23 0930       Check-In   Supervising physician immediately available to respond to emergencies See telemetry face sheet for immediately available ER MD    Location [facility]-Cardiac & Pulmonary Rehab    Staff Present Lanny Hurst, RN, Franki Monte, BS, ACSM CEP, Exercise Physiologist;Noah Tickle, BS, Exercise Physiologist    Virtual Visit No    Medication changes reported     No    Fall or balance concerns reported    Yes    Comments Larey Seat Saturday 6/1, did not hit head, minor injuries only    Warm-up and Cool-down Performed on first and last piece of equipment    Resistance Training Performed Yes    VAD Patient? No    PAD/SET Patient? No      Pain Assessment   Currently in Pain? No/denies                Social History   Tobacco Use  Smoking Status Former   Passive exposure: Past  Smokeless Tobacco Never  Tobacco Comments   In [employer], quit in 1960's    Goals Met:  Independence with exercise equipment Exercise tolerated well No report of concerns or symptoms today Strength training completed today  Goals Unmet:  Not Applicable  Comments: Pt able to follow exercise prescription today without complaint.  Will continue to monitor for progression.    Dr. Bethann Punches is Medical Director for [facility] Cardiac Rehabilitation.  Dr. Vida Rigger is Medical Director for [facility] Pulmonary Rehabilitation.

## 2023-03-13 DIAGNOSIS — Z955 Presence of coronary angioplasty implant and graft: Secondary | ICD-10-CM

## 2023-03-13 NOTE — Progress Notes (Signed)
Daily Session Note  Patient Details  Name: [name] MRN: [id number] Date of Birth: 01-08-1933 Referring Provider:   Flowsheet Row Cardiac Rehab from 03/06/2023 in [facility] Cardiac and Pulmonary Rehab  Referring Provider Dr. Cammy Copa       Encounter Date: 03/13/2023  Check In:  Session Check In - 03/13/23 1036       Check-In   Supervising physician immediately available to respond to emergencies See telemetry face sheet for immediately available ER MD    Location [facility]-Cardiac & Pulmonary Rehab    Staff Present Cora Collum, RN, BSN, CCRP;Jessica Williams, MA, RCEP, CCRP, CCET;Noah Tickle, BS, Exercise Physiologist;Joseph [place], [place]    Virtual Visit No    Medication changes reported     No    Fall or balance concerns reported    No    Warm-up and Cool-down Performed on first and last piece of equipment    Resistance Training Performed Yes    VAD Patient? No    PAD/SET Patient? No      Pain Assessment   Currently in Pain? No/denies                Social History   Tobacco Use  Smoking Status Former   Passive exposure: Past  Smokeless Tobacco Never  Tobacco Comments   In [employer], quit in 1960's    Goals Met:  Independence with exercise equipment Exercise tolerated well No report of concerns or symptoms today  Goals Unmet:  Not Applicable  Comments: Pt able to follow exercise prescription today without complaint.  Will continue to monitor for progression.    Dr. Bethann Punches is Medical Director for [facility] Cardiac Rehabilitation.  Dr. Vida Rigger is Medical Director for [facility] Pulmonary Rehabilitation.

## 2023-03-15 DIAGNOSIS — M79675 Pain in left toe(s): Secondary | ICD-10-CM | POA: Diagnosis not present

## 2023-03-15 DIAGNOSIS — M79674 Pain in right toe(s): Secondary | ICD-10-CM | POA: Diagnosis not present

## 2023-03-15 DIAGNOSIS — B351 Tinea unguium: Secondary | ICD-10-CM | POA: Diagnosis not present

## 2023-03-15 DIAGNOSIS — L600 Ingrowing nail: Secondary | ICD-10-CM | POA: Diagnosis not present

## 2023-03-18 ENCOUNTER — Encounter: Payer: No Typology Code available for payment source | Admitting: *Deleted

## 2023-03-18 DIAGNOSIS — Z955 Presence of coronary angioplasty implant and graft: Secondary | ICD-10-CM | POA: Diagnosis not present

## 2023-03-18 NOTE — Progress Notes (Signed)
Daily Session Note  Patient Details  Name: [name] MRN: [id number] Date of Birth: 03-05-1933 Referring Provider:   Flowsheet Row Cardiac Rehab from 03/06/2023 in [facility] Cardiac and Pulmonary Rehab  Referring Provider Dr. Cammy Copa       Encounter Date: 03/18/2023  Check In:  Session Check In - 03/18/23 1003       Check-In   Supervising physician immediately available to respond to emergencies See telemetry face sheet for immediately available ER MD    Location [facility]-Cardiac & Pulmonary Rehab    Staff Present Cyndia Diver, RN, BSN, Ronelle Nigh, BS, Exercise Physiologist    Virtual Visit No    Medication changes reported     No    Fall or balance concerns reported    No    Tobacco Cessation No Change    Warm-up and Cool-down Performed on first and last piece of equipment    Resistance Training Performed Yes    VAD Patient? No    PAD/SET Patient? No      Pain Assessment   Currently in Pain? 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 His dr has cleared him for CR, advising that if he gets short of breath or dizzy, he should stop. Pt instructed to exercise at low settings and let staff know of any changes. Pacemaker fires intermittently, HR remains mid 60's throughout class. Pt without c/o.    Social History   Tobacco Use  Smoking Status Former   Passive exposure: Past  Smokeless Tobacco Never  Tobacco Comments   In [employer], quit in 1960's    Goals Met:  Independence with exercise equipment Exercise tolerated well No report of concerns or symptoms today  Goals Unmet:  Not Applicable  Comments: Pt able to follow exercise prescription today without complaint.  Will continue to monitor for progression.    Dr. Bethann Punches is Medical Director for [facility] Cardiac Rehabilitation.  Dr. Vida Rigger is Medical Director for [facility] Pulmonary Rehabilitation.

## 2023-03-20 ENCOUNTER — Encounter: Payer: Self-pay | Admitting: *Deleted

## 2023-03-20 DIAGNOSIS — Z955 Presence of coronary angioplasty implant and graft: Secondary | ICD-10-CM

## 2023-03-20 NOTE — Progress Notes (Signed)
Cardiac Individual Treatment Plan  Patient Details  Name: [name] MRN: [id number] Date of Birth: 08-Feb-1933 Referring Provider:   Flowsheet Row Cardiac Rehab from 03/06/2023 in [facility] Cardiac and Pulmonary Rehab  Referring Provider Dr. Cammy Copa       Initial Encounter Date:  Flowsheet Row Cardiac Rehab from 03/06/2023 in [facility] Cardiac and Pulmonary Rehab  Date 03/06/23       Visit Diagnosis: Status post coronary artery stent placement  Patient's Home Medications on Admission:  Current Outpatient Medications:    acetaminophen (TYLENOL) 325 MG tablet, Take 650 mg by mouth every 6 (six) hours as needed. Reported on 01/17/2016, Disp: , Rfl:    ALPRAZolam (XANAX) 0.25 MG tablet, TAKE 1 TABLET(0.25 MG) BY MOUTH TWICE DAILY AS NEEDED FOR ANXIETY (Patient not taking: Reported on 02/28/2023), Disp: 60 tablet, Rfl: 0   apixaban (ELIQUIS) 2.5 MG TABS tablet, Take 2.5 mg by mouth 2 (two) times daily., Disp: , Rfl:    atorvastatin (LIPITOR) 80 MG tablet, TAKE ONE TABLET BY MOUTH AT BEDTIME FOR HIGH CHOLESTEROL, Disp: , Rfl:    clopidogrel (PLAVIX) 75 MG tablet, TAKE ONE TABLET BY MOUTH EVERY DAY FOR BLOOD CLOT PREVENTION FOLLOWING PCI BE SURE TO CHECK WITH YOUR DOCTOR ABOUT HOW LONG YOU SHOULD TAKE THIS MEDICINE, Disp: , Rfl:    diltiazem (TIAZAC) 180 MG 24 hr capsule, Take 180 mg by mouth daily. (Patient not taking: Reported on 02/28/2023), Disp: , Rfl:    finasteride (PROSCAR) 5 MG tablet, Take 1 tablet (5 mg total) by mouth daily., Disp: 90 tablet, Rfl: 3   Glucosamine 500 MG CAPS, Take by mouth daily.   (Patient not taking: Reported on 02/28/2023), Disp: , Rfl:    tamsulosin (FLOMAX) 0.4 MG CAPS capsule, TAKE ONE CAPSULE BY MOUTH EVERY DAY, Disp: 30 capsule, Rfl: 11   vitamin B-12 (CYANOCOBALAMIN) 500 MCG tablet, Take 1,000 mcg by mouth daily., Disp: , Rfl:   Past Medical History: Past Medical History:  Diagnosis Date   Acute diverticulitis 12/15   [facility]   Acute diverticulitis     Allergic rhinitis    Atrial fibrillation or flutter 12/10   Has been paroxysmal. Patient has not tolerated coumadin due to headaches. ECHO (12/10) Mild LVH, EF 55%,    BPH (benign prostatic hyperplasia)    Chest pain 12/10   Lexiscan myoview with EF 55%, no ischemia or infarction   Chronic constipation 01/10/2015   Dysrhythmia    AFIB   HOH (hard of hearing)    Hyperlipemia    Hypertension    Osteoarthritis    Pacemaker  medtronic 02/06/2010   Qualifier: Diagnosis of  By: Ladona Ridgel, MD, [facility], [place]    Presence of permanent cardiac pacemaker    Sick sinus syndrome (HCC)    S/P dual chamber Medtronic PCM   Sigmoid volvulus (HCC)     Tobacco Use: Social History   Tobacco Use  Smoking Status Former   Passive exposure: Past  Smokeless Tobacco Never  Tobacco Comments   In [employer], quit in 1960's    Labs: Review Flowsheet  More data exists      Latest Ref Rng & Units 12/01/2009 01/24/2011 04/29/2015 12/03/2016 10/13/2021  Labs for ITP Cardiac and Pulmonary Rehab  Cholestrol 0 - 200 mg/dL 409  811  914  782  -  LDL (calc) 0 - 99 mg/dL 956  - 213  086  -  Direct LDL mg/dL - 578.4  - - -  HDL-C >69.62 mg/dL  56  53.40  53.40  57.40  -  Trlycerides 0.0 - 149.0 mg/dL 62  16.1  09.6  04.5  -  Hemoglobin A1c 4.6 - 6.5 % - - - - 5.5      Exercise Target Goals: Exercise Program Goal: Individual exercise prescription set using results from initial 6 min walk test and THRR while considering  patient's activity barriers and safety.   Exercise Prescription Goal: Initial exercise prescription builds to 30-45 minutes a day of aerobic activity, 2-3 days per week.  Home exercise guidelines will be given to patient during program as part of exercise prescription that the participant will acknowledge.   Education: Aerobic Exercise: - Group verbal and visual presentation on the components of exercise prescription. Introduces F.I.T.T principle from ACSM for exercise prescriptions.  Reviews  F.I.T.T. principles of aerobic exercise including progression. Written material given at graduation. Flowsheet Row Cardiac Rehab from 03/13/2023 in [facility] Cardiac and Pulmonary Rehab  Education need identified 03/06/23  Date 03/13/23  Educator [facility]  Instruction Review Code 1- Verbalizes Understanding       Education: Resistance Exercise: - Group verbal and visual presentation on the components of exercise prescription. Introduces F.I.T.T principle from ACSM for exercise prescriptions  Reviews F.I.T.T. principles of resistance exercise including progression. Written material given at graduation.    Education: Exercise & Equipment Safety: - Individual verbal instruction and demonstration of equipment use and safety with use of the equipment. Flowsheet Row Cardiac Rehab from 03/13/2023 in [facility] Cardiac and Pulmonary Rehab  Date 03/06/23  Educator [facility]  Instruction Review Code 1- Verbalizes Understanding       Education: Exercise Physiology & General Exercise Guidelines: - Group verbal and written instruction with models to review the exercise physiology of the cardiovascular system and associated critical values. Provides general exercise guidelines with specific guidelines to those with heart or lung disease.  Flowsheet Row Cardiac Rehab from 03/13/2023 in [facility] Cardiac and Pulmonary Rehab  Education need identified 03/06/23       Education: Flexibility, Balance, Mind/Body Relaxation: - Group verbal and visual presentation with interactive activity on the components of exercise prescription. Introduces F.I.T.T principle from ACSM for exercise prescriptions. Reviews F.I.T.T. principles of flexibility and balance exercise training including progression. Also discusses the mind body connection.  Reviews various relaxation techniques to help reduce and manage stress (i.e. Deep breathing, progressive muscle relaxation, and visualization). Balance handout provided to take home. Written material given at  graduation.   Activity Barriers & Risk Stratification:  Activity Barriers & Cardiac Risk Stratification - 03/06/23 1336       Activity Barriers & Cardiac Risk Stratification   Activity Barriers Joint Problems;Muscular Weakness    Cardiac Risk Stratification Moderate             6 Minute Walk:  6 Minute Walk     Row Name 03/06/23 1332         6 Minute Walk   Phase Initial     Distance 155 feet     Walk Time 1.42 minutes     # of Rest Breaks 1     MPH 0.29     METS 1.97     RPE 13     Perceived Dyspnea  1     VO2 Peak 6.9     Symptoms Yes (comment)  weak legs-stopped test after 1:25     Comments weak legs-stopped test at 1:25     Resting HR 65 bpm     Resting BP  130/62     Resting Oxygen Saturation  98 %     Exercise Oxygen Saturation  during 6 min walk 99 %     Max Ex. HR 81 bpm     Max Ex. BP 138/70     2 Minute Post BP 128/64              Oxygen Initial Assessment:   Oxygen Re-Evaluation:   Oxygen Discharge (Final Oxygen Re-Evaluation):   Initial Exercise Prescription:  Initial Exercise Prescription - 03/06/23 1300       Date of Initial Exercise RX and Referring Provider   Date 03/06/23    Referring Provider Dr. Cammy Copa      Oxygen   Maintain Oxygen Saturation 88% or higher      Treadmill   MPH 0.5    Grade 0    Minutes 15   eventual goal of 15 min. Start with intervals, resting as often as needed.   METs 1.4      Recumbant Bike   Level 1    RPM 50    Watts 15    Minutes 15      NuStep   Level 1    SPM 80    Minutes 15    METs 1.97      Track   Laps 5    Minutes 15    METs 1.27      Prescription Details   Frequency (times per week) 3    Duration Progress to 30 minutes of continuous aerobic without signs/symptoms of physical distress      Intensity   THRR 40-80% of Max Heartrate 91-118    Ratings of Perceived Exertion 11-13    Perceived Dyspnea 0-4      Progression   Progression Continue to progress  workloads to maintain intensity without signs/symptoms of physical distress.      Resistance Training   Training Prescription Yes    Weight 4    Reps 10-15             Perform Capillary Blood Glucose checks as needed.  Exercise Prescription Changes:   Exercise Prescription Changes     Row Name 03/06/23 1300 03/11/23 1500           Response to Exercise   Blood Pressure (Admit) 130/62 126/64      Blood Pressure (Exercise) 138/70 130/60      Blood Pressure (Exit) 128/64 122/60      Heart Rate (Admit) 65 bpm 68 bpm      Heart Rate (Exercise) 81 bpm 67 bpm      Heart Rate (Exit) 65 bpm 57 bpm      Oxygen Saturation (Admit) 98 % --      Oxygen Saturation (Exercise) 99 % --      Oxygen Saturation (Exit) 99 % --      Rating of Perceived Exertion (Exercise) 13 16      Perceived Dyspnea (Exercise) 1 --      Symptoms weak legs- stopped test at 1:25 leg pain      Comments 6 MWT results First full day of exercise      Duration -- Progress to 30 minutes of  aerobic without signs/symptoms of physical distress      Intensity -- THRR unchanged        Progression   Progression -- Continue to progress workloads to maintain intensity without signs/symptoms of physical distress.      Average METs -- 1.8  Resistance Training   Training Prescription -- Yes      Weight -- 4      Reps -- 10-15        Interval Training   Interval Training -- No        NuStep   Level -- 1      Minutes -- 30      METs -- 1.8        Oxygen   Maintain Oxygen Saturation -- 88% or higher               Exercise Comments:   Exercise Comments     Row Name 03/08/23 1026           Exercise Comments First full day of exercise!  Patient was oriented to gym and equipment including functions, settings, policies, and procedures.  Patient's individual exercise prescription and treatment plan were reviewed.  All starting workloads were established based on the results of the 6 minute walk test  done at initial orientation visit.  The plan for exercise progression was also introduced and progression will be customized based on patient's performance and goals.                Exercise Goals and Review:   Exercise Goals     Row Name 03/06/23 1357             Exercise Goals   Increase Physical Activity Yes       Intervention Provide advice, education, support and counseling about physical activity/exercise needs.;Develop an individualized exercise prescription for aerobic and resistive training based on initial evaluation findings, risk stratification, comorbidities and participant's personal goals.       Expected Outcomes Short Term: Attend rehab on a regular basis to increase amount of physical activity.;Long Term: Add in home exercise to make exercise part of routine and to increase amount of physical activity.;Long Term: Exercising regularly at least 3-5 days a week.       Increase Strength and Stamina Yes       Intervention Provide advice, education, support and counseling about physical activity/exercise needs.;Develop an individualized exercise prescription for aerobic and resistive training based on initial evaluation findings, risk stratification, comorbidities and participant's personal goals.       Expected Outcomes Short Term: Increase workloads from initial exercise prescription for resistance, speed, and METs.;Short Term: Perform resistance training exercises routinely during rehab and add in resistance training at home;Long Term: Improve cardiorespiratory fitness, muscular endurance and strength as measured by increased METs and functional capacity ( )       Able to understand and use rate of perceived exertion (RPE) scale Yes       Intervention Provide education and explanation on how to use RPE scale       Expected Outcomes Short Term: Able to use RPE daily in rehab to express subjective intensity level;Long Term:  Able to use RPE to guide intensity level when  exercising independently       Able to understand and use Dyspnea scale Yes       Intervention Provide education and explanation on how to use Dyspnea scale       Expected Outcomes Short Term: Able to use Dyspnea scale daily in rehab to express subjective sense of shortness of breath during exertion;Long Term: Able to use Dyspnea scale to guide intensity level when exercising independently       Knowledge and understanding of Target Heart Rate Range (THRR) Yes  Intervention Provide education and explanation of THRR including how the numbers were predicted and where they are located for reference       Expected Outcomes Short Term: Able to state/look up THRR;Long Term: Able to use THRR to govern intensity when exercising independently;Short Term: Able to use daily as guideline for intensity in rehab       Able to check pulse independently Yes       Intervention Provide education and demonstration on how to check pulse in carotid and radial arteries.;Review the importance of being able to check your own pulse for safety during independent exercise       Expected Outcomes Short Term: Able to explain why pulse checking is important during independent exercise;Long Term: Able to check pulse independently and accurately       Understanding of Exercise Prescription Yes       Intervention Provide education, explanation, and written materials on patient's individual exercise prescription       Expected Outcomes Short Term: Able to explain program exercise prescription;Long Term: Able to explain home exercise prescription to exercise independently                Exercise Goals Re-Evaluation :  Exercise Goals Re-Evaluation     Row Name 03/08/23 1026 03/11/23 1507           Exercise Goal Re-Evaluation   Exercise Goals Review Able to understand and use rate of perceived exertion (RPE) scale;Able to understand and use Dyspnea scale;Knowledge and understanding of Target Heart Rate Range  (THRR);Understanding of Exercise Prescription Increase Physical Activity;Increase Strength and Stamina;Understanding of Exercise Prescription      Comments Reviewed RPE  and dyspnea scale, THR and program prescription with pt today.  Pt voiced understanding and was given a copy of goals to take home. [name] is off to a good start in the program. He did well on the T4 nustep at level 1 during his first session. He has not done any walking yet due to having pain and weakness in his legs. He did do well with 4 lb hand weights for resistance training as well. We will continue to monitor his progress in the program.      Expected Outcomes Short: Use RPE daily to regulate intensity. Long: Follow program prescription in THR. Short: Continue to follow current exercise prescription. Long: Continue to improve strength and stamina.               Discharge Exercise Prescription (Final Exercise Prescription Changes):  Exercise Prescription Changes - 03/11/23 1500       Response to Exercise   Blood Pressure (Admit) 126/64    Blood Pressure (Exercise) 130/60    Blood Pressure (Exit) 122/60    Heart Rate (Admit) 68 bpm    Heart Rate (Exercise) 67 bpm    Heart Rate (Exit) 57 bpm    Rating of Perceived Exertion (Exercise) 16    Symptoms leg pain    Comments First full day of exercise    Duration Progress to 30 minutes of  aerobic without signs/symptoms of physical distress    Intensity THRR unchanged      Progression   Progression Continue to progress workloads to maintain intensity without signs/symptoms of physical distress.    Average METs 1.8      Resistance Training   Training Prescription Yes    Weight 4    Reps 10-15      Interval Training   Interval Training No  NuStep   Level 1    Minutes 30    METs 1.8      Oxygen   Maintain Oxygen Saturation 88% or higher             Nutrition:  Target Goals: Understanding of nutrition guidelines, daily intake of sodium 1500mg ,  cholesterol 200mg , calories 30% from fat and 7% or less from saturated fats, daily to have 5 or more servings of fruits and vegetables.  Education: All About Nutrition: -Group instruction provided by verbal, written material, interactive activities, discussions, models, and posters to present general guidelines for heart healthy nutrition including fat, fiber, MyPlate, the role of sodium in heart healthy nutrition, utilization of the nutrition label, and utilization of this knowledge for meal planning. Follow up email sent as well. Written material given at graduation.   Biometrics:  Pre Biometrics - 03/06/23 1358       Pre Biometrics   Height 5' 9.5" (1.765 m)    Weight 146 lb 3.2 oz (66.3 kg)    Waist Circumference 34 inches    Hip Circumference 37.5 inches    Waist to Hip Ratio 0.91 %    BMI (Calculated) 21.29    Single Leg Stand 1.97 seconds              Nutrition Therapy Plan and Nutrition Goals:  Nutrition Therapy & Goals - 03/06/23 1400       Intervention Plan   Intervention Prescribe, educate and counsel regarding individualized specific dietary modifications aiming towards targeted core components such as weight, hypertension, lipid management, diabetes, heart failure and other comorbidities.    Expected Outcomes Short Term Goal: Understand basic principles of dietary content, such as calories, fat, sodium, cholesterol and nutrients.;Short Term Goal: A plan has been developed with personal nutrition goals set during dietitian appointment.;Long Term Goal: Adherence to prescribed nutrition plan.             Nutrition Assessments:  MEDIFICTS Score Key: ?70 Need to make dietary changes  40-70 Heart Healthy Diet ? 40 Therapeutic Level Cholesterol Diet  Flowsheet Row Cardiac Rehab from 03/06/2023 in [facility] Cardiac and Pulmonary Rehab  Picture Your Plate Total Score on Admission 39      Picture Your Plate Scores: <16 Unhealthy dietary pattern with much room for  improvement. 41-50 Dietary pattern unlikely to meet recommendations for good health and room for improvement. 51-60 More healthful dietary pattern, with some room for improvement.  >60 Healthy dietary pattern, although there may be some specific behaviors that could be improved.    Nutrition Goals Re-Evaluation:   Nutrition Goals Discharge (Final Nutrition Goals Re-Evaluation):   Psychosocial: Target Goals: Acknowledge presence or absence of significant depression and/or stress, maximize coping skills, provide positive support system. Participant is able to verbalize types and ability to use techniques and skills needed for reducing stress and depression.   Education: Stress, Anxiety, and Depression - Group verbal and visual presentation to define topics covered.  Reviews how body is impacted by stress, anxiety, and depression.  Also discusses healthy ways to reduce stress and to treat/manage anxiety and depression.  Written material given at graduation.   Education: Sleep Hygiene -Provides group verbal and written instruction about how sleep can affect your health.  Define sleep hygiene, discuss sleep cycles and impact of sleep habits. Review good sleep hygiene tips.    Initial Review & Psychosocial Screening:  Initial Psych Review & Screening - 02/28/23 1443       Initial Review  Current issues with Current Stress Concerns    Comments not feeling better after stents      Family Dynamics   Good Support System? Yes   wife     Barriers   Psychosocial barriers to participate in program There are no identifiable barriers or psychosocial needs.;The patient should benefit from training in stress management and relaxation.      Screening Interventions   Interventions Encouraged to exercise;Provide feedback about the scores to participant;To provide support and resources with identified psychosocial needs    Expected Outcomes Long Term Goal: Stressors or current issues are controlled  or eliminated.;Short Term goal: Utilizing psychosocial counselor, staff and physician to assist with identification of specific Stressors or current issues interfering with healing process. Setting desired goal for each stressor or current issue identified.;Short Term goal: Identification and review with participant of any Quality of Life or Depression concerns found by scoring the questionnaire.;Long Term goal: The participant improves quality of Life and PHQ9 Scores as seen by post scores and/or verbalization of changes             Quality of Life Scores:   Quality of Life - 03/06/23 1358       Quality of Life   Select Quality of Life      Quality of Life Scores   Health/Function Pre 15.2 %    Socioeconomic Pre 28.93 %    Psych/Spiritual Pre 21.21 %    Family Pre 30 %    GLOBAL Pre 21.44 %            Scores of 19 and below usually indicate a poorer quality of life in these areas.  A difference of  2-3 points is a clinically meaningful difference.  A difference of 2-3 points in the total score of the Quality of Life Index has been associated with significant improvement in overall quality of life, self-image, physical symptoms, and general health in studies assessing change in quality of life.  PHQ-9: Review Flowsheet  More data exists      03/06/2023 10/13/2021 08/26/2019 11/28/2016 04/29/2015  Depression screen PHQ 2/9  Decreased Interest 1 0 0 0 0  Down, Depressed, Hopeless 0 0 0 0 0  PHQ - 2 Score 1 0 0 0 0  Altered sleeping 0 - - - -  Tired, decreased energy 3 - - - -  Change in appetite 0 - - - -  Feeling bad or failure about yourself  1 - - - -  Trouble concentrating 0 - - - -  Moving slowly or fidgety/restless 0 - - - -  Suicidal thoughts 0 - - - -  PHQ-9 Score 5 - - - -  Difficult doing work/chores Somewhat difficult - - - -   Interpretation of Total Score  Total Score Depression Severity:  1-4 = Minimal depression, 5-9 = Mild depression, 10-14 = Moderate  depression, 15-19 = Moderately severe depression, 20-27 = Severe depression   Psychosocial Evaluation and Intervention:  Psychosocial Evaluation - 02/28/23 1446       Psychosocial Evaluation & Interventions   Interventions Encouraged to exercise with the program and follow exercise prescription;Stress management education    Comments [name] is coming to the program after stent placement. He states that he unfortunately is not feeling as well as he thought he would be after the stent. He still feels tired. He is trying to be patient with his health, but it is stressful that he still doesn't have energy. He  states he is eating and sleeping well.  He is hopeful this program will help boost his stamina. His wife is his main support system.    Expected Outcomes Short: attend cardiac rehab for education and exercise. Long: develop and maintain positive self care habits.    Continue Psychosocial Services  Follow up required by staff             Psychosocial Re-Evaluation:   Psychosocial Discharge (Final Psychosocial Re-Evaluation):   Vocational Rehabilitation: Provide vocational rehab assistance to qualifying candidates.   Vocational Rehab Evaluation & Intervention:  Vocational Rehab - 02/28/23 1446       Initial Vocational Rehab Evaluation & Intervention   Assessment shows need for Vocational Rehabilitation No             Education: Education Goals: Education classes will be provided on a variety of topics geared toward better understanding of heart health and risk factor modification. Participant will state understanding/return demonstration of topics presented as noted by education test scores.  Learning Barriers/Preferences:  Learning Barriers/Preferences - 02/28/23 1446       Learning Barriers/Preferences   Learning Barriers Hearing    Learning Preferences Individual Instruction             General Cardiac Education Topics:  AED/CPR: - Group verbal and  written instruction with the use of models to demonstrate the basic use of the AED with the basic ABC's of resuscitation.   Anatomy and Cardiac Procedures: - Group verbal and visual presentation and models provide information about basic cardiac anatomy and function. Reviews the testing methods done to diagnose heart disease and the outcomes of the test results. Describes the treatment choices: Medical Management, Angioplasty, or Coronary Bypass Surgery for treating various heart conditions including Myocardial Infarction, Angina, Valve Disease, and Cardiac Arrhythmias.  Written material given at graduation. Flowsheet Row Cardiac Rehab from 03/13/2023 in [facility] Cardiac and Pulmonary Rehab  Education need identified 03/06/23       Medication Safety: - Group verbal and visual instruction to review commonly prescribed medications for heart and lung disease. Reviews the medication, class of the drug, and side effects. Includes the steps to properly store meds and maintain the prescription regimen.  Written material given at graduation.   Intimacy: - Group verbal instruction through game format to discuss how heart and lung disease can affect sexual intimacy. Written material given at graduation.. Flowsheet Row Cardiac Rehab from 03/13/2023 in [facility] Cardiac and Pulmonary Rehab  Date 03/13/23  Educator [facility]  Instruction Review Code 1- Verbalizes Understanding       Know Your Numbers and Heart Failure: - Group verbal and visual instruction to discuss disease risk factors for cardiac and pulmonary disease and treatment options.  Reviews associated critical values for Overweight/Obesity, Hypertension, Cholesterol, and Diabetes.  Discusses basics of heart failure: signs/symptoms and treatments.  Introduces Heart Failure Zone chart for action plan for heart failure.  Written material given at graduation.   Infection Prevention: - Provides verbal and written material to individual with discussion of  infection control including proper hand washing and proper equipment cleaning during exercise session. Flowsheet Row Cardiac Rehab from 03/13/2023 in [facility] Cardiac and Pulmonary Rehab  Date 03/06/23  Educator [facility]  Instruction Review Code 1- Verbalizes Understanding       Falls Prevention: - Provides verbal and written material to individual with discussion of falls prevention and safety. Flowsheet Row Cardiac Rehab from 03/13/2023 in [facility] Cardiac and Pulmonary Rehab  Date 03/06/23  Educator [employer]  Instruction Review Code 1- Verbalizes Understanding       Other: -Provides group and verbal instruction on various topics (see comments)   Knowledge Questionnaire Score:  Knowledge Questionnaire Score - 03/06/23 1358       Knowledge Questionnaire Score   Pre Score 22/26             Core Components/Risk Factors/Patient Goals at Admission:  Personal Goals and Risk Factors at Admission - 03/06/23 1400       Core Components/Risk Factors/Patient Goals on Admission    Weight Management Yes    Intervention Weight Management: Develop a combined nutrition and exercise program designed to reach desired caloric intake, while maintaining appropriate intake of nutrient and fiber, sodium and fats, and appropriate energy expenditure required for the weight goal.;Weight Management: Provide education and appropriate resources to help participant work on and attain dietary goals.    Admit Weight 146 lb 3.2 oz (66.3 kg)    Goal Weight: Short Term 150 lb (68 kg)    Goal Weight: Long Term 155 lb (70.3 kg)    Expected Outcomes Short Term: Continue to assess and modify interventions until short term weight is achieved;Long Term: Adherence to nutrition and physical activity/exercise program aimed toward attainment of established weight goal;Weight Gain: Understanding of general recommendations for a high calorie, high protein meal plan that promotes weight gain by distributing calorie intake throughout the day  with the consumption for 4-5 meals, snacks, and/or supplements;Understanding of distribution of calorie intake throughout the day with the consumption of 4-5 meals/snacks;Understanding recommendations for meals to include 15-35% energy as protein, 25-35% energy from fat, 35-60% energy from carbohydrates, less than 200mg  of dietary cholesterol, 20-35 gm of total fiber daily    Hypertension Yes    Intervention Provide education on lifestyle modifcations including regular physical activity/exercise, weight management, moderate sodium restriction and increased consumption of fresh fruit, vegetables, and low fat dairy, alcohol moderation, and smoking cessation.;Monitor prescription use compliance.    Expected Outcomes Short Term: Continued assessment and intervention until BP is < 140/89mm HG in hypertensive participants. < 130/67mm HG in hypertensive participants with diabetes, heart failure or chronic kidney disease.;Long Term: Maintenance of blood pressure at goal levels.    Lipids Yes    Intervention Provide education and support for participant on nutrition & aerobic/resistive exercise along with prescribed medications to achieve LDL 70mg , HDL >40mg .    Expected Outcomes Short Term: Participant states understanding of desired cholesterol values and is compliant with medications prescribed. Participant is following exercise prescription and nutrition guidelines.;Long Term: Cholesterol controlled with medications as prescribed, with individualized exercise RX and with personalized nutrition plan. Value goals: LDL < 70mg , HDL > 40 mg.             Education:Diabetes - Individual verbal and written instruction to review signs/symptoms of diabetes, desired ranges of glucose level fasting, after meals and with exercise. Acknowledge that pre and post exercise glucose checks will be done for 3 sessions at entry of program.   Core Components/Risk Factors/Patient Goals Review:    Core Components/Risk  Factors/Patient Goals at Discharge (Final Review):    ITP Comments:  ITP Comments     Row Name 02/28/23 1433 03/06/23 1331 03/08/23 1026 03/20/23 0836     ITP Comments Initial phone call completed. Diagnosis can be found [facility] Paper[facility] in media tab. EP Orientation scheduled for Wednesday 5/29 10:30. Completed and gym orientation. Initial ITP created and sent for review to Dr. Bethann Punches, Medical  Director. First full day of exercise!  Patient was oriented to gym and equipment including functions, settings, policies, and procedures.  Patient's individual exercise prescription and treatment plan were reviewed.  All starting workloads were established based on the results of the 6 minute walk test done at initial orientation visit.  The plan for exercise progression was also introduced and progression will be customized based on patient's performance and goals. 30 Day review completed. Medical Director ITP review done, changes made as directed, and signed approval by Medical Director.   new to program             Comments:

## 2023-03-27 ENCOUNTER — Encounter: Payer: No Typology Code available for payment source | Admitting: *Deleted

## 2023-03-27 DIAGNOSIS — Z955 Presence of coronary angioplasty implant and graft: Secondary | ICD-10-CM

## 2023-03-27 NOTE — Progress Notes (Signed)
Daily Session Note  Patient Details  Name: [name] MRN: [id number] Date of Birth: 1933/06/27 Referring Provider:   Flowsheet Row Cardiac Rehab from 03/06/2023 in [facility] Cardiac and Pulmonary Rehab  Referring Provider Dr. Cammy Copa       Encounter Date: 03/27/2023  Check In:  Session Check In - 03/27/23 1004       Check-In   Supervising physician immediately available to respond to emergencies See telemetry face sheet for immediately available ER MD    Location [facility]-Cardiac & Pulmonary Rehab    Staff Present Lanny Hurst, RN, ADN;Joseph Reino Kent, RCP,RRT,BSRT;Kristen Coble, RN,BC,MSN    Virtual Visit No    Medication changes reported     No    Fall or balance concerns reported    No    Warm-up and Cool-down Performed on first and last piece of equipment    Resistance Training Performed Yes    VAD Patient? No    PAD/SET Patient? No      Pain Assessment   Currently in Pain? No/denies                Social History   Tobacco Use  Smoking Status Former   Passive exposure: Past  Smokeless Tobacco Never  Tobacco Comments   In [employer], quit in 1960's    Goals Met:  Independence with exercise equipment Exercise tolerated well No report of concerns or symptoms today Strength training completed today  Goals Unmet:  Not Applicable  Comments: Pt able to follow exercise prescription today without complaint.  Will continue to monitor for progression.    Dr. Bethann Punches is Medical Director for [facility] Cardiac Rehabilitation.  Dr. Vida Rigger is Medical Director for [facility] Pulmonary Rehabilitation.

## 2023-03-29 ENCOUNTER — Encounter: Payer: No Typology Code available for payment source | Admitting: *Deleted

## 2023-03-29 DIAGNOSIS — Z955 Presence of coronary angioplasty implant and graft: Secondary | ICD-10-CM | POA: Diagnosis not present

## 2023-03-29 NOTE — Progress Notes (Signed)
Daily Session Note  Patient Details  Name: [name] MRN: [id number] Date of Birth: May 25, 1933 Referring Provider:   Flowsheet Row Cardiac Rehab from 03/06/2023 in [facility] Cardiac and Pulmonary Rehab  Referring Provider Dr. Cammy Copa       Encounter Date: 03/29/2023  Check In:  Session Check In - 03/29/23 1002       Check-In   Supervising physician immediately available to respond to emergencies See telemetry face sheet for immediately available ER MD    Location [facility]-Cardiac & Pulmonary Rehab    Staff Present Cora Collum, RN, BSN, CCRP;Megan Katrinka Blazing, RN, [place];Other   [place] [facility]   Virtual Visit No    Medication changes reported     No    Fall or balance concerns reported    No    Warm-up and Cool-down Performed on first and last piece of equipment    Resistance Training Performed Yes    VAD Patient? No    PAD/SET Patient? No      Pain Assessment   Currently in Pain? No/denies                Social History   Tobacco Use  Smoking Status Former   Passive exposure: Past  Smokeless Tobacco Never  Tobacco Comments   In [employer], quit in 1960's    Goals Met:  Independence with exercise equipment Exercise tolerated well No report of concerns or symptoms today  Goals Unmet:  Not Applicable  Comments: Pt able to follow exercise prescription today without complaint.  Will continue to monitor for progression.    Dr. Bethann Punches is Medical Director for [facility] Cardiac Rehabilitation.  Dr. Vida Rigger is Medical Director for [facility] Pulmonary Rehabilitation.

## 2023-04-01 ENCOUNTER — Encounter: Payer: No Typology Code available for payment source | Admitting: *Deleted

## 2023-04-01 DIAGNOSIS — Z955 Presence of coronary angioplasty implant and graft: Secondary | ICD-10-CM | POA: Diagnosis not present

## 2023-04-01 NOTE — Progress Notes (Signed)
Daily Session Note  Patient Details  Name: [name] MRN: [id number] Date of Birth: Feb 08, 1933 Referring Provider:   Flowsheet Row Cardiac Rehab from 03/06/2023 in [facility] Cardiac and Pulmonary Rehab  Referring Provider Dr. Cammy Copa       Encounter Date: 04/01/2023  Check In:  Session Check In - 04/01/23 0933       Check-In   Supervising physician immediately available to respond to emergencies See telemetry face sheet for immediately available ER MD    Location [facility]-Cardiac & Pulmonary Rehab    Staff Present Lanny Hurst, RN, Franki Monte, BS, ACSM CEP, Exercise Physiologist;Kristen Coble, RN,BC,MSN    Virtual Visit No    Medication changes reported     No    Fall or balance concerns reported    No    Tobacco Cessation No Change    Warm-up and Cool-down Performed on first and last piece of equipment    Resistance Training Performed Yes    VAD Patient? No    PAD/SET Patient? No      Pain Assessment   Currently in Pain? No/denies                Social History   Tobacco Use  Smoking Status Former   Passive exposure: Past  Smokeless Tobacco Never  Tobacco Comments   In [employer], quit in 1960's    Goals Met:  Independence with exercise equipment Exercise tolerated well No report of concerns or symptoms today Strength training completed today  Goals Unmet:  Not Applicable  Comments: Pt able to follow exercise prescription today without complaint.  Will continue to monitor for progression.    Dr. Bethann Punches is Medical Director for [facility] Cardiac Rehabilitation.  Dr. Vida Rigger is Medical Director for [facility] Pulmonary Rehabilitation.

## 2023-04-03 ENCOUNTER — Encounter: Payer: No Typology Code available for payment source | Admitting: *Deleted

## 2023-04-15 ENCOUNTER — Encounter: Payer: No Typology Code available for payment source | Attending: Family Medicine

## 2023-04-15 DIAGNOSIS — Z955 Presence of coronary angioplasty implant and graft: Secondary | ICD-10-CM | POA: Insufficient documentation

## 2023-04-16 ENCOUNTER — Encounter: Payer: Self-pay | Admitting: *Deleted

## 2023-04-16 DIAGNOSIS — Z955 Presence of coronary angioplasty implant and graft: Secondary | ICD-10-CM

## 2023-04-16 NOTE — Progress Notes (Signed)
Cardiac Individual Treatment Plan  Patient Details  Name: [name] MRN: [id number] Date of Birth: 06-14-1933 Referring Provider:   Flowsheet Row Cardiac Rehab from 03/06/2023 in [facility] Cardiac and Pulmonary Rehab  Referring Provider Dr. Cammy Copa       Initial Encounter Date:  Flowsheet Row Cardiac Rehab from 03/06/2023 in [facility] Cardiac and Pulmonary Rehab  Date 03/06/23       Visit Diagnosis: Status post coronary artery stent placement  Patient's Home Medications on Admission:  Current Outpatient Medications:    acetaminophen (TYLENOL) 325 MG tablet, Take 650 mg by mouth every 6 (six) hours as needed. Reported on 01/17/2016, Disp: , Rfl:    ALPRAZolam (XANAX) 0.25 MG tablet, TAKE 1 TABLET(0.25 MG) BY MOUTH TWICE DAILY AS NEEDED FOR ANXIETY (Patient not taking: Reported on 02/28/2023), Disp: 60 tablet, Rfl: 0   apixaban (ELIQUIS) 2.5 MG TABS tablet, Take 2.5 mg by mouth 2 (two) times daily., Disp: , Rfl:    atorvastatin (LIPITOR) 80 MG tablet, TAKE ONE TABLET BY MOUTH AT BEDTIME FOR HIGH CHOLESTEROL, Disp: , Rfl:    clopidogrel (PLAVIX) 75 MG tablet, TAKE ONE TABLET BY MOUTH EVERY DAY FOR BLOOD CLOT PREVENTION FOLLOWING PCI BE SURE TO CHECK WITH YOUR DOCTOR ABOUT HOW LONG YOU SHOULD TAKE THIS MEDICINE, Disp: , Rfl:    diltiazem (TIAZAC) 180 MG 24 hr capsule, Take 180 mg by mouth daily. (Patient not taking: Reported on 02/28/2023), Disp: , Rfl:    finasteride (PROSCAR) 5 MG tablet, Take 1 tablet (5 mg total) by mouth daily., Disp: 90 tablet, Rfl: 3   Glucosamine 500 MG CAPS, Take by mouth daily.   (Patient not taking: Reported on 02/28/2023), Disp: , Rfl:    tamsulosin (FLOMAX) 0.4 MG CAPS capsule, TAKE ONE CAPSULE BY MOUTH EVERY DAY, Disp: 30 capsule, Rfl: 11   vitamin B-12 (CYANOCOBALAMIN) 500 MCG tablet, Take 1,000 mcg by mouth daily., Disp: , Rfl:   Past Medical History: Past Medical History:  Diagnosis Date   Acute diverticulitis 12/15   [facility]   Acute diverticulitis     Allergic rhinitis    Atrial fibrillation or flutter 12/10   Has been paroxysmal. Patient has not tolerated coumadin due to headaches. ECHO (12/10) Mild LVH, EF 55%,    BPH (benign prostatic hyperplasia)    Chest pain 12/10   Lexiscan myoview with EF 55%, no ischemia or infarction   Chronic constipation 01/10/2015   Dysrhythmia    AFIB   HOH (hard of hearing)    Hyperlipemia    Hypertension    Osteoarthritis    Pacemaker  medtronic 02/06/2010   Qualifier: Diagnosis of  By: Ladona Ridgel, MD, [facility], [place]    Presence of permanent cardiac pacemaker    Sick sinus syndrome (HCC)    S/P dual chamber Medtronic PCM   Sigmoid volvulus (HCC)     Tobacco Use: Social History   Tobacco Use  Smoking Status Former   Passive exposure: Past  Smokeless Tobacco Never  Tobacco Comments   In [employer], quit in 1960's    Labs: Review Flowsheet  More data exists      Latest Ref Rng & Units 12/01/2009 01/24/2011 04/29/2015 12/03/2016 10/13/2021  Labs for ITP Cardiac and Pulmonary Rehab  Cholestrol 0 - 200 mg/dL 409  811  914  782  -  LDL (calc) 0 - 99 mg/dL 956  - 213  086  -  Direct LDL mg/dL - 578.4  - - -  HDL-C >69.62 mg/dL  56  53.40  53.40  57.40  -  Trlycerides 0.0 - 149.0 mg/dL 62  78.2  95.6  21.3  -  Hemoglobin A1c 4.6 - 6.5 % - - - - 5.5      Exercise Target Goals: Exercise Program Goal: Individual exercise prescription set using results from initial 6 min walk test and THRR while considering  patient's activity barriers and safety.   Exercise Prescription Goal: Initial exercise prescription builds to 30-45 minutes a day of aerobic activity, 2-3 days per week.  Home exercise guidelines will be given to patient during program as part of exercise prescription that the participant will acknowledge.   Education: Aerobic Exercise: - Group verbal and visual presentation on the components of exercise prescription. Introduces F.I.T.T principle from ACSM for exercise prescriptions.  Reviews  F.I.T.T. principles of aerobic exercise including progression. Written material given at graduation. Flowsheet Row Cardiac Rehab from 03/27/2023 in [facility] Cardiac and Pulmonary Rehab  Education need identified 03/06/23  Date 03/13/23  Educator [facility]  Instruction Review Code 1- Verbalizes Understanding       Education: Resistance Exercise: - Group verbal and visual presentation on the components of exercise prescription. Introduces F.I.T.T principle from ACSM for exercise prescriptions  Reviews F.I.T.T. principles of resistance exercise including progression. Written material given at graduation. Flowsheet Row Cardiac Rehab from 03/27/2023 in [facility] Cardiac and Pulmonary Rehab  Date 03/27/23  Educator NT  Instruction Review Code 1- Verbalizes Understanding        Education: Exercise & Equipment Safety: - Individual verbal instruction and demonstration of equipment use and safety with use of the equipment. Flowsheet Row Cardiac Rehab from 03/27/2023 in [facility] Cardiac and Pulmonary Rehab  Date 03/06/23  Educator [facility]  Instruction Review Code 1- Verbalizes Understanding       Education: Exercise Physiology & General Exercise Guidelines: - Group verbal and written instruction with models to review the exercise physiology of the cardiovascular system and associated critical values. Provides general exercise guidelines with specific guidelines to those with heart or lung disease.  Flowsheet Row Cardiac Rehab from 03/27/2023 in [facility] Cardiac and Pulmonary Rehab  Education need identified 03/06/23       Education: Flexibility, Balance, Mind/Body Relaxation: - Group verbal and visual presentation with interactive activity on the components of exercise prescription. Introduces F.I.T.T principle from ACSM for exercise prescriptions. Reviews F.I.T.T. principles of flexibility and balance exercise training including progression. Also discusses the mind body connection.  Reviews various relaxation techniques  to help reduce and manage stress (i.e. Deep breathing, progressive muscle relaxation, and visualization). Balance handout provided to take home. Written material given at graduation. Flowsheet Row Cardiac Rehab from 03/27/2023 in [facility] Cardiac and Pulmonary Rehab  Date 03/27/23  Educator NT  Instruction Review Code 1- Verbalizes Understanding       Activity Barriers & Risk Stratification:  Activity Barriers & Cardiac Risk Stratification - 03/06/23 1336       Activity Barriers & Cardiac Risk Stratification   Activity Barriers Joint Problems;Muscular Weakness    Cardiac Risk Stratification Moderate             6 Minute Walk:  6 Minute Walk     Row Name 03/06/23 1332         6 Minute Walk   Phase Initial     Distance 155 feet     Walk Time 1.42 minutes     # of Rest Breaks 1     MPH 0.29     METS 1.97  RPE 13     Perceived Dyspnea  1     VO2 Peak 6.9     Symptoms Yes (comment)  weak legs-stopped test after 1:25     Comments weak legs-stopped test at 1:25     Resting HR 65 bpm     Resting BP 130/62     Resting Oxygen Saturation  98 %     Exercise Oxygen Saturation  during 6 min walk 99 %     Max Ex. HR 81 bpm     Max Ex. BP 138/70     2 Minute Post BP 128/64              Oxygen Initial Assessment:   Oxygen Re-Evaluation:   Oxygen Discharge (Final Oxygen Re-Evaluation):   Initial Exercise Prescription:  Initial Exercise Prescription - 03/06/23 1300       Date of Initial Exercise RX and Referring Provider   Date 03/06/23    Referring Provider Dr. Cammy Copa      Oxygen   Maintain Oxygen Saturation 88% or higher      Treadmill   MPH 0.5    Grade 0    Minutes 15   eventual goal of 15 min. Start with intervals, resting as often as needed.   METs 1.4      Recumbant Bike   Level 1    RPM 50    Watts 15    Minutes 15      NuStep   Level 1    SPM 80    Minutes 15    METs 1.97      Track   Laps 5    Minutes 15    METs 1.27       Prescription Details   Frequency (times per week) 3    Duration Progress to 30 minutes of continuous aerobic without signs/symptoms of physical distress      Intensity   THRR 40-80% of Max Heartrate 91-118    Ratings of Perceived Exertion 11-13    Perceived Dyspnea 0-4      Progression   Progression Continue to progress workloads to maintain intensity without signs/symptoms of physical distress.      Resistance Training   Training Prescription Yes    Weight 4    Reps 10-15             Perform Capillary Blood Glucose checks as needed.  Exercise Prescription Changes:   Exercise Prescription Changes     Row Name 03/06/23 1300 03/11/23 1500 03/27/23 1400 04/10/23 0800       Response to Exercise   Blood Pressure (Admit) 130/62 126/64 122/66 122/70    Blood Pressure (Exercise) 138/70 130/60 128/66 118/60    Blood Pressure (Exit) 128/64 122/60 124/62 114/60    Heart Rate (Admit) 65 bpm 68 bpm 72 bpm 69 bpm    Heart Rate (Exercise) 81 bpm 67 bpm 74 bpm 96 bpm    Heart Rate (Exit) 65 bpm 57 bpm 65 bpm 71 bpm    Oxygen Saturation (Admit) 98 % -- -- --    Oxygen Saturation (Exercise) 99 % -- -- --    Oxygen Saturation (Exit) 99 % -- -- --    Rating of Perceived Exertion (Exercise) 13 16 15 15     Perceived Dyspnea (Exercise) 1 -- -- --    Symptoms weak legs- stopped test at 1:25 leg pain leg pain leg pain    Comments 6 MWT results First full day of  exercise -- --    Duration -- Progress to 30 minutes of  aerobic without signs/symptoms of physical distress Progress to 30 minutes of  aerobic without signs/symptoms of physical distress Progress to 30 minutes of  aerobic without signs/symptoms of physical distress    Intensity -- THRR unchanged THRR unchanged THRR unchanged      Progression   Progression -- Continue to progress workloads to maintain intensity without signs/symptoms of physical distress. Continue to progress workloads to maintain intensity without signs/symptoms  of physical distress. Continue to progress workloads to maintain intensity without signs/symptoms of physical distress.    Average METs -- 1.8 2.26 1.92      Resistance Training   Training Prescription -- Yes Yes Yes    Weight -- 4 4 lb 4 lb    Reps -- 10-15 10-15 10-15      Interval Training   Interval Training -- No No No      NuStep   Level -- 1 1 1     Minutes -- 30 30 30     METs -- 1.8 3.7 2.1      T5 Nustep   Level -- -- 1 1    Minutes -- -- 30 30    METs -- -- 2 2      Oxygen   Maintain Oxygen Saturation -- 88% or higher 88% or higher 88% or higher             Exercise Comments:   Exercise Comments     Row Name 03/08/23 1026           Exercise Comments First full day of exercise!  Patient was oriented to gym and equipment including functions, settings, policies, and procedures.  Patient's individual exercise prescription and treatment plan were reviewed.  All starting workloads were established based on the results of the 6 minute walk test done at initial orientation visit.  The plan for exercise progression was also introduced and progression will be customized based on patient's performance and goals.                Exercise Goals and Review:   Exercise Goals     Row Name 03/06/23 1357             Exercise Goals   Increase Physical Activity Yes       Intervention Provide advice, education, support and counseling about physical activity/exercise needs.;Develop an individualized exercise prescription for aerobic and resistive training based on initial evaluation findings, risk stratification, comorbidities and participant's personal goals.       Expected Outcomes Short Term: Attend rehab on a regular basis to increase amount of physical activity.;Long Term: Add in home exercise to make exercise part of routine and to increase amount of physical activity.;Long Term: Exercising regularly at least 3-5 days a week.       Increase Strength and Stamina  Yes       Intervention Provide advice, education, support and counseling about physical activity/exercise needs.;Develop an individualized exercise prescription for aerobic and resistive training based on initial evaluation findings, risk stratification, comorbidities and participant's personal goals.       Expected Outcomes Short Term: Increase workloads from initial exercise prescription for resistance, speed, and METs.;Short Term: Perform resistance training exercises routinely during rehab and add in resistance training at home;Long Term: Improve cardiorespiratory fitness, muscular endurance and strength as measured by increased METs and functional capacity ( )       Able to understand and use rate  of perceived exertion (RPE) scale Yes       Intervention Provide education and explanation on how to use RPE scale       Expected Outcomes Short Term: Able to use RPE daily in rehab to express subjective intensity level;Long Term:  Able to use RPE to guide intensity level when exercising independently       Able to understand and use Dyspnea scale Yes       Intervention Provide education and explanation on how to use Dyspnea scale       Expected Outcomes Short Term: Able to use Dyspnea scale daily in rehab to express subjective sense of shortness of breath during exertion;Long Term: Able to use Dyspnea scale to guide intensity level when exercising independently       Knowledge and understanding of Target Heart Rate Range (THRR) Yes       Intervention Provide education and explanation of THRR including how the numbers were predicted and where they are located for reference       Expected Outcomes Short Term: Able to state/look up THRR;Long Term: Able to use THRR to govern intensity when exercising independently;Short Term: Able to use daily as guideline for intensity in rehab       Able to check pulse independently Yes       Intervention Provide education and demonstration on how to check pulse in  carotid and radial arteries.;Review the importance of being able to check your own pulse for safety during independent exercise       Expected Outcomes Short Term: Able to explain why pulse checking is important during independent exercise;Long Term: Able to check pulse independently and accurately       Understanding of Exercise Prescription Yes       Intervention Provide education, explanation, and written materials on patient's individual exercise prescription       Expected Outcomes Short Term: Able to explain program exercise prescription;Long Term: Able to explain home exercise prescription to exercise independently                Exercise Goals Re-Evaluation :  Exercise Goals Re-Evaluation     Row Name 03/08/23 1026 03/11/23 1507 03/27/23 1437 04/10/23 0819       Exercise Goal Re-Evaluation   Exercise Goals Review Able to understand and use rate of perceived exertion (RPE) scale;Able to understand and use Dyspnea scale;Knowledge and understanding of Target Heart Rate Range (THRR);Understanding of Exercise Prescription Increase Physical Activity;Increase Strength and Stamina;Understanding of Exercise Prescription Increase Physical Activity;Increase Strength and Stamina;Understanding of Exercise Prescription Increase Physical Activity;Increase Strength and Stamina;Understanding of Exercise Prescription    Comments Reviewed RPE  and dyspnea scale, THR and program prescription with pt today.  Pt voiced understanding and was given a copy of goals to take home. [name] is off to a good start in the program. He did well on the T4 nustep at level 1 during his first session. He has not done any walking yet due to having pain and weakness in his legs. He did do well with 4 lb hand weights for resistance training as well. We will continue to monitor his progress in the program. [name] is doing well in rehab. He has been doing 30 minutes at level 1 on either the T4 nustep or the T5 nustep while in the  program. He also has continued to do well with 4 lb hand weights for resistance training. He has not been doing any walking in rehab. We will continue to  monitor his progress in the program. [name] is doing well in rehab. He has been doing 30 minutes on both the T4 nustep and T5 nustep. He has not started doing any walking in the program due to leg pain. We will continue to monitor his progress in the program.    Expected Outcomes Short: Use RPE daily to regulate intensity. Long: Follow program prescription in THR. Short: Continue to follow current exercise prescription. Long: Continue to improve strength and stamina. Short: Try level 2 on T4 nustep. Long: Continue to improve strength and stamina. Short: Try level 2 on T4 nustep. Long: Continue to improve strength and stamina.             Discharge Exercise Prescription (Final Exercise Prescription Changes):  Exercise Prescription Changes - 04/10/23 0800       Response to Exercise   Blood Pressure (Admit) 122/70    Blood Pressure (Exercise) 118/60    Blood Pressure (Exit) 114/60    Heart Rate (Admit) 69 bpm    Heart Rate (Exercise) 96 bpm    Heart Rate (Exit) 71 bpm    Rating of Perceived Exertion (Exercise) 15    Symptoms leg pain    Duration Progress to 30 minutes of  aerobic without signs/symptoms of physical distress    Intensity THRR unchanged      Progression   Progression Continue to progress workloads to maintain intensity without signs/symptoms of physical distress.    Average METs 1.92      Resistance Training   Training Prescription Yes    Weight 4 lb    Reps 10-15      Interval Training   Interval Training No      NuStep   Level 1    Minutes 30    METs 2.1      T5 Nustep   Level 1    Minutes 30    METs 2      Oxygen   Maintain Oxygen Saturation 88% or higher             Nutrition:  Target Goals: Understanding of nutrition guidelines, daily intake of sodium 1500mg , cholesterol 200mg , calories 30%  from fat and 7% or less from saturated fats, daily to have 5 or more servings of fruits and vegetables.  Education: All About Nutrition: -Group instruction provided by verbal, written material, interactive activities, discussions, models, and posters to present general guidelines for heart healthy nutrition including fat, fiber, MyPlate, the role of sodium in heart healthy nutrition, utilization of the nutrition label, and utilization of this knowledge for meal planning. Follow up email sent as well. Written material given at graduation.   Biometrics:  Pre Biometrics - 03/06/23 1358       Pre Biometrics   Height 5' 9.5" (1.765 m)    Weight 146 lb 3.2 oz (66.3 kg)    Waist Circumference 34 inches    Hip Circumference 37.5 inches    Waist to Hip Ratio 0.91 %    BMI (Calculated) 21.29    Single Leg Stand 1.97 seconds              Nutrition Therapy Plan and Nutrition Goals:  Nutrition Therapy & Goals - 03/06/23 1400       Intervention Plan   Intervention Prescribe, educate and counsel regarding individualized specific dietary modifications aiming towards targeted core components such as weight, hypertension, lipid management, diabetes, heart failure and other comorbidities.    Expected Outcomes Short Term Goal: Understand basic  principles of dietary content, such as calories, fat, sodium, cholesterol and nutrients.;Short Term Goal: A plan has been developed with personal nutrition goals set during dietitian appointment.;Long Term Goal: Adherence to prescribed nutrition plan.             Nutrition Assessments:  MEDIFICTS Score Key: ?70 Need to make dietary changes  40-70 Heart Healthy Diet ? 40 Therapeutic Level Cholesterol Diet  Flowsheet Row Cardiac Rehab from 03/06/2023 in [facility] Cardiac and Pulmonary Rehab  Picture Your Plate Total Score on Admission 39      Picture Your Plate Scores: <82 Unhealthy dietary pattern with much room for improvement. 41-50 Dietary pattern  unlikely to meet recommendations for good health and room for improvement. 51-60 More healthful dietary pattern, with some room for improvement.  >60 Healthy dietary pattern, although there may be some specific behaviors that could be improved.    Nutrition Goals Re-Evaluation:   Nutrition Goals Discharge (Final Nutrition Goals Re-Evaluation):   Psychosocial: Target Goals: Acknowledge presence or absence of significant depression and/or stress, maximize coping skills, provide positive support system. Participant is able to verbalize types and ability to use techniques and skills needed for reducing stress and depression.   Education: Stress, Anxiety, and Depression - Group verbal and visual presentation to define topics covered.  Reviews how body is impacted by stress, anxiety, and depression.  Also discusses healthy ways to reduce stress and to treat/manage anxiety and depression.  Written material given at graduation.   Education: Sleep Hygiene -Provides group verbal and written instruction about how sleep can affect your health.  Define sleep hygiene, discuss sleep cycles and impact of sleep habits. Review good sleep hygiene tips.    Initial Review & Psychosocial Screening:  Initial Psych Review & Screening - 02/28/23 1443       Initial Review   Current issues with Current Stress Concerns    Comments not feeling better after stents      Family Dynamics   Good Support System? Yes   wife     Barriers   Psychosocial barriers to participate in program There are no identifiable barriers or psychosocial needs.;The patient should benefit from training in stress management and relaxation.      Screening Interventions   Interventions Encouraged to exercise;Provide feedback about the scores to participant;To provide support and resources with identified psychosocial needs    Expected Outcomes Long Term Goal: Stressors or current issues are controlled or eliminated.;Short Term goal:  Utilizing psychosocial counselor, staff and physician to assist with identification of specific Stressors or current issues interfering with healing process. Setting desired goal for each stressor or current issue identified.;Short Term goal: Identification and review with participant of any Quality of Life or Depression concerns found by scoring the questionnaire.;Long Term goal: The participant improves quality of Life and PHQ9 Scores as seen by post scores and/or verbalization of changes             Quality of Life Scores:   Quality of Life - 03/06/23 1358       Quality of Life   Select Quality of Life      Quality of Life Scores   Health/Function Pre 15.2 %    Socioeconomic Pre 28.93 %    Psych/Spiritual Pre 21.21 %    Family Pre 30 %    GLOBAL Pre 21.44 %            Scores of 19 and below usually indicate a poorer quality of life in these  areas.  A difference of  2-3 points is a clinically meaningful difference.  A difference of 2-3 points in the total score of the Quality of Life Index has been associated with significant improvement in overall quality of life, self-image, physical symptoms, and general health in studies assessing change in quality of life.  PHQ-9: Review Flowsheet  More data exists      03/06/2023 10/13/2021 08/26/2019 11/28/2016 04/29/2015  Depression screen PHQ 2/9  Decreased Interest 1 0 0 0 0  Down, Depressed, Hopeless 0 0 0 0 0  PHQ - 2 Score 1 0 0 0 0  Altered sleeping 0 - - - -  Tired, decreased energy 3 - - - -  Change in appetite 0 - - - -  Feeling bad or failure about yourself  1 - - - -  Trouble concentrating 0 - - - -  Moving slowly or fidgety/restless 0 - - - -  Suicidal thoughts 0 - - - -  PHQ-9 Score 5 - - - -  Difficult doing work/chores Somewhat difficult - - - -   Interpretation of Total Score  Total Score Depression Severity:  1-4 = Minimal depression, 5-9 = Mild depression, 10-14 = Moderate depression, 15-19 = Moderately severe  depression, 20-27 = Severe depression   Psychosocial Evaluation and Intervention:  Psychosocial Evaluation - 02/28/23 1446       Psychosocial Evaluation & Interventions   Interventions Encouraged to exercise with the program and follow exercise prescription;Stress management education    Comments [name] is coming to the program after stent placement. He states that he unfortunately is not feeling as well as he thought he would be after the stent. He still feels tired. He is trying to be patient with his health, but it is stressful that he still doesn't have energy. He states he is eating and sleeping well.  He is hopeful this program will help boost his stamina. His wife is his main support system.    Expected Outcomes Short: attend cardiac rehab for education and exercise. Long: develop and maintain positive self care habits.    Continue Psychosocial Services  Follow up required by staff             Psychosocial Re-Evaluation:   Psychosocial Discharge (Final Psychosocial Re-Evaluation):   Vocational Rehabilitation: Provide vocational rehab assistance to qualifying candidates.   Vocational Rehab Evaluation & Intervention:  Vocational Rehab - 02/28/23 1446       Initial Vocational Rehab Evaluation & Intervention   Assessment shows need for Vocational Rehabilitation No             Education: Education Goals: Education classes will be provided on a variety of topics geared toward better understanding of heart health and risk factor modification. Participant will state understanding/return demonstration of topics presented as noted by education test scores.  Learning Barriers/Preferences:  Learning Barriers/Preferences - 02/28/23 1446       Learning Barriers/Preferences   Learning Barriers Hearing    Learning Preferences Individual Instruction             General Cardiac Education Topics:  AED/CPR: - Group verbal and written instruction with the use of  models to demonstrate the basic use of the AED with the basic ABC's of resuscitation.   Anatomy and Cardiac Procedures: - Group verbal and visual presentation and models provide information about basic cardiac anatomy and function. Reviews the testing methods done to diagnose heart disease and the outcomes of the test results.  Describes the treatment choices: Medical Management, Angioplasty, or Coronary Bypass Surgery for treating various heart conditions including Myocardial Infarction, Angina, Valve Disease, and Cardiac Arrhythmias.  Written material given at graduation. Flowsheet Row Cardiac Rehab from 03/27/2023 in [facility] Cardiac and Pulmonary Rehab  Education need identified 03/06/23       Medication Safety: - Group verbal and visual instruction to review commonly prescribed medications for heart and lung disease. Reviews the medication, class of the drug, and side effects. Includes the steps to properly store meds and maintain the prescription regimen.  Written material given at graduation.   Intimacy: - Group verbal instruction through game format to discuss how heart and lung disease can affect sexual intimacy. Written material given at graduation.. Flowsheet Row Cardiac Rehab from 03/27/2023 in [facility] Cardiac and Pulmonary Rehab  Date 03/13/23  Educator [facility]  Instruction Review Code 1- Verbalizes Understanding       Know Your Numbers and Heart Failure: - Group verbal and visual instruction to discuss disease risk factors for cardiac and pulmonary disease and treatment options.  Reviews associated critical values for Overweight/Obesity, Hypertension, Cholesterol, and Diabetes.  Discusses basics of heart failure: signs/symptoms and treatments.  Introduces Heart Failure Zone chart for action plan for heart failure.  Written material given at graduation.   Infection Prevention: - Provides verbal and written material to individual with discussion of infection control including proper hand  washing and proper equipment cleaning during exercise session. Flowsheet Row Cardiac Rehab from 03/27/2023 in [facility] Cardiac and Pulmonary Rehab  Date 03/06/23  Educator [facility]  Instruction Review Code 1- Verbalizes Understanding       Falls Prevention: - Provides verbal and written material to individual with discussion of falls prevention and safety. Flowsheet Row Cardiac Rehab from 03/27/2023 in [facility] Cardiac and Pulmonary Rehab  Date 03/06/23  Educator [facility]  Instruction Review Code 1- Verbalizes Understanding       Other: -Provides group and verbal instruction on various topics (see comments)   Knowledge Questionnaire Score:  Knowledge Questionnaire Score - 03/06/23 1358       Knowledge Questionnaire Score   Pre Score 22/26             Core Components/Risk Factors/Patient Goals at Admission:  Personal Goals and Risk Factors at Admission - 03/06/23 1400       Core Components/Risk Factors/Patient Goals on Admission    Weight Management Yes    Intervention Weight Management: Develop a combined nutrition and exercise program designed to reach desired caloric intake, while maintaining appropriate intake of nutrient and fiber, sodium and fats, and appropriate energy expenditure required for the weight goal.;Weight Management: Provide education and appropriate resources to help participant work on and attain dietary goals.    Admit Weight 146 lb 3.2 oz (66.3 kg)    Goal Weight: Short Term 150 lb (68 kg)    Goal Weight: Long Term 155 lb (70.3 kg)    Expected Outcomes Short Term: Continue to assess and modify interventions until short term weight is achieved;Long Term: Adherence to nutrition and physical activity/exercise program aimed toward attainment of established weight goal;Weight Gain: Understanding of general recommendations for a high calorie, high protein meal plan that promotes weight gain by distributing calorie intake throughout the day with the consumption for 4-5 meals,  snacks, and/or supplements;Understanding of distribution of calorie intake throughout the day with the consumption of 4-5 meals/snacks;Understanding recommendations for meals to include 15-35% energy as protein, 25-35% energy from fat, 35-60% energy from carbohydrates, less than  200mg  of dietary cholesterol, 20-35 gm of total fiber daily    Hypertension Yes    Intervention Provide education on lifestyle modifcations including regular physical activity/exercise, weight management, moderate sodium restriction and increased consumption of fresh fruit, vegetables, and low fat dairy, alcohol moderation, and smoking cessation.;Monitor prescription use compliance.    Expected Outcomes Short Term: Continued assessment and intervention until BP is < 140/17mm HG in hypertensive participants. < 130/18mm HG in hypertensive participants with diabetes, heart failure or chronic kidney disease.;Long Term: Maintenance of blood pressure at goal levels.    Lipids Yes    Intervention Provide education and support for participant on nutrition & aerobic/resistive exercise along with prescribed medications to achieve LDL 70mg , HDL >40mg .    Expected Outcomes Short Term: Participant states understanding of desired cholesterol values and is compliant with medications prescribed. Participant is following exercise prescription and nutrition guidelines.;Long Term: Cholesterol controlled with medications as prescribed, with individualized exercise RX and with personalized nutrition plan. Value goals: LDL < 70mg , HDL > 40 mg.             Education:Diabetes - Individual verbal and written instruction to review signs/symptoms of diabetes, desired ranges of glucose level fasting, after meals and with exercise. Acknowledge that pre and post exercise glucose checks will be done for 3 sessions at entry of program.   Core Components/Risk Factors/Patient Goals Review:    Core Components/Risk Factors/Patient Goals at Discharge  (Final Review):    ITP Comments:  ITP Comments     Row Name 02/28/23 1433 03/06/23 1331 03/08/23 1026 03/20/23 0836 04/16/23 1413   ITP Comments Initial phone call completed. Diagnosis can be found in [facility] in media tab. EP Orientation scheduled for Wednesday 5/29 10:30. Completed and gym orientation. Initial ITP created and sent for review to Dr. Bethann Punches, Medical Director. First full day of exercise!  Patient was oriented to gym and equipment including functions, settings, policies, and procedures.  Patient's individual exercise prescription and treatment plan were reviewed.  All starting workloads were established based on the results of the 6 minute walk test done at initial orientation visit.  The plan for exercise progression was also introduced and progression will be customized based on patient's performance and goals. 30 Day review completed. Medical Director ITP review done, changes made as directed, and signed approval by Medical Director.   new to program 30 Day review completed. Medical Director ITP review done, changes made as directed, and signed approval by Medical Director.    last visit 6/24            Comments:

## 2023-04-17 ENCOUNTER — Encounter: Payer: No Typology Code available for payment source | Admitting: *Deleted

## 2023-04-17 DIAGNOSIS — Z955 Presence of coronary angioplasty implant and graft: Secondary | ICD-10-CM

## 2023-04-17 NOTE — Progress Notes (Signed)
Daily Session Note  Patient Details  Name: [name] MRN: [id number] Date of Birth: 01-23-1933 Referring Provider:   Flowsheet Row Cardiac Rehab from 03/06/2023 in [facility] Cardiac and Pulmonary Rehab  Referring Provider Dr. Cammy Copa       Encounter Date: 04/17/2023  Check In:  Session Check In - 04/17/23 0941       Check-In   Supervising physician immediately available to respond to emergencies See telemetry face sheet for immediately available ER MD    Location [facility]-Cardiac & Pulmonary Rehab    Staff Present Lanny Hurst, RN, ADN;Kristen Coble, RN,BC,MSN;Krista Karleen Hampshire RN, BSN    Virtual Visit No    Medication changes reported     No    Fall or balance concerns reported    Yes    Comments Larey Seat with no injuries, did not hit head    Warm-up and Cool-down Performed on first and last piece of equipment    Resistance Training Performed Yes    VAD Patient? No    PAD/SET Patient? No      Pain Assessment   Currently in Pain? No/denies                Social History   Tobacco Use  Smoking Status Former   Passive exposure: Past  Smokeless Tobacco Never  Tobacco Comments   In [employer], quit in 1960's    Goals Met:  Independence with exercise equipment Exercise tolerated well No report of concerns or symptoms today Strength training completed today  Goals Unmet:  Not Applicable  Comments: Pt able to follow exercise prescription today without complaint.  Will continue to monitor for progression.    Dr. Bethann Punches is Medical Director for [facility] Cardiac Rehabilitation.  Dr. Vida Rigger is Medical Director for [facility] Pulmonary Rehabilitation.

## 2023-04-19 ENCOUNTER — Encounter: Payer: No Typology Code available for payment source | Admitting: *Deleted

## 2023-04-19 DIAGNOSIS — Z955 Presence of coronary angioplasty implant and graft: Secondary | ICD-10-CM | POA: Diagnosis not present

## 2023-04-19 NOTE — Progress Notes (Signed)
Daily Session Note  Patient Details  Name: ZEUS PROPP MRN: [id number] Date of Birth: 19-Aug-1933 Referring Provider:   Flowsheet Row Cardiac Rehab from 03/06/2023 in [facility] Cardiac and Pulmonary Rehab  Referring Provider Dr. Cammy Copa       Encounter Date: 04/19/2023  Check In:  Session Check In - 04/19/23 0955       Check-In   Supervising physician immediately available to respond to emergencies See telemetry face sheet for immediately available ER MD    Location [facility]-Cardiac & Pulmonary Rehab    Staff Present Cora Collum, RN, BSN, CCRP;Joseph Hood, RCP,RRT,BSRT;Kristen Hereford, RN,BC,MSN    Virtual Visit No    Medication changes reported     No    Fall or balance concerns reported    No    Warm-up and Cool-down Performed on first and last piece of equipment    Resistance Training Performed Yes    VAD Patient? No    PAD/SET Patient? No      Pain Assessment   Currently in Pain? No/denies                Social History   Tobacco Use  Smoking Status Former   Passive exposure: Past  Smokeless Tobacco Never  Tobacco Comments   In [employer], quit in 1960's    Goals Met:  Independence with exercise equipment Exercise tolerated well No report of concerns or symptoms today  Goals Unmet:  Not Applicable  Comments: Pt able to follow exercise prescription today without complaint.  Will continue to monitor for progression.    Dr. Bethann Punches is Medical Director for [facility] Cardiac Rehabilitation.  Dr. Vida Rigger is Medical Director for [facility] Pulmonary Rehabilitation.

## 2023-04-22 ENCOUNTER — Encounter: Payer: No Typology Code available for payment source | Admitting: *Deleted

## 2023-04-24 ENCOUNTER — Encounter: Payer: No Typology Code available for payment source | Admitting: *Deleted

## 2023-04-25 ENCOUNTER — Telehealth: Payer: Self-pay | Admitting: *Deleted

## 2023-04-25 NOTE — Telephone Encounter (Signed)
Spoke with pt. He will be changing to two days a week in rehab. Appointments updated. Pt will resume rehab next week as scheduled.

## 2023-04-29 ENCOUNTER — Encounter: Payer: No Typology Code available for payment source | Admitting: *Deleted

## 2023-05-06 ENCOUNTER — Encounter: Payer: No Typology Code available for payment source | Admitting: *Deleted

## 2023-05-06 ENCOUNTER — Telehealth: Payer: Self-pay | Admitting: *Deleted

## 2023-05-06 ENCOUNTER — Encounter: Payer: Self-pay | Admitting: *Deleted

## 2023-05-06 NOTE — Telephone Encounter (Signed)
Called and spoke with [name], he was not at rehab today. He has continued decline in strength and states he can hardly walk. [facility] [name] says he is unable to complete rehab program at this time until they can figure out why he is losing his strength. Will discharge from the program at this time, and [name] understands he may restart in the future with a new referral if able.

## 2023-05-08 ENCOUNTER — Telehealth: Payer: Self-pay

## 2023-05-08 NOTE — Telephone Encounter (Signed)
Please see subsequent telephone note from today 7/31 for plan.

## 2023-05-08 NOTE — Telephone Encounter (Signed)
[name] [name] called today with [name] and requested to be placed on a medical hold. Pt will be attending a long term rehab facility for 30 days but would like to return to our program upon completion of his time there. We infromed Pt and Pt's daughter that he will need to keep [place] updated on when he plans to return to cardiac rehab. We will place Pt on medical hold at this time.

## 2023-05-14 DIAGNOSIS — I4819 Other persistent atrial fibrillation: Secondary | ICD-10-CM | POA: Diagnosis not present

## 2023-05-14 DIAGNOSIS — R262 Difficulty in walking, not elsewhere classified: Secondary | ICD-10-CM | POA: Diagnosis not present

## 2023-05-14 DIAGNOSIS — M6281 Muscle weakness (generalized): Secondary | ICD-10-CM | POA: Diagnosis not present

## 2023-05-15 ENCOUNTER — Encounter: Payer: Self-pay | Admitting: *Deleted

## 2023-05-15 DIAGNOSIS — R262 Difficulty in walking, not elsewhere classified: Secondary | ICD-10-CM | POA: Diagnosis not present

## 2023-05-15 DIAGNOSIS — Z955 Presence of coronary angioplasty implant and graft: Secondary | ICD-10-CM

## 2023-05-15 DIAGNOSIS — M6281 Muscle weakness (generalized): Secondary | ICD-10-CM | POA: Diagnosis not present

## 2023-05-15 DIAGNOSIS — I4819 Other persistent atrial fibrillation: Secondary | ICD-10-CM | POA: Diagnosis not present

## 2023-05-15 NOTE — Progress Notes (Signed)
Cardiac Individual Treatment Plan  Patient Details  Name: [name] MRN: [id number] Date of Birth: 1933/01/03 Referring Provider:   Flowsheet Row Cardiac Rehab from 03/06/2023 in [facility] Cardiac and Pulmonary Rehab  Referring Provider Dr. Cammy Copa       Initial Encounter Date:  Flowsheet Row Cardiac Rehab from 03/06/2023 in [facility] Cardiac and Pulmonary Rehab  Date 03/06/23       Visit Diagnosis: Status post coronary artery stent placement  Patient's Home Medications on Admission:  Current Outpatient Medications:    acetaminophen (TYLENOL) 325 MG tablet, Take 650 mg by mouth every 6 (six) hours as needed. Reported on 01/17/2016, Disp: , Rfl:    ALPRAZolam (XANAX) 0.25 MG tablet, TAKE 1 TABLET(0.25 MG) BY MOUTH TWICE DAILY AS NEEDED FOR ANXIETY (Patient not taking: Reported on 02/28/2023), Disp: 60 tablet, Rfl: 0   apixaban (ELIQUIS) 2.5 MG TABS tablet, Take 2.5 mg by mouth 2 (two) times daily., Disp: , Rfl:    atorvastatin (LIPITOR) 80 MG tablet, TAKE ONE TABLET BY MOUTH AT BEDTIME FOR HIGH CHOLESTEROL, Disp: , Rfl:    clopidogrel (PLAVIX) 75 MG tablet, TAKE ONE TABLET BY MOUTH EVERY DAY FOR BLOOD CLOT PREVENTION FOLLOWING PCI BE SURE TO CHECK WITH YOUR DOCTOR ABOUT HOW LONG YOU SHOULD TAKE THIS MEDICINE, Disp: , Rfl:    diltiazem (TIAZAC) 180 MG 24 hr capsule, Take 180 mg by mouth daily. (Patient not taking: Reported on 02/28/2023), Disp: , Rfl:    finasteride (PROSCAR) 5 MG tablet, Take 1 tablet (5 mg total) by mouth daily., Disp: 90 tablet, Rfl: 3   Glucosamine 500 MG CAPS, Take by mouth daily.   (Patient not taking: Reported on 02/28/2023), Disp: , Rfl:    tamsulosin (FLOMAX) 0.4 MG CAPS capsule, TAKE ONE CAPSULE BY MOUTH EVERY DAY, Disp: 30 capsule, Rfl: 11   vitamin B-12 (CYANOCOBALAMIN) 500 MCG tablet, Take 1,000 mcg by mouth daily., Disp: , Rfl:   Past Medical History: Past Medical History:  Diagnosis Date   Acute diverticulitis 12/15   [facility]   Acute diverticulitis     Allergic rhinitis    Atrial fibrillation or flutter 12/10   Has been paroxysmal. Patient has not tolerated coumadin due to headaches. ECHO (12/10) Mild LVH, EF 55%,    BPH (benign prostatic hyperplasia)    Chest pain 12/10   Lexiscan myoview with EF 55%, no ischemia or infarction   Chronic constipation 01/10/2015   Dysrhythmia    AFIB   HOH (hard of hearing)    Hyperlipemia    Hypertension    Osteoarthritis    Pacemaker  medtronic 02/06/2010   Qualifier: Diagnosis of  By: Ladona Ridgel, MD, [facility], [place]    Presence of permanent cardiac pacemaker    Sick sinus syndrome (HCC)    S/P dual chamber Medtronic PCM   Sigmoid volvulus (HCC)     Tobacco Use: Social History   Tobacco Use  Smoking Status Former   Passive exposure: Past  Smokeless Tobacco Never  Tobacco Comments   In [employer], quit in 1960's    Labs: Review Flowsheet  More data exists      Latest Ref Rng & Units 12/01/2009 01/24/2011 04/29/2015 12/03/2016 10/13/2021  Labs for ITP Cardiac and Pulmonary Rehab  Cholestrol 0 - 200 mg/dL 409  811  914  782  -  LDL (calc) 0 - 99 mg/dL 956  - 213  086  -  Direct LDL mg/dL - 578.4  - - -  HDL-C >69.62 mg/dL  56  53.40  53.40  57.40  -  Trlycerides 0.0 - 149.0 mg/dL 62  40.9  81.1  91.4  -  Hemoglobin A1c 4.6 - 6.5 % - - - - 5.5     Details             Exercise Target Goals: Exercise Program Goal: Individual exercise prescription set using results from initial 6 min walk test and THRR while considering  patient's activity barriers and safety.   Exercise Prescription Goal: Initial exercise prescription builds to 30-45 minutes a day of aerobic activity, 2-3 days per week.  Home exercise guidelines will be given to patient during program as part of exercise prescription that the participant will acknowledge.   Education: Aerobic Exercise: - Group verbal and visual presentation on the components of exercise prescription. Introduces F.I.T.T principle from ACSM for exercise  prescriptions.  Reviews F.I.T.T. principles of aerobic exercise including progression. Written material given at graduation. Flowsheet Row Cardiac Rehab from 03/27/2023 in [facility] Cardiac and Pulmonary Rehab  Education need identified 03/06/23  Date 03/13/23  Educator [facility]  Instruction Review Code 1- Verbalizes Understanding       Education: Resistance Exercise: - Group verbal and visual presentation on the components of exercise prescription. Introduces F.I.T.T principle from ACSM for exercise prescriptions  Reviews F.I.T.T. principles of resistance exercise including progression. Written material given at graduation. Flowsheet Row Cardiac Rehab from 03/27/2023 in [facility] Cardiac and Pulmonary Rehab  Date 03/27/23  Educator NT  Instruction Review Code 1- Verbalizes Understanding        Education: Exercise & Equipment Safety: - Individual verbal instruction and demonstration of equipment use and safety with use of the equipment. Flowsheet Row Cardiac Rehab from 03/27/2023 in [facility] Cardiac and Pulmonary Rehab  Date 03/06/23  Educator [facility]  Instruction Review Code 1- Verbalizes Understanding       Education: Exercise Physiology & General Exercise Guidelines: - Group verbal and written instruction with models to review the exercise physiology of the cardiovascular system and associated critical values. Provides general exercise guidelines with specific guidelines to those with heart or lung disease.  Flowsheet Row Cardiac Rehab from 03/27/2023 in [facility] Cardiac and Pulmonary Rehab  Education need identified 03/06/23       Education: Flexibility, Balance, Mind/Body Relaxation: - Group verbal and visual presentation with interactive activity on the components of exercise prescription. Introduces F.I.T.T principle from ACSM for exercise prescriptions. Reviews F.I.T.T. principles of flexibility and balance exercise training including progression. Also discusses the mind body connection.  Reviews  various relaxation techniques to help reduce and manage stress (i.e. Deep breathing, progressive muscle relaxation, and visualization). Balance handout provided to take home. Written material given at graduation. Flowsheet Row Cardiac Rehab from 03/27/2023 in [facility] Cardiac and Pulmonary Rehab  Date 03/27/23  Educator NT  Instruction Review Code 1- Verbalizes Understanding       Activity Barriers & Risk Stratification:  Activity Barriers & Cardiac Risk Stratification - 03/06/23 1336       Activity Barriers & Cardiac Risk Stratification   Activity Barriers Joint Problems;Muscular Weakness    Cardiac Risk Stratification Moderate             6 Minute Walk:  6 Minute Walk     Row Name 03/06/23 1332         6 Minute Walk   Phase Initial     Distance 155 feet     Walk Time 1.42 minutes     # of Rest Breaks 1  MPH 0.29     METS 1.97     RPE 13     Perceived Dyspnea  1     VO2 Peak 6.9     Symptoms Yes (comment)  weak legs-stopped test after 1:25     Comments weak legs-stopped test at 1:25     Resting HR 65 bpm     Resting BP 130/62     Resting Oxygen Saturation  98 %     Exercise Oxygen Saturation  during 6 min walk 99 %     Max Ex. HR 81 bpm     Max Ex. BP 138/70     2 Minute Post BP 128/64              Oxygen Initial Assessment:   Oxygen Re-Evaluation:   Oxygen Discharge (Final Oxygen Re-Evaluation):   Initial Exercise Prescription:  Initial Exercise Prescription - 03/06/23 1300       Date of Initial Exercise RX and Referring Provider   Date 03/06/23    Referring Provider Dr. Cammy Copa      Oxygen   Maintain Oxygen Saturation 88% or higher      Treadmill   MPH 0.5    Grade 0    Minutes 15   eventual goal of 15 min. Start with intervals, resting as often as needed.   METs 1.4      Recumbant Bike   Level 1    RPM 50    Watts 15    Minutes 15      NuStep   Level 1    SPM 80    Minutes 15    METs 1.97      Track   Laps 5     Minutes 15    METs 1.27      Prescription Details   Frequency (times per week) 3    Duration Progress to 30 minutes of continuous aerobic without signs/symptoms of physical distress      Intensity   THRR 40-80% of Max Heartrate 91-118    Ratings of Perceived Exertion 11-13    Perceived Dyspnea 0-4      Progression   Progression Continue to progress workloads to maintain intensity without signs/symptoms of physical distress.      Resistance Training   Training Prescription Yes    Weight 4    Reps 10-15             Perform Capillary Blood Glucose checks as needed.  Exercise Prescription Changes:   Exercise Prescription Changes     Row Name 03/06/23 1300 03/11/23 1500 03/27/23 1400 04/10/23 0800 04/25/23 1000     Response to Exercise   Blood Pressure (Admit) 130/62 126/64 122/66 122/70 104/60   Blood Pressure (Exercise) 138/70 130/60 128/66 118/60 144/72   Blood Pressure (Exit) 128/64 122/60 124/62 114/60 108/58   Heart Rate (Admit) 65 bpm 68 bpm 72 bpm 69 bpm 76 bpm   Heart Rate (Exercise) 81 bpm 67 bpm 74 bpm 96 bpm 92 bpm   Heart Rate (Exit) 65 bpm 57 bpm 65 bpm 71 bpm 62 bpm   Oxygen Saturation (Admit) 98 % -- -- -- --   Oxygen Saturation (Exercise) 99 % -- -- -- --   Oxygen Saturation (Exit) 99 % -- -- -- --   Rating of Perceived Exertion (Exercise) 13 16 15 15 15    Perceived Dyspnea (Exercise) 1 -- -- -- --   Symptoms weak legs- stopped test at 1:25 leg pain  leg pain leg pain leg pain   Comments 6 MWT results First full day of exercise -- -- --   Duration -- Progress to 30 minutes of  aerobic without signs/symptoms of physical distress Progress to 30 minutes of  aerobic without signs/symptoms of physical distress Progress to 30 minutes of  aerobic without signs/symptoms of physical distress Progress to 30 minutes of  aerobic without signs/symptoms of physical distress   Intensity -- THRR unchanged THRR unchanged THRR unchanged THRR unchanged     Progression    Progression -- Continue to progress workloads to maintain intensity without signs/symptoms of physical distress. Continue to progress workloads to maintain intensity without signs/symptoms of physical distress. Continue to progress workloads to maintain intensity without signs/symptoms of physical distress. Continue to progress workloads to maintain intensity without signs/symptoms of physical distress.   Average METs -- 1.8 2.26 1.92 2.25     Resistance Training   Training Prescription -- Yes Yes Yes Yes   Weight -- 4 4 lb 4 lb 3 lb   Reps -- 10-15 10-15 10-15 10-15     Interval Training   Interval Training -- No No No No     NuStep   Level -- 1 1 1 1    Minutes -- 30 30 30 30    METs -- 1.8 3.7 2.1 2.6     T5 Nustep   Level -- -- 1 1 1    Minutes -- -- 30 30 15    METs -- -- 2 2 1.9     Oxygen   Maintain Oxygen Saturation -- 88% or higher 88% or higher 88% or higher 88% or higher            Exercise Comments:   Exercise Comments     Row Name 03/08/23 1026           Exercise Comments First full day of exercise!  Patient was oriented to gym and equipment including functions, settings, policies, and procedures.  Patient's individual exercise prescription and treatment plan were reviewed.  All starting workloads were established based on the results of the 6 minute walk test done at initial orientation visit.  The plan for exercise progression was also introduced and progression will be customized based on patient's performance and goals.                Exercise Goals and Review:   Exercise Goals     Row Name 03/06/23 1357             Exercise Goals   Increase Physical Activity Yes       Intervention Provide advice, education, support and counseling about physical activity/exercise needs.;Develop an individualized exercise prescription for aerobic and resistive training based on initial evaluation findings, risk stratification, comorbidities and participant's  personal goals.       Expected Outcomes Short Term: Attend rehab on a regular basis to increase amount of physical activity.;Long Term: Add in home exercise to make exercise part of routine and to increase amount of physical activity.;Long Term: Exercising regularly at least 3-5 days a week.       Increase Strength and Stamina Yes       Intervention Provide advice, education, support and counseling about physical activity/exercise needs.;Develop an individualized exercise prescription for aerobic and resistive training based on initial evaluation findings, risk stratification, comorbidities and participant's personal goals.       Expected Outcomes Short Term: Increase workloads from initial exercise prescription for resistance, speed, and METs.;Short Term: Perform  resistance training exercises routinely during rehab and add in resistance training at home;Long Term: Improve cardiorespiratory fitness, muscular endurance and strength as measured by increased METs and functional capacity ( )       Able to understand and use rate of perceived exertion (RPE) scale Yes       Intervention Provide education and explanation on how to use RPE scale       Expected Outcomes Short Term: Able to use RPE daily in rehab to express subjective intensity level;Long Term:  Able to use RPE to guide intensity level when exercising independently       Able to understand and use Dyspnea scale Yes       Intervention Provide education and explanation on how to use Dyspnea scale       Expected Outcomes Short Term: Able to use Dyspnea scale daily in rehab to express subjective sense of shortness of breath during exertion;Long Term: Able to use Dyspnea scale to guide intensity level when exercising independently       Knowledge and understanding of Target Heart Rate Range (THRR) Yes       Intervention Provide education and explanation of THRR including how the numbers were predicted and where they are located for reference        Expected Outcomes Short Term: Able to state/look up THRR;Long Term: Able to use THRR to govern intensity when exercising independently;Short Term: Able to use daily as guideline for intensity in rehab       Able to check pulse independently Yes       Intervention Provide education and demonstration on how to check pulse in carotid and radial arteries.;Review the importance of being able to check your own pulse for safety during independent exercise       Expected Outcomes Short Term: Able to explain why pulse checking is important during independent exercise;Long Term: Able to check pulse independently and accurately       Understanding of Exercise Prescription Yes       Intervention Provide education, explanation, and written materials on patient's individual exercise prescription       Expected Outcomes Short Term: Able to explain program exercise prescription;Long Term: Able to explain home exercise prescription to exercise independently                Exercise Goals Re-Evaluation :  Exercise Goals Re-Evaluation     Row Name 03/08/23 1026 03/11/23 1507 03/27/23 1437 04/10/23 0819 04/25/23 1010     Exercise Goal Re-Evaluation   Exercise Goals Review Able to understand and use rate of perceived exertion (RPE) scale;Able to understand and use Dyspnea scale;Knowledge and understanding of Target Heart Rate Range (THRR);Understanding of Exercise Prescription Increase Physical Activity;Increase Strength and Stamina;Understanding of Exercise Prescription Increase Physical Activity;Increase Strength and Stamina;Understanding of Exercise Prescription Increase Physical Activity;Increase Strength and Stamina;Understanding of Exercise Prescription Increase Physical Activity;Increase Strength and Stamina;Understanding of Exercise Prescription   Comments Reviewed RPE  and dyspnea scale, THR and program prescription with pt today.  Pt voiced understanding and was given a copy of goals to take home. [name] is  off to a good start in the program. He did well on the T4 nustep at level 1 during his first session. He has not done any walking yet due to having pain and weakness in his legs. He did do well with 4 lb hand weights for resistance training as well. We will continue to monitor his progress in the program. [name] is doing well in rehab.  He has been doing 30 minutes at level 1 on either the T4 nustep or the T5 nustep while in the program. He also has continued to do well with 4 lb hand weights for resistance training. He has not been doing any walking in rehab. We will continue to monitor his progress in the program. [name] is doing well in rehab. He has been doing 30 minutes on both the T4 nustep and T5 nustep. He has not started doing any walking in the program due to leg pain. We will continue to monitor his progress in the program. [name] has only attended two sessions since the last review. He continues to work at level 1 on the T4 and T5 nustep. He has not started walking as part of his exercise in the program due to leg pain. We will continue to monitor (his/her) progress in the program.   Expected Outcomes Short: Use RPE daily to regulate intensity. Long: Follow program prescription in THR. Short: Continue to follow current exercise prescription. Long: Continue to improve strength and stamina. Short: Try level 2 on T4 nustep. Long: Continue to improve strength and stamina. Short: Try level 2 on T4 nustep. Long: Continue to improve strength and stamina. Short: Try level 2 on T4 nustep. Long: Continue exercise to improve strength and stamina.            Discharge Exercise Prescription (Final Exercise Prescription Changes):  Exercise Prescription Changes - 04/25/23 1000       Response to Exercise   Blood Pressure (Admit) 104/60    Blood Pressure (Exercise) 144/72    Blood Pressure (Exit) 108/58    Heart Rate (Admit) 76 bpm    Heart Rate (Exercise) 92 bpm    Heart Rate (Exit) 62 bpm    Rating of  Perceived Exertion (Exercise) 15    Symptoms leg pain    Duration Progress to 30 minutes of  aerobic without signs/symptoms of physical distress    Intensity THRR unchanged      Progression   Progression Continue to progress workloads to maintain intensity without signs/symptoms of physical distress.    Average METs 2.25      Resistance Training   Training Prescription Yes    Weight 3 lb    Reps 10-15      Interval Training   Interval Training No      NuStep   Level 1    Minutes 30    METs 2.6      T5 Nustep   Level 1    Minutes 15    METs 1.9      Oxygen   Maintain Oxygen Saturation 88% or higher             Nutrition:  Target Goals: Understanding of nutrition guidelines, daily intake of sodium 1500mg , cholesterol 200mg , calories 30% from fat and 7% or less from saturated fats, daily to have 5 or more servings of fruits and vegetables.  Education: All About Nutrition: -Group instruction provided by verbal, written material, interactive activities, discussions, models, and posters to present general guidelines for heart healthy nutrition including fat, fiber, MyPlate, the role of sodium in heart healthy nutrition, utilization of the nutrition label, and utilization of this knowledge for meal planning. Follow up email sent as well. Written material given at graduation.   Biometrics:  Pre Biometrics - 03/06/23 1358       Pre Biometrics   Height 5' 9.5" (1.765 m)    Weight 146 lb 3.2 oz (  66.3 kg)    Waist Circumference 34 inches    Hip Circumference 37.5 inches    Waist to Hip Ratio 0.91 %    BMI (Calculated) 21.29    Single Leg Stand 1.97 seconds              Nutrition Therapy Plan and Nutrition Goals:  Nutrition Therapy & Goals - 03/06/23 1400       Intervention Plan   Intervention Prescribe, educate and counsel regarding individualized specific dietary modifications aiming towards targeted core components such as weight, hypertension, lipid  management, diabetes, heart failure and other comorbidities.    Expected Outcomes Short Term Goal: Understand basic principles of dietary content, such as calories, fat, sodium, cholesterol and nutrients.;Short Term Goal: A plan has been developed with personal nutrition goals set during dietitian appointment.;Long Term Goal: Adherence to prescribed nutrition plan.             Nutrition Assessments:  MEDIFICTS Score Key: ?70 Need to make dietary changes  40-70 Heart Healthy Diet ? 40 Therapeutic Level Cholesterol Diet  Flowsheet Row Cardiac Rehab from 03/06/2023 in [facility] Cardiac and Pulmonary Rehab  Picture Your Plate Total Score on Admission 39      Picture Your Plate Scores: <16 Unhealthy dietary pattern with much room for improvement. 41-50 Dietary pattern unlikely to meet recommendations for good health and room for improvement. 51-60 More healthful dietary pattern, with some room for improvement.  >60 Healthy dietary pattern, although there may be some specific behaviors that could be improved.    Nutrition Goals Re-Evaluation:   Nutrition Goals Discharge (Final Nutrition Goals Re-Evaluation):   Psychosocial: Target Goals: Acknowledge presence or absence of significant depression and/or stress, maximize coping skills, provide positive support system. Participant is able to verbalize types and ability to use techniques and skills needed for reducing stress and depression.   Education: Stress, Anxiety, and Depression - Group verbal and visual presentation to define topics covered.  Reviews how body is impacted by stress, anxiety, and depression.  Also discusses healthy ways to reduce stress and to treat/manage anxiety and depression.  Written material given at graduation.   Education: Sleep Hygiene -Provides group verbal and written instruction about how sleep can affect your health.  Define sleep hygiene, discuss sleep cycles and impact of sleep habits. Review good sleep  hygiene tips.    Initial Review & Psychosocial Screening:  Initial Psych Review & Screening - 02/28/23 1443       Initial Review   Current issues with Current Stress Concerns    Comments not feeling better after stents      Family Dynamics   Good Support System? Yes   wife     Barriers   Psychosocial barriers to participate in program There are no identifiable barriers or psychosocial needs.;The patient should benefit from training in stress management and relaxation.      Screening Interventions   Interventions Encouraged to exercise;Provide feedback about the scores to participant;To provide support and resources with identified psychosocial needs    Expected Outcomes Long Term Goal: Stressors or current issues are controlled or eliminated.;Short Term goal: Utilizing psychosocial counselor, staff and physician to assist with identification of specific Stressors or current issues interfering with healing process. Setting desired goal for each stressor or current issue identified.;Short Term goal: Identification and review with participant of any Quality of Life or Depression concerns found by scoring the questionnaire.;Long Term goal: The participant improves quality of Life and PHQ9 Scores as seen by  post scores and/or verbalization of changes             Quality of Life Scores:   Quality of Life - 03/06/23 1358       Quality of Life   Select Quality of Life      Quality of Life Scores   Health/Function Pre 15.2 %    Socioeconomic Pre 28.93 %    Psych/Spiritual Pre 21.21 %    Family Pre 30 %    GLOBAL Pre 21.44 %            Scores of 19 and below usually indicate a poorer quality of life in these areas.  A difference of  2-3 points is a clinically meaningful difference.  A difference of 2-3 points in the total score of the Quality of Life Index has been associated with significant improvement in overall quality of life, self-image, physical symptoms, and general health  in studies assessing change in quality of life.  PHQ-9: Review Flowsheet  More data exists      03/06/2023 10/13/2021 08/26/2019 11/28/2016 04/29/2015  Depression screen PHQ 2/9  Decreased Interest 1 0 0 0 0  Down, Depressed, Hopeless 0 0 0 0 0  PHQ - 2 Score 1 0 0 0 0  Altered sleeping 0 - - - -  Tired, decreased energy 3 - - - -  Change in appetite 0 - - - -  Feeling bad or failure about yourself  1 - - - -  Trouble concentrating 0 - - - -  Moving slowly or fidgety/restless 0 - - - -  Suicidal thoughts 0 - - - -  PHQ-9 Score 5 - - - -  Difficult doing work/chores Somewhat difficult - - - -    Details           Interpretation of Total Score  Total Score Depression Severity:  1-4 = Minimal depression, 5-9 = Mild depression, 10-14 = Moderate depression, 15-19 = Moderately severe depression, 20-27 = Severe depression   Psychosocial Evaluation and Intervention:  Psychosocial Evaluation - 02/28/23 1446       Psychosocial Evaluation & Interventions   Interventions Encouraged to exercise with the program and follow exercise prescription;Stress management education    Comments [name] is coming to the program after stent placement. He states that he unfortunately is not feeling as well as he thought he would be after the stent. He still feels tired. He is trying to be patient with his health, but it is stressful that he still doesn't have energy. He states he is eating and sleeping well.  He is hopeful this program will help boost his stamina. His wife is his main support system.    Expected Outcomes Short: attend cardiac rehab for education and exercise. Long: develop and maintain positive self care habits.    Continue Psychosocial Services  Follow up required by staff             Psychosocial Re-Evaluation:   Psychosocial Discharge (Final Psychosocial Re-Evaluation):   Vocational Rehabilitation: Provide vocational rehab assistance to qualifying candidates.    Vocational Rehab Evaluation & Intervention:  Vocational Rehab - 02/28/23 1446       Initial Vocational Rehab Evaluation & Intervention   Assessment shows need for Vocational Rehabilitation No             Education: Education Goals: Education classes will be provided on a variety of topics geared toward better understanding of heart health and risk factor  modification. Participant will state understanding/return demonstration of topics presented as noted by education test scores.  Learning Barriers/Preferences:  Learning Barriers/Preferences - 02/28/23 1446       Learning Barriers/Preferences   Learning Barriers Hearing    Learning Preferences Individual Instruction             General Cardiac Education Topics:  AED/CPR: - Group verbal and written instruction with the use of models to demonstrate the basic use of the AED with the basic ABC's of resuscitation.   Anatomy and Cardiac Procedures: - Group verbal and visual presentation and models provide information about basic cardiac anatomy and function. Reviews the testing methods done to diagnose heart disease and the outcomes of the test results. Describes the treatment choices: Medical Management, Angioplasty, or Coronary Bypass Surgery for treating various heart conditions including Myocardial Infarction, Angina, Valve Disease, and Cardiac Arrhythmias.  Written material given at graduation. Flowsheet Row Cardiac Rehab from 03/27/2023 in [facility] Cardiac and Pulmonary Rehab  Education need identified 03/06/23       Medication Safety: - Group verbal and visual instruction to review commonly prescribed medications for heart and lung disease. Reviews the medication, class of the drug, and side effects. Includes the steps to properly store meds and maintain the prescription regimen.  Written material given at graduation.   Intimacy: - Group verbal instruction through game format to discuss how heart and lung disease can  affect sexual intimacy. Written material given at graduation.. Flowsheet Row Cardiac Rehab from 03/27/2023 in [facility] Cardiac and Pulmonary Rehab  Date 03/13/23  Educator [facility]  Instruction Review Code 1- Verbalizes Understanding       Know Your Numbers and Heart Failure: - Group verbal and visual instruction to discuss disease risk factors for cardiac and pulmonary disease and treatment options.  Reviews associated critical values for Overweight/Obesity, Hypertension, Cholesterol, and Diabetes.  Discusses basics of heart failure: signs/symptoms and treatments.  Introduces Heart Failure Zone chart for action plan for heart failure.  Written material given at graduation.   Infection Prevention: - Provides verbal and written material to individual with discussion of infection control including proper hand washing and proper equipment cleaning during exercise session. Flowsheet Row Cardiac Rehab from 03/27/2023 in [facility] Cardiac and Pulmonary Rehab  Date 03/06/23  Educator [facility]  Instruction Review Code 1- Verbalizes Understanding       Falls Prevention: - Provides verbal and written material to individual with discussion of falls prevention and safety. Flowsheet Row Cardiac Rehab from 03/27/2023 in [facility] Cardiac and Pulmonary Rehab  Date 03/06/23  Educator [facility]  Instruction Review Code 1- Verbalizes Understanding       Other: -Provides group and verbal instruction on various topics (see comments)   Knowledge Questionnaire Score:  Knowledge Questionnaire Score - 03/06/23 1358       Knowledge Questionnaire Score   Pre Score 22/26             Core Components/Risk Factors/Patient Goals at Admission:  Personal Goals and Risk Factors at Admission - 03/06/23 1400       Core Components/Risk Factors/Patient Goals on Admission    Weight Management Yes    Intervention Weight Management: Develop a combined nutrition and exercise program designed to reach desired caloric intake, while  maintaining appropriate intake of nutrient and fiber, sodium and fats, and appropriate energy expenditure required for the weight goal.;Weight Management: Provide education and appropriate resources to help participant work on and attain dietary goals.    Admit Weight 146 lb 3.2  oz (66.3 kg)    Goal Weight: Short Term 150 lb (68 kg)    Goal Weight: Long Term 155 lb (70.3 kg)    Expected Outcomes Short Term: Continue to assess and modify interventions until short term weight is achieved;Long Term: Adherence to nutrition and physical activity/exercise program aimed toward attainment of established weight goal;Weight Gain: Understanding of general recommendations for a high calorie, high protein meal plan that promotes weight gain by distributing calorie intake throughout the day with the consumption for 4-5 meals, snacks, and/or supplements;Understanding of distribution of calorie intake throughout the day with the consumption of 4-5 meals/snacks;Understanding recommendations for meals to include 15-35% energy as protein, 25-35% energy from fat, 35-60% energy from carbohydrates, less than 200mg  of dietary cholesterol, 20-35 gm of total fiber daily    Hypertension Yes    Intervention Provide education on lifestyle modifcations including regular physical activity/exercise, weight management, moderate sodium restriction and increased consumption of fresh fruit, vegetables, and low fat dairy, alcohol moderation, and smoking cessation.;Monitor prescription use compliance.    Expected Outcomes Short Term: Continued assessment and intervention until BP is < 140/59mm HG in hypertensive participants. < 130/77mm HG in hypertensive participants with diabetes, heart failure or chronic kidney disease.;Long Term: Maintenance of blood pressure at goal levels.    Lipids Yes    Intervention Provide education and support for participant on nutrition & aerobic/resistive exercise along with prescribed medications to achieve LDL  70mg , HDL >40mg .    Expected Outcomes Short Term: Participant states understanding of desired cholesterol values and is compliant with medications prescribed. Participant is following exercise prescription and nutrition guidelines.;Long Term: Cholesterol controlled with medications as prescribed, with individualized exercise RX and with personalized nutrition plan. Value goals: LDL < 70mg , HDL > 40 mg.             Education:Diabetes - Individual verbal and written instruction to review signs/symptoms of diabetes, desired ranges of glucose level fasting, after meals and with exercise. Acknowledge that pre and post exercise glucose checks will be done for 3 sessions at entry of program.   Core Components/Risk Factors/Patient Goals Review:    Core Components/Risk Factors/Patient Goals at Discharge (Final Review):    ITP Comments:  ITP Comments     Row Name 02/28/23 1433 03/06/23 1331 03/08/23 1026 03/20/23 0836 04/16/23 1413   ITP Comments Initial phone call completed. Diagnosis can be found [facility] in media tab. EP Orientation scheduled for Wednesday 5/29 10:30. Completed and gym orientation. Initial ITP created and sent for review to Dr. Bethann Punches, Medical Director. First full day of exercise!  Patient was oriented to gym and equipment including functions, settings, policies, and procedures.  Patient's individual exercise prescription and treatment plan were reviewed.  All starting workloads were established based on the results of the 6 minute walk test done at initial orientation visit.  The plan for exercise progression was also introduced and progression will be customized based on patient's performance and goals. 30 Day review completed. Medical Director ITP review done, changes made as directed, and signed approval by Medical Director.   new to program 30 Day review completed. Medical Director ITP review done, changes made as directed, and signed approval by Medical  Director.    last visit 6/24    Row Name 05/06/23 1522 05/08/23 0946 05/15/23 1131       ITP Comments Called and spoke with [name], he was not at rehab today. He has continued decline in strength and states  he can hardly walk. [facility] [name] says he is unable to complete rehab program at this time until they can figure out why he is losing his strength. Will discharge from the program at this time, and [name] understands he may restart in the future with a new referral if able. [name]'s daughter [name] called today with [name] and requested to be placed on a medical hold. Pt will be attending a long term rehab facility for 30 days but would like to return to our program upon completion of his time there. We infromed Pt and Pt's daughter that he will need to keep [place] updated on when he plans to return to cardiac rehab. We will place Pt on medical hold at this time. 30 Day review completed. Medical Director ITP review done, changes made as directed, and signed approval by Medical Director.   medical hold              Comments:

## 2023-05-17 DIAGNOSIS — M6281 Muscle weakness (generalized): Secondary | ICD-10-CM | POA: Diagnosis not present

## 2023-05-17 DIAGNOSIS — R262 Difficulty in walking, not elsewhere classified: Secondary | ICD-10-CM | POA: Diagnosis not present

## 2023-05-17 DIAGNOSIS — I4819 Other persistent atrial fibrillation: Secondary | ICD-10-CM | POA: Diagnosis not present

## 2023-05-21 DIAGNOSIS — M6281 Muscle weakness (generalized): Secondary | ICD-10-CM | POA: Diagnosis not present

## 2023-05-21 DIAGNOSIS — I4819 Other persistent atrial fibrillation: Secondary | ICD-10-CM | POA: Diagnosis not present

## 2023-05-21 DIAGNOSIS — R262 Difficulty in walking, not elsewhere classified: Secondary | ICD-10-CM | POA: Diagnosis not present

## 2023-05-22 DIAGNOSIS — R262 Difficulty in walking, not elsewhere classified: Secondary | ICD-10-CM | POA: Diagnosis not present

## 2023-05-22 DIAGNOSIS — I4819 Other persistent atrial fibrillation: Secondary | ICD-10-CM | POA: Diagnosis not present

## 2023-05-22 DIAGNOSIS — M6281 Muscle weakness (generalized): Secondary | ICD-10-CM | POA: Diagnosis not present

## 2023-05-23 DIAGNOSIS — R5381 Other malaise: Secondary | ICD-10-CM | POA: Diagnosis not present

## 2023-05-23 DIAGNOSIS — K5900 Constipation, unspecified: Secondary | ICD-10-CM | POA: Diagnosis not present

## 2023-05-23 DIAGNOSIS — I2510 Atherosclerotic heart disease of native coronary artery without angina pectoris: Secondary | ICD-10-CM | POA: Diagnosis not present

## 2023-05-23 DIAGNOSIS — I4891 Unspecified atrial fibrillation: Secondary | ICD-10-CM | POA: Diagnosis not present

## 2023-05-23 DIAGNOSIS — R600 Localized edema: Secondary | ICD-10-CM | POA: Diagnosis not present

## 2023-05-27 DIAGNOSIS — I4819 Other persistent atrial fibrillation: Secondary | ICD-10-CM | POA: Diagnosis not present

## 2023-05-27 DIAGNOSIS — M6281 Muscle weakness (generalized): Secondary | ICD-10-CM | POA: Diagnosis not present

## 2023-05-27 DIAGNOSIS — E038 Other specified hypothyroidism: Secondary | ICD-10-CM | POA: Diagnosis not present

## 2023-05-27 DIAGNOSIS — Z79899 Other long term (current) drug therapy: Secondary | ICD-10-CM | POA: Diagnosis not present

## 2023-05-27 DIAGNOSIS — D518 Other vitamin B12 deficiency anemias: Secondary | ICD-10-CM | POA: Diagnosis not present

## 2023-05-27 DIAGNOSIS — E782 Mixed hyperlipidemia: Secondary | ICD-10-CM | POA: Diagnosis not present

## 2023-05-27 DIAGNOSIS — E559 Vitamin D deficiency, unspecified: Secondary | ICD-10-CM | POA: Diagnosis not present

## 2023-05-27 DIAGNOSIS — E119 Type 2 diabetes mellitus without complications: Secondary | ICD-10-CM | POA: Diagnosis not present

## 2023-05-27 DIAGNOSIS — R262 Difficulty in walking, not elsewhere classified: Secondary | ICD-10-CM | POA: Diagnosis not present

## 2023-05-28 ENCOUNTER — Other Ambulatory Visit: Payer: Self-pay

## 2023-05-28 ENCOUNTER — Emergency Department: Payer: No Typology Code available for payment source

## 2023-05-28 DIAGNOSIS — Y9301 Activity, walking, marching and hiking: Secondary | ICD-10-CM | POA: Diagnosis not present

## 2023-05-28 DIAGNOSIS — M25561 Pain in right knee: Secondary | ICD-10-CM | POA: Diagnosis not present

## 2023-05-28 DIAGNOSIS — Z950 Presence of cardiac pacemaker: Secondary | ICD-10-CM | POA: Insufficient documentation

## 2023-05-28 DIAGNOSIS — Z7901 Long term (current) use of anticoagulants: Secondary | ICD-10-CM | POA: Insufficient documentation

## 2023-05-28 DIAGNOSIS — W19XXXA Unspecified fall, initial encounter: Secondary | ICD-10-CM | POA: Insufficient documentation

## 2023-05-28 DIAGNOSIS — M25562 Pain in left knee: Secondary | ICD-10-CM | POA: Insufficient documentation

## 2023-05-28 DIAGNOSIS — M6281 Muscle weakness (generalized): Secondary | ICD-10-CM | POA: Diagnosis not present

## 2023-05-28 DIAGNOSIS — S41111A Laceration without foreign body of right upper arm, initial encounter: Secondary | ICD-10-CM | POA: Diagnosis not present

## 2023-05-28 DIAGNOSIS — R262 Difficulty in walking, not elsewhere classified: Secondary | ICD-10-CM | POA: Diagnosis not present

## 2023-05-28 DIAGNOSIS — I10 Essential (primary) hypertension: Secondary | ICD-10-CM | POA: Diagnosis not present

## 2023-05-28 DIAGNOSIS — S4991XA Unspecified injury of right shoulder and upper arm, initial encounter: Secondary | ICD-10-CM | POA: Diagnosis present

## 2023-05-28 DIAGNOSIS — I4819 Other persistent atrial fibrillation: Secondary | ICD-10-CM | POA: Diagnosis not present

## 2023-05-28 LAB — BASIC METABOLIC PANEL
Anion gap: 8 (ref 5–15)
BUN: 20 mg/dL (ref 8–23)
CO2: 23 mmol/L (ref 22–32)
Calcium: 8.5 mg/dL — ABNORMAL LOW (ref 8.9–10.3)
Chloride: 103 mmol/L (ref 98–111)
Creatinine, Ser: 0.61 mg/dL (ref 0.61–1.24)
GFR, Estimated: >60 mL/min (ref >60)
Glucose, Bld: 91 mg/dL (ref 70–99)
Potassium: 3.7 mmol/L (ref 3.5–5.1)
Sodium: 134 mmol/L — ABNORMAL LOW (ref 135–145)

## 2023-05-28 LAB — CBC
HCT: 34.7 % — ABNORMAL LOW (ref 39.0–52.0)
Hemoglobin: 11.4 g/dL — ABNORMAL LOW (ref 13.0–17.0)
MCH: 30.9 pg (ref 26.0–34.0)
MCHC: 32.9 g/dL (ref 30.0–36.0)
MCV: 94 fL (ref 80.0–100.0)
Platelets: 186 10*3/uL (ref 150–400)
RBC: 3.69 MIL/uL — ABNORMAL LOW (ref 4.22–5.81)
RDW: 14.9 % (ref 11.5–15.5)
WBC: 5.1 10*3/uL (ref 4.0–10.5)
nRBC: 0 % (ref 0.0–0.2)

## 2023-05-28 LAB — TROPONIN I (HIGH SENSITIVITY): Troponin I (High Sensitivity): 52 ng/L — ABNORMAL HIGH (ref <18)

## 2023-05-28 NOTE — ED Notes (Signed)
Pt denied being able to produce a urine sample at this time. Pt provided with a labeled specimen cup and instructions to return cup to triage nurse desk once it has a clean catch urine sample.

## 2023-05-28 NOTE — ED Triage Notes (Signed)
Pt presents to ER via ems from [facility] following a fall that happened around 1hr ago.  Ems reports pt was walking with his walker with staff at facility, and pt started to fall, and staff at facility "assisted pt to the ground."  Pt has skin tears noted to right bicep, and forearm.  Skin tears wrapped in gauze on arrival.  Pt also reports pain in BIL knees.  Pt states he takes eliquis and plavix normally.  Denies hitting head or LOC.  Pt is otherwise A&O x4 and in NAD.

## 2023-05-28 NOTE — ED Notes (Signed)
Patient transported to X-ray 

## 2023-05-29 ENCOUNTER — Emergency Department
Admission: EM | Admit: 2023-05-29 | Discharge: 2023-05-29 | Disposition: A | Discharge status: Home / Self Care | Payer: No Typology Code available for payment source | Attending: Emergency Medicine | Admitting: Emergency Medicine

## 2023-05-29 ENCOUNTER — Telehealth: Payer: Self-pay | Admitting: Internal Medicine

## 2023-05-29 DIAGNOSIS — S41111A Laceration without foreign body of right upper arm, initial encounter: Secondary | ICD-10-CM

## 2023-05-29 DIAGNOSIS — M25561 Pain in right knee: Secondary | ICD-10-CM

## 2023-05-29 DIAGNOSIS — W19XXXA Unspecified fall, initial encounter: Secondary | ICD-10-CM

## 2023-05-29 DIAGNOSIS — R531 Weakness: Secondary | ICD-10-CM | POA: Diagnosis not present

## 2023-05-29 DIAGNOSIS — Z743 Need for continuous supervision: Secondary | ICD-10-CM | POA: Diagnosis not present

## 2023-05-29 LAB — URINALYSIS, ROUTINE W REFLEX MICROSCOPIC
Bilirubin Urine: NEGATIVE
Glucose, UA: NEGATIVE mg/dL
Hgb urine dipstick: NEGATIVE
Ketones, ur: NEGATIVE mg/dL
Leukocytes,Ua: NEGATIVE
Nitrite: NEGATIVE
Protein, ur: NEGATIVE mg/dL
Specific Gravity, Urine: 1.019 (ref 1.005–1.030)
pH: 5 (ref 5.0–8.0)

## 2023-05-29 LAB — TROPONIN I (HIGH SENSITIVITY): Troponin I (High Sensitivity): 55 ng/L — ABNORMAL HIGH (ref <18)

## 2023-05-29 MED ORDER — HYDROCODONE-ACETAMINOPHEN 5-325 MG PO TABS
1.0000 | ORAL_TABLET | Freq: Once | ORAL | Status: AC
  Administered 2023-05-29: 1 via ORAL
  Filled 2023-05-29: qty 1

## 2023-05-29 MED ORDER — BACITRACIN ZINC 500 UNIT/GM EX OINT
TOPICAL_OINTMENT | Freq: Once | CUTANEOUS | Status: AC
  Administered 2023-05-29: 1 via TOPICAL
  Filled 2023-05-29: qty 0.9

## 2023-05-29 NOTE — Discharge Instructions (Addendum)
Your lab work, urine showed no acute abnormality today.  X-rays of your knees showed severe arthritis but no fracture or dislocation.  You may apply ice and keep them elevated when at rest to help with pain.  I recommend keeping the wounds to your right upper extremity clean and dry and covered with a nonadherent dressing.  You may take Tylenol 1000 mg every 6 hours as needed for pain.

## 2023-05-29 NOTE — ED Notes (Signed)
called to [facility] for transport to facility/[facility]/rep:blanca.[name]

## 2023-05-29 NOTE — ED Notes (Signed)
Patient discharged at this time. Breathing unlabored speaking in full sentences. Verbalized understanding of all discharge, follow up teaching. Discharged homed with all belongings with EMS transport.

## 2023-05-29 NOTE — Telephone Encounter (Signed)
Tried to reach out to patient to schedule annual physical- unable to leave voicemail

## 2023-05-29 NOTE — ED Provider Notes (Signed)
[facility] Provider Note    Event Date/Time   First MD Initiated Contact with Patient 05/29/23 0140     (approximate)   History   Fall   HPI  [name] is a 89 y.o. male with history of A-fib, hypertension, hyperlipidemia, sick sinus syndrome status post pacemaker who presents to the emergency department from his nursing facility after he had a fall.  Fall was witnessed.  He was using his walker and walking next to staff when his legs gave out.  He states that is not abnormal for him.  They assisted him to the ground onto both knees.  He did not hit his head or lose consciousness.  Has 2 skin tears to the right upper arm he states from catching the arm on the walker.  History provided by patient.    Past Medical History:  Diagnosis Date   Acute diverticulitis 12/15   [facility]   Acute diverticulitis    Allergic rhinitis    Atrial fibrillation or flutter 12/10   Has been paroxysmal. Patient has not tolerated coumadin due to headaches. ECHO (12/10) Mild LVH, EF 55%,    BPH (benign prostatic hyperplasia)    Chest pain 12/10   Lexiscan myoview with EF 55%, no ischemia or infarction   Chronic constipation 01/10/2015   Dysrhythmia    AFIB   HOH (hard of hearing)    Hyperlipemia    Hypertension    Osteoarthritis    Pacemaker  medtronic 02/06/2010   Qualifier: Diagnosis of  By: Ladona Ridgel, MD, Jerrell Mylar    Presence of permanent cardiac pacemaker    Sick sinus syndrome (HCC)    S/P dual chamber Medtronic PCM   Sigmoid volvulus [facility])     Past Surgical History:  Procedure Laterality Date   CATARACT EXTRACTION W/PHACO Left 01/16/2017   Procedure: CATARACT EXTRACTION PHACO AND INTRAOCULAR LENS PLACEMENT (IOC);  Surgeon: Sallee Lange, MD;  Location: [facility] ORS;  Service: Ophthalmology;  Laterality: Left;  [place] 1:51.3 AP% 25.9 CDE 54.80 FLUID PACK LOT # [id number] H   COLONOSCOPY WITH PROPOFOL N/A 09/15/2018   Procedure: COLONOSCOPY WITH PROPOFOL;   Surgeon: Toney Reil, MD;  Location: [facility] ENDOSCOPY;  Service: Gastroenterology;  Laterality: N/A;   dual chamber pacemaker  01/11   Dr. Orpah Melter SIGMOIDOSCOPY N/A 10/07/2018   Procedure: FLEXIBLE SIGMOIDOSCOPY;  Surgeon: Pasty Spillers, MD;  Location: [facility] ENDOSCOPY;  Service: Endoscopy;  Laterality: N/A;   LAPAROSCOPIC CHOLECYSTECTOMY  1/14   Dr Lemar Livings   PARTIAL COLECTOMY N/A 10/13/2018   Procedure: LAPAROTOMY, LEFT COLECTOMY,;  Surgeon: Ancil Linsey, MD;  Location: [facility] ORS;  Service: General;  Laterality: N/A;   VASECTOMY  in his 41's    MEDICATIONS:  Prior to Admission medications   Medication Sig Start Date End Date Taking? Authorizing Provider  acetaminophen (TYLENOL) 325 MG tablet Take 650 mg by mouth every 6 (six) hours as needed. Reported on 01/17/2016    [provider]  ALPRAZolam (XANAX) 0.25 MG tablet TAKE 1 TABLET(0.25 MG) BY MOUTH TWICE DAILY AS NEEDED FOR ANXIETY Patient not taking: Reported on 02/28/2023 09/28/21   Karie Schwalbe, MD  apixaban (ELIQUIS) 2.5 MG TABS tablet Take 2.5 mg by mouth 2 (two) times daily.    [provider]  atorvastatin (LIPITOR) 80 MG tablet TAKE ONE TABLET BY MOUTH AT BEDTIME FOR HIGH CHOLESTEROL 02/05/23   [provider]  clopidogrel (PLAVIX) 75 MG tablet TAKE ONE TABLET BY MOUTH  EVERY DAY FOR BLOOD CLOT PREVENTION FOLLOWING PCI BE SURE TO CHECK WITH YOUR DOCTOR ABOUT HOW LONG YOU SHOULD TAKE THIS MEDICINE 02/01/23   [provider]  diltiazem (TIAZAC) 180 MG 24 hr capsule Take 180 mg by mouth daily. Patient not taking: Reported on 02/28/2023    [provider]  finasteride (PROSCAR) 5 MG tablet Take 1 tablet (5 mg total) by mouth daily. 09/13/17   Karie Schwalbe, MD  Glucosamine 500 MG CAPS Take by mouth daily.   Patient not taking: Reported on 02/28/2023    [provider]  tamsulosin (FLOMAX) 0.4 MG CAPS capsule TAKE ONE CAPSULE BY MOUTH EVERY DAY 08/16/14    Karie Schwalbe, MD  vitamin B-12 (CYANOCOBALAMIN) 500 MCG tablet Take 1,000 mcg by mouth daily.    [provider]    Physical Exam   Triage Vital Signs: ED Triage Vitals  Encounter Vitals Group     BP 05/28/23 2219 123/78     Systolic BP Percentile --      Diastolic BP Percentile --      Pulse Rate 05/28/23 2219 88     Resp 05/28/23 2219 16     Temp 05/28/23 2219 97.6 F (36.4 C)     Temp Source 05/28/23 2219 Oral     SpO2 05/28/23 2219 99 %     Weight 05/28/23 2224 138 lb (62.6 kg)     Height 05/28/23 2224 5\' 10"  (1.778 m)     Head Circumference --      Peak Flow --      Pain Score 05/28/23 2224 8     Pain Loc --      Pain Education --      Exclude from Growth Chart --     Most recent vital signs: Vitals:   05/28/23 2219 05/29/23 0329  BP: 123/78 131/81  Pulse: 88 79  Resp: 16 18  Temp: 97.6 F (36.4 C) 98 F (36.7 C)  SpO2: 99% 100%     CONSTITUTIONAL: Alert, responds appropriately to questions.  Elderly, chronically ill-appearing HEAD: Normocephalic; atraumatic EYES: Conjunctivae clear, PERRL, EOMI ENT: normal nose; no rhinorrhea; moist mucous membranes; pharynx without lesions noted; no dental injury; no septal hematoma, no epistaxis; no facial deformity or bony tenderness NECK: Supple, no midline spinal tenderness, step-off or deformity; trachea midline CARD: RRR; S1 and S2 appreciated; no murmurs, no clicks, no rubs, no gallops RESP: Normal chest excursion without splinting or tachypnea; breath sounds clear and equal bilaterally; no wheezes, no rhonchi, no rales; no hypoxia or respiratory distress CHEST:  chest wall stable, no crepitus or ecchymosis or deformity, nontender to palpation; no flail chest ABD/GI: Non-distended; soft, non-tender, no rebound, no guarding; no ecchymosis or other lesions noted PELVIS:  stable, nontender to palpation BACK:  The back appears normal; no midline spinal tenderness, step-off or deformity EXT: Abrasions to  bilateral knees with associated tenderness but no soft tissue swelling, joint effusion, ecchymosis.  No obvious bony deformity noted.  Extremities warm well-perfused.  Compartments soft.  2 small skin tears noted to the right upper arm. SKIN: Normal color for age and race; warm NEURO: No facial asymmetry, normal speech, moving all extremities equally  ED Results / Procedures / Treatments   LABS: (all labs ordered are listed, but only abnormal results are displayed) Labs Reviewed  CBC - Abnormal; Notable for the following components:      Result Value   RBC 3.69 (*)    Hemoglobin 11.4 (*)  HCT 34.7 (*)    All other components within normal limits  BASIC METABOLIC PANEL - Abnormal; Notable for the following components:   Sodium 134 (*)    Calcium 8.5 (*)    All other components within normal limits  URINALYSIS, ROUTINE W REFLEX MICROSCOPIC - Abnormal; Notable for the following components:   Color, Urine YELLOW (*)    APPearance CLEAR (*)    All other components within normal limits  TROPONIN I (HIGH SENSITIVITY) - Abnormal; Notable for the following components:   Troponin I (High Sensitivity) 52 (*)    All other components within normal limits  TROPONIN I (HIGH SENSITIVITY) - Abnormal; Notable for the following components:   Troponin I (High Sensitivity) 55 (*)    All other components within normal limits     EKG:  EKG Interpretation Date/Time:  Tuesday May 28 2023 22:34:49 EDT Ventricular Rate:  60 PR Interval:  270 QRS Duration:  140 QT Interval:  438 QTC Calculation: 438 R Axis:   264  Text Interpretation: Atrial-paced rhythm with prolonged AV conduction Right superior axis deviation Non-specific intra-ventricular conduction block Minimal voltage criteria for LVH, may be normal variant ( Cornell product ) Cannot rule out Septal infarct , age undetermined Abnormal ECG When compared with ECG of 02-Jan-2023 09:34, Electronic atrial pacemaker has replaced Electronic  ventricular pacemaker Confirmed by Rochele Raring [phone]) on 05/29/2023 1:55:35 AM          RADIOLOGY: My personal review and interpretation of imaging: Bilateral knee x-rays show no acute abnormality.  I have personally reviewed all radiology reports. DG Knee Complete 4 Views Left  Result Date: 05/28/2023 CLINICAL DATA:  Fall, left knee pain EXAM: LEFT KNEE - COMPLETE 4+ VIEW COMPARISON:  None Available. FINDINGS: No acute fracture or dislocation. There is tricompartmental degenerative arthritis, mild within the lateral and patellofemoral compartments and relatively severe within the medial compartment with resultant overall mild varus angulation of the left knee. No effusion. Mild degenerative enthesopathy involving the quadriceps tendon insertion upon the patella. IMPRESSION: 1. Tricompartmental degenerative arthritis, relatively severe within the medial compartment. Electronically Signed   By: Helyn Numbers M.D.   On: 05/28/2023 23:28   DG Knee Complete 4 Views Right  Result Date: 05/28/2023 CLINICAL DATA:  Right knee pain EXAM: RIGHT KNEE - COMPLETE 4+ VIEW COMPARISON:  None Available. FINDINGS: No acute fracture or dislocation. Moderate to severe tricompartmental degenerative arthritis, most severe within the medial compartment, with resultant mild overall straightening. Small right knee effusion. Mild degenerative enthesopathy involving the insertion of the quadriceps and patellar tendons upon the patella. IMPRESSION: 1. Moderate to severe tricompartmental degenerative arthritis. Electronically Signed   By: Helyn Numbers M.D.   On: 05/28/2023 23:27     PROCEDURES:  Critical Care performed: No   CRITICAL CARE Performed by: Baxter Hire Mandeep Ferch   Total critical care time: 0 minutes  Critical care time was exclusive of separately billable procedures and treating other patients.  Critical care was necessary to treat or prevent imminent or life-threatening deterioration.  Critical care  was time spent personally by me on the following activities: development of treatment plan with patient and/or surrogate as well as nursing, discussions with consultants, evaluation of patient's response to treatment, examination of patient, obtaining history from patient or surrogate, ordering and performing treatments and interventions, ordering and review of laboratory studies, ordering and review of radiographic studies, pulse oximetry and re-evaluation of patient's condition.   Procedures    IMPRESSION / MDM / ASSESSMENT AND  PLAN / ED COURSE  I reviewed the triage vital signs and the nursing notes.  Patient here with fall.  Skin tears to right arm.  Complaining of bilateral knee pain.  No head injury.     DIFFERENTIAL DIAGNOSIS (includes but not limited to):   Skin tears, knee contusions, doubt fracture or dislocation  Patient's presentation is most consistent with acute complicated illness / injury requiring diagnostic workup.  PLAN: Workup initiated from triage.  Labs show hemoglobin of 11.  Normal electrolytes and renal function.  Urine does not appear infected.  X-rays of bilateral knees show no acute abnormality when reviewed and interpreted by myself and the radiologist.  First troponin slightly elevated.  Will obtain second troponin.  EKG shows a paced rhythm.  He is not complaining of any chest pain or shortness of breath.  He states he fell because his legs gave out on him which she states is not uncommon.   MEDICATIONS GIVEN IN ED: Medications  HYDROcodone-acetaminophen (NORCO/VICODIN) 5-325 MG per tablet 1 tablet (1 tablet Oral Given 05/29/23 0208)  bacitracin ointment (1 Application Topical Given 05/29/23 0209)     ED COURSE: Second troponin still slightly elevated but stable.  I do not feel he needs further workup at this time for this.  He is at his baseline.  He was given pain medication here for his knee pain.  I feel he is safe to go back to his nursing  facility.   At this time, I do not feel there is any life-threatening condition present. I reviewed all nursing notes, vitals, pertinent previous records.  All lab and urine results, EKGs, imaging ordered have been independently reviewed and interpreted by myself.  I reviewed all available radiology reports from any imaging ordered this visit.  Based on my assessment, I feel the patient is safe to be discharged home without further emergent workup and can continue workup as an outpatient as needed. Discussed all findings, treatment plan as well as usual and customary return precautions.  They verbalize understanding and are comfortable with this plan.  Outpatient follow-up has been provided as needed.  All questions have been answered.    CONSULTS:  none   OUTSIDE RECORDS REVIEWED: [facility].       FINAL CLINICAL IMPRESSION(S) / ED DIAGNOSES   Final diagnoses:  Fall, initial encounter  Acute bilateral knee pain  Skin tear of right upper arm without complication, initial encounter     Rx / DC Orders   ED Discharge Orders     None        Note:  This document was prepared using Dragon voice recognition software and may include unintentional dictation errors.   Braylei Totino, Layla Maw, DO 05/29/23 [phone]

## 2023-05-30 DIAGNOSIS — D649 Anemia, unspecified: Secondary | ICD-10-CM | POA: Diagnosis not present

## 2023-05-30 DIAGNOSIS — R634 Abnormal weight loss: Secondary | ICD-10-CM | POA: Diagnosis not present

## 2023-05-30 DIAGNOSIS — R600 Localized edema: Secondary | ICD-10-CM | POA: Diagnosis not present

## 2023-05-30 DIAGNOSIS — E559 Vitamin D deficiency, unspecified: Secondary | ICD-10-CM | POA: Diagnosis not present

## 2023-05-30 DIAGNOSIS — I2510 Atherosclerotic heart disease of native coronary artery without angina pectoris: Secondary | ICD-10-CM | POA: Diagnosis not present

## 2023-05-30 DIAGNOSIS — I4891 Unspecified atrial fibrillation: Secondary | ICD-10-CM | POA: Diagnosis not present

## 2023-05-30 DIAGNOSIS — K5900 Constipation, unspecified: Secondary | ICD-10-CM | POA: Diagnosis not present

## 2023-05-30 DIAGNOSIS — S41111A Laceration without foreign body of right upper arm, initial encounter: Secondary | ICD-10-CM | POA: Diagnosis not present

## 2023-05-30 DIAGNOSIS — R5381 Other malaise: Secondary | ICD-10-CM | POA: Diagnosis not present

## 2023-05-30 DIAGNOSIS — M170 Bilateral primary osteoarthritis of knee: Secondary | ICD-10-CM | POA: Diagnosis not present

## 2023-05-31 DIAGNOSIS — R262 Difficulty in walking, not elsewhere classified: Secondary | ICD-10-CM | POA: Diagnosis not present

## 2023-05-31 DIAGNOSIS — M6281 Muscle weakness (generalized): Secondary | ICD-10-CM | POA: Diagnosis not present

## 2023-05-31 DIAGNOSIS — I4819 Other persistent atrial fibrillation: Secondary | ICD-10-CM | POA: Diagnosis not present

## 2023-06-01 ENCOUNTER — Other Ambulatory Visit: Payer: Self-pay

## 2023-06-01 ENCOUNTER — Inpatient Hospital Stay
Admission: EM | Admit: 2023-06-01 | Discharge: 2023-06-07 | DRG: 291 | Disposition: A | Discharge status: Skilled Nursing Facility | Payer: No Typology Code available for payment source | Source: Skilled Nursing Facility | Attending: Student | Admitting: Student

## 2023-06-01 ENCOUNTER — Emergency Department: Payer: No Typology Code available for payment source

## 2023-06-01 DIAGNOSIS — I249 Acute ischemic heart disease, unspecified: Secondary | ICD-10-CM | POA: Diagnosis not present

## 2023-06-01 DIAGNOSIS — E8809 Other disorders of plasma-protein metabolism, not elsewhere classified: Secondary | ICD-10-CM | POA: Diagnosis present

## 2023-06-01 DIAGNOSIS — R0602 Shortness of breath: Secondary | ICD-10-CM | POA: Diagnosis present

## 2023-06-01 DIAGNOSIS — I5031 Acute diastolic (congestive) heart failure: Secondary | ICD-10-CM | POA: Diagnosis present

## 2023-06-01 DIAGNOSIS — I509 Heart failure, unspecified: Secondary | ICD-10-CM

## 2023-06-01 DIAGNOSIS — I4891 Unspecified atrial fibrillation: Secondary | ICD-10-CM | POA: Diagnosis present

## 2023-06-01 DIAGNOSIS — F419 Anxiety disorder, unspecified: Secondary | ICD-10-CM | POA: Diagnosis present

## 2023-06-01 DIAGNOSIS — I110 Hypertensive heart disease with heart failure: Secondary | ICD-10-CM | POA: Diagnosis present

## 2023-06-01 DIAGNOSIS — I2699 Other pulmonary embolism without acute cor pulmonale: Secondary | ICD-10-CM | POA: Diagnosis not present

## 2023-06-01 DIAGNOSIS — I495 Sick sinus syndrome: Secondary | ICD-10-CM | POA: Diagnosis present

## 2023-06-01 DIAGNOSIS — D519 Vitamin B12 deficiency anemia, unspecified: Secondary | ICD-10-CM | POA: Diagnosis present

## 2023-06-01 DIAGNOSIS — Z8249 Family history of ischemic heart disease and other diseases of the circulatory system: Secondary | ICD-10-CM

## 2023-06-01 DIAGNOSIS — I700 Atherosclerosis of aorta: Secondary | ICD-10-CM | POA: Diagnosis present

## 2023-06-01 DIAGNOSIS — E785 Hyperlipidemia, unspecified: Secondary | ICD-10-CM | POA: Diagnosis present

## 2023-06-01 DIAGNOSIS — Z87891 Personal history of nicotine dependence: Secondary | ICD-10-CM | POA: Diagnosis not present

## 2023-06-01 DIAGNOSIS — R778 Other specified abnormalities of plasma proteins: Secondary | ICD-10-CM | POA: Diagnosis not present

## 2023-06-01 DIAGNOSIS — Z955 Presence of coronary angioplasty implant and graft: Secondary | ICD-10-CM

## 2023-06-01 DIAGNOSIS — E611 Iron deficiency: Secondary | ICD-10-CM | POA: Diagnosis present

## 2023-06-01 DIAGNOSIS — I2489 Other forms of acute ischemic heart disease: Secondary | ICD-10-CM | POA: Diagnosis present

## 2023-06-01 DIAGNOSIS — I25110 Atherosclerotic heart disease of native coronary artery with unstable angina pectoris: Secondary | ICD-10-CM | POA: Diagnosis present

## 2023-06-01 DIAGNOSIS — R7989 Other specified abnormal findings of blood chemistry: Secondary | ICD-10-CM | POA: Insufficient documentation

## 2023-06-01 DIAGNOSIS — Z862 Personal history of diseases of the blood and blood-forming organs and certain disorders involving the immune mechanism: Secondary | ICD-10-CM

## 2023-06-01 DIAGNOSIS — R29898 Other symptoms and signs involving the musculoskeletal system: Secondary | ICD-10-CM | POA: Diagnosis not present

## 2023-06-01 DIAGNOSIS — E876 Hypokalemia: Secondary | ICD-10-CM | POA: Diagnosis present

## 2023-06-01 DIAGNOSIS — R634 Abnormal weight loss: Secondary | ICD-10-CM | POA: Insufficient documentation

## 2023-06-01 DIAGNOSIS — R64 Cachexia: Secondary | ICD-10-CM | POA: Diagnosis present

## 2023-06-01 DIAGNOSIS — Z811 Family history of alcohol abuse and dependence: Secondary | ICD-10-CM

## 2023-06-01 DIAGNOSIS — F32A Depression, unspecified: Secondary | ICD-10-CM | POA: Diagnosis present

## 2023-06-01 DIAGNOSIS — Z1152 Encounter for screening for COVID-19: Secondary | ICD-10-CM | POA: Diagnosis not present

## 2023-06-01 DIAGNOSIS — N400 Enlarged prostate without lower urinary tract symptoms: Secondary | ICD-10-CM | POA: Diagnosis present

## 2023-06-01 DIAGNOSIS — R2681 Unsteadiness on feet: Secondary | ICD-10-CM | POA: Diagnosis not present

## 2023-06-01 DIAGNOSIS — Z950 Presence of cardiac pacemaker: Secondary | ICD-10-CM | POA: Diagnosis not present

## 2023-06-01 DIAGNOSIS — Z888 Allergy status to other drugs, medicaments and biological substances status: Secondary | ICD-10-CM

## 2023-06-01 DIAGNOSIS — K219 Gastro-esophageal reflux disease without esophagitis: Secondary | ICD-10-CM | POA: Diagnosis present

## 2023-06-01 DIAGNOSIS — I10 Essential (primary) hypertension: Secondary | ICD-10-CM | POA: Diagnosis present

## 2023-06-01 DIAGNOSIS — R531 Weakness: Secondary | ICD-10-CM | POA: Diagnosis not present

## 2023-06-01 DIAGNOSIS — M6259 Muscle wasting and atrophy, not elsewhere classified, multiple sites: Secondary | ICD-10-CM | POA: Diagnosis present

## 2023-06-01 DIAGNOSIS — Z880 Allergy status to penicillin: Secondary | ICD-10-CM

## 2023-06-01 DIAGNOSIS — Z7901 Long term (current) use of anticoagulants: Secondary | ICD-10-CM | POA: Diagnosis not present

## 2023-06-01 DIAGNOSIS — E569 Vitamin deficiency, unspecified: Secondary | ICD-10-CM | POA: Diagnosis not present

## 2023-06-01 DIAGNOSIS — R911 Solitary pulmonary nodule: Secondary | ICD-10-CM | POA: Diagnosis present

## 2023-06-01 DIAGNOSIS — J45901 Unspecified asthma with (acute) exacerbation: Secondary | ICD-10-CM | POA: Diagnosis not present

## 2023-06-01 DIAGNOSIS — Z6824 Body mass index (BMI) 24.0-24.9, adult: Secondary | ICD-10-CM

## 2023-06-01 DIAGNOSIS — Z7902 Long term (current) use of antithrombotics/antiplatelets: Secondary | ICD-10-CM

## 2023-06-01 DIAGNOSIS — I480 Paroxysmal atrial fibrillation: Secondary | ICD-10-CM | POA: Diagnosis present

## 2023-06-01 DIAGNOSIS — Z881 Allergy status to other antibiotic agents status: Secondary | ICD-10-CM | POA: Diagnosis not present

## 2023-06-01 DIAGNOSIS — Z79899 Other long term (current) drug therapy: Secondary | ICD-10-CM | POA: Diagnosis not present

## 2023-06-01 DIAGNOSIS — K5900 Constipation, unspecified: Secondary | ICD-10-CM | POA: Diagnosis present

## 2023-06-01 DIAGNOSIS — R0609 Other forms of dyspnea: Secondary | ICD-10-CM | POA: Diagnosis not present

## 2023-06-01 DIAGNOSIS — R188 Other ascites: Secondary | ICD-10-CM | POA: Diagnosis present

## 2023-06-01 DIAGNOSIS — Z743 Need for continuous supervision: Secondary | ICD-10-CM | POA: Diagnosis not present

## 2023-06-01 HISTORY — DX: Other ill-defined heart diseases: I51.89

## 2023-06-01 HISTORY — DX: Atherosclerotic heart disease of native coronary artery without angina pectoris: I25.10

## 2023-06-01 HISTORY — DX: Unspecified cataract: H26.9

## 2023-06-01 LAB — TROPONIN I (HIGH SENSITIVITY)
Troponin I (High Sensitivity): 43 ng/L — ABNORMAL HIGH (ref <18)
Troponin I (High Sensitivity): 45 ng/L — ABNORMAL HIGH (ref <18)

## 2023-06-01 LAB — URINALYSIS, ROUTINE W REFLEX MICROSCOPIC
Bilirubin Urine: NEGATIVE
Glucose, UA: NEGATIVE mg/dL
Hgb urine dipstick: NEGATIVE
Ketones, ur: NEGATIVE mg/dL
Leukocytes,Ua: NEGATIVE
Nitrite: NEGATIVE
Protein, ur: NEGATIVE mg/dL
Specific Gravity, Urine: 1.017 (ref 1.005–1.030)
pH: 5 (ref 5.0–8.0)

## 2023-06-01 LAB — CBC
HCT: 31.2 % — ABNORMAL LOW (ref 39.0–52.0)
Hemoglobin: 10.6 g/dL — ABNORMAL LOW (ref 13.0–17.0)
MCH: 31.6 pg (ref 26.0–34.0)
MCHC: 34 g/dL (ref 30.0–36.0)
MCV: 93.1 fL (ref 80.0–100.0)
Platelets: 221 10*3/uL (ref 150–400)
RBC: 3.35 MIL/uL — ABNORMAL LOW (ref 4.22–5.81)
RDW: 14.9 % (ref 11.5–15.5)
WBC: 5 10*3/uL (ref 4.0–10.5)
nRBC: 0 % (ref 0.0–0.2)

## 2023-06-01 LAB — COMPREHENSIVE METABOLIC PANEL
ALT: 37 U/L (ref 0–44)
AST: 35 U/L (ref 15–41)
Albumin: 2.9 g/dL — ABNORMAL LOW (ref 3.5–5.0)
Alkaline Phosphatase: 63 U/L (ref 38–126)
Anion gap: 8 (ref 5–15)
BUN: 21 mg/dL (ref 8–23)
CO2: 25 mmol/L (ref 22–32)
Calcium: 8.7 mg/dL — ABNORMAL LOW (ref 8.9–10.3)
Chloride: 104 mmol/L (ref 98–111)
Creatinine, Ser: 0.63 mg/dL (ref 0.61–1.24)
GFR, Estimated: >60 mL/min (ref >60)
Glucose, Bld: 116 mg/dL — ABNORMAL HIGH (ref 70–99)
Potassium: 3.6 mmol/L (ref 3.5–5.1)
Sodium: 137 mmol/L (ref 135–145)
Total Bilirubin: 0.6 mg/dL (ref 0.3–1.2)
Total Protein: 6.6 g/dL (ref 6.5–8.1)

## 2023-06-01 LAB — SARS CORONAVIRUS 2 BY RT PCR: SARS Coronavirus 2 by RT PCR: NEGATIVE

## 2023-06-01 LAB — BRAIN NATRIURETIC PEPTIDE: B Natriuretic Peptide: 1466.5 pg/mL — ABNORMAL HIGH (ref 0.0–100.0)

## 2023-06-01 MED ORDER — DILTIAZEM HCL ER COATED BEADS 120 MG PO CP24
120.0000 mg | ORAL_CAPSULE | Freq: Every day | ORAL | Status: DC
  Administered 2023-06-01 – 2023-06-04 (×3): 120 mg via ORAL
  Filled 2023-06-01 (×4): qty 1

## 2023-06-01 MED ORDER — TAMSULOSIN HCL 0.4 MG PO CAPS
0.4000 mg | ORAL_CAPSULE | Freq: Every day | ORAL | Status: DC
  Administered 2023-06-01 – 2023-06-06 (×6): 0.4 mg via ORAL
  Filled 2023-06-01 (×6): qty 1

## 2023-06-01 MED ORDER — FINASTERIDE 5 MG PO TABS
5.0000 mg | ORAL_TABLET | Freq: Every day | ORAL | Status: DC
  Administered 2023-06-02 – 2023-06-07 (×6): 5 mg via ORAL
  Filled 2023-06-01 (×6): qty 1

## 2023-06-01 MED ORDER — ONDANSETRON HCL 4 MG/2ML IJ SOLN
4.0000 mg | Freq: Four times a day (QID) | INTRAMUSCULAR | Status: AC | PRN
  Filled 2023-06-01: qty 2

## 2023-06-01 MED ORDER — HEPARIN SODIUM (PORCINE) 5000 UNIT/ML IJ SOLN: 5000.0000 [IU] | Freq: Three times a day (TID) | INTRAMUSCULAR | Status: DC

## 2023-06-01 MED ORDER — CLOPIDOGREL BISULFATE 75 MG PO TABS
75.0000 mg | ORAL_TABLET | Freq: Every day | ORAL | Status: DC
  Administered 2023-06-01 – 2023-06-07 (×7): 75 mg via ORAL
  Filled 2023-06-01 (×7): qty 1

## 2023-06-01 MED ORDER — SENNOSIDES-DOCUSATE SODIUM 8.6-50 MG PO TABS
1.0000 | ORAL_TABLET | Freq: Two times a day (BID) | ORAL | Status: DC
  Administered 2023-06-01 – 2023-06-04 (×6): 1 via ORAL
  Filled 2023-06-01 (×6): qty 1

## 2023-06-01 MED ORDER — FUROSEMIDE 10 MG/ML IJ SOLN
40.0000 mg | Freq: Once | INTRAMUSCULAR | Status: AC
  Administered 2023-06-01: 40 mg via INTRAVENOUS
  Filled 2023-06-01: qty 4

## 2023-06-01 MED ORDER — ACETAMINOPHEN 325 MG PO TABS
650.0000 mg | ORAL_TABLET | Freq: Four times a day (QID) | ORAL | Status: AC | PRN
  Administered 2023-06-02 – 2023-06-06 (×5): 650 mg via ORAL
  Filled 2023-06-01 (×5): qty 2

## 2023-06-01 MED ORDER — ONDANSETRON HCL 4 MG PO TABS: 4.0000 mg | ORAL_TABLET | Freq: Four times a day (QID) | ORAL | Status: AC | PRN

## 2023-06-01 MED ORDER — VITAMIN B-12 500 MCG PO TABS: 1000.0000 ug | ORAL_TABLET | Freq: Every day | ORAL | Status: DC

## 2023-06-01 MED ORDER — ATORVASTATIN CALCIUM 80 MG PO TABS
80.0000 mg | ORAL_TABLET | Freq: Every day | ORAL | Status: DC
  Administered 2023-06-02: 80 mg via ORAL
  Filled 2023-06-01: qty 4

## 2023-06-01 MED ORDER — APIXABAN 5 MG PO TABS
5.0000 mg | ORAL_TABLET | Freq: Two times a day (BID) | ORAL | Status: DC
  Administered 2023-06-01 – 2023-06-07 (×12): 5 mg via ORAL
  Filled 2023-06-01 (×12): qty 1

## 2023-06-01 MED ORDER — ACETAMINOPHEN 650 MG RE SUPP: 650.0000 mg | Freq: Four times a day (QID) | RECTAL | Status: DC | PRN

## 2023-06-01 MED ORDER — SENNOSIDES-DOCUSATE SODIUM 8.6-50 MG PO TABS: 1.0000 | ORAL_TABLET | Freq: Every evening | ORAL | Status: DC | PRN

## 2023-06-01 NOTE — Assessment & Plan Note (Addendum)
Diltiazem 120 mg daily resumed, Eliquis resumed

## 2023-06-01 NOTE — Assessment & Plan Note (Addendum)
With elevated high sensitive troponin In the last two months, 2 episodes of chest pressure relieved with rest, per patient did not improve after stent placement at [facility] in July 2024 Complete echo ordered Cardiology has been consulted via staff message to Dr. Tenny Craw and CV [facility] pool

## 2023-06-01 NOTE — Assessment & Plan Note (Signed)
-   Atorvastatin 80 mg daily resumed 

## 2023-06-01 NOTE — ED Provider Notes (Signed)
[facility] Provider Note    Event Date/Time   First MD Initiated Contact with Patient 06/01/23 1459     (approximate)   History   Chief Complaint Weakness   HPI  [name] is a 89 y.o. male with past medical history of hypertension, hyperlipidemia, atrial fibrillation on Eliquis, and sick sinus syndrome status post pacemaker who presents to the ED complaining of weakness.  Patient reports that he has been increasingly weak in general over the past 2 weeks since arriving at [facility] assisted living facility.  He states that he was able to walk using a walker when he first arrived, he is now no longer able to ambulate even with the assistance of a walker due to severe weakness in his legs.  He states the weakness affects both of his legs and he denies any pain in the legs.  He does state he has having intermittent pain in his chest and difficulty breathing over the past couple of days, denies associated fever or cough.  He has noticed a small amount of swelling in both legs, but denies any associated rash.  He has not had any dysuria, abdominal pain, vomiting, or diarrhea.     Physical Exam   Triage Vital Signs: ED Triage Vitals  Encounter Vitals Group     BP 06/01/23 1424 115/61     Systolic BP Percentile --      Diastolic BP Percentile --      Pulse Rate 06/01/23 1422 77     Resp 06/01/23 1422 20     Temp 06/01/23 1424 97.8 F (36.6 C)     Temp Source 06/01/23 1424 Oral     SpO2 06/01/23 1422 99 %     Weight 06/01/23 1421 168 lb 6.9 oz (76.4 kg)     Height 06/01/23 1421 5\' 10"  (1.778 m)     Head Circumference --      Peak Flow --      Pain Score 06/01/23 1422 0     Pain Loc --      Pain Education --      Exclude from Growth Chart --     Most recent vital signs: Vitals:   06/01/23 1637 06/01/23 1700  BP: 122/64 104/69  Pulse: (!) 59 (!) 59  Resp: 15 14  Temp:    SpO2:      Constitutional: Alert and oriented. Eyes: Conjunctivae  are normal. Head: Atraumatic. Nose: No congestion/rhinnorhea. Mouth/Throat: Mucous membranes are moist.  Cardiovascular: Normal rate, regular rhythm. Grossly normal heart sounds.  2+ radial pulses bilaterally. Respiratory: Normal respiratory effort.  No retractions. Lungs CTAB. Gastrointestinal: Soft and nontender. No distention. Musculoskeletal: No lower extremity tenderness, 1+ pitting edema to knees bilaterally.  Neurologic:  Normal speech and language. No gross focal neurologic deficits are appreciated.    ED Results / Procedures / Treatments   Labs (all labs ordered are listed, but only abnormal results are displayed) Labs Reviewed  CBC - Abnormal; Notable for the following components:      Result Value   RBC 3.35 (*)    Hemoglobin 10.6 (*)    HCT 31.2 (*)    All other components within normal limits  COMPREHENSIVE METABOLIC PANEL - Abnormal; Notable for the following components:   Glucose, Bld 116 (*)    Calcium 8.7 (*)    Albumin 2.9 (*)    All other components within normal limits  URINALYSIS, ROUTINE W REFLEX MICROSCOPIC - Abnormal; Notable  for the following components:   Color, Urine YELLOW (*)    APPearance CLEAR (*)    All other components within normal limits  BRAIN NATRIURETIC PEPTIDE - Abnormal; Notable for the following components:   B Natriuretic Peptide 1,466.5 (*)    All other components within normal limits  TROPONIN I (HIGH SENSITIVITY) - Abnormal; Notable for the following components:   Troponin I (High Sensitivity) 43 (*)    All other components within normal limits  SARS CORONAVIRUS 2 BY RT PCR     EKG  ED ECG REPORT I, Chesley Noon, the attending physician, personally viewed and interpreted this ECG.   Date: 06/01/2023  EKG Time: 14:25  Rate: 65  Rhythm: Ventricular paced rhythm  Axis: LAD  Intervals:nonspecific intraventricular conduction delay  ST&T Change: None  RADIOLOGY Chest x-ray reviewed and interpreted by me with no  infiltrate, edema, or effusion.  PROCEDURES:  Critical Care performed: No  Procedures   MEDICATIONS ORDERED IN ED: Medications  furosemide (LASIX) injection 40 mg (has no administration in time range)     IMPRESSION / MDM / ASSESSMENT AND PLAN / ED COURSE  I reviewed the triage vital signs and the nursing notes.                              89 y.o. male with past medical history of hypertension, hyperlipidemia, atrial fibrillation on Eliquis and sick sinus syndrome status post pacemaker who presents to the ED with increasing generalized weakness over the past 2 weeks, now with inability to walk.  Patient's presentation is most consistent with acute presentation with potential threat to life or bodily function.  Differential diagnosis includes, but is not limited to, stroke, anemia, electrolyte abnormality, AKI, ACS, CHF, UTI.  Patient chronically ill but nontoxic-appearing and in no acute distress, vital signs are unremarkable.  He seems weak in both legs but has no focal neurologic deficits, remains neurovascularly intact to his lower extremities.  He does have trace edema to his lower extremities, reports some chest pain and shortness of breath over the past few days but no active chest pain.  EKG shows no evidence of arrhythmia or ischemia, initial troponin mildly elevated.  Troponin elevation is similar to his visit 3 days ago for a fall, however prior to that he never had an elevated troponin.  He denied chest pain or shortness of breath at that time but today does endorse the symptoms.  We will check CT head and trend troponin, chest x-ray is unremarkable.  No significant electrolyte abnormality or AKI noted, LFTs are unremarkable.  Urinalysis pending at this time.  CT head is negative for acute process, BNP is elevated but chest x-ray shows no evidence of pulmonary edema.  With his reported difficulty breathing and leg swelling, we will diurese with IV Lasix for possible developing  CHF.  Urinalysis shows no signs of infection.  Given patient's significant weakness and inability to walk, case discussed with hospitalist for admission.      FINAL CLINICAL IMPRESSION(S) / ED DIAGNOSES   Final diagnoses:  Generalized weakness  Elevated troponin     Rx / DC Orders   ED Discharge Orders     None        Note:  This document was prepared using Dragon voice recognition software and may include unintentional dictation errors.   Chesley Noon, MD 06/01/23 1754

## 2023-06-01 NOTE — Assessment & Plan Note (Signed)
Will recheck second high sensitive troponin

## 2023-06-01 NOTE — ED Triage Notes (Signed)
Patient to ER from [facility] per family request for deterioration; patient report weakness in bilateral legs and arms for 2-3 weeks since being admitted to [place].

## 2023-06-01 NOTE — Assessment & Plan Note (Signed)
Diltiazem capsule 120 mg p.o. daily resumed

## 2023-06-01 NOTE — Assessment & Plan Note (Signed)
Status post pacemaker 

## 2023-06-01 NOTE — ED Notes (Signed)
Pt encouraged to give urine sample

## 2023-06-01 NOTE — Hospital Course (Signed)
Mr. [name] is a 89 year old male with history of anxiety, depression, A-fib on Eliquis, hypertension, hyperlipidemia, history of sick sinus syndrome status post pacemaker placement, history of B12 deficiency, who presents to the emergency department for chief concerns of generalized weakness, worsening at assisted living facility.  Vitals in the ED showed temperature of 97.8, respiration rate of 20, heart rate of 77, blood pressure 115/61, SpO2 of 99% on room air.  sNa is 137, K 3.6, chloride 104, bicarb 25, BUN of 21, serum creatinine of 0.63, nonfasting blood glucose 116, EGFR greater than 60, WBC 5.0, hemoglobin 10.6, platelets of 221.  BMP with elevated 1066.5.  High sensitive troponin is 43.  UA was negative for leukocytes and nitrates.  ED treatment: Furosemide 40 mg IV one-time dose.

## 2023-06-01 NOTE — ED Notes (Signed)
Patient called out requesting to try and drink water versus being cathed. Ok per Dr. Larinda Buttery.

## 2023-06-01 NOTE — ED Notes (Signed)
Assisted pt with using urinal. Pt wanted to sit up and [place] it. Emptied urinal 175 mL. Assisted pt with laying back down. Pt is comfortable.

## 2023-06-01 NOTE — Assessment & Plan Note (Signed)
Recommend patient follow-up closely with PCP

## 2023-06-01 NOTE — ED Notes (Signed)
Pt unable to give urine sample. Dr Larinda Buttery aware. Dr Larinda Buttery said to address the possibility of an in and out cath with pt to obtain urine sample.

## 2023-06-01 NOTE — H&P (Addendum)
History and Physical   [name] SAY:[id number] DOB: 12/28/1932 DOA: 06/01/2023  PCP: Karie Schwalbe, MD  Patient coming from: [facility]  I have personally briefly reviewed patient's old medical records in [facility] EMR.  Chief Concern: Weakness, chest pressure  HPI: [name] is a 89 year old male with history of anxiety, depression, A-fib on Eliquis, hypertension, hyperlipidemia, history of sick sinus syndrome status post pacemaker placement, history of B12 deficiency, who presents to the emergency department for chief concerns of generalized weakness, worsening at assisted living facility.  Vitals in the ED showed temperature of 97.8, respiration rate of 20, heart rate of 77, blood pressure 115/61, SpO2 of 99% on room air.  sNa is 137, K 3.6, chloride 104, bicarb 25, BUN of 21, serum creatinine of 0.63, nonfasting blood glucose 116, EGFR greater than 60, WBC 5.0, hemoglobin 10.6, platelets of 221.  BMP with elevated 1066.5.  High sensitive troponin is 43.  UA was negative for leukocytes and nitrates.  ED treatment: Furosemide 40 mg IV one-time dose. ----------------------------- At bedside, patient was able to tell me his name, age, location, current calendar year.  He reports that he has had an intentional weight loss of about 15 pounds over the last 4 months.  Over the course of the last 2 months, he has been having chest pain worse with exertion.  He reports that he was evaluated by the [facility] and received 1 stent placement in July 2024.  He was discharged from the [place].  He reports that he still has the chest pressure with exertion.  He denies trauma to his person.  He denies syncope, loss of consciousness.  He denies dysuria, hematuria, diarrhea.  Patient reports that initially after the [place], he was taking aspirin, Plavix, Eliquis daily.  He has since been told to stop the aspirin and is currently only on Plavix and Eliquis.  He reports compliance with  his home medications.  Social history: He is at [employer].  He denies tobacco, EtOH, recreational drug use.  ROS: Constitutional: no weight change, no fever ENT/Mouth: no sore throat, no rhinorrhea Eyes: no eye pain, no vision changes Cardiovascular: + chest pain, + dyspnea,  no edema, no palpitations Respiratory: no cough, no sputum, no wheezing Gastrointestinal: no nausea, no vomiting, no diarrhea, no constipation Genitourinary: no urinary incontinence, no dysuria, no hematuria Musculoskeletal: no arthralgias, no myalgias Skin: no skin lesions, no pruritus, Neuro: + weakness, no loss of consciousness, no syncope Psych: no anxiety, no depression, + decrease appetite Heme/Lymph: no bruising, no bleeding  ED Course: Discussed with emergency medicine provider, patient requiring hospitalization for chief concerns of shortness of breath with exertion  Assessment/Plan  Principal Problem:   Shortness of breath on exertion Active Problems:   Hyperlipemia   Essential hypertension, benign   Atrial fibrillation (HCC)   Sick sinus syndrome (HCC)   GERD (gastroesophageal reflux disease)   Acute on chronic heart failure (HCC)   History of anemia due to vitamin B12 deficiency   Elevated troponin   Unintentional weight loss   Assessment and Plan:  * Shortness of breath on exertion With elevated high sensitive troponin In the last two months, 2 episodes of chest pressure relieved with rest, per patient did not improve after stent placement at [facility] in July 2024 Complete echo ordered Cardiology has been consulted via staff message to Dr. Tenny Craw and CV [facility] pool  Unintentional weight loss Recommend patient follow-up closely with PCP  Elevated troponin  Will recheck second high sensitive troponin  History of anemia due to vitamin B12 deficiency Given history of worsening weakness over the last several months, with history of B12 deficiency, and last B12 check was January 2023, we will  check B12 level on admission  Sick sinus syndrome (HCC) Status post pacemaker  Atrial fibrillation (HCC) Diltiazem 120 mg daily resumed, Eliquis resumed  Essential hypertension, benign Diltiazem capsule 120 mg p.o. daily resumed  Hyperlipemia Atorvastatin 80 mg daily resumed  Chart reviewed.   DVT prophylaxis: Apixaban 5 mg p.o. twice daily Code Status: Full code Diet: Heart healthy Family Communication: No Disposition Plan: Pending clinical course Consults called: Cardiology Admission status: Telemetry cardiac, inpatient  Past Medical History:  Diagnosis Date   Acute diverticulitis 12/15   [facility]   Acute diverticulitis    Allergic rhinitis    Atrial fibrillation or flutter 12/10   Has been paroxysmal. Patient has not tolerated coumadin due to headaches. ECHO (12/10) Mild LVH, EF 55%,    BPH (benign prostatic hyperplasia)    Chest pain 12/10   Lexiscan myoview with EF 55%, no ischemia or infarction   Chronic constipation 01/10/2015   Dysrhythmia    AFIB   HOH (hard of hearing)    Hyperlipemia    Hypertension    Osteoarthritis    Pacemaker  medtronic 02/06/2010   Qualifier: Diagnosis of  By: Ladona Ridgel, MD, Jerrell Mylar    Presence of permanent cardiac pacemaker    Sick sinus syndrome (HCC)    S/P dual chamber Medtronic PCM   Sigmoid volvulus [facility])    Past Surgical History:  Procedure Laterality Date   CATARACT EXTRACTION W/PHACO Left 01/16/2017   Procedure: CATARACT EXTRACTION PHACO AND INTRAOCULAR LENS PLACEMENT (IOC);  Surgeon: Sallee Lange, MD;  Location: [facility] ORS;  Service: Ophthalmology;  Laterality: Left;  [place] 1:51.3 AP% 25.9 CDE 54.80 FLUID PACK LOT # [id number]   COLONOSCOPY WITH PROPOFOL N/A 09/15/2018   Procedure: COLONOSCOPY WITH PROPOFOL;  Surgeon: Toney Reil, MD;  Location: [facility] ENDOSCOPY;  Service: Gastroenterology;  Laterality: N/A;   dual chamber pacemaker  01/11   Dr. Orpah Melter SIGMOIDOSCOPY N/A 10/07/2018   Procedure:  FLEXIBLE SIGMOIDOSCOPY;  Surgeon: Pasty Spillers, MD;  Location: [facility] ENDOSCOPY;  Service: Endoscopy;  Laterality: N/A;   LAPAROSCOPIC CHOLECYSTECTOMY  1/14   Dr Lemar Livings   PARTIAL COLECTOMY N/A 10/13/2018   Procedure: LAPAROTOMY, LEFT COLECTOMY,;  Surgeon: Ancil Linsey, MD;  Location: [facility] ORS;  Service: General;  Laterality: N/A;   VASECTOMY  in his 6's   Social History:  reports that he has quit smoking. He has been exposed to tobacco smoke. He has never used smokeless tobacco. He reports that he does not drink alcohol and does not use drugs.  Allergies  Allergen Reactions   Augmentin [Amoxicillin-Pot Clavulanate] Rash   Colchicine Rash   Morphine And Codeine     Shaking, back pain.   Family History  Problem Relation Age of Onset   Hypertension Mother    Alcohol abuse Father    Bladder Cancer Neg Hx    Prostate cancer Neg Hx    Kidney cancer Neg Hx    Family history: Family history reviewed and not pertinent.  Prior to Admission medications   Medication Sig Start Date End Date Taking? Authorizing Provider  acetaminophen (TYLENOL) 325 MG tablet Take 650 mg by mouth every 6 (six) hours as needed. Reported on 01/17/2016   Yes [provider]  apixaban (ELIQUIS) 5  MG TABS tablet Take 5 mg by mouth 2 (two) times daily. 06/19/22  Yes [provider]  clopidogrel (PLAVIX) 75 MG tablet TAKE ONE TABLET BY MOUTH EVERY DAY FOR BLOOD CLOT PREVENTION FOLLOWING PCI BE SURE TO CHECK WITH YOUR DOCTOR ABOUT HOW LONG YOU SHOULD TAKE THIS MEDICINE 02/01/23  Yes [provider]  diltiazem (TIAZAC) 120 MG 24 hr capsule Take 120 mg by mouth daily. 05/14/23  Yes [provider]  finasteride (PROSCAR) 5 MG tablet Take 1 tablet (5 mg total) by mouth daily. 09/13/17  Yes Karie Schwalbe, MD  sennosides-docusate sodium (SENOKOT-S) 8.6-50 MG tablet Take 1 tablet by mouth 2 (two) times daily.   Yes [provider]  tamsulosin (FLOMAX) 0.4 MG CAPS capsule TAKE  ONE CAPSULE BY MOUTH EVERY DAY Patient taking differently: Take 0.4 mg by mouth at bedtime. 08/16/14  Yes Karie Schwalbe, MD  ALPRAZolam (XANAX) 0.25 MG tablet TAKE 1 TABLET(0.25 MG) BY MOUTH TWICE DAILY AS NEEDED FOR ANXIETY Patient not taking: Reported on 02/28/2023 09/28/21   Karie Schwalbe, MD  atorvastatin (LIPITOR) 80 MG tablet TAKE ONE TABLET BY MOUTH AT BEDTIME FOR HIGH CHOLESTEROL Patient not taking: Reported on 06/01/2023 02/05/23   [provider]  Glucosamine 500 MG CAPS Take by mouth daily.   Patient not taking: Reported on 02/28/2023    [provider]  vitamin B-12 (CYANOCOBALAMIN) 500 MCG tablet Take 1,000 mcg by mouth daily. Patient not taking: Reported on 06/01/2023    [provider]   Physical Exam: Vitals:   06/01/23 1730 06/01/23 1800 06/01/23 1830 06/01/23 1930  BP: 121/65 121/61 126/68 (!) 143/116  Pulse: 67 (!) 59 (!) 59 60  Resp: 14 16 14 16   Temp:    97.6 F (36.4 C)  TempSrc:      SpO2:    99%  Weight:      Height:       Constitutional: appears older than chronological age, frail, cachectic appearing, NAD, calm Eyes: PERRL, lids and conjunctivae normal ENMT: Mucous membranes are moist. Posterior pharynx clear of any exudate or lesions. Age-appropriate dentition. Hearing appropriate Neck: normal, supple, no masses, no thyromegaly Respiratory: clear to auscultation bilaterally, no wheezing, no crackles. Normal respiratory effort. No accessory muscle use.  Cardiovascular: Regular rate and rhythm, no murmurs / rubs / gallops. No extremity edema. 2+ pedal pulses. No carotid bruits.  Abdomen: no tenderness, no masses palpated, no hepatosplenomegaly. Bowel sounds positive.  Musculoskeletal: no clubbing / cyanosis. No joint deformity upper and lower extremities. Good ROM, no contractures, no atrophy. Normal muscle tone.  Skin: no rashes, lesions, ulcers. No induration Neurologic: Sensation intact. Strength 5/5 in all 4.  Psychiatric:  Normal judgment and insight. Alert and oriented x 3. Normal mood.   EKG: independently reviewed, showing junctional rhythm with rate of 65, QTc 461  Chest x-ray on Admission: I personally reviewed and I agree with radiologist reading as below.  CT Head Wo Contrast  Result Date: 06/01/2023 CLINICAL DATA:  Neuro deficit, acute, stroke suspected. Patient reports progressive upper and lower extremity weakness over the last 2-3 weeks. Patient brought by family for progressive deterioration. EXAM: CT HEAD WITHOUT CONTRAST TECHNIQUE: Contiguous axial images were obtained from the base of the skull through the vertex without intravenous contrast. RADIATION DOSE REDUCTION: This exam was performed according to the departmental dose-optimization program which includes automated exposure control, adjustment of the mA and/or kV according to patient size and/or use of iterative reconstruction technique. COMPARISON:  None Available. FINDINGS: Brain: Mild atrophy and white matter changes are within normal limits for age. Deep brain nuclei are within normal limits. The ventricles are of normal size. No significant extraaxial fluid collection is present. The brainstem and cerebellum are within normal limits. Midline structures are within normal limits. Vascular: Atherosclerotic calcifications are present within the cavernous internal carotid arteries bilaterally. No hyperdense vessel is present. Skull: Calvarium is intact. No focal lytic or blastic lesions are present. No significant extracranial soft tissue lesion is present. Sinuses/Orbits: The paranasal sinuses and mastoid air cells are clear. Bilateral lens replacements are noted. Globes and orbits are otherwise unremarkable. Other: IMPRESSION: 1. No acute or focal lesion to explain the patient's symptoms. 2. Normal CT appearance of the brain for age. Electronically Signed   By: Marin Roberts M.D.   On: 06/01/2023 16:57   DG Chest Port 1 View  Result Date:  06/01/2023 CLINICAL DATA:  Weakness EXAM: PORTABLE CHEST 1 VIEW COMPARISON:  X-ray 09/30/2019 FINDINGS: Hyperinflation. Normal cardiopericardial silhouette. Calcified aorta. Minimal linear changes at the bases, likely scar or atelectasis. No pneumothorax or edema. Left upper chest pacemaker with leads along the right side of the heart. Degenerative changes of the spine and shoulders. IMPRESSION: Hyperinflation.  Minimal basilar scar or atelectasis.  Pacemaker Electronically Signed   By: Karen Kays M.D.   On: 06/01/2023 15:16    Labs on Admission: I have personally reviewed following labs  CBC: Recent Labs  Lab 05/28/23 2228 06/01/23 1426  WBC 5.1 5.0  HGB 11.4* 10.6*  HCT 34.7* 31.2*  MCV 94.0 93.1  PLT 186 221   Basic Metabolic Panel: Recent Labs  Lab 05/28/23 2228 06/01/23 1426  NA 134* 137  K 3.7 3.6  CL 103 104  CO2 23 25  GLUCOSE 91 116*  BUN 20 21  CREATININE 0.61 0.63  CALCIUM 8.5* 8.7*   GFR: Estimated Creatinine Clearance: 65.9 mL/min (by C-G formula based on SCr of 0.63 mg/dL).  Liver Function Tests: Recent Labs  Lab 06/01/23 1426  AST 35  ALT 37  ALKPHOS 63  BILITOT 0.6  PROT 6.6  ALBUMIN 2.9*   Urine analysis:    Component Value Date/Time   COLORURINE YELLOW (A) 06/01/2023 1627   APPEARANCEUR CLEAR (A) 06/01/2023 1627   APPEARANCEUR Hazy (A) 01/17/2016 1012   LABSPEC 1.017 06/01/2023 1627   LABSPEC 1.024 10/05/2014 1418   PHURINE 5.0 06/01/2023 1627   GLUCOSEU NEGATIVE 06/01/2023 1627   GLUCOSEU Negative 10/05/2014 1418   HGBUR NEGATIVE 06/01/2023 1627   BILIRUBINUR NEGATIVE 06/01/2023 1627   BILIRUBINUR neg 10/17/2017 1355   BILIRUBINUR Negative 01/17/2016 1012   BILIRUBINUR Negative 10/05/2014 1418   KETONESUR NEGATIVE 06/01/2023 1627   PROTEINUR NEGATIVE 06/01/2023 1627   UROBILINOGEN 0.2 10/17/2017 1355   NITRITE NEGATIVE 06/01/2023 1627   LEUKOCYTESUR NEGATIVE 06/01/2023 1627   LEUKOCYTESUR Negative 10/05/2014 1418   This document  was prepared using Dragon Voice Recognition software and may include unintentional dictation errors.  Dr. Sedalia Muta [facility]  If 7PM-7AM, please contact overnight-coverage provider If 7AM-7PM, please contact day attending provider [email or web address]  06/01/2023, 7:56 PM

## 2023-06-01 NOTE — Assessment & Plan Note (Signed)
Given history of worsening weakness over the last several months, with history of B12 deficiency, and last B12 check was January 2023, we will check B12 level on admission

## 2023-06-01 NOTE — ED Notes (Signed)
Patient eating dinner tray. 

## 2023-06-02 ENCOUNTER — Inpatient Hospital Stay (HOSPITAL_COMMUNITY)
Admit: 2023-06-02 | Discharge: 2023-06-02 | Disposition: A | Discharge status: Home / Self Care | Payer: No Typology Code available for payment source | Attending: Internal Medicine | Admitting: Internal Medicine

## 2023-06-02 ENCOUNTER — Inpatient Hospital Stay: Payer: No Typology Code available for payment source

## 2023-06-02 ENCOUNTER — Encounter: Payer: Self-pay | Admitting: Internal Medicine

## 2023-06-02 DIAGNOSIS — I249 Acute ischemic heart disease, unspecified: Secondary | ICD-10-CM

## 2023-06-02 DIAGNOSIS — R531 Weakness: Secondary | ICD-10-CM

## 2023-06-02 DIAGNOSIS — R7989 Other specified abnormal findings of blood chemistry: Secondary | ICD-10-CM | POA: Diagnosis not present

## 2023-06-02 DIAGNOSIS — I10 Essential (primary) hypertension: Secondary | ICD-10-CM | POA: Diagnosis not present

## 2023-06-02 DIAGNOSIS — R778 Other specified abnormalities of plasma proteins: Secondary | ICD-10-CM | POA: Diagnosis not present

## 2023-06-02 DIAGNOSIS — R0609 Other forms of dyspnea: Secondary | ICD-10-CM

## 2023-06-02 DIAGNOSIS — R0602 Shortness of breath: Secondary | ICD-10-CM | POA: Diagnosis not present

## 2023-06-02 LAB — PSA: Prostatic Specific Antigen: 0.39 ng/mL (ref 0.00–4.00)

## 2023-06-02 LAB — CBC
HCT: 33.2 % — ABNORMAL LOW (ref 39.0–52.0)
Hemoglobin: 11.3 g/dL — ABNORMAL LOW (ref 13.0–17.0)
MCH: 31.7 pg (ref 26.0–34.0)
MCHC: 34 g/dL (ref 30.0–36.0)
MCV: 93.3 fL (ref 80.0–100.0)
Platelets: 224 10*3/uL (ref 150–400)
RBC: 3.56 MIL/uL — ABNORMAL LOW (ref 4.22–5.81)
RDW: 14.7 % (ref 11.5–15.5)
WBC: 7.1 10*3/uL (ref 4.0–10.5)
nRBC: 0 % (ref 0.0–0.2)

## 2023-06-02 LAB — ECHOCARDIOGRAM COMPLETE
AR max vel: 1.54 cm2
AV Area VTI: 1.79 cm2
AV Area mean vel: 1.75 cm2
AV Mean grad: 12 mmHg
AV Peak grad: 25.2 mmHg
Ao pk vel: 2.51 m/s
Area-P 1/2: 2.87 cm2
Height: 70 in
MV VTI: 0.58 cm2
P 1/2 time: 648 ms
Radius: 0.4 cm
S' Lateral: 3 cm
Weight: 2694.9 [oz_av]

## 2023-06-02 LAB — BASIC METABOLIC PANEL
Anion gap: 6 (ref 5–15)
BUN: 18 mg/dL (ref 8–23)
CO2: 23 mmol/L (ref 22–32)
Calcium: 7.3 mg/dL — ABNORMAL LOW (ref 8.9–10.3)
Chloride: 109 mmol/L (ref 98–111)
Creatinine, Ser: 0.45 mg/dL — ABNORMAL LOW (ref 0.61–1.24)
GFR, Estimated: >60 mL/min (ref >60)
Glucose, Bld: 86 mg/dL (ref 70–99)
Potassium: 2.7 mmol/L — CL (ref 3.5–5.1)
Sodium: 138 mmol/L (ref 135–145)

## 2023-06-02 LAB — TSH: TSH: 2.562 u[IU]/mL (ref 0.350–4.500)

## 2023-06-02 LAB — CBG MONITORING, ED: Glucose-Capillary: 95 mg/dL (ref 70–99)

## 2023-06-02 LAB — CK: Total CK: 198 U/L (ref 49–397)

## 2023-06-02 LAB — MAGNESIUM: Magnesium: 1.3 mg/dL — ABNORMAL LOW (ref 1.7–2.4)

## 2023-06-02 LAB — VITAMIN B12: Vitamin B-12: 771 pg/mL (ref 180–914)

## 2023-06-02 MED ORDER — POTASSIUM CHLORIDE 10 MEQ/100ML IV SOLN
10.0000 meq | INTRAVENOUS | Status: AC
  Administered 2023-06-02 (×4): 10 meq via INTRAVENOUS
  Filled 2023-06-02 (×4): qty 100

## 2023-06-02 MED ORDER — MAGNESIUM SULFATE 4 GM/100ML IV SOLN
4.0000 g | Freq: Once | INTRAVENOUS | Status: AC
  Administered 2023-06-02: 4 g via INTRAVENOUS
  Filled 2023-06-02: qty 100

## 2023-06-02 MED ORDER — IOHEXOL 9 MG/ML PO SOLN: 500.0000 mL | ORAL | Status: AC

## 2023-06-02 MED ORDER — POTASSIUM CHLORIDE CRYS ER 20 MEQ PO TBCR
40.0000 meq | EXTENDED_RELEASE_TABLET | Freq: Once | ORAL | Status: AC
  Administered 2023-06-02: 40 meq via ORAL
  Filled 2023-06-02: qty 2

## 2023-06-02 NOTE — Progress Notes (Signed)
  Echocardiogram 2D Echocardiogram has been performed. Definity IV ultrasound imaging agent used on this study.  Lenor Coffin 06/02/2023, 5:37 PM

## 2023-06-02 NOTE — ED Notes (Signed)
Cardiology at bedside.

## 2023-06-02 NOTE — Consult Note (Addendum)
Cardiology Consult    Patient ID: [name] MRN: [id number], DOB/AGE: 1932/10/15   Admit date: 06/01/2023 Date of Consult: 06/02/2023  Primary Physician: Juan Schwalbe, MD Primary Cardiologist: [facility] Requesting Provider: Laurette Schimke, mD  Patient Profile    [name] is a 89 y.o. male with a history of CAD s/p recent stenting, PAF, diast dysfxn, HTN, HL, sinus node dysfxn s/p PPM, OA, and BPH, who is being seen today for the evaluation of unstable angina and mild hsTrop elevation at the request of [name].  Past Medical History   Past Medical History:  Diagnosis Date   Acute diverticulitis 09/2014   [facility]   Allergic rhinitis    BPH (benign prostatic hyperplasia)    CAD (coronary artery disease)    a. 10/2013 MV: low risk; b. 03/2020 Cath: mild-mod nonobs dzs; c. 08/2022 MV: tiny, mildly severe apical lateral ischemia; d. 01/2023 Cath/PCI: LCX 55m (2.25x38 Onyx Frontier DES - dil up to 3mm), OM1 70 (2x15 Onyx Frontier DES); e. 01/2023 relook Cath: LM/LAD no signif dzs, D2 40, LCX/OM1 patent stents, RCA 10p/m/d-->Med Rx.   Cataracts, bilateral    Chronic constipation 01/10/2015   Diastolic dysfunction    a. 09/2009 Echo: Ef 55%, no rwma, GrII DD, mild MR, mildly dil LA, nl RV fxn. PASP 25-80mmHg; b. 06/2022 Echo: EF 60%, GrII DD, nl RV fxn, mild AI.   HOH (hard of hearing)    Hyperlipemia    Hypertension    Osteoarthritis    PAF (paroxysmal atrial fibrillation) (HCC) 09/2009   a. CHA2DS2VASc = 4-->eliquis; b. Prev on flec->d/c 2021 after cath showed nonobs CAD; c. 07/2020 Recurrent AF->Amio-->[facility]). Amio d/c'd early 2022.   Presence of permanent cardiac pacemaker    Sick sinus syndrome (HCC)    a. 10/2009 s/p MDT Adapta ADDRL1 DC PPM; b. 04/2023 s/p Gen change   Sigmoid volvulus [facility])     Past Surgical History:  Procedure Laterality Date   CATARACT EXTRACTION W/PHACO Left 01/16/2017   Procedure: CATARACT EXTRACTION PHACO AND INTRAOCULAR LENS PLACEMENT (IOC);  Surgeon:  Juan Lange, MD;  Location: [facility] ORS;  Service: Ophthalmology;  Laterality: Left;  [place] 1:51.3 AP% 25.9 CDE 54.80 FLUID PACK LOT # [id number]   COLONOSCOPY WITH PROPOFOL N/A 09/15/2018   Procedure: COLONOSCOPY WITH PROPOFOL;  Surgeon: Juan Reil, MD;  Location: [facility] ENDOSCOPY;  Service: Gastroenterology;  Laterality: N/A;   dual chamber pacemaker  01/11   Dr. Orpah Chan SIGMOIDOSCOPY N/A 10/07/2018   Procedure: FLEXIBLE SIGMOIDOSCOPY;  Surgeon: Juan Spillers, MD;  Location: [facility] ENDOSCOPY;  Service: Endoscopy;  Laterality: N/A;   LAPAROSCOPIC CHOLECYSTECTOMY  1/14   Dr [name]   PARTIAL COLECTOMY N/A 10/13/2018   Procedure: LAPAROTOMY, LEFT COLECTOMY,;  Surgeon: Juan Linsey, MD;  Location: [facility] ORS;  Service: General;  Laterality: N/A;   VASECTOMY  in his 30's     Allergies  Allergies  Allergen Reactions   Augmentin [Amoxicillin-Pot Clavulanate] Rash   Colchicine Rash   Morphine And Codeine     Shaking, back pain.    History of Present Illness    89 y/o ? w/ a h/o CAD, HTN, HL, PAF, sinus node dysfxn, diast dysfxn, OA, and BPH.  He prev underwent MDT PPM placement in 10/2009 in the setting of sinus node dysfxn.  In the setting of PAF, he was managed w/ flecainide for many years, but this was subsequently d/c'd in 2021 after diagnostic cath revealed nonobs dzs.  He has been followed @ the [place] in [place].  In the Fall of 2023, he c/o chest pain and dyspnea/fatigue.  Zio showed 2% PVC burden, though symptoms did not correlate w/ arrhythmia.  Echo showed EF of 60% w/ GrII DD and no significant valvular dzs.  MV showed a small area of apical lateral ischemia.  [name] and he finally underwent cath in 01/2023 due to ongoing DOE and chest pain.  Cath showed severe LCX and OM1 dzs and a DES was placed @ each site.  He cont to c/o chest pain and dyspnea post-procedure and underwent relook cath 4 days later, which showed  patent stents and otw nonobs dzs.  In the setting of his pacer reaching ERI in 02/2023, he underwent gen change @ the [place] on 04/09/2023.  Despite fairly extensive cardiac evaluation dating back to the fall 2023, [name] has had progressive weakness and fatigue, as well as intermittent rest and exertional chest tightness.  He notes that last summer, he was still playing golf but over the past 8 to 10 months, what initially started as requiring a cane to steady himself, has more recently become requiring 2 person assist to get up out of a chair.  He was hopeful that stenting and or generator change would have changed the course of his symptoms however symptoms have only progressed since.  He has been staying at an assisted living facility over the past 4 months.  He experiences chest tightness once or twice a week, typically at rest, sometimes associated with dyspnea, typically lasting a few minutes but sometimes a few hours, and resolving spontaneously.  He has been working with physical therapy and notes that he is very tired after a session and has not seen any improvement in his strength.  He has recently noticed some lower extremity swelling in the setting of sitting for long periods of time.  He does not usually experience dyspnea on exertion and denies PND, orthopnea, dizziness, syncope, or early satiety.  [name] was seen in the ED on 8/20 - 21 following a witnessed fall w/o syncope, in the setting of leg weakness.  W/u was relatively unremarkable, though he was noted to have mild hsTrop elevation @ 52  55.  He was not having c/p or dyspnea and he was d/c'd back to his facility.  Unfortunately, he has remained markedly weak and was unable to get out of bed by himself on August 24.  After discussion with family, decision was made to bring him back to the emergency department for evaluation and potential placement to a skilled nursing facility.  On arrival to the emergency department, he was afebrile and  hemodynamically stable.  ECG w/ A pacing @ 65, 1st deg AVB, LAD, IVCD, inf infarct, poor R progression - no acute changes.  Labs notable for BNP elevated @ 1066.5. HsTrop elevated @ 43  45.  CT head w/o acute findings.  CXR w/ hyperinflation and minimal basilar scar vs atx.  Currently he feels well at rest.  He notes that he was not having any chest pain yesterday and denies any symptoms today.  Inpatient Medications     apixaban  5 mg Oral BID   atorvastatin  80 mg Oral Daily   clopidogrel  75 mg Oral Daily   diltiazem  120 mg Oral Daily   finasteride  5 mg Oral Daily   senna-docusate  1 tablet Oral BID   tamsulosin  0.4 mg Oral QHS    Family History    Family History  Problem Relation Age of Onset   Hypertension Mother    Alcohol abuse Father    Bladder Cancer Neg Hx    Prostate cancer Neg Hx    Kidney cancer Neg Hx    He indicated that his mother is deceased. He indicated that his father is deceased. He indicated that both of his sisters are alive. He indicated that his brother is alive. He indicated that the status of his neg hx is unknown.   Social History    Social History   Socioeconomic History   Marital status: Married    Spouse name: Not on file   Number of children: 3   Years of education: Not on file   Highest education level: Not on file  Occupational History   Occupation: Retired    Associate Professor: RETIRED    Comment: Animator  Tobacco Use   Smoking status: Former    Passive exposure: Past   Smokeless tobacco: Never   Tobacco comments:    In [employer], quit in 1960's  Vaping Use   Vaping status: Never Used  Substance and Sexual Activity   Alcohol use: No   Drug use: No   Sexual activity: Yes  Other Topics Concern   Not on file  Social History Narrative   2 Daughters, one son killed in MVA      Has living will   Wife, then daughter Selena Batten, is health care POA   Would accept resuscitation attempts but no prolonged life support.   No tube feeding if  cognitively unaware   Social Determinants of Health   Financial Resource Strain: Not on file  Food Insecurity: Not on file  Transportation Needs: Not on file  Physical Activity: Not on file  Stress: Not on file  Social Connections: Not on file  Intimate Partner Violence: Not on file     Review of Systems    General:  +++ Progressive weakness over the past 8 to 10 months.  No chills, fever, night sweats or weight changes.  Cardiovascular:  +++ Intermittent chest tightness a few times a week, typically at rest.  Sometimes associated with dyspnea.  He does not typically experience dyspnea on exertion however.  He has noted some lower extremity swelling.  He denies orthopnea, palpitations, paroxysmal nocturnal dyspnea. Dermatological: No rash, lesions/masses Respiratory: No cough, +++ dyspnea occasionally associated with chest discomfort. Urologic: No hematuria, dysuria Abdominal:   No nausea, vomiting, diarrhea, bright red blood per rectum, melena, or hematemesis.  Feels that he has a good appetite, though has lost 22 pounds over the past several months. Neurologic:  No visual changes, changes in mental status. All other systems reviewed and are otherwise negative except as noted above.  Physical Exam    Blood pressure 108/88, pulse 61, temperature 97.7 F (36.5 C), temperature source Oral, resp. rate 16, height 5\' 10"  (1.778 m), weight 76.4 kg, SpO2 98%.  General: Pleasant, NAD Psych: Normal affect. Neuro: Alert and oriented X 3. Moves all extremities spontaneously. HEENT: Normal  Neck: Supple without bruits or JVD. Lungs:  Resp regular and unlabored, CTA. Heart: RRR no s3, s4.  1/6 systolic murmur throughout. Abdomen: Soft, non-tender, non-distended, BS + x 4.  Extremities: No clubbing, cyanosis.  1+ bilateral ankle and pedal edema. DP/PT2+, Radials 2+ and equal bilaterally.  Labs    Cardiac Enzymes Recent Labs  Lab 05/28/23 2228 05/29/23 0154 06/01/23 1426 06/01/23 1955  TROPONINIHS 52* 55* 43* 45*     BNP    Component Value Date/Time   BNP 1,466.5 (H) 06/01/2023 1427   Lab Results  Component Value Date   WBC 7.1 06/02/2023   HGB 11.3 (L) 06/02/2023   HCT 33.2 (L) 06/02/2023   MCV 93.3 06/02/2023   PLT 224 06/02/2023    Recent Labs  Lab 06/01/23 1426 06/02/23 0511  NA 137 138  K 3.6 2.7*  CL 104 109  CO2 25 23  BUN 21 18  CREATININE 0.63 0.45*  CALCIUM 8.7* 7.3*  PROT 6.6  --   BILITOT 0.6  --   ALKPHOS 63  --   ALT 37  --   AST 35  --   GLUCOSE 116* 86   Lab Results  Component Value Date   CHOL 214 (H) 12/03/2016   HDL 57.40 12/03/2016   LDLCALC 146 (H) 12/03/2016   TRIG 53.0 12/03/2016     Radiology Studies    CT Head Wo Contrast  Result Date: 06/01/2023 CLINICAL DATA:  Neuro deficit, acute, stroke suspected. Patient reports progressive upper and lower extremity weakness over the last 2-3 weeks. Patient brought by family for progressive deterioration. EXAM: CT HEAD WITHOUT CONTRAST TECHNIQUE: Contiguous axial images were obtained from the base of the skull through the vertex without intravenous contrast. RADIATION DOSE REDUCTION: This exam was performed according to the departmental dose-optimization program which includes automated exposure control, adjustment of the mA and/or kV according to patient size and/or use of iterative reconstruction technique. COMPARISON:  None Available. FINDINGS: Brain: Mild atrophy and white matter changes are within normal limits for age. Deep brain nuclei are within normal limits. The ventricles are of normal size. No significant extraaxial fluid collection is present. The brainstem and cerebellum are within normal limits. Midline structures are within normal limits. Vascular: Atherosclerotic calcifications are present within the cavernous internal carotid arteries bilaterally. No hyperdense vessel is present. Skull: Calvarium is intact. No focal lytic or blastic lesions are present. No significant  extracranial soft tissue lesion is present. Sinuses/Orbits: The paranasal sinuses and mastoid air cells are clear. Bilateral lens replacements are noted. Globes and orbits are otherwise unremarkable. Other: IMPRESSION: 1. No acute or focal lesion to explain the patient's symptoms. 2. Normal CT appearance of the brain for age. Electronically Signed   By: Marin Roberts M.D.   On: 06/01/2023 16:57   DG Chest Port 1 View  Result Date: 06/01/2023 CLINICAL DATA:  Weakness EXAM: PORTABLE CHEST 1 VIEW COMPARISON:  X-ray 09/30/2019 FINDINGS: Hyperinflation. Normal cardiopericardial silhouette. Calcified aorta. Minimal linear changes at the bases, likely scar or atelectasis. No pneumothorax or edema. Left upper chest pacemaker with leads along the right side of the heart. Degenerative changes of the spine and shoulders. IMPRESSION: Hyperinflation.  Minimal basilar scar or atelectasis.  Pacemaker Electronically Signed   By: Karen Kays M.D.   On: 06/01/2023 15:16   DG Knee Complete 4 Views Left  Result Date: 05/28/2023 CLINICAL DATA:  Fall, left knee pain EXAM: LEFT KNEE - COMPLETE 4+ VIEW COMPARISON:  None Available. FINDINGS: No acute fracture or dislocation. There is tricompartmental degenerative arthritis, mild within the lateral and patellofemoral compartments and relatively severe within the medial compartment with resultant overall mild varus angulation of the left knee. No effusion. Mild degenerative enthesopathy involving the quadriceps tendon insertion upon the patella. IMPRESSION: 1. Tricompartmental degenerative arthritis, relatively severe within the medial compartment. Electronically Signed   By: Helyn Numbers M.D.   On:  05/28/2023 23:28   DG Knee Complete 4 Views Right  Result Date: 05/28/2023 CLINICAL DATA:  Right knee pain EXAM: RIGHT KNEE - COMPLETE 4+ VIEW COMPARISON:  None Available. FINDINGS: No acute fracture or dislocation. Moderate to severe tricompartmental degenerative arthritis,  most severe within the medial compartment, with resultant mild overall straightening. Small right knee effusion. Mild degenerative enthesopathy involving the insertion of the quadriceps and patellar tendons upon the patella. IMPRESSION: 1. Moderate to severe tricompartmental degenerative arthritis. Electronically Signed   By: Helyn Numbers M.D.   On: 05/28/2023 23:27    ECG & Cardiac Imaging    A pacing @ 65, 1st deg AVB, LAD, IVCD, inf infarct, poor R progression  - personally reviewed.  Assessment & Plan    1.  Demand ischemia/coronary artery disease: Patient with an 8 to 41-month history of progressive weakness and intermittent chest pressure.  Mildly abnormal Myoview last fall followed by diagnostic catheterization in April 2024 at the [facility], which showed significant circumflex and OM1 disease with drug-eluting stents placed in each vessel.  In the setting of ongoing symptoms, he had relook catheterization 4 days later, which showed stable anatomy and patent stents.  He has continued to have intermittent chest pressure, typically at rest, occurring a few times a week, lasting a few minutes typically, and resolving spontaneously.  He has also had progressive fatigue and lower extremity weakness to the point that he now requires 2 person assistance with ambulation.  He was seen in the emergency department 4 days ago due to witnessed fall and weakness I was noted at that time to have mild troponin elevation with flat trend of 52-55.  He was discharged home and in the setting of ongoing weakness, he presented back to the emergency department on August 24, where troponin was again mildly elevated with a flat trend of 43-45.  He was not having any chest pain yesterday denies chest pain today.  ECG without acute changes.  Cardiac enzymatic changes do not appear to be consistent with acute coronary syndrome.  Follow-up echo.  Continue statin and Plavix.  No aspirin in the setting of Eliquis at home.  Further  recommendations following echo.  2.  Acute congestive heart failure: Previously documented normal LV function.  Patient has been noticing an increase in lower extremity and pedal edema over the past several months.  He believes his appetite is good, though he has unintentionally lost 22 pounds over 4 to 6 months.  In the setting of presentation for ongoing weakness and fatigue, he was noted to have a BNP of 1466.5.  Chest imaging without any significant heart failure.  He received 1 dose of intravenous Lasix and has noted good output.  Still with ankle and pedal edema.  I note albumin is low at 2.9.  Follow-up echo.  3.  Weakness/fatigue/unintentional weight loss: As above, ongoing over the past 8 to 10 months despite cardiac stenting in April and generator change in July.  As noted, lab work shows a mild troponin elevation with flat trend over the past week, normocytic anemia, and hypoalbuminemia.  I will add a TSH to this morning's lab draw as this has not been checked since January.  Echo pending.  Additional evaluation per medicine team.  4.  Essential hypertension: Stable on calcium channel blocker therapy.  5.  Hyperlipidemia: On atorvastatin.  No lipids since 2018.  LFTs okay.  6.  Paroxysmal atrial fibrillation: Status post PVI at the [facility] and 2021.  Off of  amiodarone since 2022.  A paced with V sensing on ECG today.  Anticoagulated with Eliquis with stable H&H and function.  7.  Hypokalemia: Potassium 2.7 this morning after receiving Lasix on arrival.  Supplementation already ordered.  Risk Assessment/Risk Scores:     TIMI Risk Score for Unstable Angina or Non-ST Elevation MI:   The patient's TIMI risk score is 4, which indicates a 20% risk of all cause mortality, new or recurrent myocardial infarction or need for urgent revascularization in the next 14 days.  New York Heart Association (NYHA) Functional Class NYHA Class II  CHA2DS2-VASc Score = 5   This indicates a 7.2% annual  risk of stroke. The patient's score is based upon: CHF History: 1 HTN History: 1 Diabetes History: 0 Stroke History: 0 Vascular Disease History: 1 Age Score: 2 Gender Score: 0     Signed, Nicolasa Ducking, NP 06/02/2023, 11:18 AM  For questions or updates, please contact   Please consult [email or web address] for contact info under Cardiology/STEMI.  Patient seen and examined   I agree with findings as noted by Flavia Shipper above    Pt is an 89 yo with known CAD)s[ DES to LCx and OM in April 2024; repeat cath patent stents in 01/2023), PPM (s/p generator change in July 2024), PAF, Diastolic dysfucntion.   Follows at [place]  Up until 1 year ago he was playing golf.   Over year he has gotten progressively weaker reqwring more and more assist to get up  [facility] now getting PT.  No improvement   Fell on 8/20  The pt denies chest tightness now   has had intermitt at home  at rest   REsolves on own   Gets fatigued easliy    Has had some LE edema with sitting     On exam:  Pt a very thin 89 yo in NAD Neck  JVP is normal Lungs are CTA  Cardiac Exam   RRR  III/VI systolic murmur LSB Abd supple  No hepatomegaly Ext   trivial edema Musculoskel  Cachectic Neuro   Arm streangth good     Legs  unable to lift or hold off of bed   Otherwise exam deferred  Trop minimally elevated at 43, 45 ELG wotjpit acute changes     1  Troponin elevation  Hx of CAD with stents in April 2024    Pt has minimal flat trop elevation.   I am not convinced represents active ischemia   I do not think progressive fatigue an anginal equivalent     Would follow     Echo has been ordered    Keep on statin and Plavix  2  Hx of previous CHF   Echo ordered   3 HTN  Keep on same meds   4  Rhythm  S/p PPM   Also hx pAF   had PVI in 2021    In SR today   Keep on Eliquis  5  Weakness  This is the patient's biggest complaint   Weight is down 22 lbs in 1 year    He can't hold legs off of bed Alb is 2.9    Pt says he is  eating well, getting protein   Does not line up with exam ? Nutrition or neuro consult  Will continue to follow   Dietrich Pates <D

## 2023-06-02 NOTE — Progress Notes (Signed)
  PROGRESS NOTE    [name]  ZOX:[id number] DOB: 1933-03-05 DOA: 06/01/2023 PCP: Karie Schwalbe, MD  [id number]  LOS: 1 day   Brief hospital course:   Assessment & Plan: [name] is a 89 year old male with history of anxiety, depression, A-fib on Eliquis, hypertension, history of sick sinus syndrome status post pacemaker placement, CAD with recent stents who presented to the emergency department for chief concerns of generalized weakness, worsening at assisted living facility.    Generalized weakness weakness reportedly developed very quickly in just the last 6 months or so, when previously he was able to golf and walk up stairs, now he can't lift up his legs against gravity.  No muscle pain. Plan: --neuro consult for possible etiology in muscle or nerve dysfunction. --PT/OT  Chest pain, ACS ruled out CAD  --pt had DES to LCx and OM in April 2024 at [facility]; repeat cath patent stents in 01/2023  --trop 40's flat. --cardiology consulted --Echo --cont statin and plavix (not on ASA due to Eliquis)  Unintentional weight loss --pt reported eating well.  Had colonoscopy back in 2019 with polyps removed.  Had [facility] at [facility] with no report of malignancy. --obtain PSA    History of anemia due to vitamin B12 deficiency --Vit B12 771 currently  Sick sinus syndrome (HCC) Status post pacemaker  Atrial fibrillation (HCC) --cont home Cardizem --cont home Eliquis  Hyperlipemia --cont home statin  Hypokalemia --due to 1 dose of IV lasix 40 pt received on presentation --monitor and supplement PRN   DVT prophylaxis: WJ:[id number] Code Status: Full code  Family Communication: daughter updated on the speaker phone Level of care: Telemetry Cardiac Dispo:   The patient is from: home Anticipated d/c is to: to be determined Anticipated d/c date is: pending neuro workup and dispotion   Subjective and Interval History:  No chest pain today.  Pt mainly complained of  weakness of all 4 extremities that had rapid progression.   Objective: Vitals:   06/02/23 1200 06/02/23 1315 06/02/23 1349 06/02/23 1605  BP: (!) 102/59  (!) 112/59 (!) 102/52  Pulse: 72  (!) 59 60  Resp: 17   18  Temp:  98.2 F (36.8 C) 97.9 F (36.6 C) 97.9 F (36.6 C)  TempSrc:  Oral    SpO2: 97%  100% 96%  Weight:      Height:        Intake/Output Summary (Last 24 hours) at 06/02/2023 1632 Last data filed at 06/02/2023 1350 Gross per 24 hour  Intake 200 ml  Output 2570 ml  Net -2370 ml   Filed Weights   06/01/23 1421  Weight: 76.4 kg    Examination:   Constitutional: NAD, AAOx3 HEENT: conjunctivae and lids normal, EOMI CV: No cyanosis.   RESP: normal respiratory effort, on RA Extremities: No effusions, edema in BLE.  Pt unable to keep either leg elevated against gravity SKIN: warm, dry Psych: Normal mood and affect.  Appropriate judgement and reason   Data Reviewed: I have personally reviewed labs and imaging studies  Time spent: 50 minutes  Darlin Priestly, MD [facility] If 7PM-7AM, please contact night-coverage 06/02/2023, 4:32 PM

## 2023-06-02 NOTE — Consult Note (Signed)
Neurology Consultation Reason for Consult: Generalized weakness Requesting Physician: Darlin Priestly  CC: Falls and weakness  History is obtained from: Patient and chart review  HPI: [name] is a 89 y.o. male with a past medical history significant for atrial fibrillation on Eliquis, sick sinus syndrome s/p pacemaker (originally placed 13 years ago, replaced in June) hard of hearing, hypertension, hyperlipidemia, hemorrhoids, coronary artery disease s/p stents in April 2024 per patient, B12 deficiency on supplementation  Time course of events is little challenging to ascertain from patient as it does not quite match what is documented in the chart.  What does seem consistent is that in early 2024 he stopped being able to golf due to difficulty with his gait (but he notes that this may have started when he was around 89 years old with gradual progression).  Subsequently he notes he was no longer able to drive about a month ago and noticed a change in his voice at that time as well.  Overall he does feel his symptoms are worse at the end of the day but he has not had any dysphagia, double vision or shortness of breath.  He denies fevers or skin changes/rashes.  He denies any fasciculations.  He does feel that he has had progressive numbness.  He does endorse some squeezing chest pain at the level of his nipples that is intermittent and he reports happens approximately once a month or so, noticed more at night but not necessarily with any specific position he endorses both gradually progressive bilaterally symmetric symptoms as well as sequential weakness with 1 part of his body affected after another, but when asked to give a timeline in an open ended manner details are vague.  He feels all of his weakness has been attributed to cardiac issues but on review of the chart he has also been evaluated for abnormal weight loss by GI and was screened for neuropathy in January 2023, at which time he was already  complaining of bilateral hand neuralgia. 10/13/2021 ANA negative, A1c 5.5%, TSH 2.24, B12 776, folate 17.6, ESR 23.  He was referred to [facility] neurology but I do not see any notes that he was able to make an appointment.  Patient was quite surprised to hear that he was having symptoms as far back as 1.5 years ago and to me reported that he did not think his hand symptoms started until much more recently.  On further review as far back as 02/25/2020 there was note of his hands going numb (evaluation at that time revealed B12 deficiency)  When I asked if problem started suddenly with 1 part of his body he stated to me that he thinks it started with his left shoulder at first and denies any associated pain.  Again on review of the chart, left shoulder problems noted since 2010.  At that time it was noted he was complaining of numbness in the left lateral thigh as well at that time felt to be sciatica versus meralgia paresthetica. Right shoulder issues mentioned in April 2021 ("fine as long as he is taking chondroitin")  He does not have any bowel or bladder symptoms, denying any numbness, though he reports that since it takes him a while to get to the bathroom due to his weakness he does have incontinence (he estimates this as approximately once every 2 to 3 days)  He was evaluated for abnormal weight loss 02/26/2023 at which time it was noted that he had had stents placed in  his heart in April 2024 and was started on clopidogrel in addition to apixaban. Weight loss was estimated as 20 pounds since 12/2021 and he reported good appetite but jaw fatigue preventing him from eating tough foods and snacking reportedly going on for 6 months at the time.  He mentioned he had stopped golfing and doing outside work 6 months ago which is what he attributed to causing his muscle loss (to me he reports that he stopped doing these activities because of weakness).  Workup for iron deficiency anemia was planned with plan for EGD  or colonoscopy if these were negative (though caution due to age and comorbidities).  CT abdomen pelvis with IV contrast was also completed on 04/02/2023  He reports he is eating well and has good appetite.  However his dinner is untouched next to him on the bedside table, although he has consumed 80% of a [employer] milkshake  Weight  Date 145   02/26/2023  143.2   05/07/2023  156  06/21/2022 172  10/12/2018   ROS: All other review of systems was negative except as noted in the HPI.  Past Medical History:  Diagnosis Date   Acute diverticulitis 09/2014   [facility]   Allergic rhinitis    BPH (benign prostatic hyperplasia)    CAD (coronary artery disease)    a. 10/2013 MV: low risk; b. 03/2020 Cath: mild-mod nonobs dzs; c. 08/2022 MV: tiny, mildly severe apical lateral ischemia; d. 01/2023 Cath/PCI: LCX 80m (2.25x38 Onyx Frontier DES - dil up to 3mm), OM1 70 (2x15 Onyx Frontier DES); e. 01/2023 relook Cath: LM/LAD no signif dzs, D2 40, LCX/OM1 patent stents, RCA 10p/m/d-->Med Rx.   Cataracts, bilateral    Chronic constipation 01/10/2015   Diastolic dysfunction    a. 09/2009 Echo: Ef 55%, no rwma, GrII DD, mild MR, mildly dil LA, nl RV fxn. PASP 25-66mmHg; b. 06/2022 Echo: EF 60%, GrII DD, nl RV fxn, mild AI.   HOH (hard of hearing)    Hyperlipemia    Hypertension    Osteoarthritis    PAF (paroxysmal atrial fibrillation) (HCC) 09/2009   a. CHA2DS2VASc = 4-->eliquis; b. Prev on flec->d/c 2021 after cath showed nonobs CAD; c. 07/2020 Recurrent AF->Amio-->PVI [facility]). Amio d/c'd early 2022.   Presence of permanent cardiac pacemaker    Sick sinus syndrome (HCC)    a. 10/2009 s/p MDT Adapta ADDRL1 DC PPM; b. 04/2023 s/p Gen change   Sigmoid volvulus [facility])    Past Surgical History:  Procedure Laterality Date   CATARACT EXTRACTION W/PHACO Left 01/16/2017   Procedure: CATARACT EXTRACTION PHACO AND INTRAOCULAR LENS PLACEMENT (IOC);  Surgeon: Sallee Lange, MD;  Location: [facility] ORS;  Service:  Ophthalmology;  Laterality: Left;  [place] 1:51.3 AP% 25.9 CDE 54.80 FLUID PACK LOT # [id number]   COLONOSCOPY WITH PROPOFOL N/A 09/15/2018   Procedure: COLONOSCOPY WITH PROPOFOL;  Surgeon: Toney Reil, MD;  Location[facility] ENDOSCOPY;  Service: Gastroenterology;  Laterality: N/A;   dual chamber pacemaker  01/11   Dr. Orpah Melter SIGMOIDOSCOPY N/A 10/07/2018   Procedure: FLEXIBLE SIGMOIDOSCOPY;  Surgeon: Pasty Spillers, MD;  Location: [facility] ENDOSCOPY;  Service: Endoscopy;  Laterality: N/A;   LAPAROSCOPIC CHOLECYSTECTOMY  1/14   Dr Lemar Livings   PARTIAL COLECTOMY N/A 10/13/2018   Procedure: LAPAROTOMY, LEFT COLECTOMY,;  Surgeon: Ancil Linsey, MD;  Location: [facility] ORS;  Service: General;  Laterality: N/A;   VASECTOMY  in his 26's   Current Outpatient Medications  Medication Instructions   acetaminophen (TYLENOL)  650 mg, Oral, Every 6 hours PRN, Reported on 01/17/2016   ALPRAZolam (XANAX) 0.25 MG tablet TAKE 1 TABLET(0.25 MG) BY MOUTH TWICE DAILY AS NEEDED FOR ANXIETY   apixaban (ELIQUIS) 5 mg, Oral, 2 times daily   atorvastatin (LIPITOR) 80 mg, Oral, Daily   clopidogrel (PLAVIX) 75 MG tablet TAKE ONE TABLET BY MOUTH EVERY DAY FOR BLOOD CLOT PREVENTION FOLLOWING PCI BE SURE TO CHECK WITH YOUR DOCTOR ABOUT HOW LONG YOU SHOULD TAKE THIS MEDICINE   diltiazem (TIAZAC) 120 mg, Oral, Daily   finasteride (PROSCAR) 5 mg, Oral, Daily   Glucosamine 500 MG CAPS Daily   sennosides-docusate sodium (SENOKOT-S) 8.6-50 MG tablet 1 tablet, Oral, 2 times daily   tamsulosin (FLOMAX) 0.4 MG CAPS capsule TAKE ONE CAPSULE BY MOUTH EVERY DAY   vitamin B-12 (CYANOCOBALAMIN) 1,000 mcg, Daily     Family History  Problem Relation Age of Onset   Hypertension Mother    Alcohol abuse Father    Bladder Cancer Neg Hx    Prostate cancer Neg Hx    Kidney cancer Neg Hx     Social History:  reports that he has quit smoking. He has been exposed to tobacco smoke. He has never used smokeless tobacco. He reports  that he does not drink alcohol and does not use drugs.   Exam: Current vital signs: BP (!) 102/52 (BP Location: Left Arm)   Pulse 60   Temp 97.9 F (36.6 C)   Resp 18   Ht 5\' 10"  (1.778 m)   Wt 76.4 kg   SpO2 96%   BMI 24.17 kg/m  Vital signs in last 24 hours: Temp:  [97.7 F (36.5 C)-98.2 F (36.8 C)] 97.9 F (36.6 C) (08/25 1605) Pulse Rate:  [56-72] 60 (08/25 1605) Resp:  [12-21] 18 (08/25 1605) BP: (102-133)/(52-88) 102/52 (08/25 1605) SpO2:  [96 %-100 %] 96 % (08/25 1605)   Physical Exam  Constitutional: Appears cachectic Psych: Pleasant and cooperative Eyes: No scleral injection HENT: No oropharyngeal obstruction.  MSK: no major joint deformities, diffuse muscle wasting.  Cardiovascular: Normal rate and regular rhythm. Perfusing extremities well Respiratory: Effort normal, non-labored breathing GI: Soft.  No distension. There is no tenderness.  Skin: Warm dry and intact visible skin with scattered bruises.  Edema of the ankles bilaterally  Neuro: Mental Status: Patient is awake, alert, oriented to person, place, month, year, and situation. Patient is able to give a clear and coherent history, though inconsistent on time course of events compared to chart at times Mild difficulty with naming at times (difficulty naming lens and frame initially).  No neglect Cranial Nerves: II: Visual Fields are full. Pupils are equal, round, and reactive to light.   III,IV, VI: EOMI without ptosis or diploplia.  Subtle nystagmus felt to be and gaze V: Facial sensation is symmetric to light touch VII: Facial movement is symmetric.  VIII: hearing is hard of hearing at baseline X: Uvula elevates symmetrically XII: tongue is midline without fasciculations or atrophy.  Hoarse voice Motor: Diffuse muscle wasting throughout.  He is weaker proximally than distally with 1/5 deltoids bilaterally and 3/5 hip flexion bilaterally (able to raise briefly antigravity but cannot maintain it for  more than 1 to 2 seconds).  Additionally more distally he has an upper motor neuron pattern of weakness with extension affected more than flexion in the upper extremities and flexion affected more than extension in the lower extremities.  The left knee flexion is slightly stronger than right knee flexion Sensory: He  reports sensation is intact to temperature throughout.  Vibration and proprioception is impaired in all 4 extremities (vibration approximately 7 seconds in all 4 extremities, impaired proprioception in both the fingers and the toes) Deep Tendon Reflexes: 2+ and symmetric in the brachioradialis and patellae.  Plantars: Toes are downgoing bilaterally.  Cerebellar: Finger-to-nose intact bilaterally.  Heel-to-shin  Gait:  Deferred for safety given significant weakness   I have reviewed labs in epic and the results pertinent to this consultation are:  Basic Metabolic Panel: Recent Labs  Lab 05/28/23 2228 06/01/23 1426 06/02/23 0511  NA 134* 137 138  K 3.7 3.6 2.7*  CL 103 104 109  CO2 23 25 23   GLUCOSE 91 116* 86  BUN 20 21 18   CREATININE 0.61 0.63 0.45*  CALCIUM 8.5* 8.7* 7.3*  MG  --   --  1.3*   Total protein 6.6, albumin 2.9, gamma gap 3.7  CBC: Recent Labs  Lab 05/28/23 2228 06/01/23 1426 06/02/23 0511  WBC 5.1 5.0 7.1  HGB 11.4* 10.6* 11.3*  HCT 34.7* 31.2* 33.2*  MCV 94.0 93.1 93.3  PLT 186 221 224    Coagulation Studies: No results for input(s): "LABPROT", "INR" in the last 72 hours.   Lab Results  Component Value Date   HGBA1C 5.5 10/13/2021    Lab Results  Component Value Date   VITAMINB12 771 06/01/2023   Lab Results  Component Value Date   TSH 2.562 06/02/2023    I have reviewed the images obtained:  CT ab/pelvis 04/02/23 1. There is mild dilatation of mid abdominal small bowel loop  which is fluid-filled with scattered areas of pneumatosis new  involving this mid left abdominal small bowel loop. With  dilatation of the small bowel  loop a developing partial small  bowel obstruction cannot be excluded and may be on basis with  adhesions although other etiologies not excluded . No frank bowel  wall thickening or adjacent inflammatory stranding noted.  -Pneumatosis intestinalis is nonspecific and clinical correlation  would need to be made with the patient's overall presentation and  symptoms. No frank pneumoperitoneum. Encrypted teams message was  sent to referring provider PA Ankabrandt with findings at time of  the report.  Postsurgical changes of prior sigmoid colon resection with air  within the rectosigmoid again noted  2. New bilateral small pleural effusions with increased  subcutaneous fluid/generalized anasarca as well as haziness of  the mesentery noted which can be seen with volume overload with  cardiomegaly partial visualization pacing device.  Additionally "There is diffuse atherosclerotic calcification of the abdominal  aorta with prominence of the distal aorta measuring up to 2.6 cm  transverse by 2.2 cm AP with both calcified and uncalcified  thrombus in this location as well as an area of focal contrast  along the area of thrombus which may represent a small ulceration  on image 67 in the distal aorta prior to the aortic bifurcation.  The IMA is not visualized with certainty."  Impression: At this time I am most impressed by the patient's profound muscle wasting.  On CT abdomen pelvis in June there was already note of pleural effusions and anasarca suggesting we are underestimating his weight loss due to replacement with fluid, and certainly his low albumin puts him at risk for developing anasarca.  It seems that he was very functional at baseline but also the type of personality did not complain much and keep doing what he can do.  Based on extensive chart review  and weight loss trajectory I suspect this has been a very gradually progressive process overall -- likely more on the order of at least several  years rather than just the past 8 to 9 months  Notably his reflexes are preserved despite his large fiber neuropathy on examination.  This is concerning for concurrent upper motor neuron process and therefore I will also send myelopathy labs.  Will obtain CT imaging of his C-spine.  He does not have any spinal cord tenderness or bowel or bladder symptoms.  I feel that a process such as ALS is less likely in the absence of any significant fasciculations including no tongue fasciculations.  Unfortunately I suspect his pacemaker will not be compatible with MRI at all due to  Could consider a limb-girdle muscular dystrophy which requires outpatient specialty evaluation. 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  I doubt that this is a statin related myopathy/myositis however risk of holding the statin for a few weeks is outweighed by the benefit in case this is related to statin use  Recommendations: -CK added on and relatively elevated as above, aldolase pending -CT chest abdomen pelvis for malignancy screening -Neuropathy labs to include A1c, ANA, ESR, SPEP, UPEP, IFE, HIV, HepB/C (hepatitis panel), anti-Mag -Myelopathy labs: zinc, copper, Vitamin E -Could consider biopsy of biceps (ideally muscle should have at least 4/5 strength for diagnostic clarity) -Hold statin for now pending anti-HMG CoA and anti-SRP antibody results -If MRI can be completed would complete MRI cervical spine, thoracic spine and lumbar spine with and without contrast (would need transfer to [facility] but would not transfer without confirmation that scanning would be  possible) -CT C-spine, T-spine and L-spine ordered for now -Outpatient referral to [facility] or Nita Sickle at [facility] neurology (not yet placed) -Outpatient EMG/nerve conduction study -Dr. Selina Cooley will follow-up initial labs tomorrow, much of this workup will take many days to result and will need to be followed up outpatient  Brooke Dare MD-PhD [facility] [phone] Available 7 AM to 7 PM, outside these hours please contact Neurologist on call listed on AMION

## 2023-06-02 NOTE — ED Notes (Signed)
Advised nurse that patient has ready bed 

## 2023-06-02 NOTE — ED Notes (Signed)
Pt's family asking to speak to admitting MD about Dx and next steps. Dr. Fran Lowes made aware

## 2023-06-02 NOTE — ED Notes (Signed)
Pt was cleaned up, bed changed new brief placed on pt

## 2023-06-03 ENCOUNTER — Encounter: Payer: Self-pay | Admitting: *Deleted

## 2023-06-03 ENCOUNTER — Inpatient Hospital Stay: Payer: No Typology Code available for payment source

## 2023-06-03 DIAGNOSIS — R0602 Shortness of breath: Secondary | ICD-10-CM | POA: Diagnosis not present

## 2023-06-03 LAB — FOLATE: Folate: 6.1 ng/mL (ref >5.9)

## 2023-06-03 LAB — VITAMIN D 25 HYDROXY (VIT D DEFICIENCY, FRACTURES): Vit D, 25-Hydroxy: 45.61 ng/mL (ref 30–100)

## 2023-06-03 LAB — BASIC METABOLIC PANEL
Anion gap: 7 (ref 5–15)
BUN: 19 mg/dL (ref 8–23)
CO2: 25 mmol/L (ref 22–32)
Calcium: 8.3 mg/dL — ABNORMAL LOW (ref 8.9–10.3)
Chloride: 104 mmol/L (ref 98–111)
Creatinine, Ser: 0.55 mg/dL — ABNORMAL LOW (ref 0.61–1.24)
GFR, Estimated: >60 mL/min (ref >60)
Glucose, Bld: 103 mg/dL — ABNORMAL HIGH (ref 70–99)
Potassium: 4 mmol/L (ref 3.5–5.1)
Sodium: 136 mmol/L (ref 135–145)

## 2023-06-03 LAB — IRON AND TIBC
Iron: 28 ug/dL — ABNORMAL LOW (ref 45–182)
Saturation Ratios: 16 % — ABNORMAL LOW (ref 17.9–39.5)
TIBC: 172 ug/dL — ABNORMAL LOW (ref 250–450)
UIBC: 144 ug/dL

## 2023-06-03 LAB — CK: Total CK: 137 U/L (ref 49–397)

## 2023-06-03 LAB — C-REACTIVE PROTEIN: CRP: 0.6 mg/dL (ref <1.0)

## 2023-06-03 LAB — SEDIMENTATION RATE: Sed Rate: 20 mm/h (ref 0–20)

## 2023-06-03 LAB — HEMOGLOBIN A1C
Hgb A1c MFr Bld: 5.1 % (ref 4.8–5.6)
Mean Plasma Glucose: 99.67 mg/dL

## 2023-06-03 LAB — PHOSPHORUS: Phosphorus: 3 mg/dL (ref 2.5–4.6)

## 2023-06-03 LAB — RPR: RPR Ser Ql: NONREACTIVE

## 2023-06-03 LAB — HIV ANTIBODY (ROUTINE TESTING W REFLEX): HIV Screen 4th Generation wRfx: NONREACTIVE

## 2023-06-03 LAB — MAGNESIUM: Magnesium: 1.7 mg/dL (ref 1.7–2.4)

## 2023-06-03 MED ORDER — IOHEXOL 300 MG/ML  SOLN
100.0000 mL | Freq: Once | INTRAMUSCULAR | Status: AC | PRN
  Administered 2023-06-03: 100 mL via INTRAVENOUS

## 2023-06-03 MED ORDER — POLYSACCHARIDE IRON COMPLEX 150 MG PO CAPS: 150.0000 mg | ORAL_CAPSULE | Freq: Every day | ORAL | Status: DC

## 2023-06-03 MED ORDER — SODIUM CHLORIDE 0.9 % IV SOLN
200.0000 mg | Freq: Every day | INTRAVENOUS | Status: AC
  Administered 2023-06-03 – 2023-06-07 (×5): 200 mg via INTRAVENOUS
  Filled 2023-06-03 (×5): qty 200

## 2023-06-03 MED ORDER — FOLIC ACID 1 MG PO TABS
1.0000 mg | ORAL_TABLET | Freq: Every day | ORAL | Status: DC
  Administered 2023-06-03 – 2023-06-07 (×5): 1 mg via ORAL
  Filled 2023-06-03 (×5): qty 1

## 2023-06-03 NOTE — Progress Notes (Signed)
Initiated urine collection at 0250am.

## 2023-06-03 NOTE — Evaluation (Signed)
Occupational Therapy Evaluation Patient Details Name: [name] MRN: [id number] DOB: 09-Jan-1933 Today's Date: 06/03/2023   History of Present Illness Patient is a 89 year old male with history of anxiety, depression, A-fib on Eliquis, hypertension, hyperlipidemia, history of sick sinus syndrome status post pacemaker placement, history of B12 deficiency, who presents to the emergency department for chief concerns of generalized weakness, worsening at assisted living facility. Current MD assessment includes: Generalized weakness, chest pain, a-fib, HLD, and hypokalemia.   Clinical Impression   Patient received for OT evaluation. See flowsheet below for details of function. Generally, patient requiring SBA with HOB raised and use of rails and extra time for bed mobility, supervision for scooting along EOB, and MIN-MAX A overall for ADLs. Significant shoulder deficits (see below). Patient will benefit from continued OT while in acute care.       If plan is discharge home, recommend the following: A lot of help with walking and/or transfers;A lot of help with bathing/dressing/bathroom;Assistance with cooking/housework;Assistance with feeding;Direct supervision/assist for medications management;Direct supervision/assist for financial management;Assist for transportation;Help with stairs or ramp for entrance    Functional Status Assessment  Patient has had a recent decline in their functional status and demonstrates the ability to make significant improvements in function in a reasonable and predictable amount of time.  Equipment Recommendations  Other (comment) (defer to next venue of care)    Recommendations for Other Services       Precautions / Restrictions Precautions Precautions: Fall Required Braces or Orthoses: Other Brace Other Brace: Pt has BIL wrist braces on bedside; states he wears them at night for carpal tunnel pain control Restrictions Weight Bearing Restrictions: No       Mobility Bed Mobility Overal bed mobility: Needs Assistance Bed Mobility: Supine to Sit     Supine to sit: Used rails, HOB elevated, Contact guard     General bed mobility comments: extra time    Transfers                   General transfer comment: Did not attempt to t/f to standing today. Pt was able to push through BIL UE to scoot along EOB without OT assist.      Balance Overall balance assessment: Needs assistance Sitting-balance support: Feet supported, No upper extremity supported Sitting balance-Leahy Scale: Good                                     ADL either performed or assessed with clinical judgement   ADL Overall ADL's : Needs assistance/impaired Eating/Feeding:  (pt not eating for procedure) Eating/Feeding Details (indicate cue type and reason): Due to pt's proximal weakness, will need set up and assistance with propping elbows on table for eating positioning; RN and NT notified Grooming: Set up;Supervision/safety;Sitting Grooming Details (indicate cue type and reason): seated EOB, pt needed assistance to open toothpaste container; needed assist for propping R elbow onto tray table in order to facilitate proper positioning for brushing teeth (otherwise was having to flex neck very far forward to reach his toothbrush head). Once set up, pt able to complete task, including picking up cup for rinsing and spitting, without OT assistance.   Upper Body Bathing Details (indicate cue type and reason): anticipate MOD A due to weakness   Lower Body Bathing Details (indicate cue type and reason): anticipate MAX A due to weakness   Upper Body Dressing Details (indicate cue type and  reason): anticipate MOD A due to weakness   Lower Body Dressing Details (indicate cue type and reason): anticipate dependent due to weakness   Toilet Transfer Details (indicate cue type and reason): not attempted today   Toileting - Clothing Manipulation Details  (indicate cue type and reason): attempt MAX A due to weakness       General ADL Comments: Pt participated in seated ADLs today     Vision Baseline Vision/History: 1 Wears glasses (for reading) Patient Visual Report: No change from baseline       Perception         Praxis         Pertinent Vitals/Pain Pain Assessment Pain Assessment: No/denies pain     Extremity/Trunk Assessment Upper Extremity Assessment Upper Extremity Assessment: RUE deficits/detail;LUE deficits/detail;Right hand dominant RUE Deficits / Details: Pt's UE deficits appear to be approximately same bilaterally. Trace shoulder movements for flexion and abduction; is able to shrug his shoulders. Distally, he is able to hold forearms against significant resistance, as well as flex elbows to bring hand to mouth (although without elbows propped, he does have to lean forward at the neck to get his hand to reach his mouth). BIL hand strength WNL for age; slight weakness from baseline per pt report. Pt reports BIL hands at this current moment have no sensation deficits, but reports intermittent numbness/tingling the past few months (medical record states this goes back over a year). Weakness is significantly more pronounced proximally than distally. RN reports that pt told her earlier he has BIL rotator cuff injuries; pt did not mention this to OT.   Lower Extremity Assessment Lower Extremity Assessment: Defer to PT evaluation (Pt able to get)   Cervical / Trunk Assessment Cervical / Trunk Assessment:  (Pt reporting localized pain when sitting up at the base of the neck along spinal cord)   Communication Communication Communication: No apparent difficulties   Cognition Arousal: Alert Behavior During Therapy: WFL for tasks assessed/performed Overall Cognitive Status: Within Functional Limits for tasks assessed                                 General Comments: Pt is generally oriented; does have difficulty  providing some specific details of his situation. Very pleasant and motivated.     General Comments  On room air.    Exercises     Shoulder Instructions      Home Living Family/patient expects to be discharged to:: Assisted living Living Arrangements: Spouse/significant other Available Help at Discharge: Family;Neighbor;Available PRN/intermittently Type of Home: House Home Access: Stairs to enter [employer] of Steps: 3 Entrance Stairs-Rails: Right;Left;Can reach both Home Layout: Able to live on main level with bedroom/bathroom     Bathroom Shower/Tub: Producer, television/film/video: Standard Bathroom Accessibility: Yes   Home Equipment: Hand held Programmer, systems (2 wheels);Wheelchair - manual;Shower seat;Grab bars - toilet   Additional Comments: Pt states he has been at [facility] for "a few weeks"; wife lives at home; grandson lives a few yards away also on the property.      Prior Functioning/Environment Prior Level of Function : Needs assist             Mobility Comments: Pt reports walking, playing golf, community ambulator roughly 6 months ago. Past 3 weeks pt has been receiving PT at [facility]; typically can ambulate, but lately has been needing assist of staff with  RW or w/c to get to bathroom. States that he was d/c from PT because he kept getting weaker. Pt endorses 2 recent falls ADLs Comments: Pt states he is typically (I) BADLs, but needing lots of assist in the past few weeks 2/2 increasing weakness.        OT Problem List: Decreased strength;Decreased range of motion;Decreased activity tolerance      OT Treatment/Interventions: Self-care/ADL training;Therapeutic exercise;DME and/or AE instruction;Therapeutic activities;Patient/family education    OT Goals(Current goals can be found in the care plan section) Acute Rehab OT Goals Patient Stated Goal: Get stronger OT Goal Formulation: With patient Time For Goal  Achievement: 06/17/23 Potential to Achieve Goals: Fair ADL Goals Pt Will Perform Grooming: with modified independence;sitting Pt Will Perform Upper Body Dressing: with modified independence;sitting Pt Will Transfer to Toilet: with supervision;bedside commode;ambulating Pt Will Perform Toileting - Clothing Manipulation and hygiene: with supervision;sit to/from stand  OT Frequency: Min 1X/week    Co-evaluation              AM-PAC OT "6 Clicks" Daily Activity     Outcome Measure Help from another person eating meals?: A Little Help from another person taking care of personal grooming?: A Little Help from another person toileting, which includes using toliet, bedpan, or urinal?: A Lot Help from another person bathing (including washing, rinsing, drying)?: A Lot Help from another person to put on and taking off regular upper body clothing?: A Lot Help from another person to put on and taking off regular lower body clothing?: Total 6 Click Score: 13   End of Session Nurse Communication: Mobility status;Other (comment) (need for set up/assist for grooming/eating)  Activity Tolerance: Patient tolerated treatment well Patient left: in bed;with call bell/phone within reach;with bed alarm set  OT Visit Diagnosis: Muscle weakness (generalized) (M62.81);History of falling (Z91.81)                Time: 1478-2956 OT Time Calculation (min): 24 min Charges:  OT General Charges $OT Visit: 1 Visit OT Evaluation $OT Eval Moderate Complexity: 1 Mod OT Treatments $Self Care/Home Management : 8-22 mins  Linward Foster, MS, OTR/L  Alvester Morin 06/03/2023, 12:23 PM

## 2023-06-03 NOTE — Telephone Encounter (Signed)
Pt is currently admitted since 8/24. Will follow up with pt/daughter after discharge to discuss plan.

## 2023-06-03 NOTE — Plan of Care (Signed)
  Problem: Clinical Measurements: Goal: Respiratory complications will improve Outcome: Progressing Goal: Cardiovascular complication will be avoided Outcome: Progressing   Problem: Activity: Goal: Risk for activity intolerance will decrease Outcome: Progressing   Problem: Nutrition: Goal: Adequate nutrition will be maintained Outcome: Progressing   

## 2023-06-03 NOTE — Progress Notes (Signed)
Patient given ct contrast to drink.  1 bottle per hour.

## 2023-06-03 NOTE — Plan of Care (Signed)
CT c/t/l, CT abd/pelvis, and majority of neuro labs ordered this AM remain pending (CK was normal). Will f/u tomorrow when at least some of these results are available.  Bing Neighbors, MD [facility] [phone]  If 7pm- 7am, please page neurology on call as listed in [facility]ION.

## 2023-06-03 NOTE — Evaluation (Signed)
Physical Therapy Evaluation Patient Details Name: [name] MRN: [id number] DOB: 1932-12-06 Today's Date: 06/03/2023  History of Present Illness  Patient is a 89 year old male with history of anxiety, depression, A-fib on Eliquis, hypertension, hyperlipidemia, history of sick sinus syndrome status post pacemaker placement, history of B12 deficiency, who presents to the emergency department for chief concerns of generalized weakness, worsening at assisted living facility. Current MD assessment includes: Generalized weakness, chest pain, a-fib, HLD, and hypokalemia.  Clinical Impression  Pt was pleasant and motivated to participate during the session and put forth good effort throughout. Pt is currently requiring Min A for bed mobility and transfers; can perform STS with RW from elevated EOB, pt has forward flexed posture that he can temporarily correct with VC's. Pt unable to lift legs on first STS, but during second attempt pt able to side shuffle a few feet to [facility] with VC's for sequencing and AD management. Through conversation with pt, he mentions being bitten by a tick ~6 months ago around the same time be began getting gradually weaker; notified MD and nursing. Pt will benefit from continued PT services upon discharge to safely address deficits listed in patient problem list for decreased caregiver assistance and eventual return to PLOF.       If plan is discharge home, recommend the following: A little help with walking and/or transfers;A little help with bathing/dressing/bathroom;Assistance with cooking/housework;Assist for transportation;Help with stairs or ramp for entrance   Can travel by private vehicle   No    Equipment Recommendations None recommended by PT  Recommendations for Other Services       Functional Status Assessment Patient has had a recent decline in their functional status and demonstrates the ability to make significant improvements in function in a reasonable and  predictable amount of time.     Precautions / Restrictions Precautions Precautions: Fall Restrictions Weight Bearing Restrictions: No      Mobility  Bed Mobility Overal bed mobility: Needs Assistance Bed Mobility: Supine to Sit     Supine to sit: Min assist     General bed mobility comments: Min A with HOB elevated, partial log roll technique    Transfers Overall transfer level: Needs assistance Equipment used: Rolling walker (2 wheels) Transfers: Sit to/from Stand Sit to Stand: Min assist, From elevated surface           General transfer comment: Cues for hand placement, pt only able to stay standing for ~30 seconds at a time. On first STS pt reports unable to lift legs up; but upon second stand pt able to weight shift to shuffle feet.    Ambulation/Gait Ambulation/Gait assistance: Contact guard assist Gait Distance (Feet): 4 Feet Assistive device: Rolling walker (2 wheels) Gait Pattern/deviations: Decreased step length - right, Decreased step length - left, Decreased stride length, Step-through pattern Gait velocity: decreased     General Gait Details: Slowed, forward flexed posture, cues to stand upright. Able to take few shuffling side steps towards [facility] with cues upon second attempt at standing.  Stairs            Wheelchair Mobility     Tilt Bed    Modified Rankin (Stroke Patients Only)       Balance Overall balance assessment: Needs assistance Sitting-balance support: Feet supported, No upper extremity supported Sitting balance-Leahy Scale: Fair       Standing balance-Leahy Scale: Poor Standing balance comment: Static standing at [employer]  Pertinent Vitals/Pain Pain Assessment Pain Assessment: No/denies pain    Home Living Family/patient expects to be discharged to:: Assisted living Living Arrangements: Spouse/significant other Available Help at Discharge: Family;Neighbor;Available  PRN/intermittently Type of Home: House Home Access: Stairs to enter Entrance Stairs-Rails: Right;Left;Can reach both Entrance Stairs-Number of Steps: 3   Home Layout: Able to live on main level with bedroom/bathroom Home Equipment: Hand held shower head;Rolling Walker (2 wheels);Wheelchair - manual;Shower seat;Grab bars - toilet Additional Comments: Pt states he has been at [facility] for "a few weeks"; wife lives at home; grandson lives a few yards away also on the property.    Prior Function Prior Level of Function : Needs assist             Mobility Comments: Pt reports walking, playing golf, community ambulator roughly 6 months ago. Past 3 weeks pt has been receiving PT at [facility]; typically can ambulate, but lately has been needing assist of staff with RW or w/c to get to bathroom. States that he was d/c from PT because he kept getting weaker. ADLs Comments: Pt states he is typically (I) BADLs, but needing lots of assist in the past few weeks 2/2 increasing weakness.     Extremity/Trunk Assessment   Upper Extremity Assessment Upper Extremity Assessment: RUE deficits/detail;LUE deficits/detail;Right hand dominant RUE Deficits / Details: Pt's UE deficits appear to be approximately same bilaterally. Trace shoulder movements for flexion and abduction; is able to shrug his shoulders. Distally, he is able to hold forearms against significant resistance, as well as flex elbows to bring hand to mouth (although without elbows propped, he does have to lean forward at the neck to get his hand to reach his mouth). BIL hand strength WNL for age; slight weakness from baseline per pt report. Pt reports BIL hands at this current moment have no sensation deficits, but reports intermittent numbness/tingling the past few months (medical record states this goes back over a year). Weakness is significantly more pronounced proximally than distally.    Lower Extremity Assessment Lower Extremity  Assessment: Defer to PT evaluation (Pt able to get)    Cervical / Trunk Assessment Cervical / Trunk Assessment:  (Pt reporting localized pain when sitting up at the base of the neck along spinal cord)  Communication   Communication Communication: No apparent difficulties  Cognition Arousal: Alert Behavior During Therapy: WFL for tasks assessed/performed Overall Cognitive Status: Within Functional Limits for tasks assessed                                          General Comments      Exercises Total Joint Exercises Ankle Circles/Pumps: 10 reps, Both, Strengthening, AROM Hip ABduction/ADduction: AROM, 10 reps, Both, Strengthening Marching in Standing: Seated, AROM, 10 reps, Both Other Exercises Other Exercises: Educated pt on benefits of frequent LE and UE exercises throughout the day to pt's tolerance in order to maintain functional muscle activity   Assessment/Plan    PT Assessment Patient needs continued PT services  PT Problem List Decreased strength;Decreased range of motion;Decreased activity tolerance;Decreased balance;Decreased mobility;Decreased coordination       PT Treatment Interventions      PT Goals (Current goals can be found in the Care Plan section)  Acute Rehab PT Goals Patient Stated Goal: walk more PT Goal Formulation: With patient Time For Goal Achievement: 06/16/23 Potential to Achieve Goals: Fair  Frequency Min 1X/week     Co-evaluation               AM-PAC PT "6 Clicks" Mobility  Outcome Measure Help needed turning from your back to your side while in a flat bed without using bedrails?: A Little Help needed moving from lying on your back to sitting on the side of a flat bed without using bedrails?: A Little Help needed moving to and from a bed to a chair (including a wheelchair)?: A Little Help needed standing up from a chair using your arms (e.g., wheelchair or bedside chair)?: A Little Help needed to walk in  hospital room?: A Lot Help needed climbing 3-5 steps with a railing? : A Lot 6 Click Score: 16    End of Session Equipment Utilized During Treatment: Gait belt Activity Tolerance: Patient tolerated treatment well Patient left: in bed;with bed alarm set;with call bell/phone within reach Nurse Communication: Mobility status PT Visit Diagnosis: Unsteadiness on feet (R26.81);Muscle weakness (generalized) (M62.81);Difficulty in walking, not elsewhere classified (R26.2)    Time: 2956-2130 PT Time Calculation (min) (ACUTE ONLY): 35 min   Charges:                 Cecile Sheerer, SPT 06/03/23, 1:21 PM

## 2023-06-03 NOTE — Progress Notes (Signed)
Progress Note  Patient Name: [name] Date of Encounter: 06/03/2023  Primary Cardiologist: [facility]  Subjective   No chest pain, or symptoms of cardiac decompensation. Fatigued after working with PT. Notes a significant amount of muscle wasting.   Inpatient Medications    Scheduled Meds:  apixaban  5 mg Oral BID   clopidogrel  75 mg Oral Daily   diltiazem  120 mg Oral Daily   finasteride  5 mg Oral Daily   senna-docusate  1 tablet Oral BID   tamsulosin  0.4 mg Oral QHS   Continuous Infusions:  PRN Meds: acetaminophen **OR** acetaminophen, ondansetron **OR** ondansetron (ZOFRAN) IV, senna-docusate   Vital Signs    Vitals:   06/02/23 1948 06/02/23 2321 06/03/23 0332 06/03/23 0738  BP: (!) 112/57 (!) 115/59 109/61 (!) 107/59  Pulse: 62 62 60 (!) 59  Resp:  19 18 20   Temp: 98.3 F (36.8 C) 97.9 F (36.6 C) 97.9 F (36.6 C) 98.3 F (36.8 C)  TempSrc: Oral     SpO2: 98% 97% 100% 98%  Weight:      Height:        Intake/Output Summary (Last 24 hours) at 06/03/2023 0951 Last data filed at 06/03/2023 7425 Gross per 24 hour  Intake 250 ml  Output 625 ml  Net -375 ml   Filed Weights   06/01/23 1421  Weight: 76.4 kg    Telemetry    AV paced rhythm - Personally Reviewed  ECG    No new tracings - Personally Reviewed  Physical Exam   GEN: No acute distress, frail appearing.  Neck: No JVD. Cardiac: RRR, I/VI systolic murmur, no rubs, or gallops.  Respiratory: Clear to auscultation bilaterally.  GI: Soft, nontender, non-distended.   MS: No edema; No deformity. Neuro:  Alert and oriented x 3; Nonfocal.  Psych: Normal affect.  Labs    Chemistry Recent Labs  Lab 06/01/23 1426 06/02/23 0511 06/03/23 0550  NA 137 138 136  K 3.6 2.7* 4.0  CL 104 109 104  CO2 25 23 25   GLUCOSE 116* 86 103*  BUN 21 18 19   CREATININE 0.63 0.45* 0.55*  CALCIUM 8.7* 7.3* 8.3*  PROT 6.6  --   --   ALBUMIN 2.9*  --   --   AST 35  --   --   ALT 37  --   --   ALKPHOS 63   --   --   BILITOT 0.6  --   --   GFRNONAA >60 >60 >60  ANIONGAP 8 6 7      Hematology Recent Labs  Lab 05/28/23 2228 06/01/23 1426 06/02/23 0511  WBC 5.1 5.0 7.1  RBC 3.69* 3.35* 3.56*  HGB 11.4* 10.6* 11.3*  HCT 34.7* 31.2* 33.2*  MCV 94.0 93.1 93.3  MCH 30.9 31.6 31.7  MCHC 32.9 34.0 34.0  RDW 14.9 14.9 14.7  PLT 186 221 224    Cardiac EnzymesNo results for input(s): "TROPONINI" in the last 168 hours. No results for input(s): "TROPIPOC" in the last 168 hours.   BNP Recent Labs  Lab 06/01/23 1427  BNP 1,466.5*     DDimer No results for input(s): "DDIMER" in the last 168 hours.   Radiology    CT Head Wo Contrast  Result Date: 06/01/2023 IMPRESSION: 1. No acute or focal lesion to explain the patient's symptoms. 2. Normal CT appearance of the brain for age. Electronically Signed   By: Marin Roberts M.D.   On: 06/01/2023 16:57  DG Chest Port 1 View  Result Date: 06/01/2023 IMPRESSION: Hyperinflation.  Minimal basilar scar or atelectasis.  Pacemaker Electronically Signed   By: Karen Kays M.D.   On: 06/01/2023 15:16    Cardiac Studies   2D echo 06/02/2023: 1. Left ventricular ejection fraction, by estimation, is 60 to 65%. The  left ventricle has normal function. The left ventricle has no regional  wall motion abnormalities. There is moderate left ventricular hypertrophy.  Left ventricular diastolic  parameters are indeterminate.   2. Right ventricular systolic function is normal. The right ventricular  size is normal.   3. Mild mitral valve regurgitation.   4. The aortic valve is tricuspid. Aortic valve regurgitation is mild.  Aortic valve sclerosis/calcification is present, without any evidence of  aortic stenosis.   5. Pulmonic valve regurgitation is moderate.   Patient Profile     89 y.o. male with history of CAD s/p recent PCI to the LCx and OM1 in 01/2023 through the [place] [facility], PAF previously on flecainide s/p PVI in 2021, diastolic  dysfunction, HTN, HLD, sinus node dysfunction s/p PPM in 2011, OA, and BPH, who is being seen today for the evaluation of unstable angina and mild troponin elevation at the request of Dr. Fran Lowes.   Assessment & Plan    1. CAD involving the native coronary arteries with demand ischemia: Patient with an 8 to 54-month history of progressive weakness and intermittent chest pressure. Mildly abnormal Myoview last fall followed by diagnostic catheterization in April 2024 at the [facility], which showed significant circumflex and OM1 disease with drug-eluting stents placed in each vessel. In the setting of ongoing symptoms, he had relook catheterization 4 days later, which showed stable anatomy and patent stents. He has continued to have intermittent chest pressure, typically at rest, occurring a few times a week, lasting a few minutes typically, and resolving spontaneously. He has also had progressive fatigue and lower extremity weakness to the point that he now requires 2 person assistance with ambulation. He was seen in the emergency department 4 days ago due to witnessed fall and weakness, it was noted at that time to have mild troponin elevation with flat trend of 52-55.  He was discharged home and in the setting of ongoing weakness, he presented back to the emergency department on August 24, where troponin was again mildly elevated with a flat trend of 43-45.  -Currently, without symptoms of angina or cardiac decompensation. -Mildly elevated and flat trending troponin, not consistent with ACS. -Suspect demand ischemia. -Has been continued on Eliquis this admission.  -PTA Plavix.  2. PAF: -AV paced rhythm -Status post PVI in 2021 -CHADS2VASc 4 (HTN, age x 2, vascular disease) -Has been continued on Eliquis this admission, does not meet reduced dosing criteria.   3. Weakness/fatigue/unintentional weight loss/lower extremity swelling/muscle wasting: -BNP 1466, chest imaging without significant heart  failure -Noted to be significantly hypoalbuminemic  -Echo this admission with preserved LVSF with indeterminate diastolic function. -Further evaluation per IM/neurology.   3. Sinus node dysfunction: -Status post generator change out in 04/2023. -Device functioning normally.  4. HLD: -PTA statin held for now per neuro recs.       For questions or updates, please contact [facility] Please consult [email or web address] for contact info under Cardiology/STEMI.    Signed, Eula Listen, PA-C [facility] Pager: [phone] 06/03/2023, 9:51 AM

## 2023-06-03 NOTE — Consult Note (Signed)
[facility] Customer service manager [facility]) Accountable Care Organization (ACO) [facility] Liaison Note  06/03/2023  [name] [birth year] [id number]  Location: [facility] RN Hospital Liaison screened the patient remotely at [facility].  Insurance:  Social research officer, government Administration   [name] is a 89 y.o. male who is a Primary Care Patient of Tillman Abide I, MD. The patient was screened for  day readmission hospitalization with noted low risk score for unplanned readmission risk with 1 IP/2 ED in 6 months.  The patient was assessed for potential [facility]) Care Management service needs for post hospital transition for care coordination. Review of patient's electronic medical record reveals patient was admitted for General Weakness. Notes indicated pt utilizes the [place] for his ongoing benefits and pt is from a ALF for his residence. No anticipated needs at this time.     [facility] Care Management/Population Health does not replace or interfere with any arrangements made by the Inpatient Transition of Care team.   For questions contact:   Elliot Cousin, RN, [facility] Liaison [place]   Population Health Office Hours MTWF  8:00 am-6:00 pm [phone] mobile [phone] [Office toll free line] Office Hours are M-F 8:30 - 5 pm [email or web address]

## 2023-06-03 NOTE — Plan of Care (Signed)
°  Problem: Clinical Measurements: °Goal: Respiratory complications will improve °Outcome: Progressing °Goal: Cardiovascular complication will be avoided °Outcome: Progressing °  °Problem: Activity: °Goal: Risk for activity intolerance will decrease °Outcome: Progressing °  °

## 2023-06-03 NOTE — Progress Notes (Signed)
Patient is complaining of feet numbness. Patient has no loss of sensory and is able to move his feet and toes. I advised patient to start drinking contrast to get CT performed. I explained that the CT will help to the doctors learn more about the problems he is experiencing. CT was ordered last night but patient wanted to defer until this morning. Patient now wants to wait until after breakfast to drink the contrast.

## 2023-06-03 NOTE — Progress Notes (Addendum)
[facility] Progress Note  Patient: [name]    UYQ:[id number]  DOA: 06/01/2023     Date of Service: the patient was seen and examined on 06/03/2023  Chief Complaint  Patient presents with   Weakness   Brief hospital course: Mr. Petronilo Layman is a 89 year old male with history of anxiety, depression, A-fib on Eliquis, hypertension, history of sick sinus syndrome status post pacemaker placement, CAD with recent stents who presented to the emergency department for chief concerns of generalized weakness, worsening at assisted living facility.    Assessment and Plan: Generalized weakness weakness reportedly developed very quickly in just the last 6 months or so, when previously he was able to golf and walk up stairs, now he can't lift up his legs against gravity.  No muscle pain. Plan: --neuro consult for possible etiology in muscle or nerve dysfunction. --PT/OT   Chest pain, ACS ruled out CAD  --pt had DES to LCx and OM in April 2024 at [facility]; repeat cath patent stents in 01/2023  --trop 40's flat. --cardiology consulted --Echo --cont statin and plavix (not on ASA due to Eliquis)   Unintentional weight loss --pt reported eating well.  Had colonoscopy back in 2019 with polyps removed.  Had recent lung imaging at [facility] with no report of malignancy. --obtain PSA     History of anemia due to vitamin B12 deficiency --Vit B12 771 currently   Sick sinus syndrome (HCC) Status post pacemaker   Atrial fibrillation (HCC) --cont home Cardizem --cont home Eliquis   Hyperlipemia --cont home statin   Hypokalemia --due to 1 dose of IV lasix 40 pt received on presentation --monitor and supplement PRN  Iron deficiency, transferrin saturation 16%, slightly low, started Venofer followed by oral iron supplement. Folic acid 6.1 at lower end,  Started oral supplement.    Body mass index is 24.17 kg/m.  Interventions:  Diet: Heart healthy diet DVT Prophylaxis: Subcutaneous Lovenox    Advance goals of care discussion: Full code  Family Communication: family was not present at bedside, at the time of interview.  The pt provided permission to discuss medical plan with the family. Opportunity was given to ask question and all questions were answered satisfactorily.   Disposition:  Pt is from Home, admitted with generalized weakness, falls, workup in progress,  which precludes a safe discharge. Discharge to SNF, when medically stable.  Subjective: No significant events overnight, denies any complaints, feels gentleman consulted. Lower extremity edema and c/o Bilateral feet numbness which is mostly early morning.   Physical Exam: General: NAD, lying comfortably Appear in no distress, affect appropriate Eyes: PERRLA ENT: Oral Mucosa Clear, moist  Neck: no JVD,  Cardiovascular: S1 and S2 Present, no Murmur,  Respiratory: good respiratory effort, Bilateral Air entry equal and Decreased, no Crackles, no wheezes Abdomen: Bowel Sound present, Soft and no tenderness,  Skin: no rashes Extremities: no Pedal edema, no calf tenderness Neurologic: without any new focal findings Gait not checked due to patient safety concerns  Vitals:   06/03/23 0332 06/03/23 0738 06/03/23 1200 06/03/23 1544  BP: 109/61 (!) 107/59 116/65 123/68  Pulse: 60 (!) 59 60 (!) 59  Resp: 18 20 19 18   Temp: 97.9 F (36.6 C) 98.3 F (36.8 C) (!) 97.5 F (36.4 C) 98.4 F (36.9 C)  TempSrc:      SpO2: 100% 98% 99% 99%  Weight:      Height:        Intake/Output Summary (Last 24 hours) at 06/03/2023 1815  Last data filed at 06/03/2023 0102 Gross per 24 hour  Intake 250 ml  Output 475 ml  Net -225 ml   Filed Weights   06/01/23 1421  Weight: 76.4 kg    Data Reviewed: I have personally reviewed and interpreted daily labs, tele strips, imagings as discussed above. I reviewed all nursing notes, pharmacy notes, vitals, pertinent old records I have discussed plan of care as described above with  RN and patient/family.  CBC: Recent Labs  Lab 05/28/23 2228 06/01/23 1426 06/02/23 0511  WBC 5.1 5.0 7.1  HGB 11.4* 10.6* 11.3*  HCT 34.7* 31.2* 33.2*  MCV 94.0 93.1 93.3  PLT 186 221 224   Basic Metabolic Panel: Recent Labs  Lab 05/28/23 2228 06/01/23 1426 06/02/23 0511 06/03/23 0550  NA 134* 137 138 136  K 3.7 3.6 2.7* 4.0  CL 103 104 109 104  CO2 23 25 23 25   GLUCOSE 91 116* 86 103*  BUN 20 21 18 19   CREATININE 0.61 0.63 0.45* 0.55*  CALCIUM 8.5* 8.7* 7.3* 8.3*  MG  --   --  1.3* 1.7  PHOS  --   --   --  3.0    Studies: No results found.  Scheduled Meds:  apixaban  5 mg Oral BID   clopidogrel  75 mg Oral Daily   diltiazem  120 mg Oral Daily   finasteride  5 mg Oral Daily   senna-docusate  1 tablet Oral BID   tamsulosin  0.4 mg Oral QHS   Continuous Infusions: PRN Meds: acetaminophen **OR** acetaminophen, ondansetron **OR** ondansetron (ZOFRAN) IV, senna-docusate  Time spent: 35 minutes  Author: Gillis Santa. MD [facility] 06/03/2023 6:15 PM  To reach On-call, see care teams to locate the attending and reach out to them via [email or web address]. If 7PM-7AM, please contact night-coverage If you still have difficulty reaching the attending provider, please page the [facility] (Director on Call) for [facility] on amion for assistance.

## 2023-06-04 DIAGNOSIS — R0602 Shortness of breath: Secondary | ICD-10-CM | POA: Diagnosis not present

## 2023-06-04 LAB — CBC
HCT: 31.4 % — ABNORMAL LOW (ref 39.0–52.0)
Hemoglobin: 10.4 g/dL — ABNORMAL LOW (ref 13.0–17.0)
MCH: 31 pg (ref 26.0–34.0)
MCHC: 33.1 g/dL (ref 30.0–36.0)
MCV: 93.7 fL (ref 80.0–100.0)
Platelets: 209 10*3/uL (ref 150–400)
RBC: 3.35 MIL/uL — ABNORMAL LOW (ref 4.22–5.81)
RDW: 14.9 % (ref 11.5–15.5)
WBC: 5.1 10*3/uL (ref 4.0–10.5)
nRBC: 0 % (ref 0.0–0.2)

## 2023-06-04 LAB — PHOSPHORUS: Phosphorus: 3.3 mg/dL (ref 2.5–4.6)

## 2023-06-04 LAB — BASIC METABOLIC PANEL
Anion gap: 7 (ref 5–15)
BUN: 22 mg/dL (ref 8–23)
CO2: 26 mmol/L (ref 22–32)
Calcium: 8.3 mg/dL — ABNORMAL LOW (ref 8.9–10.3)
Chloride: 101 mmol/L (ref 98–111)
Creatinine, Ser: 0.6 mg/dL — ABNORMAL LOW (ref 0.61–1.24)
GFR, Estimated: >60 mL/min (ref >60)
Glucose, Bld: 91 mg/dL (ref 70–99)
Potassium: 4 mmol/L (ref 3.5–5.1)
Sodium: 134 mmol/L — ABNORMAL LOW (ref 135–145)

## 2023-06-04 LAB — ALDOLASE: Aldolase: 9.6 U/L (ref 3.3–10.3)

## 2023-06-04 LAB — ANA W/REFLEX IF POSITIVE: Anti Nuclear Antibody (ANA): NEGATIVE

## 2023-06-04 LAB — MAGNESIUM: Magnesium: 1.9 mg/dL (ref 1.7–2.4)

## 2023-06-04 MED ORDER — BISACODYL 5 MG PO TBEC: 10.0000 mg | DELAYED_RELEASE_TABLET | Freq: Every day | ORAL | Status: DC

## 2023-06-04 MED ORDER — BISACODYL 10 MG RE SUPP: 10.0000 mg | Freq: Every day | RECTAL | Status: DC | PRN

## 2023-06-04 MED ORDER — POLYETHYLENE GLYCOL 3350 17 G PO PACK
17.0000 g | PACK | Freq: Two times a day (BID) | ORAL | Status: DC
  Administered 2023-06-04 – 2023-06-06 (×3): 17 g via ORAL
  Filled 2023-06-04 (×6): qty 1

## 2023-06-04 NOTE — Progress Notes (Signed)
Physical Therapy Treatment Patient Details Name: [name] MRN: [id number] DOB: [birth year] Today's Date: 06/04/2023   History of Present Illness Patient is a 89 year old male with history of anxiety, depression, A-fib on Eliquis, hypertension, hyperlipidemia, history of sick sinus syndrome status post pacemaker placement, history of B12 deficiency, who presents to the emergency department for chief concerns of generalized weakness, worsening at assisted living facility. Current MD assessment includes: Generalized weakness, chest pain, a-fib, HLD, and hypokalemia.    PT Comments  Pt was pleasant and motivated to participate during the session and put forth good effort throughout. Pt needing Min HHA to sit EOB, once seated able to maintain seated balance. Pt able to perform STS with RW from variable heights and surfaces with Min A for initial boost, once standing pt able to take a few forwards steps with RW and turn to sit in recliner. Pt stood from recliner with attempts for further ambulation but unable to take steps secondary to pt reporting weakness in LE's and wanting to sit back in recliner. HR and SpO2 remained within functional ranges for activities performed this session. Pt will benefit from continued PT services upon discharge to safely address deficits listed in patient problem list for decreased caregiver assistance and eventual return to PLOF.    If plan is discharge home, recommend the following: A little help with walking and/or transfers;A little help with bathing/dressing/bathroom;Assistance with cooking/housework;Assist for transportation;Help with stairs or ramp for entrance   Can travel by private vehicle     No  Equipment Recommendations  Other (comment) (TBD at next venue of care)    Recommendations for Other Services       Precautions / Restrictions Precautions Precautions: Fall Required Braces or Orthoses: Other Brace Other Brace: Pt has BIL wrist braces on  bedside; states he wears them at night for carpal tunnel pain control Restrictions Weight Bearing Restrictions: No     Mobility  Bed Mobility Overal bed mobility: Needs Assistance Bed Mobility: Supine to Sit     Supine to sit: Used rails, HOB elevated, Min assist (Min HHA)     General bed mobility comments: HHA, extra time to adjust fully upright    Transfers Overall transfer level: Needs assistance Equipment used: Rolling walker (2 wheels) Transfers: Sit to/from Stand Sit to Stand: Min assist, From elevated surface           General transfer comment: provided cues for hand placement and forward lean, secondary STS once seated in recliner, pt only able to stand for ~15 sec before LE's began to feel weak.    Ambulation/Gait Ambulation/Gait assistance: Contact guard assist Gait Distance (Feet): 4 Feet Assistive device: Rolling walker (2 wheels) Gait Pattern/deviations: Decreased step length - right, Decreased step length - left, Decreased stride length, Step-through pattern Gait velocity: decreased     General Gait Details: slowed overall, able to take a few forward steps forwards and turn to sit in recliner, pr reports no concerns of dizziness or SoB   Stairs             Wheelchair Mobility     Tilt Bed    Modified Rankin (Stroke Patients Only)       Balance Overall balance assessment: Needs assistance Sitting-balance support: Feet supported, No upper extremity supported Sitting balance-Leahy Scale: Good       Standing balance-Leahy Scale: Fair Standing balance comment: Static standing at [employer]  Cognition Arousal: Alert Behavior During Therapy: WFL for tasks assessed/performed Overall Cognitive Status: Within Functional Limits for tasks assessed                                          Exercises      General Comments        Pertinent Vitals/Pain Pain Assessment Pain Assessment:  No/denies pain    Home Living                          Prior Function            PT Goals (current goals can now be found in the care plan section) Progress towards PT goals: Progressing toward goals    Frequency    Min 1X/week      PT Plan      Co-evaluation              AM-PAC PT "6 Clicks" Mobility   Outcome Measure  Help needed turning from your back to your side while in a flat bed without using bedrails?: A Little Help needed moving from lying on your back to sitting on the side of a flat bed without using bedrails?: A Little Help needed moving to and from a bed to a chair (including a wheelchair)?: A Little Help needed standing up from a chair using your arms (e.g., wheelchair or bedside chair)?: A Little Help needed to walk in hospital room?: A Lot Help needed climbing 3-5 steps with a railing? : A Lot 6 Click Score: 16    End of Session Equipment Utilized During Treatment: Gait belt Activity Tolerance: Patient tolerated treatment well Patient left: in chair;with call bell/phone within reach;with chair alarm set   PT Visit Diagnosis: Unsteadiness on feet (R26.81);Muscle weakness (generalized) (M62.81);Difficulty in walking, not elsewhere classified (R26.2)     Time: 6213-0865 PT Time Calculation (min) (ACUTE ONLY): 23 min  Charges:                            Cecile Sheerer, SPT 06/04/23, 12:55 PM

## 2023-06-04 NOTE — Progress Notes (Signed)
[facility] Progress Note  Patient: [name]    UJW:[id number]  DOA: 06/01/2023     Date of Service: the patient was seen and examined on 06/04/2023  Chief Complaint  Patient presents with   Weakness   Brief hospital course: Mr. Jiro Arvizo is a 89 year old male with history of anxiety, depression, A-fib on Eliquis, hypertension, history of sick sinus syndrome status post pacemaker placement, CAD with recent stents who presented to the emergency department for chief concerns of generalized weakness, worsening at assisted living facility.    Assessment and Plan: Generalized weakness weakness reportedly developed very quickly in just the last 6 months or so, when previously he was able to golf and walk up stairs, now he can't lift up his legs against gravity.  No muscle pain. Plan: --neuro consult for possible etiology in muscle or nerve dysfunction. --PT/OT   Chest pain, ACS ruled out CAD  --pt had DES to LCx and OM in April 2024 at [facility]; repeat cath patent stents in 01/2023  --trop 40's flat. --cardiology consulted, recommended no intervention --Echo LVEF 60 to 65%, moderate LVH, no any other significant findings --cont statin and plavix (not on ASA due to Eliquis)   Unintentional weight loss --pt reported eating well.  Had colonoscopy back in 2019 with polyps removed.  Had recent lung imaging at [facility] with no report of malignancy. -- PSA  0.39 wnl CT c/a/p: 1. Indeterminate 1 cm diameter ground-glass nodule in the left lung apex. Neoplasm is not excluded. Initial follow-up with CT at 6 months is recommended to confirm persistence. If persistent, repeat CT is recommended every 2 years until 5 years of stability has been established. 2. Diffuse interstitial pattern to the lungs most prominent in the periphery, likely edema. Small bilateral pleural effusions. 3. Cardiac enlargement. 4. Aortic atherosclerosis. 5. Small abdominal ascites.  6. Prostate is enlarged.   History of  anemia due to vitamin B12 deficiency --Vit B12 771 currently   Sick sinus syndrome Status post pacemaker   Atrial fibrillation (HCC) --cont home Eliquis.  Patient is not on Cardizem which has been discontinued   Hyperlipemia: cont home statin   Hypokalemia, Resolved. K repleted --monitor and supplement PRN  Iron deficiency, transferrin saturation 16%, slightly low, started Venofer followed by oral iron supplement. Folic acid 6.1 at lower end,  Started oral supplement.  Pulmonary nodule CT chest:  Indeterminate 1 cm diameter ground-glass nodule in the left lung apex. Neoplasm is not excluded. Initial follow-up with CT at 6 months is recommended to confirm persistence. If persistent, repeat CT is recommended every 2 years until 5 years of stability has been established.  Follow-up as an outpatient with PCP and pulmonologist  Body mass index is 24.17 kg/m.  Interventions:  Diet: Heart healthy diet DVT Prophylaxis: Subcutaneous Lovenox   Advance goals of care discussion: Full code  Family Communication: family was not present at bedside, at the time of interview.  The pt provided permission to discuss medical plan with the family. Opportunity was given to ask question and all questions were answered satisfactorily.   Disposition:  Pt is from Home, admitted with generalized weakness, falls, workup in progress,  which precludes a safe discharge. Discharge to SNF, when medically stable.  Subjective: No significant events overnight, still feels generalized weakness, after working with PT he feels more weaker, denies any specific complaints. Denies any chest pain or palpitations.  Physical Exam: General: NAD, lying comfortably Appear in no distress, affect appropriate Eyes: PERRLA ENT:  Oral Mucosa Clear, moist  Neck: no JVD,  Cardiovascular: S1 and S2 Present, no Murmur,  Respiratory: good respiratory effort, Bilateral Air entry equal and Decreased, no Crackles, no  wheezes Abdomen: Bowel Sound present, Soft and no tenderness,  Skin: no rashes Extremities: no Pedal edema, no calf tenderness Neurologic: without any new focal findings Gait not checked due to patient safety concerns  Vitals:   06/03/23 1949 06/03/23 2326 06/04/23 0422 06/04/23 0835  BP: (!) 111/57 118/61 117/63 (!) 109/58  Pulse: 60 60 63 62  Resp: 18 18 19 16   Temp: 98.8 F (37.1 C) 97.7 F (36.5 C) 97.8 F (36.6 C) (!) 97.5 F (36.4 C)  TempSrc: Oral Oral Oral Oral  SpO2: 98% 98% 97% 96%  Weight:      Height:        Intake/Output Summary (Last 24 hours) at 06/04/2023 0947 Last data filed at 06/04/2023 0230 Gross per 24 hour  Intake 350 ml  Output 575 ml  Net -225 ml   Filed Weights   06/01/23 1421  Weight: 76.4 kg    Data Reviewed: I have personally reviewed and interpreted daily labs, tele strips, imagings as discussed above. I reviewed all nursing notes, pharmacy notes, vitals, pertinent old records I have discussed plan of care as described above with RN and patient/family.  CBC: Recent Labs  Lab 05/28/23 2228 06/01/23 1426 06/02/23 0511 06/04/23 0416  WBC 5.1 5.0 7.1 5.1  HGB 11.4* 10.6* 11.3* 10.4*  HCT 34.7* 31.2* 33.2* 31.4*  MCV 94.0 93.1 93.3 93.7  PLT 186 221 224 209   Basic Metabolic Panel: Recent Labs  Lab 05/28/23 2228 06/01/23 1426 06/02/23 0511 06/03/23 0550 06/04/23 0416  NA 134* 137 138 136 134*  K 3.7 3.6 2.7* 4.0 4.0  CL 103 104 109 104 101  CO2 23 25 23 25 26   GLUCOSE 91 116* 86 103* 91  BUN 20 21 18 19 22   CREATININE 0.61 0.63 0.45* 0.55* 0.60*  CALCIUM 8.5* 8.7* 7.3* 8.3* 8.3*  MG  --   --  1.3* 1.7 1.9  PHOS  --   --   --  3.0 3.3    Studies: CT CHEST ABDOMEN PELVIS W CONTRAST  Addendum Date: 06/03/2023   ADDENDUM REPORT: 06/03/2023 19:59 ADDENDUM: Additional history obtained: Generalized weakness and fatigue for 6 months. Chest pain and difficulty breathing for couple of days. Electronically Signed   By: Burman Nieves M.D.   On: 06/03/2023 19:59   Result Date: 06/03/2023 CLINICAL DATA:  Occult malignancy. EXAM: CT CHEST, ABDOMEN, AND PELVIS WITH CONTRAST TECHNIQUE: Multidetector CT imaging of the chest, abdomen and pelvis was performed following the standard protocol during bolus administration of intravenous contrast. RADIATION DOSE REDUCTION: This exam was performed according to the departmental dose-optimization program which includes automated exposure control, adjustment of the mA and/or kV according to patient size and/or use of iterative reconstruction technique. CONTRAST:  OMNIPAQUE IOHEXOL 300 MG/ML  SOLN COMPARISON:  Chest radiograph 06/01/2023. CT abdomen and pelvis 11/01/2018 FINDINGS: CT CHEST FINDINGS Cardiovascular: Cardiac enlargement. No pericardial effusions. Cardiac pacemaker. Normal caliber thoracic aorta. No dissection. Calcification of the aorta and coronary arteries. Prominent hemi azygous vein. Mediastinum/Nodes: Thyroid gland is unremarkable. Esophagus is decompressed. No significant lymphadenopathy. Lungs/Pleura: Small bilateral pleural effusions with basilar atelectasis. Diffuse interstitial pattern to the lungs, mostly peripheral. This could represent acute edema or chronic fibrosis. There is a ground-glass nodule in the left apex measuring 1 cm diameter. This could represent neoplasm  or inflammatory process. Musculoskeletal: Degenerative changes in the spine. No destructive bone lesions. Intramuscular lipoma in the right shoulder. CT ABDOMEN PELVIS FINDINGS Hepatobiliary: Multiple circumscribed low-attenuation lesions in the liver. Largest is in segment 4 measuring 4.6 cm diameter. No complicating features are indicated. No change since prior study. This is consistent with simple cysts. No imaging follow-up is indicated. Gallbladder is surgically absent. No bile duct dilatation. Pancreas: Unremarkable. No pancreatic ductal dilatation or surrounding inflammatory changes. Spleen: Normal  in size without focal abnormality. Adrenals/Urinary Tract: Adrenal glands are unremarkable. Kidneys are normal, without renal calculi, focal lesion, or hydronephrosis. Bladder is unremarkable. Stomach/Bowel: Stomach, small bowel, and colon are not abnormally distended. No wall thickening or inflammatory changes. Stool fills the colon suggesting possible constipation. Scattered colonic diverticula without evidence of acute diverticulitis. Sigmoid colonic anastomosis. Appendix is not identified. Vascular/Lymphatic: Aortic atherosclerosis. No enlarged abdominal or pelvic lymph nodes. Reproductive: Prostate gland is enlarged. Other: Small amount of free fluid in the upper abdomen along the pericolic gutters. No free air. Minimal ventral abdominal wall hernia containing fat. Musculoskeletal: Degenerative changes in the spine. Degenerative changes in the hips. Lucent and sclerotic changes in the L1 vertebra likely representing vertebral hemangioma. Small focal areas of sclerosis in the pelvis are nonspecific but likely benign bone islands. No change since previous study. Fusion of the upper SI joints. IMPRESSION: 1. Indeterminate 1 cm diameter ground-glass nodule in the left lung apex. Neoplasm is not excluded. Initial follow-up with CT at 6 months is recommended to confirm persistence. If persistent, repeat CT is recommended every 2 years until 5 years of stability has been established. This recommendation follows the consensus statement: Guidelines for Management of Incidental Pulmonary Nodules Detected on CT Images: From the Fleischner Society 2017; Radiology 2017; 284:228-243. 2. Diffuse interstitial pattern to the lungs most prominent in the periphery, likely edema. Small bilateral pleural effusions. 3. Cardiac enlargement. 4. Aortic atherosclerosis. 5. Small abdominal ascites. 6. Prostate is enlarged. Electronically Signed: By: Burman Nieves M.D. On: 06/03/2023 19:43   CT CERVICAL SPINE WO CONTRAST  Result  Date: 06/03/2023 CLINICAL DATA:  Ataxia, nontraumatic. EXAM: CT CERVICAL SPINE WITHOUT CONTRAST CT THORACIC AND LUMBAR SPINE WITH CONTRAST TECHNIQUE: Multidetector CT imaging of the cervical spine was performed without intravenous contrast. Multiplanar CT image reconstructions were also generated. Multiplanar CT images of the thoracic and lumbar spine were reconstructed from contemporary CT of the Chest, Abdomen, and Pelvis. RADIATION DOSE REDUCTION: This exam was performed according to the departmental dose-optimization program which includes automated exposure control, adjustment of the mA and/or kV according to patient size and/or use of iterative reconstruction technique. COMPARISON:  CT abdomen/pelvis 11/01/2018, two-view chest radiograph 09/30/2019 FINDINGS: CT CERVICAL SPINE FINDINGS Alignment: There is trace anterolisthesis of C4 on C5. There is no evidence of traumatic malalignment. Skull base and vertebrae: Skull base alignment is maintained. Vertebral body heights are preserved, without evidence of acute fracture. There is no suspicious osseous lesion. Soft tissues and spinal canal: No prevertebral fluid or swelling. No visible canal hematoma. Disc levels: There is moderate to severe disc space narrowing at C5-C6 through C7-T1. C1-C2: There is advanced degenerative change of the atlantodental articulation with mild pannus but no narrowing of the craniocervical junction. C2-C3: There is moderate bilateral facet arthropathy without significant spinal canal or neural foraminal stenosis. C3-C4: There is mild bilateral uncovertebral ridging and moderate bilateral facet arthropathy resulting in mild-to-moderate bilateral neural foraminal stenosis without significant spinal canal stenosis C4-C5: There is trace anterolisthesis with bilateral uncovertebral ridging  and moderate to severe bilateral facet arthropathy resulting in moderate to severe bilateral neural foraminal stenosis without significant spinal canal  stenosis C5-C6: There is partial osseous fusion across the disc space with bilateral uncovertebral ridging and mild facet arthropathy resulting in moderate right and no significant left neural foraminal stenosis and no significant spinal canal stenosis C6-C7: Bilateral uncovertebral ridging resulting in mild bilateral neural foraminal stenosis without significant spinal canal stenosis C7-T1: Mild bilateral uncovertebral ridging and facet arthropathy resulting in mild left and no significant right neural foraminal stenosis and no significant spinal canal stenosis. Upper chest: Assessed on the separately dictated CT chest. CT THORACIC SPINE FINDINGS Alignment: There is mild thoracic dextrocurvature. There is no antero or retrolisthesis. Vertebrae: There is a 1.2 cm lucent lesion in the T11 vertebral body and a smaller lucent lesion in the T10 vertebral body, both of which are unchanged since 2015 consistent with benign lesions, likely hemangiomas. There are no other focal lesions. Vertebral body heights are preserved, without evidence of acute fracture. Paraspinal and other soft tissues: The paraspinal soft tissues are unremarkable. The chest is assessed on the separately dictated CT chest. Disc levels: The disc spaces are preserved. There is mild degenerative endplate change anteriorly in the midthoracic spine most notably at T8-T9. There is overall mild multilevel facet arthropathy. There is no significant disc herniation by CT. There is no significant osseous spinal canal or neural foraminal stenosis. CT LUMBAR SPINE FINDINGS Segmentation: Standard; the lowest formed disc space is designated L5-S1. Alignment: There is mild lumbar levocurvature centered at L3. There is no antero or retrolisthesis. Vertebrae: The large hemangioma occupying essentially the entirety of the L1 vertebral body is unchanged going back to 2015. There are no other suspicious osseous lesions. Vertebral body heights are preserved, without  evidence of acute fracture. Paraspinal and other soft tissues: The paraspinal soft tissues are unremarkable. The abdominal and pelvic viscera are assessed on the separately dictated CT abdomen/pelvis. Disc levels: There is multilevel moderate to advanced disc space narrowing with vacuum disc phenomenon throughout the lumbar spine. T12-L1: No significant spinal canal or neural foraminal stenosis L1-L2: There is mild-to-moderate bilateral facet arthropathy without high-grade spinal canal or neural foraminal stenosis L2-L3: There is a disc bulge, mild endplate spurring, and moderate bilateral facet arthropathy resulting in mild left worse than right neural foraminal stenosis without significant spinal canal stenosis L3-L4: There is a disc bulge, endplate spurring, and mild bilateral facet arthropathy resulting in probable mild spinal canal stenosis and mild bilateral neural foraminal stenosis L4-L5: Manson Passey there is a disc bulge, endplate spurring, and moderate to advanced right worse than left facet arthropathy resulting in moderate left and mild right neural foraminal stenosis and probable mild spinal canal stenosis L5-S1: There is right worse than left endplate spurring and moderate bilateral facet arthropathy resulting in moderate left and no significant right neural foraminal stenosis and no significant spinal canal stenosis. IMPRESSION: 1. No acute finding in the cervical, thoracic, or lumbar spine. 2. Multilevel uncovertebral ridging and facet arthropathy throughout the cervical spine as detailed above resulting in up to moderate to severe bilateral neural foraminal stenosis C4-C5. No high-grade spinal canal stenosis at any level in the cervical spine. 3. Mild degenerative changes in the thoracic spine without high-grade spinal canal or neural foraminal stenosis. 4. Multilevel disc and facet degeneration throughout the lumbar spine as detailed above resulting in up to moderate left neural foraminal stenosis at  L4-L5 and L5-S1. No high-grade spinal canal stenosis at any level. Electronically  Signed   By: Lesia Hausen M.D.   On: 06/03/2023 19:55   CT T-SPINE NO CHARGE  Result Date: 06/03/2023 CLINICAL DATA:  Ataxia, nontraumatic. EXAM: CT CERVICAL SPINE WITHOUT CONTRAST CT THORACIC AND LUMBAR SPINE WITH CONTRAST TECHNIQUE: Multidetector CT imaging of the cervical spine was performed without intravenous contrast. Multiplanar CT image reconstructions were also generated. Multiplanar CT images of the thoracic and lumbar spine were reconstructed from contemporary CT of the Chest, Abdomen, and Pelvis. RADIATION DOSE REDUCTION: This exam was performed according to the departmental dose-optimization program which includes automated exposure control, adjustment of the mA and/or kV according to patient size and/or use of iterative reconstruction technique. COMPARISON:  CT abdomen/pelvis 11/01/2018, two-view chest radiograph 09/30/2019 FINDINGS: CT CERVICAL SPINE FINDINGS Alignment: There is trace anterolisthesis of C4 on C5. There is no evidence of traumatic malalignment. Skull base and vertebrae: Skull base alignment is maintained. Vertebral body heights are preserved, without evidence of acute fracture. There is no suspicious osseous lesion. Soft tissues and spinal canal: No prevertebral fluid or swelling. No visible canal hematoma. Disc levels: There is moderate to severe disc space narrowing at C5-C6 through C7-T1. C1-C2: There is advanced degenerative change of the atlantodental articulation with mild pannus but no narrowing of the craniocervical junction. C2-C3: There is moderate bilateral facet arthropathy without significant spinal canal or neural foraminal stenosis. C3-C4: There is mild bilateral uncovertebral ridging and moderate bilateral facet arthropathy resulting in mild-to-moderate bilateral neural foraminal stenosis without significant spinal canal stenosis C4-C5: There is trace anterolisthesis with bilateral  uncovertebral ridging and moderate to severe bilateral facet arthropathy resulting in moderate to severe bilateral neural foraminal stenosis without significant spinal canal stenosis C5-C6: There is partial osseous fusion across the disc space with bilateral uncovertebral ridging and mild facet arthropathy resulting in moderate right and no significant left neural foraminal stenosis and no significant spinal canal stenosis C6-C7: Bilateral uncovertebral ridging resulting in mild bilateral neural foraminal stenosis without significant spinal canal stenosis C7-T1: Mild bilateral uncovertebral ridging and facet arthropathy resulting in mild left and no significant right neural foraminal stenosis and no significant spinal canal stenosis. Upper chest: Assessed on the separately dictated CT chest. CT THORACIC SPINE FINDINGS Alignment: There is mild thoracic dextrocurvature. There is no antero or retrolisthesis. Vertebrae: There is a 1.2 cm lucent lesion in the T11 vertebral body and a smaller lucent lesion in the T10 vertebral body, both of which are unchanged since 2015 consistent with benign lesions, likely hemangiomas. There are no other focal lesions. Vertebral body heights are preserved, without evidence of acute fracture. Paraspinal and other soft tissues: The paraspinal soft tissues are unremarkable. The chest is assessed on the separately dictated CT chest. Disc levels: The disc spaces are preserved. There is mild degenerative endplate change anteriorly in the midthoracic spine most notably at T8-T9. There is overall mild multilevel facet arthropathy. There is no significant disc herniation by CT. There is no significant osseous spinal canal or neural foraminal stenosis. CT LUMBAR SPINE FINDINGS Segmentation: Standard; the lowest formed disc space is designated L5-S1. Alignment: There is mild lumbar levocurvature centered at L3. There is no antero or retrolisthesis. Vertebrae: The large hemangioma occupying  essentially the entirety of the L1 vertebral body is unchanged going back to 2015. There are no other suspicious osseous lesions. Vertebral body heights are preserved, without evidence of acute fracture. Paraspinal and other soft tissues: The paraspinal soft tissues are unremarkable. The abdominal and pelvic viscera are assessed on the separately dictated CT abdomen/pelvis.  Disc levels: There is multilevel moderate to advanced disc space narrowing with vacuum disc phenomenon throughout the lumbar spine. T12-L1: No significant spinal canal or neural foraminal stenosis L1-L2: There is mild-to-moderate bilateral facet arthropathy without high-grade spinal canal or neural foraminal stenosis L2-L3: There is a disc bulge, mild endplate spurring, and moderate bilateral facet arthropathy resulting in mild left worse than right neural foraminal stenosis without significant spinal canal stenosis L3-L4: There is a disc bulge, endplate spurring, and mild bilateral facet arthropathy resulting in probable mild spinal canal stenosis and mild bilateral neural foraminal stenosis L4-L5: Manson Passey there is a disc bulge, endplate spurring, and moderate to advanced right worse than left facet arthropathy resulting in moderate left and mild right neural foraminal stenosis and probable mild spinal canal stenosis L5-S1: There is right worse than left endplate spurring and moderate bilateral facet arthropathy resulting in moderate left and no significant right neural foraminal stenosis and no significant spinal canal stenosis. IMPRESSION: 1. No acute finding in the cervical, thoracic, or lumbar spine. 2. Multilevel uncovertebral ridging and facet arthropathy throughout the cervical spine as detailed above resulting in up to moderate to severe bilateral neural foraminal stenosis C4-C5. No high-grade spinal canal stenosis at any level in the cervical spine. 3. Mild degenerative changes in the thoracic spine without high-grade spinal canal or  neural foraminal stenosis. 4. Multilevel disc and facet degeneration throughout the lumbar spine as detailed above resulting in up to moderate left neural foraminal stenosis at L4-L5 and L5-S1. No high-grade spinal canal stenosis at any level. Electronically Signed   By: Lesia Hausen M.D.   On: 06/03/2023 19:55   CT L-SPINE NO CHARGE  Result Date: 06/03/2023 CLINICAL DATA:  Ataxia, nontraumatic. EXAM: CT CERVICAL SPINE WITHOUT CONTRAST CT THORACIC AND LUMBAR SPINE WITH CONTRAST TECHNIQUE: Multidetector CT imaging of the cervical spine was performed without intravenous contrast. Multiplanar CT image reconstructions were also generated. Multiplanar CT images of the thoracic and lumbar spine were reconstructed from contemporary CT of the Chest, Abdomen, and Pelvis. RADIATION DOSE REDUCTION: This exam was performed according to the departmental dose-optimization program which includes automated exposure control, adjustment of the mA and/or kV according to patient size and/or use of iterative reconstruction technique. COMPARISON:  CT abdomen/pelvis 11/01/2018, two-view chest radiograph 09/30/2019 FINDINGS: CT CERVICAL SPINE FINDINGS Alignment: There is trace anterolisthesis of C4 on C5. There is no evidence of traumatic malalignment. Skull base and vertebrae: Skull base alignment is maintained. Vertebral body heights are preserved, without evidence of acute fracture. There is no suspicious osseous lesion. Soft tissues and spinal canal: No prevertebral fluid or swelling. No visible canal hematoma. Disc levels: There is moderate to severe disc space narrowing at C5-C6 through C7-T1. C1-C2: There is advanced degenerative change of the atlantodental articulation with mild pannus but no narrowing of the craniocervical junction. C2-C3: There is moderate bilateral facet arthropathy without significant spinal canal or neural foraminal stenosis. C3-C4: There is mild bilateral uncovertebral ridging and moderate bilateral facet  arthropathy resulting in mild-to-moderate bilateral neural foraminal stenosis without significant spinal canal stenosis C4-C5: There is trace anterolisthesis with bilateral uncovertebral ridging and moderate to severe bilateral facet arthropathy resulting in moderate to severe bilateral neural foraminal stenosis without significant spinal canal stenosis C5-C6: There is partial osseous fusion across the disc space with bilateral uncovertebral ridging and mild facet arthropathy resulting in moderate right and no significant left neural foraminal stenosis and no significant spinal canal stenosis C6-C7: Bilateral uncovertebral ridging resulting in mild bilateral neural foraminal stenosis  without significant spinal canal stenosis C7-T1: Mild bilateral uncovertebral ridging and facet arthropathy resulting in mild left and no significant right neural foraminal stenosis and no significant spinal canal stenosis. Upper chest: Assessed on the separately dictated CT chest. CT THORACIC SPINE FINDINGS Alignment: There is mild thoracic dextrocurvature. There is no antero or retrolisthesis. Vertebrae: There is a 1.2 cm lucent lesion in the T11 vertebral body and a smaller lucent lesion in the T10 vertebral body, both of which are unchanged since 2015 consistent with benign lesions, likely hemangiomas. There are no other focal lesions. Vertebral body heights are preserved, without evidence of acute fracture. Paraspinal and other soft tissues: The paraspinal soft tissues are unremarkable. The chest is assessed on the separately dictated CT chest. Disc levels: The disc spaces are preserved. There is mild degenerative endplate change anteriorly in the midthoracic spine most notably at T8-T9. There is overall mild multilevel facet arthropathy. There is no significant disc herniation by CT. There is no significant osseous spinal canal or neural foraminal stenosis. CT LUMBAR SPINE FINDINGS Segmentation: Standard; the lowest formed disc  space is designated L5-S1. Alignment: There is mild lumbar levocurvature centered at L3. There is no antero or retrolisthesis. Vertebrae: The large hemangioma occupying essentially the entirety of the L1 vertebral body is unchanged going back to 2015. There are no other suspicious osseous lesions. Vertebral body heights are preserved, without evidence of acute fracture. Paraspinal and other soft tissues: The paraspinal soft tissues are unremarkable. The abdominal and pelvic viscera are assessed on the separately dictated CT abdomen/pelvis. Disc levels: There is multilevel moderate to advanced disc space narrowing with vacuum disc phenomenon throughout the lumbar spine. T12-L1: No significant spinal canal or neural foraminal stenosis L1-L2: There is mild-to-moderate bilateral facet arthropathy without high-grade spinal canal or neural foraminal stenosis L2-L3: There is a disc bulge, mild endplate spurring, and moderate bilateral facet arthropathy resulting in mild left worse than right neural foraminal stenosis without significant spinal canal stenosis L3-L4: There is a disc bulge, endplate spurring, and mild bilateral facet arthropathy resulting in probable mild spinal canal stenosis and mild bilateral neural foraminal stenosis L4-L5: Manson Passey there is a disc bulge, endplate spurring, and moderate to advanced right worse than left facet arthropathy resulting in moderate left and mild right neural foraminal stenosis and probable mild spinal canal stenosis L5-S1: There is right worse than left endplate spurring and moderate bilateral facet arthropathy resulting in moderate left and no significant right neural foraminal stenosis and no significant spinal canal stenosis. IMPRESSION: 1. No acute finding in the cervical, thoracic, or lumbar spine. 2. Multilevel uncovertebral ridging and facet arthropathy throughout the cervical spine as detailed above resulting in up to moderate to severe bilateral neural foraminal stenosis  C4-C5. No high-grade spinal canal stenosis at any level in the cervical spine. 3. Mild degenerative changes in the thoracic spine without high-grade spinal canal or neural foraminal stenosis. 4. Multilevel disc and facet degeneration throughout the lumbar spine as detailed above resulting in up to moderate left neural foraminal stenosis at L4-L5 and L5-S1. No high-grade spinal canal stenosis at any level. Electronically Signed   By: Lesia Hausen M.D.   On: 06/03/2023 19:55    Scheduled Meds:  apixaban  5 mg Oral BID   bisacodyl  10 mg Oral QHS   clopidogrel  75 mg Oral Daily   finasteride  5 mg Oral Daily   folic acid  1 mg Oral Daily   [START ON 06/08/2023] iron polysaccharides  150 mg  Oral Daily   polyethylene glycol  17 g Oral BID   tamsulosin  0.4 mg Oral QHS   Continuous Infusions:  iron sucrose 200 mg (06/03/23 2234)   PRN Meds: acetaminophen **OR** acetaminophen, bisacodyl, ondansetron **OR** ondansetron (ZOFRAN) IV, senna-docusate  Time spent: 35 minutes  Author: Gillis Santa. MD [facility] 06/04/2023 9:47 AM  To reach On-call, see care teams to locate the attending and reach out to them via [email or web address]. If 7PM-7AM, please contact night-coverage If you still have difficulty reaching the attending provider, please page the [facility] (Director on Call) for [facility] on amion for assistance.

## 2023-06-04 NOTE — TOC Progression Note (Signed)
Transition of Care [facility]) - Progression Note    Patient Details  Name: CORNELL LUTMAN MRN: [id number] Date of Birth: January 23, 1933  [facility] CM/SW Contact  Truddie Hidden, RN Phone Number: 06/04/2023, 2:59 PM  Clinical Narrative:    Attempt to reach patient's grandson to discuss therapy 's recommendation for SNF. No answer. Left a message.         Expected Discharge Plan and Services                                               Social Determinants of Health (SDOH) Interventions SDOH Screenings   Depression (PHQ2-9): Medium Risk (03/06/2023)  Tobacco Use: Medium Risk (06/01/2023)    Readmission Risk Interventions     No data to display

## 2023-06-05 DIAGNOSIS — R531 Weakness: Secondary | ICD-10-CM | POA: Diagnosis not present

## 2023-06-05 DIAGNOSIS — R0602 Shortness of breath: Secondary | ICD-10-CM | POA: Diagnosis not present

## 2023-06-05 LAB — UPEP/UIFE/LIGHT CHAINS/TP, 24-HR UR
% BETA, Urine: 0 %
ALPHA 1 URINE: 0 %
Albumin, U: 100 %
Alpha 2, Urine: 0 %
Free Kappa Lt Chains,Ur: 39.87 mg/L (ref 1.17–86.46)
Free Kappa/Lambda Ratio: 14.24 (ref 1.83–14.26)
Free Lambda Lt Chains,Ur: 2.8 mg/L (ref 0.27–15.21)
GAMMA GLOBULIN URINE: 0 %
Total Protein, Urine: 17.4 mg/dL

## 2023-06-05 LAB — CBC
HCT: 31.3 % — ABNORMAL LOW (ref 39.0–52.0)
Hemoglobin: 10.7 g/dL — ABNORMAL LOW (ref 13.0–17.0)
MCH: 31.8 pg (ref 26.0–34.0)
MCHC: 34.2 g/dL (ref 30.0–36.0)
MCV: 93.2 fL (ref 80.0–100.0)
Platelets: 194 10*3/uL (ref 150–400)
RBC: 3.36 MIL/uL — ABNORMAL LOW (ref 4.22–5.81)
RDW: 15 % (ref 11.5–15.5)
WBC: 4.4 10*3/uL (ref 4.0–10.5)
nRBC: 0 % (ref 0.0–0.2)

## 2023-06-05 LAB — COPPER, SERUM: Copper: 85 ug/dL (ref 69–132)

## 2023-06-05 LAB — BASIC METABOLIC PANEL
Anion gap: 8 (ref 5–15)
BUN: 20 mg/dL (ref 8–23)
CO2: 26 mmol/L (ref 22–32)
Calcium: 8.5 mg/dL — ABNORMAL LOW (ref 8.9–10.3)
Chloride: 101 mmol/L (ref 98–111)
Creatinine, Ser: 0.52 mg/dL — ABNORMAL LOW (ref 0.61–1.24)
GFR, Estimated: >60 mL/min (ref >60)
Glucose, Bld: 86 mg/dL (ref 70–99)
Potassium: 3.9 mmol/L (ref 3.5–5.1)
Sodium: 135 mmol/L (ref 135–145)

## 2023-06-05 LAB — ZINC: Zinc: 43 ug/dL — ABNORMAL LOW (ref 44–115)

## 2023-06-05 LAB — PHOSPHORUS: Phosphorus: 3.5 mg/dL (ref 2.5–4.6)

## 2023-06-05 LAB — MAGNESIUM: Magnesium: 1.9 mg/dL (ref 1.7–2.4)

## 2023-06-05 MED ORDER — PREDNISONE 20 MG PO TABS
40.0000 mg | ORAL_TABLET | Freq: Every day | ORAL | Status: DC
  Administered 2023-06-06 – 2023-06-07 (×2): 40 mg via ORAL
  Filled 2023-06-05 (×2): qty 2

## 2023-06-05 MED ORDER — PANTOPRAZOLE SODIUM 40 MG PO TBEC
40.0000 mg | DELAYED_RELEASE_TABLET | Freq: Every day | ORAL | Status: DC
  Administered 2023-06-06 – 2023-06-07 (×2): 40 mg via ORAL
  Filled 2023-06-05 (×2): qty 1

## 2023-06-05 MED ORDER — ZINC SULFATE 220 (50 ZN) MG PO CAPS
220.0000 mg | ORAL_CAPSULE | Freq: Every day | ORAL | Status: DC
  Administered 2023-06-05 – 2023-06-07 (×3): 220 mg via ORAL
  Filled 2023-06-05 (×3): qty 1

## 2023-06-05 NOTE — Plan of Care (Signed)
Progressing

## 2023-06-05 NOTE — TOC Progression Note (Addendum)
Transition of Care [facility]) - Progression Note    Patient Details  Name: EMMANUELL SELFE MRN: [id number] Date of Birth: [birth year]  Transition of Care [facility]) CM/SW Contact  Truddie Hidden, RN Phone Number: 06/05/2023, 10:23 AM  Clinical Narrative:    Attempt to reach patient's [name] to discuss patient's discharge plan. No answer. Left a message.   Retrieved a call from patient's, [name]. RNCM discuss therapy's recommendation for STR. He was provided information about area SNF's. Feliz Beam stated he is agreeable but would like to speak with patient's daughter's before making a decision about the location.   2:30pm Spoke with patient's Grandson. He is agreeable to bed search at [facility], [address], [facility], and [facility]. Fax sent to [facility] @ [phone]        Expected Discharge Plan and Services                                               Social Determinants of Health (SDOH) Interventions SDOH Screenings   Depression (PHQ2-9): Medium Risk (03/06/2023)  Tobacco Use: Medium Risk (06/01/2023)    Readmission Risk Interventions     No data to display

## 2023-06-05 NOTE — Progress Notes (Signed)
[facility] Progress Note  Patient: [name]    NGE:[id number]  DOA: 06/01/2023     Date of Service: the patient was seen and examined on 06/05/2023  Chief Complaint  Patient presents with   Weakness   Brief hospital course: Mr. [name] is a 89 year old male with history of anxiety, depression, A-fib on Eliquis, hypertension, history of sick sinus syndrome status post pacemaker placement, CAD with recent stents who presented to the emergency department for chief concerns of generalized weakness, worsening at assisted living facility.    Assessment and Plan: Generalized weakness weakness reportedly developed very quickly in just the last 6 months or so, when previously he was able to golf and walk up stairs, now he can't lift up his legs against gravity.  No muscle pain. Neurology consulted, workup so far negative, recommended EMG/NCS as an outpatient MRI brain and MRI cervical spine could not be done here at [facility] due to pacemaker, patient may need to follow as an outpatient with neurology if MRI needs to be done then patient is to go to [facility] health. Empirically started prednisone 40 mg p.o. daily for 3 days PT/OT eval recommend SNF placement Follow-up with neurology as an outpatient Follow Lyme disease serology  Chest pain, ACS ruled out CAD  --pt had DES to LCx and OM in April 2024 at [facility]; repeat cath patent stents in 01/2023  --trop 40's flat. --cardiology consulted, recommended no intervention --Echo LVEF 60 to 65%, moderate LVH, no any other significant findings --cont statin and plavix (not on ASA due to Eliquis)   Unintentional weight loss --pt reported eating well.  Had colonoscopy back in 2019 with polyps removed.  Had recent lung imaging at [facility] with no report of malignancy. -- PSA  0.39 wnl CT c/a/p: 1. Indeterminate 1 cm diameter ground-glass nodule in the left lung apex. Neoplasm is not excluded. Initial follow-up with CT at 6 months is recommended to confirm  persistence. If persistent, repeat CT is recommended every 2 years until 5 years of stability has been established. 2. Diffuse interstitial pattern to the lungs most prominent in the periphery, likely edema. Small bilateral pleural effusions. 3. Cardiac enlargement. 4. Aortic atherosclerosis. 5. Small abdominal ascites.  6. Prostate is enlarged.   History of anemia due to vitamin B12 deficiency --Vit B12 771 currently   Sick sinus syndrome Status post pacemaker   Atrial fibrillation (HCC) --cont home Eliquis.  Patient is not on Cardizem which has been discontinued   Hyperlipemia: cont home statin   Hypokalemia, Resolved. K repleted --monitor and supplement PRN  Iron deficiency, transferrin saturation 16%, slightly low, started Venofer followed by oral iron supplement. Folic acid 6.1 at lower end,  Started oral supplement. Zinc deficiency, started oral zinc supplement.   Pulmonary nodule CT chest:  Indeterminate 1 cm diameter ground-glass nodule in the left lung apex. Neoplasm is not excluded. Initial follow-up with CT at 6 months is recommended to confirm persistence. If persistent, repeat CT is recommended every 2 years until 5 years of stability has been established.  Follow-up as an outpatient with PCP and pulmonologist  Body mass index is 24.17 kg/m.  Interventions:  Diet: Heart healthy diet DVT Prophylaxis: Subcutaneous Lovenox   Advance goals of care discussion: Full code  Family Communication: family was not present at bedside, at the time of interview.  The pt provided permission to discuss medical plan with the family. Opportunity was given to ask question and all questions were answered satisfactorily.  Disposition:  Pt is from Home, admitted with generalized weakness, falls, workup in progress,  which precludes a safe discharge. Discharge to SNF, when medically stable.  Subjective: No significant events overnight, patient has significant bilateral upper and  lower weakness, does not feel any improvement. Patient was sitting on the recliner, denied any chest pain or palpitation, no shortness of breath.   Physical Exam: General: NAD, lying comfortably Appear in no distress, affect appropriate Eyes: PERRLA ENT: Oral Mucosa Clear, moist  Neck: no JVD,  Cardiovascular: S1 and S2 Present, no Murmur,  Respiratory: good respiratory effort, Bilateral Air entry equal and Decreased, no Crackles, no wheezes Abdomen: Bowel Sound present, Soft and no tenderness,  Skin: no rashes Extremities: no Pedal edema, no calf tenderness Neurologic: Bilateral lower and upper extremity weakness power 3-4/5 Gait not checked due to patient safety concerns  Vitals:   06/04/23 2339 06/05/23 0400 06/05/23 0744 06/05/23 1251  BP: (!) 108/56 116/62 120/63 (!) 98/56  Pulse: 61 61 (!) 59 60  Resp:  17 16 16   Temp: 98.4 F (36.9 C) 97.8 F (36.6 C) 97.6 F (36.4 C) (!) 97.4 F (36.3 C)  TempSrc: Oral Oral Oral   SpO2:  96% 97% 98%  Weight:      Height:        Intake/Output Summary (Last 24 hours) at 06/05/2023 1525 Last data filed at 06/05/2023 1113 Gross per 24 hour  Intake 550 ml  Output 1050 ml  Net -500 ml   Filed Weights   06/01/23 1421  Weight: 76.4 kg    Data Reviewed: I have personally reviewed and interpreted daily labs, tele strips, imagings as discussed above. I reviewed all nursing notes, pharmacy notes, vitals, pertinent old records I have discussed plan of care as described above with RN and patient/family.  CBC: Recent Labs  Lab 06/01/23 1426 06/02/23 0511 06/04/23 0416 06/05/23 0519  WBC 5.0 7.1 5.1 4.4  HGB 10.6* 11.3* 10.4* 10.7*  HCT 31.2* 33.2* 31.4* 31.3*  MCV 93.1 93.3 93.7 93.2  PLT 221 224 209 194   Basic Metabolic Panel: Recent Labs  Lab 06/01/23 1426 06/02/23 0511 06/03/23 0550 06/04/23 0416 06/05/23 0519  NA 137 138 136 134* 135  K 3.6 2.7* 4.0 4.0 3.9  CL 104 109 104 101 101  CO2 25 23 25 26 26   GLUCOSE  116* 86 103* 91 86  BUN 21 18 19 22 20   CREATININE 0.63 0.45* 0.55* 0.60* 0.52*  CALCIUM 8.7* 7.3* 8.3* 8.3* 8.5*  MG  --  1.3* 1.7 1.9 1.9  PHOS  --   --  3.0 3.3 3.5    Studies: No results found.  Scheduled Meds:  apixaban  5 mg Oral BID   bisacodyl  10 mg Oral QHS   clopidogrel  75 mg Oral Daily   finasteride  5 mg Oral Daily   folic acid  1 mg Oral Daily   [START ON 06/08/2023] iron polysaccharides  150 mg Oral Daily   pantoprazole  40 mg Oral Daily   polyethylene glycol  17 g Oral BID   predniSONE  40 mg Oral Q breakfast   tamsulosin  0.4 mg Oral QHS   zinc sulfate  220 mg Oral Daily   Continuous Infusions:  iron sucrose 200 mg (06/05/23 1038)   PRN Meds: acetaminophen **OR** acetaminophen, bisacodyl, ondansetron **OR** ondansetron (ZOFRAN) IV, senna-docusate  Time spent: 35 minutes  Author: Gillis Santa. MD [facility] 06/05/2023 3:25 PM  To reach On-call, see  care teams to locate the attending and reach out to them via [email or web address]. If 7PM-7AM, please contact night-coverage If you still have difficulty reaching the attending provider, please page the [facility] (Director on Call) for [facility] on amion for assistance.

## 2023-06-05 NOTE — Progress Notes (Signed)
Physical Therapy Treatment Patient Details Name: [name] MRN: [id number] DOB: Jun 26, 1933 Today's Date: 06/05/2023   History of Present Illness Patient is a 89 year old male with history of anxiety, depression, A-fib on Eliquis, hypertension, hyperlipidemia, history of sick sinus syndrome status post pacemaker placement, history of B12 deficiency, who presents to the emergency department for chief concerns of generalized weakness, worsening at assisted living facility. Current MD assessment includes: Generalized weakness, chest pain, a-fib, HLD, and hypokalemia.    PT Comments  Pt was pleasant and motivated to participate during the session and put forth good effort throughout. Pt continues to need Min A for bed mobility. Pt perform multiple STS from varying heights and surfaces with Min- Mod A depending on surface height. Pt able to amb with RW and CGA to recliner from EOB for activity and back to bed at end of session. When performing final STS, pt able to march in place for ~10 seconds before amb back to bed. Finished up session with various bed exercises and education of benefits of frequent mobility throughout the day, pt agreeable and motivated. Pt will benefit from continued PT services upon discharge to safely address deficits listed in patient problem list for decreased caregiver assistance and eventual return to PLOF.      If plan is discharge home, recommend the following: A little help with walking and/or transfers;A little help with bathing/dressing/bathroom;Assistance with cooking/housework;Assist for transportation;Help with stairs or ramp for entrance   Can travel by private vehicle     No  Equipment Recommendations  Other (comment) (TBD at next venue of care)    Recommendations for Other Services       Precautions / Restrictions Precautions Precautions: Fall Required Braces or Orthoses: Other Brace Other Brace: Pt has BIL wrist braces on bedside; states he wears them  at night for carpal tunnel pain control Restrictions Weight Bearing Restrictions: No     Mobility  Bed Mobility Overal bed mobility: Needs Assistance       Supine to sit: Min assist, HOB elevated, Used rails          Transfers Overall transfer level: Needs assistance Equipment used: Rolling walker (2 wheels) Transfers: Sit to/from Stand Sit to Stand: From elevated surface, Min assist, Mod assist           General transfer comment: performed multiple STS from various heights and surfaces. initial STS was Min A from elevated EOB. performed additional 2x STS from recliner with pillow on seat, both requiring Mod A; first recliner attempt was difficult for pt, the second attempt a greater initial boost was given with better results for pt to stand fully upright.    Ambulation/Gait Ambulation/Gait assistance: Contact guard assist Gait Distance (Feet): 3 Feet x2 Assistive device: Rolling walker (2 wheels) Gait Pattern/deviations: Decreased step length - right, Decreased step length - left, Decreased stride length, Step-through pattern Gait velocity: decreased     General Gait Details: able to walk 3 feet x2 from bed to recliner, then back to bed at end of session   Stairs             Wheelchair Mobility     Tilt Bed    Modified Rankin (Stroke Patients Only)       Balance Overall balance assessment: Needs assistance Sitting-balance support: Feet supported, No upper extremity supported Sitting balance-Leahy Scale: Good     Standing balance support: Reliant on assistive device for balance, Bilateral upper extremity supported, During functional activity Standing balance-Leahy Scale: Fair  Standing balance comment: static standing with RW                            Cognition Arousal: Alert Behavior During Therapy: WFL for tasks assessed/performed Overall Cognitive Status: Within Functional Limits for tasks assessed                                           Exercises Total Joint Exercises Ankle Circles/Pumps: AROM, Strengthening, Both, 10 reps Quad Sets: AROM, Strengthening, Both, 10 reps Gluteal Sets: AROM, Both, 10 reps, Strengthening Marching in Standing: AROM, Both, Strengthening, 10 reps Other Exercises Other Exercises: Performed various bed exercises at end of session, with accompanying education on benefits of frequent mobility throughout the day to pt's tolerance in order to maintain functional muscle activity    General Comments        Pertinent Vitals/Pain Pain Assessment Pain Assessment: Faces Faces Pain Scale: No hurt    Home Living                          Prior Function            PT Goals (current goals can now be found in the care plan section) Progress towards PT goals: Progressing toward goals    Frequency    Min 1X/week      PT Plan      Co-evaluation              AM-PAC PT "6 Clicks" Mobility   Outcome Measure  Help needed turning from your back to your side while in a flat bed without using bedrails?: A Little Help needed moving from lying on your back to sitting on the side of a flat bed without using bedrails?: A Little Help needed moving to and from a bed to a chair (including a wheelchair)?: A Little Help needed standing up from a chair using your arms (e.g., wheelchair or bedside chair)?: A Little Help needed to walk in hospital room?: A Lot Help needed climbing 3-5 steps with a railing? : A Lot 6 Click Score: 16    End of Session Equipment Utilized During Treatment: Gait belt Activity Tolerance: Patient tolerated treatment well Patient left: with call bell/phone within reach;in bed;with bed alarm set Nurse Communication: Mobility status PT Visit Diagnosis: Unsteadiness on feet (R26.81);Muscle weakness (generalized) (M62.81);Difficulty in walking, not elsewhere classified (R26.2)     Time: 7253-6644 PT Time Calculation (min) (ACUTE ONLY):  21 min  Charges:                            Cecile Sheerer, SPT 06/05/23, 3:09 PM

## 2023-06-05 NOTE — Progress Notes (Signed)
Occupational Therapy Treatment Patient Details Name: [name] MRN: [id number] DOB: 1932-11-09 Today's Date: 06/05/2023   History of present illness Patient is a 89 year old male with history of anxiety, depression, A-fib on Eliquis, hypertension, hyperlipidemia, history of sick sinus syndrome status post pacemaker placement, history of B12 deficiency, who presents to the emergency department for chief concerns of generalized weakness, worsening at assisted living facility. Current MD assessment includes: Generalized weakness, chest pain, a-fib, HLD, and hypokalemia.   OT comments  Pt received semi-reclined in bed, eating breakfast, having to flex neck significantly to reach food due to shoulder weakness. Appearing alert; willing to work with OT on grooming and t/f to recliner. T/f attempted with +1, but unable to fully stand; +2 called for assist; pt MOD A x2 to stand, then CGA for t/f to recliner. See flowsheet below for further details of session. Left seated in recliner with all needs in reach.  Patient will benefit from continued OT while in acute care.       If plan is discharge home, recommend the following:  A lot of help with walking and/or transfers;A lot of help with bathing/dressing/bathroom;Assistance with cooking/housework;Assistance with feeding;Direct supervision/assist for medications management;Direct supervision/assist for financial management;Assist for transportation;Help with stairs or ramp for entrance   Equipment Recommendations  Other (comment) (defer)    Recommendations for Other Services      Precautions / Restrictions Precautions Precautions: Fall Restrictions Weight Bearing Restrictions: No       Mobility Bed Mobility Overal bed mobility: Needs Assistance Bed Mobility: Supine to Sit     Supine to sit: Min assist, HOB elevated, Used rails     General bed mobility comments: good sitting balance at EOB    Transfers Overall transfer level: Needs  assistance Equipment used: Rolling walker (2 wheels) Transfers: Sit to/from Stand Sit to Stand: Mod assist, +2 physical assistance, From elevated surface           General transfer comment: First attempt with MAX A x1 unsuccessful, as pt unable to gain balance while standing (continued to lean backwards); +2 called for next attempt for safety.     Balance Overall balance assessment: Needs assistance Sitting-balance support: Feet supported, No upper extremity supported Sitting balance-Leahy Scale: Good     Standing balance support: Reliant on assistive device for balance, Bilateral upper extremity supported, During functional activity Standing balance-Leahy Scale: Fair Standing balance comment: static standing with RW                           ADL either performed or assessed with clinical judgement   ADL Overall ADL's : Needs assistance/impaired Eating/Feeding: Set up;Supervision/ safety;Sitting Eating/Feeding Details (indicate cue type and reason): set up from seated EOB with pillow supporting R elbow due to shoulder weakness Grooming: Minimal assistance;Brushing hair;Set up;Oral care Grooming Details (indicate cue type and reason): OT supporting RUE at elbow while pt combing hair.                               General ADL Comments: Pt continues to have significant BIL shoulder weakness which causes significant ADL deficits, especially with LB tasks. Anticipate overall MOD-MAX A ADL performance.    Extremity/Trunk Assessment Upper Extremity Assessment Upper Extremity Assessment: Generalized weakness (continues to have BIL shoulder weakness)   Lower Extremity Assessment Lower Extremity Assessment: Defer to PT evaluation;Generalized weakness  Vision       Perception     Praxis      Cognition Arousal: Alert Behavior During Therapy: WFL for tasks assessed/performed Overall Cognitive Status: Within Functional Limits for tasks assessed                                           Exercises      Shoulder Instructions       General Comments Pt on room air; intermittently eating breakfast during session. Able to sit up x10 minutes at EOB to finish breakfast; good trunk control; needed pillows under R elbow to support for improved self-feeding. Needing OT support at R elbow for combing hair.    Pertinent Vitals/ Pain       Pain Assessment Pain Assessment: No/denies pain  Home Living                                          Prior Functioning/Environment              Frequency  Min 1X/week        Progress Toward Goals  OT Goals(current goals can now be found in the care plan section)  Progress towards OT goals: Progressing toward goals  Acute Rehab OT Goals Patient Stated Goal: Get stronger OT Goal Formulation: With patient Time For Goal Achievement: 06/17/23 Potential to Achieve Goals: Fair ADL Goals Pt Will Perform Grooming: with modified independence;sitting Pt Will Perform Upper Body Dressing: with modified independence;sitting Pt Will Transfer to Toilet: with supervision;bedside commode;ambulating Pt Will Perform Toileting - Clothing Manipulation and hygiene: with supervision;sit to/from stand  Plan      Co-evaluation                 AM-PAC OT "6 Clicks" Daily Activity     Outcome Measure   Help from another person eating meals?: A Little Help from another person taking care of personal grooming?: A Little Help from another person toileting, which includes using toliet, bedpan, or urinal?: A Lot Help from another person bathing (including washing, rinsing, drying)?: A Lot Help from another person to put on and taking off regular upper body clothing?: A Lot Help from another person to put on and taking off regular lower body clothing?: Total 6 Click Score: 13    End of Session Equipment Utilized During Treatment: Gait belt;Rolling walker (2  wheels)  OT Visit Diagnosis: Muscle weakness (generalized) (M62.81);History of falling (Z91.81)   Activity Tolerance Patient tolerated treatment well   Patient Left in chair;with call bell/phone within reach;with chair alarm set   Nurse Communication Mobility status        Time: 7829-5621 OT Time Calculation (min): 29 min  Charges: OT General Charges $OT Visit: 1 Visit OT Treatments $Self Care/Home Management : 23-37 mins  Linward Foster, MS, OTR/L  Alvester Morin 06/05/2023, 10:54 AM

## 2023-06-05 NOTE — NC FL2 (Signed)
[place] MEDICAID FL2 LEVEL OF CARE FORM     IDENTIFICATION  Patient Name: [name] Birthdate: Mar 09, 1933 Sex: male Admission Date (Current Location): 06/01/2023  [facility] and [place] Number:  Chiropodist and Address:  [facility], [address] [place], [place] [address]      Provider Number: [id number]  Attending Physician Name and Address:  Gillis Santa, MD  Relative Name and Phone Number:  Henrine Screws)  [phone] [facility])    Current Level of Care: Hospital Recommended Level of Care: Skilled Nursing Facility Prior Approval Number:    Date Approved/Denied:   PASRR Number: [id number]  Discharge Plan: SNF    Current Diagnoses: Patient Active Problem List   Diagnosis Date Noted   Acute on chronic heart failure (HCC) 06/01/2023   History of anemia due to vitamin B12 deficiency 06/01/2023   Elevated troponin 06/01/2023   Unintentional weight loss 06/01/2023   Atrial flutter (HCC) 02/28/2023   Bilateral hearing loss 02/28/2023   Cardiac pacemaker in situ 02/28/2023   Complete rotator cuff tear or rupture of left shoulder, not specified as traumatic 02/28/2023   Shortness of breath on exertion 02/28/2023   Bilateral carpal tunnel syndrome 11/08/2022   Bilateral hand numbness 01/31/2022   Neuralgia 10/13/2021   Neuropathy 10/13/2021   Hyperglycemia 10/13/2021   Plantar wart of right foot 06/30/2021   Aortic atherosclerosis (HCC) 08/26/2019   Sensory loss 05/20/2019   Adenoma of colon    Counseling regarding advanced directives 04/23/2014   Osteoarthritis of left shoulder 10/21/2013   GERD (gastroesophageal reflux disease) 02/09/2013   ACTINIC KERATOSIS 05/04/2010   Pacemaker  medtronic 02/06/2010   Sick sinus syndrome (HCC) 10/27/2009   Atrial fibrillation (HCC) 10/06/2009   HEPATIC CYST 10/06/2009   Hyperlipemia 07/12/2009   Essential hypertension, benign 07/12/2009   ALLERGIC RHINITIS 07/12/2009   BPH  with obstruction/lower urinary tract symptoms 07/12/2009   OSTEOARTHRITIS 07/12/2009    Orientation RESPIRATION BLADDER Height & Weight     Self, Place  Normal Continent Weight: 76.4 kg Height:  5\' 10"  (177.8 cm)  BEHAVIORAL SYMPTOMS/MOOD NEUROLOGICAL BOWEL NUTRITION STATUS  Other (Comment) (n/a)  (n/a) Continent Diet (Heart)  AMBULATORY STATUS COMMUNICATION OF NEEDS Skin   Limited Assist Verbally Bruising, Skin abrasions (Abrasion- bilateral ecchymosis- bilateral hand arms, skin tear- R posterior elbow)                       Personal Care Assistance Level of Assistance  Bathing, Dressing Bathing Assistance: Limited assistance   Dressing Assistance: Limited assistance     Functional Limitations Info  Sight, Hearing Sight Info: Impaired Hearing Info: Impaired      SPECIAL CARE FACTORS FREQUENCY  PT (By licensed PT), OT (By licensed OT)     PT Frequency: Min 2x weekly OT Frequency: Min 2x weeklhy            Contractures Contractures Info: Not present    Additional Factors Info  Code Status, Allergies Code Status Info: FULL Allergies Info: Augmentin (Amoxicillin-pot Clavulanate), Colchicine, Morphine And Codeine           Current Medications (06/05/2023):  This is the current hospital active medication list Current Facility-Administered Medications  Medication Dose Route Frequency Provider Last Rate Last Admin   acetaminophen (TYLENOL) tablet 650 mg  650 mg Oral Q6H PRN Cox, Amy N, DO   650 mg at 06/04/23 2043   Or   acetaminophen (TYLENOL) suppository 650 mg  650 mg  Rectal Q6H PRN Cox, Amy N, DO       apixaban (ELIQUIS) tablet 5 mg  5 mg Oral BID Cox, Amy N, DO   5 mg at 06/05/23 1037   bisacodyl (DULCOLAX) EC tablet 10 mg  10 mg Oral QHS Gillis Santa, MD       bisacodyl (DULCOLAX) suppository 10 mg  10 mg Rectal Daily PRN Gillis Santa, MD       clopidogrel (PLAVIX) tablet 75 mg  75 mg Oral Daily Cox, Amy N, DO   75 mg at 06/05/23 1037   finasteride  (PROSCAR) tablet 5 mg  5 mg Oral Daily Cox, Amy N, DO   5 mg at 06/05/23 1030   folic acid (FOLVITE) tablet 1 mg  1 mg Oral Daily Gillis Santa, MD   1 mg at 06/05/23 1037   iron sucrose (VENOFER) 200 mg in sodium chloride 0.9 % 100 mL IVPB  200 mg Intravenous Daily Gillis Santa, MD 440 mL/hr at 06/05/23 1038 200 mg at 06/05/23 1038   Followed by   Melene Muller ON 06/08/2023] iron polysaccharides (NIFEREX) capsule 150 mg  150 mg Oral Daily Gillis Santa, MD       ondansetron (ZOFRAN) tablet 4 mg  4 mg Oral Q6H PRN Cox, Amy N, DO       Or   ondansetron (ZOFRAN) injection 4 mg  4 mg Intravenous Q6H PRN Cox, Amy N, DO       polyethylene glycol (MIRALAX / GLYCOLAX) packet 17 g  17 g Oral BID Gillis Santa, MD   17 g at 06/05/23 1037   senna-docusate (Senokot-S) tablet 1 tablet  1 tablet Oral QHS PRN Cox, Amy N, DO       tamsulosin (FLOMAX) capsule 0.4 mg  0.4 mg Oral QHS Cox, Amy N, DO   0.4 mg at 06/04/23 2043   zinc sulfate capsule 220 mg  220 mg Oral Daily Gillis Santa, MD   220 mg at 06/05/23 1037     Discharge Medications: Please see discharge summary for a list of discharge medications.  Relevant Imaging Results:  Relevant Lab Results:   Additional Information SS# [id number]  Truddie Hidden, RN

## 2023-06-06 DIAGNOSIS — R0602 Shortness of breath: Secondary | ICD-10-CM | POA: Diagnosis not present

## 2023-06-06 LAB — BASIC METABOLIC PANEL
Anion gap: 9 (ref 5–15)
BUN: 22 mg/dL (ref 8–23)
CO2: 25 mmol/L (ref 22–32)
Calcium: 8.7 mg/dL — ABNORMAL LOW (ref 8.9–10.3)
Chloride: 100 mmol/L (ref 98–111)
Creatinine, Ser: 0.48 mg/dL — ABNORMAL LOW (ref 0.61–1.24)
GFR, Estimated: >60 mL/min (ref >60)
Glucose, Bld: 87 mg/dL (ref 70–99)
Potassium: 3.7 mmol/L (ref 3.5–5.1)
Sodium: 134 mmol/L — ABNORMAL LOW (ref 135–145)

## 2023-06-06 LAB — CBC
HCT: 31.9 % — ABNORMAL LOW (ref 39.0–52.0)
Hemoglobin: 10.8 g/dL — ABNORMAL LOW (ref 13.0–17.0)
MCH: 31.2 pg (ref 26.0–34.0)
MCHC: 33.9 g/dL (ref 30.0–36.0)
MCV: 92.2 fL (ref 80.0–100.0)
Platelets: 219 10*3/uL (ref 150–400)
RBC: 3.46 MIL/uL — ABNORMAL LOW (ref 4.22–5.81)
RDW: 15.1 % (ref 11.5–15.5)
WBC: 4.9 10*3/uL (ref 4.0–10.5)
nRBC: 0 % (ref 0.0–0.2)

## 2023-06-06 LAB — MAG INTERPRETATION REFLEXED

## 2023-06-06 LAB — LYME DISEASE SEROLOGY W/REFLEX: Lyme Total Antibody EIA: NEGATIVE

## 2023-06-06 LAB — PHOSPHORUS: Phosphorus: 3 mg/dL (ref 2.5–4.6)

## 2023-06-06 LAB — MAGNESIUM: Magnesium: 1.9 mg/dL (ref 1.7–2.4)

## 2023-06-06 LAB — MAG IGM ANTIBODIES: MAG IgM Antibodies: <900 BTU (ref 0–999)

## 2023-06-06 MED ORDER — ONDANSETRON HCL 4 MG/2ML IJ SOLN: 4.0000 mg | Freq: Four times a day (QID) | INTRAMUSCULAR | Status: DC | PRN

## 2023-06-06 MED ORDER — ACETAMINOPHEN 325 MG PO TABS: 650.0000 mg | ORAL_TABLET | Freq: Four times a day (QID) | ORAL | Status: DC | PRN

## 2023-06-06 MED ORDER — ONDANSETRON HCL 4 MG PO TABS: 4.0000 mg | ORAL_TABLET | Freq: Four times a day (QID) | ORAL | Status: DC | PRN

## 2023-06-06 MED ORDER — BISACODYL 5 MG PO TBEC
10.0000 mg | DELAYED_RELEASE_TABLET | Freq: Two times a day (BID) | ORAL | Status: DC
  Filled 2023-06-06 (×2): qty 2

## 2023-06-06 NOTE — Plan of Care (Signed)

## 2023-06-06 NOTE — TOC Progression Note (Signed)
Transition of Care [facility]) - Progression Note    Patient Details  Name: [name] MRN: [id number] Date of Birth: 12/07/1932  Transition of Care [facility]) CM/SW Contact  Margarito Liner, LCSW Phone Number: 06/06/2023, 4:13 PM  Clinical Narrative:   Insurance authorization still pending.  Expected Discharge Plan and Services                                               Social Determinants of Health (SDOH) Interventions SDOH Screenings   Depression (PHQ2-9): Medium Risk (03/06/2023)  Tobacco Use: Medium Risk (06/01/2023)    Readmission Risk Interventions     No data to display

## 2023-06-06 NOTE — Plan of Care (Signed)
  Problem: Education: Goal: Knowledge of General Education information will improve Description: Including pain rating scale, medication(s)/side effects and non-pharmacologic comfort measures Outcome: Progressing   Problem: Pain Managment: Goal: General experience of comfort will improve Outcome: Progressing   Problem: Nutrition: Goal: Adequate nutrition will be maintained Outcome: Progressing   Problem: Clinical Measurements: Goal: Respiratory complications will improve Outcome: Progressing   Problem: Clinical Measurements: Goal: Cardiovascular complication will be avoided Outcome: Progressing

## 2023-06-06 NOTE — Progress Notes (Signed)
Neurology progress note  S: Patient reports no change in weakness. Reports that it has been going on for at least 8 months or longer, and getting progressively worse.  O:  Vitals:   06/05/23 2340 06/06/23 0319  BP: (!) 112/59 114/61  Pulse: 61 (!) 59  Resp: 17 16  Temp: 98.6 F (37 C) 97.8 F (36.6 C)  SpO2: 98% 96%   Physical Exam  Constitutional: Appears cachectic Psych: Pleasant and cooperative Eyes: No scleral injection HENT: No oropharyngeal obstruction.  MSK: no major joint deformities, diffuse muscle wasting.  Cardiovascular: Normal rate and regular rhythm. Perfusing extremities well Respiratory: Effort normal, non-labored breathing GI: Soft.  No distension. There is no tenderness.  Skin: Warm dry and intact visible skin with scattered bruises.  Edema of the ankles bilaterally   Neuro: Mental Status: Patient is awake, alert, oriented to person, place, month, year, and situation. Patient is able to give a clear and coherent history, though inconsistent on time course of events compared to chart at times Mild difficulty with naming at times.  No neglect Cranial Nerves: II: Visual Fields are full. Pupils are equal, round, and reactive to light.   III,IV, VI: EOMI without ptosis or diploplia.  Subtle nystagmus felt to be and gaze V: Facial sensation is symmetric to light touch VII: Facial movement is symmetric.  VIII: hearing is hard of hearing at baseline X: Uvula elevates symmetrically XII: tongue is midline without fasciculations or atrophy.  Hoarse voice Motor: Diffuse muscle wasting throughout.  He is weaker proximally than distally with 1/5 deltoids bilaterally and 3/5 hip flexion bilaterally (able to raise briefly antigravity but cannot maintain it for more than 1 to 2 seconds).  Additionally more distally he has an upper motor neuron pattern of weakness with extension affected more than flexion in the upper extremities and flexion affected more than extension in  the lower extremities.  The left knee flexion is slightly stronger than right knee flexion Sensory: He reports sensation is intact to temperature throughout.  Vibration and proprioception is impaired in all 4 extremities Deep Tendon Reflexes: 2+ and symmetric in the brachioradialis and patellae.  Plantars: Toes are downgoing bilaterally.  Cerebellar: Finger-to-nose intact bilaterally.  Heel-to-shin  Gait:  Deferred for safety given significant weakness  Data  CT c/a/p  1. Indeterminate 1 cm diameter ground-glass nodule in the left lung apex. Neoplasm is not excluded. Initial follow-up with CT at 6 months is recommended to confirm persistence. If persistent, repeat CT is recommended every 2 years until 5 years of stability has been established. This recommendation follows the consensus statement: Guidelines for Management of Incidental Pulmonary Nodules Detected on CT Images: From the Fleischner Society 2017; Radiology 2017; 284:228-243. 2. Diffuse interstitial pattern to the lungs most prominent in the periphery, likely edema. Small bilateral pleural effusions. 3. Cardiac enlargement. 4. Aortic atherosclerosis. 5. Small abdominal ascites. 6. Prostate is enlarged.  CT c/t/l spine 1. No acute finding in the cervical, thoracic, or lumbar spine. 2. Multilevel uncovertebral ridging and facet arthropathy throughout the cervical spine as detailed above resulting in up to moderate to severe bilateral neural foraminal stenosis C4-C5. No high-grade spinal canal stenosis at any level in the cervical spine. 3. Mild degenerative changes in the thoracic spine without high-grade spinal canal or neural foraminal stenosis. 4. Multilevel disc and facet degeneration throughout the lumbar spine as detailed above resulting in up to moderate left neural foraminal stenosis at L4-L5 and L5-S1. No high-grade spinal canal stenosis at any level.  CNS imaging personally reviewed; I agree with above  interpretation  CK, Aldolase, CRP, ESR, HIV, ANA unremarkable, numerous labs still pending.  A/P: 89 yo patient presents with 8+ month of progressive weakness now with muscle wasting with intact reflexes and CNS imaging without identifiable etiology of sx. At this point further workup should be directed by EMG/NCS which will have to be done as an outpatient. - F/u outstanding labs - Placement - I will arrange for EMG/NCS and further workup as outpatient.  Neurology will f/u outstanding labs and will otherwise be available for questions going forward.  Bing Neighbors, MD [facility] [phone]  If 7pm- 7am, please page neurology on call as listed in AMION.

## 2023-06-06 NOTE — Progress Notes (Addendum)
[facility] Progress Note  Patient: [name]    ZOX:[id number]  DOA: 06/01/2023     Date of Service: the patient was seen and examined on 06/06/2023  Chief Complaint  Patient presents with   Weakness   Brief hospital course: Mr. Dareld Felch is a 89 year old male with history of anxiety, depression, A-fib on Eliquis, hypertension, history of sick sinus syndrome status post pacemaker placement, CAD with recent stents who presented to the emergency department for chief concerns of generalized weakness, worsening at assisted living facility.    Assessment and Plan: Generalized weakness weakness reportedly developed very quickly in just the last 6 months or so, when previously he was able to golf and walk up stairs, now he can't lift up his legs against gravity.  No muscle pain. Neurology consulted, workup so far negative, recommended EMG/NCS as an outpatient MRI brain and MRI cervical spine could not be done here at [facility] due to pacemaker, patient may need to follow as an outpatient with neurology if MRI needs to be done then patient is to go to [facility] health. Empirically started prednisone 40 mg p.o. daily for 3 days PT/OT eval recommend SNF placement Lyme disease serology negative Follow-up with neurology as an outpatient  Chest pain, ACS ruled out CAD  --pt had DES to LCx and OM in April 2024 at [facility]; repeat cath patent stents in 01/2023  --trop 40's flat. --cardiology consulted, recommended no intervention --Echo LVEF 60 to 65%, moderate LVH, no any other significant findings --cont statin and plavix (not on ASA due to Eliquis)   Unintentional weight loss --pt reported eating well.  Had colonoscopy back in 2019 with polyps removed.  Had recent lung imaging at [facility] with no report of malignancy. -- PSA  0.39 wnl CT c/a/p: 1. Indeterminate 1 cm diameter ground-glass nodule in the left lung apex. Neoplasm is not excluded. Initial follow-up with CT at 6 months is recommended to  confirm persistence. If persistent, repeat CT is recommended every 2 years until 5 years of stability has been established. 2. Diffuse interstitial pattern to the lungs most prominent in the periphery, likely edema. Small bilateral pleural effusions. 3. Cardiac enlargement. 4. Aortic atherosclerosis. 5. Small abdominal ascites.  6. Prostate is enlarged.   History of anemia due to vitamin B12 deficiency --Vit B12 771 wnl    Sick sinus syndrome: s/p pacemaker   Atrial fibrillation: --cont home Eliquis.  Patient is not on Cardizem which has been discontinued   Hyperlipemia: cont home statin   Hypokalemia, Resolved. K repleted --monitor and supplement PRN  Iron deficiency, transferrin saturation 16%, slightly low, started Venofer followed by oral iron supplement. Folic acid 6.1 at lower end,  Started oral supplement. Zinc deficiency, started oral zinc supplement.   Pulmonary nodule CT chest:  Indeterminate 1 cm diameter ground-glass nodule in the left lung apex. Neoplasm is not excluded. Initial follow-up with CT at 6 months is recommended to confirm persistence. If persistent, repeat CT is recommended every 2 years until 5 years of stability has been established.  Follow-up as an outpatient with PCP and pulmonologist  Constipation: continue laxatives.  Body mass index is 24.17 kg/m.  Interventions:  Diet: Heart healthy diet DVT Prophylaxis: Subcutaneous Lovenox   Advance goals of care discussion: Full code  Family Communication: family was not present at bedside, at the time of interview.  The pt provided permission to discuss medical plan with the family. Opportunity was given to ask question and all questions were answered  satisfactorily.   Disposition:  Pt is from Home, admitted with generalized weakness, falls, workup in progress,  which precludes a safe discharge. Discharge to SNF, when medically stable.  Awaiting for insurance Auth as per TOC  Subjective: No  significant events overnight, patient still feels same weakness in upper and lower extremities, patient did receive prednisone 40 mg yesterday and today, does not feel any improvement.  Patient denies any new complaints.  Resting comfortably in the bed. Patient was advised that MRI cannot be done due to pacemaker but he cannot get it done as an outpatient if needed after follow-up with neurology.  Physical Exam: General: NAD, lying comfortably Appear in no distress, affect appropriate Eyes: PERRLA ENT: Oral Mucosa Clear, moist  Neck: no JVD,  Cardiovascular: S1 and S2 Present, no Murmur,  Respiratory: good respiratory effort, Bilateral Air entry equal and Decreased, no Crackles, no wheezes Abdomen: Bowel Sound present, Soft and no tenderness,  Skin: no rashes Extremities: no Pedal edema, no calf tenderness Neurologic: Bilateral lower and upper extremity weakness power 3-4/5 Gait not checked due to patient safety concerns  Vitals:   06/05/23 2340 06/06/23 0319 06/06/23 0805 06/06/23 1522  BP: (!) 112/59 114/61 121/61 117/65  Pulse: 61 (!) 59 60 (!) 59  Resp: 17 16 16 16   Temp: 98.6 F (37 C) 97.8 F (36.6 C) 97.8 F (36.6 C) 98 F (36.7 C)  TempSrc:  Oral    SpO2: 98% 96% 98% 100%  Weight:      Height:        Intake/Output Summary (Last 24 hours) at 06/06/2023 1531 Last data filed at 06/06/2023 1100 Gross per 24 hour  Intake 120 ml  Output 875 ml  Net -755 ml   Filed Weights   06/01/23 1421  Weight: 76.4 kg    Data Reviewed: I have personally reviewed and interpreted daily labs, tele strips, imagings as discussed above. I reviewed all nursing notes, pharmacy notes, vitals, pertinent old records I have discussed plan of care as described above with RN and patient/family.  CBC: Recent Labs  Lab 06/01/23 1426 06/02/23 0511 06/04/23 0416 06/05/23 0519 06/06/23 0522  WBC 5.0 7.1 5.1 4.4 4.9  HGB 10.6* 11.3* 10.4* 10.7* 10.8*  HCT 31.2* 33.2* 31.4* 31.3* 31.9*  MCV  93.1 93.3 93.7 93.2 92.2  PLT 221 224 209 194 219   Basic Metabolic Panel: Recent Labs  Lab 06/02/23 0511 06/03/23 0550 06/04/23 0416 06/05/23 0519 06/06/23 0522  NA 138 136 134* 135 134*  K 2.7* 4.0 4.0 3.9 3.7  CL 109 104 101 101 100  CO2 23 25 26 26 25   GLUCOSE 86 103* 91 86 87  BUN 18 19 22 20 22   CREATININE 0.45* 0.55* 0.60* 0.52* 0.48*  CALCIUM 7.3* 8.3* 8.3* 8.5* 8.7*  MG 1.3* 1.7 1.9 1.9 1.9  PHOS  --  3.0 3.3 3.5 3.0    Studies: No results found.  Scheduled Meds:  apixaban  5 mg Oral BID   bisacodyl  10 mg Oral QHS   clopidogrel  75 mg Oral Daily   finasteride  5 mg Oral Daily   folic acid  1 mg Oral Daily   [START ON 06/08/2023] iron polysaccharides  150 mg Oral Daily   pantoprazole  40 mg Oral Daily   polyethylene glycol  17 g Oral BID   predniSONE  40 mg Oral Q breakfast   tamsulosin  0.4 mg Oral QHS   zinc sulfate  220 mg Oral Daily  Continuous Infusions:  iron sucrose 200 mg (06/06/23 1235)   PRN Meds: acetaminophen **OR** acetaminophen, bisacodyl, ondansetron **OR** ondansetron (ZOFRAN) IV, senna-docusate  Time spent: 35 minutes  Author: Gillis Santa. MD [facility] 06/06/2023 3:31 PM  To reach On-call, see care teams to locate the attending and reach out to them via [email or web address]. If 7PM-7AM, please contact night-coverage If you still have difficulty reaching the attending provider, please page the [facility] (Director on Call) for [facility] on amion for assistance.

## 2023-06-06 NOTE — TOC Progression Note (Addendum)
Transition of Care [facility]) - Progression Note    Patient Details  Name: [name] MRN: [id number] Date of Birth: 1932-10-25  [facility] CM/SW Contact  Truddie Hidden, RN Phone Number: 06/06/2023, 10:44 AM  Clinical Narrative:    Attempt to reach patient's Grandson to discuss bed offers. No answer. Left a message.   Spoke with Sue Lush at [facility] No beds are available at this time.   Spoke with Jeanice Lim, Case Manager at [facility] . Referral was received but will not be reviewed before tomorrow.   Spoke with patient's grandson to give bed offer for [facility]. He is agreeable tobed offer. He was advised Berk[facility] Harvey would still be required.   1:35pm Vesta Mixer started and is pending .        Expected Discharge Plan and Services                                               Social Determinants of Health (SDOH) Interventions SDOH Screenings   Depression (PHQ2-9): Medium Risk (03/06/2023)  Tobacco Use: Medium Risk (06/01/2023)    Readmission Risk Interventions     No data to display

## 2023-06-07 DIAGNOSIS — I495 Sick sinus syndrome: Secondary | ICD-10-CM | POA: Diagnosis present

## 2023-06-07 DIAGNOSIS — N400 Enlarged prostate without lower urinary tract symptoms: Secondary | ICD-10-CM | POA: Diagnosis present

## 2023-06-07 DIAGNOSIS — J9809 Other diseases of bronchus, not elsewhere classified: Secondary | ICD-10-CM | POA: Diagnosis not present

## 2023-06-07 DIAGNOSIS — D649 Anemia, unspecified: Secondary | ICD-10-CM | POA: Diagnosis not present

## 2023-06-07 DIAGNOSIS — E785 Hyperlipidemia, unspecified: Secondary | ICD-10-CM | POA: Diagnosis not present

## 2023-06-07 DIAGNOSIS — I2699 Other pulmonary embolism without acute cor pulmonale: Secondary | ICD-10-CM | POA: Diagnosis not present

## 2023-06-07 DIAGNOSIS — Z7901 Long term (current) use of anticoagulants: Secondary | ICD-10-CM | POA: Diagnosis not present

## 2023-06-07 DIAGNOSIS — E119 Type 2 diabetes mellitus without complications: Secondary | ICD-10-CM | POA: Diagnosis not present

## 2023-06-07 DIAGNOSIS — M25551 Pain in right hip: Secondary | ICD-10-CM | POA: Diagnosis not present

## 2023-06-07 DIAGNOSIS — N3000 Acute cystitis without hematuria: Secondary | ICD-10-CM | POA: Diagnosis not present

## 2023-06-07 DIAGNOSIS — R918 Other nonspecific abnormal finding of lung field: Secondary | ICD-10-CM | POA: Diagnosis not present

## 2023-06-07 DIAGNOSIS — R300 Dysuria: Secondary | ICD-10-CM | POA: Diagnosis not present

## 2023-06-07 DIAGNOSIS — I4891 Unspecified atrial fibrillation: Secondary | ICD-10-CM | POA: Diagnosis not present

## 2023-06-07 DIAGNOSIS — I10 Essential (primary) hypertension: Secondary | ICD-10-CM | POA: Diagnosis not present

## 2023-06-07 DIAGNOSIS — I2510 Atherosclerotic heart disease of native coronary artery without angina pectoris: Secondary | ICD-10-CM | POA: Diagnosis not present

## 2023-06-07 DIAGNOSIS — J309 Allergic rhinitis, unspecified: Secondary | ICD-10-CM | POA: Diagnosis present

## 2023-06-07 DIAGNOSIS — M6259 Muscle wasting and atrophy, not elsewhere classified, multiple sites: Secondary | ICD-10-CM | POA: Diagnosis not present

## 2023-06-07 DIAGNOSIS — Z95811 Presence of heart assist device: Secondary | ICD-10-CM | POA: Diagnosis not present

## 2023-06-07 DIAGNOSIS — J45901 Unspecified asthma with (acute) exacerbation: Secondary | ICD-10-CM | POA: Diagnosis not present

## 2023-06-07 DIAGNOSIS — R110 Nausea: Secondary | ICD-10-CM | POA: Diagnosis not present

## 2023-06-07 DIAGNOSIS — K219 Gastro-esophageal reflux disease without esophagitis: Secondary | ICD-10-CM | POA: Diagnosis not present

## 2023-06-07 DIAGNOSIS — H9190 Unspecified hearing loss, unspecified ear: Secondary | ICD-10-CM | POA: Diagnosis present

## 2023-06-07 DIAGNOSIS — M6281 Muscle weakness (generalized): Secondary | ICD-10-CM | POA: Diagnosis not present

## 2023-06-07 DIAGNOSIS — Z87891 Personal history of nicotine dependence: Secondary | ICD-10-CM | POA: Diagnosis not present

## 2023-06-07 DIAGNOSIS — Z961 Presence of intraocular lens: Secondary | ICD-10-CM | POA: Diagnosis present

## 2023-06-07 DIAGNOSIS — Z950 Presence of cardiac pacemaker: Secondary | ICD-10-CM | POA: Diagnosis not present

## 2023-06-07 DIAGNOSIS — R1310 Dysphagia, unspecified: Secondary | ICD-10-CM | POA: Diagnosis not present

## 2023-06-07 DIAGNOSIS — Z8249 Family history of ischemic heart disease and other diseases of the circulatory system: Secondary | ICD-10-CM | POA: Diagnosis not present

## 2023-06-07 DIAGNOSIS — Q791 Other congenital malformations of diaphragm: Secondary | ICD-10-CM | POA: Diagnosis not present

## 2023-06-07 DIAGNOSIS — E782 Mixed hyperlipidemia: Secondary | ICD-10-CM | POA: Diagnosis not present

## 2023-06-07 DIAGNOSIS — J439 Emphysema, unspecified: Secondary | ICD-10-CM | POA: Diagnosis not present

## 2023-06-07 DIAGNOSIS — Z66 Do not resuscitate: Secondary | ICD-10-CM | POA: Diagnosis present

## 2023-06-07 DIAGNOSIS — L89612 Pressure ulcer of right heel, stage 2: Secondary | ICD-10-CM | POA: Diagnosis present

## 2023-06-07 DIAGNOSIS — R54 Age-related physical debility: Secondary | ICD-10-CM | POA: Diagnosis present

## 2023-06-07 DIAGNOSIS — R634 Abnormal weight loss: Secondary | ICD-10-CM | POA: Diagnosis not present

## 2023-06-07 DIAGNOSIS — Z681 Body mass index (BMI) 19 or less, adult: Secondary | ICD-10-CM | POA: Diagnosis not present

## 2023-06-07 DIAGNOSIS — Z743 Need for continuous supervision: Secondary | ICD-10-CM | POA: Diagnosis not present

## 2023-06-07 DIAGNOSIS — E038 Other specified hypothyroidism: Secondary | ICD-10-CM | POA: Diagnosis not present

## 2023-06-07 DIAGNOSIS — E559 Vitamin D deficiency, unspecified: Secondary | ICD-10-CM | POA: Diagnosis not present

## 2023-06-07 DIAGNOSIS — I959 Hypotension, unspecified: Secondary | ICD-10-CM | POA: Diagnosis present

## 2023-06-07 DIAGNOSIS — I480 Paroxysmal atrial fibrillation: Secondary | ICD-10-CM | POA: Diagnosis present

## 2023-06-07 DIAGNOSIS — R531 Weakness: Secondary | ICD-10-CM | POA: Diagnosis not present

## 2023-06-07 DIAGNOSIS — R0602 Shortness of breath: Secondary | ICD-10-CM | POA: Diagnosis not present

## 2023-06-07 DIAGNOSIS — E876 Hypokalemia: Secondary | ICD-10-CM | POA: Diagnosis present

## 2023-06-07 DIAGNOSIS — B370 Candidal stomatitis: Secondary | ICD-10-CM | POA: Diagnosis not present

## 2023-06-07 DIAGNOSIS — R051 Acute cough: Secondary | ICD-10-CM | POA: Diagnosis not present

## 2023-06-07 DIAGNOSIS — E43 Unspecified severe protein-calorie malnutrition: Secondary | ICD-10-CM | POA: Diagnosis present

## 2023-06-07 DIAGNOSIS — L89152 Pressure ulcer of sacral region, stage 2: Secondary | ICD-10-CM | POA: Diagnosis present

## 2023-06-07 DIAGNOSIS — L89622 Pressure ulcer of left heel, stage 2: Secondary | ICD-10-CM | POA: Diagnosis present

## 2023-06-07 DIAGNOSIS — D518 Other vitamin B12 deficiency anemias: Secondary | ICD-10-CM | POA: Diagnosis not present

## 2023-06-07 DIAGNOSIS — Z515 Encounter for palliative care: Secondary | ICD-10-CM | POA: Diagnosis not present

## 2023-06-07 DIAGNOSIS — N390 Urinary tract infection, site not specified: Secondary | ICD-10-CM | POA: Diagnosis not present

## 2023-06-07 DIAGNOSIS — R000 Tachycardia, unspecified: Secondary | ICD-10-CM | POA: Diagnosis not present

## 2023-06-07 DIAGNOSIS — R627 Adult failure to thrive: Secondary | ICD-10-CM | POA: Diagnosis present

## 2023-06-07 DIAGNOSIS — R2681 Unsteadiness on feet: Secondary | ICD-10-CM | POA: Diagnosis not present

## 2023-06-07 DIAGNOSIS — E569 Vitamin deficiency, unspecified: Secondary | ICD-10-CM | POA: Diagnosis not present

## 2023-06-07 LAB — CBC
HCT: 32.3 % — ABNORMAL LOW (ref 39.0–52.0)
Hemoglobin: 11 g/dL — ABNORMAL LOW (ref 13.0–17.0)
MCH: 31.6 pg (ref 26.0–34.0)
MCHC: 34.1 g/dL (ref 30.0–36.0)
MCV: 92.8 fL (ref 80.0–100.0)
Platelets: 238 10*3/uL (ref 150–400)
RBC: 3.48 MIL/uL — ABNORMAL LOW (ref 4.22–5.81)
RDW: 14.8 % (ref 11.5–15.5)
WBC: 5.4 10*3/uL (ref 4.0–10.5)
nRBC: 0 % (ref 0.0–0.2)

## 2023-06-07 LAB — BASIC METABOLIC PANEL
Anion gap: 14 (ref 5–15)
BUN: 25 mg/dL — ABNORMAL HIGH (ref 8–23)
CO2: 25 mmol/L (ref 22–32)
Calcium: 9.3 mg/dL (ref 8.9–10.3)
Chloride: 99 mmol/L (ref 98–111)
Creatinine, Ser: 0.56 mg/dL — ABNORMAL LOW (ref 0.61–1.24)
GFR, Estimated: >60 mL/min (ref >60)
Glucose, Bld: 88 mg/dL (ref 70–99)
Potassium: 4 mmol/L (ref 3.5–5.1)
Sodium: 138 mmol/L (ref 135–145)

## 2023-06-07 LAB — MULTIPLE MYELOMA PANEL, SERUM
Albumin SerPl Elph-Mcnc: 2.5 g/dL — ABNORMAL LOW (ref 2.9–4.4)
Albumin/Glob SerPl: 0.9 (ref 0.7–1.7)
Alpha 1: 0.2 g/dL (ref 0.0–0.4)
Alpha2 Glob SerPl Elph-Mcnc: 0.5 g/dL (ref 0.4–1.0)
B-Globulin SerPl Elph-Mcnc: 1.9 g/dL — ABNORMAL HIGH (ref 0.7–1.3)
Gamma Glob SerPl Elph-Mcnc: 0.2 g/dL — ABNORMAL LOW (ref 0.4–1.8)
Globulin, Total: 2.9 g/dL (ref 2.2–3.9)
IgA: 71 mg/dL (ref 61–437)
IgG (Immunoglobin G), Serum: 1733 mg/dL — ABNORMAL HIGH (ref 603–1613)
IgM (Immunoglobulin M), Srm: 29 mg/dL (ref 15–143)
M Protein SerPl Elph-Mcnc: 1.4 g/dL — ABNORMAL HIGH
Total Protein ELP: 5.4 g/dL — ABNORMAL LOW (ref 6.0–8.5)

## 2023-06-07 LAB — MISC LABCORP TEST (SEND OUT): Labcorp test code: 520057

## 2023-06-07 LAB — VITAMIN E
Vitamin E (Alpha Tocopherol): 9.5 mg/L (ref 9.0–29.0)
Vitamin E(Gamma Tocopherol): 1.2 mg/L (ref 0.5–4.9)

## 2023-06-07 LAB — MAGNESIUM: Magnesium: 1.9 mg/dL (ref 1.7–2.4)

## 2023-06-07 MED ORDER — PANTOPRAZOLE SODIUM 40 MG PO TBEC: 40.0000 mg | DELAYED_RELEASE_TABLET | Freq: Every day | ORAL | Status: AC

## 2023-06-07 MED ORDER — BISACODYL 5 MG PO TBEC: 10.0000 mg | DELAYED_RELEASE_TABLET | Freq: Two times a day (BID) | ORAL | Status: DC

## 2023-06-07 MED ORDER — POLYETHYLENE GLYCOL 3350 17 G PO PACK: 17.0000 g | PACK | Freq: Two times a day (BID) | ORAL | Status: DC

## 2023-06-07 MED ORDER — PREDNISONE 10 MG PO TABS: ORAL_TABLET | ORAL | Status: AC

## 2023-06-07 MED ORDER — POLYSACCHARIDE IRON COMPLEX 150 MG PO CAPS: 150.0000 mg | ORAL_CAPSULE | Freq: Every day | ORAL | 0 refills | Status: DC

## 2023-06-07 MED ORDER — FOLIC ACID 1 MG PO TABS: 1.0000 mg | ORAL_TABLET | Freq: Every day | ORAL | Status: DC

## 2023-06-07 MED ORDER — ZINC SULFATE 220 (50 ZN) MG PO CAPS: 220.0000 mg | ORAL_CAPSULE | Freq: Every day | ORAL | Status: DC

## 2023-06-07 MED ORDER — BISACODYL 10 MG RE SUPP: 10.0000 mg | Freq: Every day | RECTAL | Status: DC | PRN

## 2023-06-07 NOTE — Progress Notes (Signed)
Occupational Therapy Treatment Patient Details Name: [name] MRN: [id number] DOB: [birth year] Today's Date: 06/07/2023   History of present illness Patient is a 89 year old male with history of anxiety, depression, A-fib on Eliquis, hypertension, hyperlipidemia, history of sick sinus syndrome status post pacemaker placement, history of B12 deficiency, who presents to the emergency department for chief concerns of generalized weakness, worsening at assisted living facility. Current MD assessment includes: Generalized weakness, chest pain, a-fib, HLD, and hypokalemia.   OT comments  Pt seen for OT treatment on this date. Upon arrival to room pt supine in bed, reporting he is going to rehab today and wants to brush his teeth. Pt requires minA for bed mobility supine <> EOB, assist needed for BLE mgmt and cuing for safe sequencing. Pt completes seated EOB grooming task with overall minA - setup. Pleasant and motivated, does have proximal weakness due to RTC bilateral injuries from years in [employer]. Pt making good progress toward goals, will continue to follow POC. Discharge recommendation remains appropriate.        If plan is discharge home, recommend the following:  A lot of help with walking and/or transfers;A lot of help with bathing/dressing/bathroom;Assistance with cooking/housework;Assistance with feeding;Direct supervision/assist for medications management;Direct supervision/assist for financial management;Assist for transportation;Help with stairs or ramp for entrance   Equipment Recommendations  Other (comment)    Recommendations for Other Services      Precautions / Restrictions Precautions Precautions: Fall Other Brace: Pt has BIL wrist braces on bedside; states he wears them at night for carpal tunnel pain control Restrictions Weight Bearing Restrictions: No       Mobility Bed Mobility Overal bed mobility: Needs Assistance Bed Mobility: Supine to Sit     Supine to  sit: Min assist     General bed mobility comments: minA for BLE mmgt off and back on bed    Transfers                         Balance Overall balance assessment: Needs assistance Sitting-balance support: Feet supported, No upper extremity supported Sitting balance-Leahy Scale: Good Sitting balance - Comments: good reaching within BOS steady sitting EOB                                   ADL either performed or assessed with clinical judgement   ADL Overall ADL's : Needs assistance/impaired     Grooming: Oral care;Wash/dry face;Wash/dry hands;Minimal assistance;Sitting Grooming Details (indicate cue type and reason): seated EOB, pt requires assist to setup task and minA to facilitate proper positioning                               General ADL Comments: Session focused on seated EOB grooming ADLs today. Pleasant and motivated.    Extremity/Trunk Assessment Upper Extremity Assessment Upper Extremity Assessment: Generalized weakness (proximal weakness, pt needs to support RUE using LUE to brush teeth and wash face) RUE Deficits / Details: pt has BIL RTC injuries at baseline             Cognition Arousal: Alert Behavior During Therapy: WFL for tasks assessed/performed Overall Cognitive Status: Within Functional Limits for tasks assessed  General Comments: pleasant and motivated        Exercises      Shoulder Instructions       General Comments      Pertinent Vitals/ Pain       Pain Assessment Pain Assessment: No/denies pain   Frequency  Min 1X/week        Progress Toward Goals  OT Goals(current goals can now be found in the care plan section)  Progress towards OT goals: Progressing toward goals  Acute Rehab OT Goals OT Goal Formulation: With patient Time For Goal Achievement: 06/17/23 Potential to Achieve Goals: Fair  Plan      Co-evaluation                  AM-PAC OT "6 Clicks" Daily Activity     Outcome Measure   Help from another person eating meals?: A Little Help from another person taking care of personal grooming?: A Little Help from another person toileting, which includes using toliet, bedpan, or urinal?: A Lot Help from another person bathing (including washing, rinsing, drying)?: A Lot Help from another person to put on and taking off regular upper body clothing?: A Lot Help from another person to put on and taking off regular lower body clothing?: Total 6 Click Score: 13    End of Session    OT Visit Diagnosis: Muscle weakness (generalized) (M62.81);History of falling (Z91.81)   Activity Tolerance Patient tolerated treatment well   Patient Left in bed;with call bell/phone within reach;with bed alarm set   Nurse Communication Other (comment)        Time: 7829-5621 OT Time Calculation (min): 13 min  Charges: OT General Charges $OT Visit: 1 Visit OT Treatments $Self Care/Home Management : 8-22 mins  Bao Coreas L. Arynn Armand, OTR/L  06/07/23, 2:36 PM

## 2023-06-07 NOTE — Plan of Care (Signed)
  Problem: Education: Goal: Knowledge of General Education information will improve Description: Including pain rating scale, medication(s)/side effects and non-pharmacologic comfort measures Outcome: Progressing   Problem: Clinical Measurements: Goal: Will remain free from infection Outcome: Progressing   Problem: Clinical Measurements: Goal: Respiratory complications will improve Outcome: Progressing   Problem: Clinical Measurements: Goal: Cardiovascular complication will be avoided Outcome: Progressing   Problem: Elimination: Goal: Will not experience complications related to bowel motility Outcome: Progressing   Problem: Elimination: Goal: Will not experience complications related to urinary retention Outcome: Progressing   Problem: Pain Managment: Goal: General experience of comfort will improve Outcome: Progressing   Problem: Safety: Goal: Ability to remain free from injury will improve Outcome: Progressing

## 2023-06-07 NOTE — Progress Notes (Signed)
Report called to Museum/gallery curator at [employer].

## 2023-06-07 NOTE — Discharge Summary (Signed)
[facility] Discharge Summary   Patient: [name] QIO:[id number]  PCP: Karie Schwalbe, MD  Date of admission: 06/01/2023   Date of discharge:  06/07/2023     Discharge Diagnoses:  Principal Problem:   Shortness of breath on exertion Active Problems:   Hyperlipemia   Essential hypertension, benign   Atrial fibrillation (HCC)   Sick sinus syndrome (HCC)   GERD (gastroesophageal reflux disease)   Acute on chronic heart failure (HCC)   History of anemia due to vitamin B12 deficiency   Elevated troponin   Unintentional weight loss   Admitted From: Home Disposition:  SNF   Recommendations for Outpatient Follow-up:  Follow with PCP, patient should be seen by an MD in 1 to 2 days, started tapering prednisone and PPI for GI prophylaxis. Patient needs follow-up with neurology for EMG and NCS for possible neuromuscular weakness and may need MRI which could not be done here due to pacemaker.  Follow-up pending lab work ordered by neurology Follow up LABS/TEST:  as above and CT chest in 6 months for pulmonary nodules   Contact information for after-discharge care     Destination     [facility] Preferred SNF .   Service: Skilled Nursing Contact information: [address] [place] [address] [phone]                    Diet recommendation: Cardiac diet  Activity: The patient is advised to gradually reintroduce usual activities, as tolerated  Discharge Condition: stable  Code Status: Full code   History of present illness: As per the H and P dictated on admission Hospital Course:  Mr. [name] is a 89 year old male with history of anxiety, depression, A-fib on Eliquis, hypertension, history of sick sinus syndrome status post pacemaker placement, CAD with recent stents who presented to the emergency department for chief concerns of generalized weakness, worsening at assisted living facility.   Assessment and  Plan: # Generalized weakness weakness reportedly developed very quickly in just the last 6 months or so, when previously he was able to golf and walk up stairs, now he can't lift up his legs against gravity.  No muscle pain. Neurology consulted, workup so far negative, recommended EMG/NCS as an outpatient MRI brain and MRI cervical spine could not be done here at [facility] due to pacemaker, patient may need to follow as an outpatient with neurology if MRI needs to be done then patient is to go to [facility]. Empirically started prednisone 40 mg p.o. daily for 3 days, today patient stated that he is feeling some improvement in the lower extremities, he is able to take a few steps and is standing for 3 to 4 minutes.  Also started prednisone tapering dose 30 mg p.o. daily for 3 days, 20 mg p.o. daily for 3 days and 10 mg p.o. daily for 3 days. PT/OT eval recommend SNF placement. Lyme disease serology negative. Follow-up with neurology as an outpatient for EMG and NCS.  Follow-up pending labs which were ordered by neurology.   # Chest pain, ACS ruled out, h/o CAD: pt had DES to LCx and OM in April 2024 at [facility]; repeat cath patent stents in 01/2023. trop 40's flat. cardiology was consulted, recommended no intervention Echo LVEF 60 to 65%, moderate LVH, no any other significant findings. cont statin and plavix (not on ASA due to Eliquis) # Unintentional weight loss: pt reported eating well.  Had colonoscopy back in 2019 with  polyps removed.  Had recent lung imaging at [facility] with no report of malignancy. PSA  0.39 wnl CT c/a/p: 1. Indeterminate 1 cm diameter ground-glass nodule in the left lung apex. Neoplasm is not excluded. Initial follow-up with CT at 6 months is recommended to confirm persistence. If persistent, repeat CT is recommended every 2 years until 5 years of stability has been established. 2. Diffuse interstitial pattern to the lungs most prominent in the periphery, likely edema. Small bilateral pleural  effusions. 3. Cardiac enlargement. 4. Aortic atherosclerosis. 5. Small abdominal ascites.  6. Prostate is enlarged. # History of anemia due to vitamin B12 deficiency: Vit B12 771 wnl  # Sick sinus syndrome: s/p pacemaker # Atrial fibrillation: cont home Eliquis.  Patient is not on Cardizem which has been discontinued # Hyperlipemia: cont home statin # Hypokalemia, Resolved. K repleted # Iron deficiency, transferrin saturation 16%, slightly low, s/p Venofer 200 mg IV x 5 doses given during hospital stay, continue oral iron supplement with vitamin C.  # Folic acid 6.1 at lower end,  Started oral supplement. # Zinc deficiency, started oral zinc supplement. Repeat iron level, folate and zinc level after 3 to 6 months.  # Pulmonary nodule: CT chest:  Indeterminate 1 cm diameter ground-glass nodule in the left lung apex. Neoplasm is not excluded. Initial follow-up with CT at 6 months is recommended to confirm persistence. If persistent, repeat CT is recommended every 2 years until 5 years of stability has been established. Follow-up as an outpatient with PCP and pulmonologist # Constipation: continue laxatives.  Body mass index is 24.17 kg/m.  Nutrition Interventions:  Patient was seen by physical therapy, who recommended Therapy, SNF placement, which was arranged. On the day of the discharge the patient's vitals were stable, and no other acute medical condition were reported by patient. the patient was felt safe to be discharge at [facility] .  Consultants: Neurologist Procedures: None  Discharge Exam: General: Appear in no distress, no Rash; Oral Mucosa Clear, moist. Cardiovascular: S1 and S2 Present, no Murmur, Respiratory: normal respiratory effort, Bilateral Air entry present and no Crackles, no wheezes Abdomen: Bowel Sound present, Soft and no tenderness, no hernia Extremities: no Pedal edema, no calf tenderness Neurology: alert and oriented to time, place, and person affect  appropriate.  Filed Weights   06/01/23 1421  Weight: 76.4 kg   Vitals:   06/07/23 0432 06/07/23 0727  BP: 113/62 (!) 109/57  Pulse: (!) 59 (!) 59  Resp: 17 16  Temp: 97.6 F (36.4 C) 98 F (36.7 C)  SpO2: 99% 99%    DISCHARGE MEDICATION: Allergies as of 06/07/2023       Reactions   Augmentin [amoxicillin-pot Clavulanate] Rash   Colchicine Rash   Morphine And Codeine    Shaking, back pain.        Medication List     STOP taking these medications    ALPRAZolam 0.25 MG tablet Commonly known as: XANAX   diltiazem 120 MG 24 hr capsule Commonly known as: TIAZAC   Glucosamine 500 MG Caps   sennosides-docusate sodium 8.6-50 MG tablet Commonly known as: SENOKOT-S   vitamin B-12 500 MCG tablet Commonly known as: CYANOCOBALAMIN       TAKE these medications    acetaminophen 325 MG tablet Commonly known as: TYLENOL Take 650 mg by mouth every 6 (six) hours as needed. Reported on 01/17/2016   apixaban 5 MG Tabs tablet Commonly known as: ELIQUIS Take 5 mg by mouth 2 (two) times daily.  atorvastatin 40 MG tablet Commonly known as: LIPITOR Take 80 mg by mouth daily.   bisacodyl 5 MG EC tablet Commonly known as: DULCOLAX Take 2 tablets (10 mg total) by mouth 2 (two) times daily.   bisacodyl 10 MG suppository Commonly known as: DULCOLAX Place 1 suppository (10 mg total) rectally daily as needed for severe constipation.   clopidogrel 75 MG tablet Commonly known as: PLAVIX TAKE ONE TABLET BY MOUTH EVERY DAY FOR BLOOD CLOT PREVENTION FOLLOWING PCI BE SURE TO CHECK WITH YOUR DOCTOR ABOUT HOW LONG YOU SHOULD TAKE THIS MEDICINE   finasteride 5 MG tablet Commonly known as: PROSCAR Take 1 tablet (5 mg total) by mouth daily.   folic acid 1 MG tablet Commonly known as: FOLVITE Take 1 tablet (1 mg total) by mouth daily. Start taking on: June 08, 2023   iron polysaccharides 150 MG capsule Commonly known as: NIFEREX Take 1 capsule (150 mg total) by mouth  daily. Start taking on: June 08, 2023   pantoprazole 40 MG tablet Commonly known as: PROTONIX Take 1 tablet (40 mg total) by mouth daily. Start taking on: June 08, 2023   polyethylene glycol 17 g packet Commonly known as: MIRALAX / GLYCOLAX Take 17 g by mouth 2 (two) times daily. Skip the dose if no constipation   predniSONE 10 MG tablet Commonly known as: DELTASONE Take 3 tablets (30 mg total) by mouth daily for 3 days, THEN 2 tablets (20 mg total) daily for 3 days, THEN 1 tablet (10 mg total) daily for 3 days. Start taking on: June 07, 2023   tamsulosin 0.4 MG Caps capsule Commonly known as: FLOMAX TAKE ONE CAPSULE BY MOUTH EVERY DAY What changed: when to take this   zinc sulfate 220 (50 Zn) MG capsule Take 1 capsule (220 mg total) by mouth daily. Start taking on: June 08, 2023       Allergies  Allergen Reactions   Augmentin [Amoxicillin-Pot Clavulanate] Rash   Colchicine Rash   Morphine And Codeine     Shaking, back pain.   Discharge Instructions     Ambulatory referral to Neurology   Complete by: As directed    An appointment is requested in approximately: 1 week for EMG/NCS for possible neuromuscular weakness in bilateral upper and lower extremity.   Call MD for:  difficulty breathing, headache or visual disturbances   Complete by: As directed    Call MD for:  extreme fatigue   Complete by: As directed    Call MD for:  persistant dizziness or light-headedness   Complete by: As directed    Call MD for:  severe uncontrolled pain   Complete by: As directed    Call MD for:  temperature >100.4   Complete by: As directed    Diet - low sodium heart healthy   Complete by: As directed    Discharge instructions   Complete by: As directed    Follow with PCP, patient should be seen by an MD in 1 to 2 days, started tapering prednisone and PPI for GI prophylaxis. Patient needs follow-up with neurology for EMG and NCS for possible neuromuscular weakness and may  need MRI which could not be done here due to pacemaker.  Follow-up pending lab work ordered by neurology.   Increase activity slowly   Complete by: As directed    No wound care   Complete by: As directed        The results of significant diagnostics from this hospitalization (including imaging,  microbiology, ancillary and laboratory) are listed below for reference.    Significant Diagnostic Studies: CT CHEST ABDOMEN PELVIS W CONTRAST  Addendum Date: 06/03/2023   ADDENDUM REPORT: 06/03/2023 19:59 ADDENDUM: Additional history obtained: Generalized weakness and fatigue for 6 months. Chest pain and difficulty breathing for couple of days. Electronically Signed   By: Burman Nieves M.D.   On: 06/03/2023 19:59   Result Date: 06/03/2023 CLINICAL DATA:  Occult malignancy. EXAM: CT CHEST, ABDOMEN, AND PELVIS WITH CONTRAST TECHNIQUE: Multidetector CT imaging of the chest, abdomen and pelvis was performed following the standard protocol during bolus administration of intravenous contrast. RADIATION DOSE REDUCTION: This exam was performed according to the departmental dose-optimization program which includes automated exposure control, adjustment of the mA and/or kV according to patient size and/or use of iterative reconstruction technique. CONTRAST:  OMNIPAQUE IOHEXOL 300 MG/ML  SOLN COMPARISON:  Chest radiograph 06/01/2023. CT abdomen and pelvis 11/01/2018 FINDINGS: CT CHEST FINDINGS Cardiovascular: Cardiac enlargement. No pericardial effusions. Cardiac pacemaker. Normal caliber thoracic aorta. No dissection. Calcification of the aorta and coronary arteries. Prominent hemi azygous vein. Mediastinum/Nodes: Thyroid gland is unremarkable. Esophagus is decompressed. No significant lymphadenopathy. Lungs/Pleura: Small bilateral pleural effusions with basilar atelectasis. Diffuse interstitial pattern to the lungs, mostly peripheral. This could represent acute edema or chronic fibrosis. There is a  ground-glass nodule in the left apex measuring 1 cm diameter. This could represent neoplasm or inflammatory process. Musculoskeletal: Degenerative changes in the spine. No destructive bone lesions. Intramuscular lipoma in the right shoulder. CT ABDOMEN PELVIS FINDINGS Hepatobiliary: Multiple circumscribed low-attenuation lesions in the liver. Largest is in segment 4 measuring 4.6 cm diameter. No complicating features are indicated. No change since prior study. This is consistent with simple cysts. No imaging follow-up is indicated. Gallbladder is surgically absent. No bile duct dilatation. Pancreas: Unremarkable. No pancreatic ductal dilatation or surrounding inflammatory changes. Spleen: Normal in size without focal abnormality. Adrenals/Urinary Tract: Adrenal glands are unremarkable. Kidneys are normal, without renal calculi, focal lesion, or hydronephrosis. Bladder is unremarkable. Stomach/Bowel: Stomach, small bowel, and colon are not abnormally distended. No wall thickening or inflammatory changes. Stool fills the colon suggesting possible constipation. Scattered colonic diverticula without evidence of acute diverticulitis. Sigmoid colonic anastomosis. Appendix is not identified. Vascular/Lymphatic: Aortic atherosclerosis. No enlarged abdominal or pelvic lymph nodes. Reproductive: Prostate gland is enlarged. Other: Small amount of free fluid in the upper abdomen along the pericolic gutters. No free air. Minimal ventral abdominal wall hernia containing fat. Musculoskeletal: Degenerative changes in the spine. Degenerative changes in the hips. Lucent and sclerotic changes in the L1 vertebra likely representing vertebral hemangioma. Small focal areas of sclerosis in the pelvis are nonspecific but likely benign bone islands. No change since previous study. Fusion of the upper SI joints. IMPRESSION: 1. Indeterminate 1 cm diameter ground-glass nodule in the left lung apex. Neoplasm is not excluded. Initial follow-up  with CT at 6 months is recommended to confirm persistence. If persistent, repeat CT is recommended every 2 years until 5 years of stability has been established. This recommendation follows the consensus statement: Guidelines for Management of Incidental Pulmonary Nodules Detected on CT Images: From the Fleischner Society 2017; Radiology 2017; 284:228-243. 2. Diffuse interstitial pattern to the lungs most prominent in the periphery, likely edema. Small bilateral pleural effusions. 3. Cardiac enlargement. 4. Aortic atherosclerosis. 5. Small abdominal ascites. 6. Prostate is enlarged. Electronically Signed: By: Burman Nieves M.D. On: 06/03/2023 19:43   CT CERVICAL SPINE WO CONTRAST  Result Date: 06/03/2023 CLINICAL DATA:  Ataxia,  nontraumatic. EXAM: CT CERVICAL SPINE WITHOUT CONTRAST CT THORACIC AND LUMBAR SPINE WITH CONTRAST TECHNIQUE: Multidetector CT imaging of the cervical spine was performed without intravenous contrast. Multiplanar CT image reconstructions were also generated. Multiplanar CT images of the thoracic and lumbar spine were reconstructed from contemporary CT of the Chest, Abdomen, and Pelvis. RADIATION DOSE REDUCTION: This exam was performed according to the departmental dose-optimization program which includes automated exposure control, adjustment of the mA and/or kV according to patient size and/or use of iterative reconstruction technique. COMPARISON:  CT abdomen/pelvis 11/01/2018, two-view chest radiograph 09/30/2019 FINDINGS: CT CERVICAL SPINE FINDINGS Alignment: There is trace anterolisthesis of C4 on C5. There is no evidence of traumatic malalignment. Skull base and vertebrae: Skull base alignment is maintained. Vertebral body heights are preserved, without evidence of acute fracture. There is no suspicious osseous lesion. Soft tissues and spinal canal: No prevertebral fluid or swelling. No visible canal hematoma. Disc levels: There is moderate to severe disc space narrowing at C5-C6  through C7-T1. C1-C2: There is advanced degenerative change of the atlantodental articulation with mild pannus but no narrowing of the craniocervical junction. C2-C3: There is moderate bilateral facet arthropathy without significant spinal canal or neural foraminal stenosis. C3-C4: There is mild bilateral uncovertebral ridging and moderate bilateral facet arthropathy resulting in mild-to-moderate bilateral neural foraminal stenosis without significant spinal canal stenosis C4-C5: There is trace anterolisthesis with bilateral uncovertebral ridging and moderate to severe bilateral facet arthropathy resulting in moderate to severe bilateral neural foraminal stenosis without significant spinal canal stenosis C5-C6: There is partial osseous fusion across the disc space with bilateral uncovertebral ridging and mild facet arthropathy resulting in moderate right and no significant left neural foraminal stenosis and no significant spinal canal stenosis C6-C7: Bilateral uncovertebral ridging resulting in mild bilateral neural foraminal stenosis without significant spinal canal stenosis C7-T1: Mild bilateral uncovertebral ridging and facet arthropathy resulting in mild left and no significant right neural foraminal stenosis and no significant spinal canal stenosis. Upper chest: Assessed on the separately dictated CT chest. CT THORACIC SPINE FINDINGS Alignment: There is mild thoracic dextrocurvature. There is no antero or retrolisthesis. Vertebrae: There is a 1.2 cm lucent lesion in the T11 vertebral body and a smaller lucent lesion in the T10 vertebral body, both of which are unchanged since 2015 consistent with benign lesions, likely hemangiomas. There are no other focal lesions. Vertebral body heights are preserved, without evidence of acute fracture. Paraspinal and other soft tissues: The paraspinal soft tissues are unremarkable. The chest is assessed on the separately dictated CT chest. Disc levels: The disc spaces are  preserved. There is mild degenerative endplate change anteriorly in the midthoracic spine most notably at T8-T9. There is overall mild multilevel facet arthropathy. There is no significant disc herniation by CT. There is no significant osseous spinal canal or neural foraminal stenosis. CT LUMBAR SPINE FINDINGS Segmentation: Standard; the lowest formed disc space is designated L5-S1. Alignment: There is mild lumbar levocurvature centered at L3. There is no antero or retrolisthesis. Vertebrae: The large hemangioma occupying essentially the entirety of the L1 vertebral body is unchanged going back to 2015. There are no other suspicious osseous lesions. Vertebral body heights are preserved, without evidence of acute fracture. Paraspinal and other soft tissues: The paraspinal soft tissues are unremarkable. The abdominal and pelvic viscera are assessed on the separately dictated CT abdomen/pelvis. Disc levels: There is multilevel moderate to advanced disc space narrowing with vacuum disc phenomenon throughout the lumbar spine. T12-L1: No significant spinal canal or neural foraminal  stenosis L1-L2: There is mild-to-moderate bilateral facet arthropathy without high-grade spinal canal or neural foraminal stenosis L2-L3: There is a disc bulge, mild endplate spurring, and moderate bilateral facet arthropathy resulting in mild left worse than right neural foraminal stenosis without significant spinal canal stenosis L3-L4: There is a disc bulge, endplate spurring, and mild bilateral facet arthropathy resulting in probable mild spinal canal stenosis and mild bilateral neural foraminal stenosis L4-L5: Manson Passey there is a disc bulge, endplate spurring, and moderate to advanced right worse than left facet arthropathy resulting in moderate left and mild right neural foraminal stenosis and probable mild spinal canal stenosis L5-S1: There is right worse than left endplate spurring and moderate bilateral facet arthropathy resulting in  moderate left and no significant right neural foraminal stenosis and no significant spinal canal stenosis. IMPRESSION: 1. No acute finding in the cervical, thoracic, or lumbar spine. 2. Multilevel uncovertebral ridging and facet arthropathy throughout the cervical spine as detailed above resulting in up to moderate to severe bilateral neural foraminal stenosis C4-C5. No high-grade spinal canal stenosis at any level in the cervical spine. 3. Mild degenerative changes in the thoracic spine without high-grade spinal canal or neural foraminal stenosis. 4. Multilevel disc and facet degeneration throughout the lumbar spine as detailed above resulting in up to moderate left neural foraminal stenosis at L4-L5 and L5-S1. No high-grade spinal canal stenosis at any level. Electronically Signed   By: Lesia Hausen M.D.   On: 06/03/2023 19:55   CT T-SPINE NO CHARGE  Result Date: 06/03/2023 CLINICAL DATA:  Ataxia, nontraumatic. EXAM: CT CERVICAL SPINE WITHOUT CONTRAST CT THORACIC AND LUMBAR SPINE WITH CONTRAST TECHNIQUE: Multidetector CT imaging of the cervical spine was performed without intravenous contrast. Multiplanar CT image reconstructions were also generated. Multiplanar CT images of the thoracic and lumbar spine were reconstructed from contemporary CT of the Chest, Abdomen, and Pelvis. RADIATION DOSE REDUCTION: This exam was performed according to the departmental dose-optimization program which includes automated exposure control, adjustment of the mA and/or kV according to patient size and/or use of iterative reconstruction technique. COMPARISON:  CT abdomen/pelvis 11/01/2018, two-view chest radiograph 09/30/2019 FINDINGS: CT CERVICAL SPINE FINDINGS Alignment: There is trace anterolisthesis of C4 on C5. There is no evidence of traumatic malalignment. Skull base and vertebrae: Skull base alignment is maintained. Vertebral body heights are preserved, without evidence of acute fracture. There is no suspicious osseous  lesion. Soft tissues and spinal canal: No prevertebral fluid or swelling. No visible canal hematoma. Disc levels: There is moderate to severe disc space narrowing at C5-C6 through C7-T1. C1-C2: There is advanced degenerative change of the atlantodental articulation with mild pannus but no narrowing of the craniocervical junction. C2-C3: There is moderate bilateral facet arthropathy without significant spinal canal or neural foraminal stenosis. C3-C4: There is mild bilateral uncovertebral ridging and moderate bilateral facet arthropathy resulting in mild-to-moderate bilateral neural foraminal stenosis without significant spinal canal stenosis C4-C5: There is trace anterolisthesis with bilateral uncovertebral ridging and moderate to severe bilateral facet arthropathy resulting in moderate to severe bilateral neural foraminal stenosis without significant spinal canal stenosis C5-C6: There is partial osseous fusion across the disc space with bilateral uncovertebral ridging and mild facet arthropathy resulting in moderate right and no significant left neural foraminal stenosis and no significant spinal canal stenosis C6-C7: Bilateral uncovertebral ridging resulting in mild bilateral neural foraminal stenosis without significant spinal canal stenosis C7-T1: Mild bilateral uncovertebral ridging and facet arthropathy resulting in mild left and no significant right neural foraminal stenosis and no significant  spinal canal stenosis. Upper chest: Assessed on the separately dictated CT chest. CT THORACIC SPINE FINDINGS Alignment: There is mild thoracic dextrocurvature. There is no antero or retrolisthesis. Vertebrae: There is a 1.2 cm lucent lesion in the T11 vertebral body and a smaller lucent lesion in the T10 vertebral body, both of which are unchanged since 2015 consistent with benign lesions, likely hemangiomas. There are no other focal lesions. Vertebral body heights are preserved, without evidence of acute fracture.  Paraspinal and other soft tissues: The paraspinal soft tissues are unremarkable. The chest is assessed on the separately dictated CT chest. Disc levels: The disc spaces are preserved. There is mild degenerative endplate change anteriorly in the midthoracic spine most notably at T8-T9. There is overall mild multilevel facet arthropathy. There is no significant disc herniation by CT. There is no significant osseous spinal canal or neural foraminal stenosis. CT LUMBAR SPINE FINDINGS Segmentation: Standard; the lowest formed disc space is designated L5-S1. Alignment: There is mild lumbar levocurvature centered at L3. There is no antero or retrolisthesis. Vertebrae: The large hemangioma occupying essentially the entirety of the L1 vertebral body is unchanged going back to 2015. There are no other suspicious osseous lesions. Vertebral body heights are preserved, without evidence of acute fracture. Paraspinal and other soft tissues: The paraspinal soft tissues are unremarkable. The abdominal and pelvic viscera are assessed on the separately dictated CT abdomen/pelvis. Disc levels: There is multilevel moderate to advanced disc space narrowing with vacuum disc phenomenon throughout the lumbar spine. T12-L1: No significant spinal canal or neural foraminal stenosis L1-L2: There is mild-to-moderate bilateral facet arthropathy without high-grade spinal canal or neural foraminal stenosis L2-L3: There is a disc bulge, mild endplate spurring, and moderate bilateral facet arthropathy resulting in mild left worse than right neural foraminal stenosis without significant spinal canal stenosis L3-L4: There is a disc bulge, endplate spurring, and mild bilateral facet arthropathy resulting in probable mild spinal canal stenosis and mild bilateral neural foraminal stenosis L4-L5: Manson Passey there is a disc bulge, endplate spurring, and moderate to advanced right worse than left facet arthropathy resulting in moderate left and mild right neural  foraminal stenosis and probable mild spinal canal stenosis L5-S1: There is right worse than left endplate spurring and moderate bilateral facet arthropathy resulting in moderate left and no significant right neural foraminal stenosis and no significant spinal canal stenosis. IMPRESSION: 1. No acute finding in the cervical, thoracic, or lumbar spine. 2. Multilevel uncovertebral ridging and facet arthropathy throughout the cervical spine as detailed above resulting in up to moderate to severe bilateral neural foraminal stenosis C4-C5. No high-grade spinal canal stenosis at any level in the cervical spine. 3. Mild degenerative changes in the thoracic spine without high-grade spinal canal or neural foraminal stenosis. 4. Multilevel disc and facet degeneration throughout the lumbar spine as detailed above resulting in up to moderate left neural foraminal stenosis at L4-L5 and L5-S1. No high-grade spinal canal stenosis at any level. Electronically Signed   By: Lesia Hausen M.D.   On: 06/03/2023 19:55   CT L-SPINE NO CHARGE  Result Date: 06/03/2023 CLINICAL DATA:  Ataxia, nontraumatic. EXAM: CT CERVICAL SPINE WITHOUT CONTRAST CT THORACIC AND LUMBAR SPINE WITH CONTRAST TECHNIQUE: Multidetector CT imaging of the cervical spine was performed without intravenous contrast. Multiplanar CT image reconstructions were also generated. Multiplanar CT images of the thoracic and lumbar spine were reconstructed from contemporary CT of the Chest, Abdomen, and Pelvis. RADIATION DOSE REDUCTION: This exam was performed according to the departmental dose-optimization program which  includes automated exposure control, adjustment of the mA and/or kV according to patient size and/or use of iterative reconstruction technique. COMPARISON:  CT abdomen/pelvis 11/01/2018, two-view chest radiograph 09/30/2019 FINDINGS: CT CERVICAL SPINE FINDINGS Alignment: There is trace anterolisthesis of C4 on C5. There is no evidence of traumatic malalignment.  Skull base and vertebrae: Skull base alignment is maintained. Vertebral body heights are preserved, without evidence of acute fracture. There is no suspicious osseous lesion. Soft tissues and spinal canal: No prevertebral fluid or swelling. No visible canal hematoma. Disc levels: There is moderate to severe disc space narrowing at C5-C6 through C7-T1. C1-C2: There is advanced degenerative change of the atlantodental articulation with mild pannus but no narrowing of the craniocervical junction. C2-C3: There is moderate bilateral facet arthropathy without significant spinal canal or neural foraminal stenosis. C3-C4: There is mild bilateral uncovertebral ridging and moderate bilateral facet arthropathy resulting in mild-to-moderate bilateral neural foraminal stenosis without significant spinal canal stenosis C4-C5: There is trace anterolisthesis with bilateral uncovertebral ridging and moderate to severe bilateral facet arthropathy resulting in moderate to severe bilateral neural foraminal stenosis without significant spinal canal stenosis C5-C6: There is partial osseous fusion across the disc space with bilateral uncovertebral ridging and mild facet arthropathy resulting in moderate right and no significant left neural foraminal stenosis and no significant spinal canal stenosis C6-C7: Bilateral uncovertebral ridging resulting in mild bilateral neural foraminal stenosis without significant spinal canal stenosis C7-T1: Mild bilateral uncovertebral ridging and facet arthropathy resulting in mild left and no significant right neural foraminal stenosis and no significant spinal canal stenosis. Upper chest: Assessed on the separately dictated CT chest. CT THORACIC SPINE FINDINGS Alignment: There is mild thoracic dextrocurvature. There is no antero or retrolisthesis. Vertebrae: There is a 1.2 cm lucent lesion in the T11 vertebral body and a smaller lucent lesion in the T10 vertebral body, both of which are unchanged since  2015 consistent with benign lesions, likely hemangiomas. There are no other focal lesions. Vertebral body heights are preserved, without evidence of acute fracture. Paraspinal and other soft tissues: The paraspinal soft tissues are unremarkable. The chest is assessed on the separately dictated CT chest. Disc levels: The disc spaces are preserved. There is mild degenerative endplate change anteriorly in the midthoracic spine most notably at T8-T9. There is overall mild multilevel facet arthropathy. There is no significant disc herniation by CT. There is no significant osseous spinal canal or neural foraminal stenosis. CT LUMBAR SPINE FINDINGS Segmentation: Standard; the lowest formed disc space is designated L5-S1. Alignment: There is mild lumbar levocurvature centered at L3. There is no antero or retrolisthesis. Vertebrae: The large hemangioma occupying essentially the entirety of the L1 vertebral body is unchanged going back to 2015. There are no other suspicious osseous lesions. Vertebral body heights are preserved, without evidence of acute fracture. Paraspinal and other soft tissues: The paraspinal soft tissues are unremarkable. The abdominal and pelvic viscera are assessed on the separately dictated CT abdomen/pelvis. Disc levels: There is multilevel moderate to advanced disc space narrowing with vacuum disc phenomenon throughout the lumbar spine. T12-L1: No significant spinal canal or neural foraminal stenosis L1-L2: There is mild-to-moderate bilateral facet arthropathy without high-grade spinal canal or neural foraminal stenosis L2-L3: There is a disc bulge, mild endplate spurring, and moderate bilateral facet arthropathy resulting in mild left worse than right neural foraminal stenosis without significant spinal canal stenosis L3-L4: There is a disc bulge, endplate spurring, and mild bilateral facet arthropathy resulting in probable mild spinal canal stenosis and mild bilateral  neural foraminal stenosis  L4-L5: Manson Passey there is a disc bulge, endplate spurring, and moderate to advanced right worse than left facet arthropathy resulting in moderate left and mild right neural foraminal stenosis and probable mild spinal canal stenosis L5-S1: There is right worse than left endplate spurring and moderate bilateral facet arthropathy resulting in moderate left and no significant right neural foraminal stenosis and no significant spinal canal stenosis. IMPRESSION: 1. No acute finding in the cervical, thoracic, or lumbar spine. 2. Multilevel uncovertebral ridging and facet arthropathy throughout the cervical spine as detailed above resulting in up to moderate to severe bilateral neural foraminal stenosis C4-C5. No high-grade spinal canal stenosis at any level in the cervical spine. 3. Mild degenerative changes in the thoracic spine without high-grade spinal canal or neural foraminal stenosis. 4. Multilevel disc and facet degeneration throughout the lumbar spine as detailed above resulting in up to moderate left neural foraminal stenosis at L4-L5 and L5-S1. No high-grade spinal canal stenosis at any level. Electronically Signed   By: Lesia Hausen M.D.   On: 06/03/2023 19:55   ECHOCARDIOGRAM COMPLETE  Result Date: 06/02/2023    ECHOCARDIOGRAM REPORT   Patient Name:   LEANDRO HULBURT Date of Exam: 06/02/2023 Medical Rec #:  [id number]     Height:       70.0 in Accession #:    [id number]    Weight:       168.4 lb Date of Birth:  [birth year]      BSA:          1.940 m Patient Age:    89 years      BP:           112/59 mmHg Patient Gender: M             HR:           60 bpm. Exam Location:  [facility] Procedure: 2D Echo and Strain Analysis Indications:     Dyspnea R06.00                  Elevated Troponin  History:         Patient has no prior history of Echocardiogram examinations.  Sonographer:     Overton Mam RDCS, FASE Referring Phys:  [id number] AMY N COX Diagnosing Phys: Dietrich Pates MD  Sonographer Comments: Global longitudinal strain  was attempted. IMPRESSIONS  1. Left ventricular ejection fraction, by estimation, is 60 to 65%. The left ventricle has normal function. The left ventricle has no regional wall motion abnormalities. There is moderate left ventricular hypertrophy. Left ventricular diastolic parameters are indeterminate.  2. Right ventricular systolic function is normal. The right ventricular size is normal.  3. Mild mitral valve regurgitation.  4. The aortic valve is tricuspid. Aortic valve regurgitation is mild. Aortic valve sclerosis/calcification is present, without any evidence of aortic stenosis.  5. Pulmonic valve regurgitation is moderate. FINDINGS  Left Ventricle: Left ventricular ejection fraction, by estimation, is 60 to 65%. The left ventricle has normal function. The left ventricle has no regional wall motion abnormalities. Global longitudinal strain performed but not reported based on interpreter judgement due to suboptimal tracking. The global longitudinal strain is normal despite suboptimal segment tracking. The left ventricular internal cavity size was normal in size. There is moderate left ventricular hypertrophy. Left ventricular  diastolic parameters are indeterminate. Right Ventricle: The right ventricular size is normal. Right vetricular wall thickness was not assessed. Right ventricular systolic function is normal. Left Atrium: Left atrial size was  normal in size. Right Atrium: Right atrial size was normal in size. Pericardium: Trivial pericardial effusion is present. Mitral Valve: There is mild thickening of the mitral valve leaflet(s). Mild mitral valve regurgitation. MV peak gradient, 101.6 mmHg. The mean mitral valve gradient is 54.0 mmHg. Tricuspid Valve: The tricuspid valve is normal in structure. Tricuspid valve regurgitation is mild. Aortic Valve: The aortic valve is tricuspid. Aortic valve regurgitation is mild. Aortic regurgitation PHT measures 648 msec. Aortic valve sclerosis/calcification is present,  without any evidence of aortic stenosis. Aortic valve mean gradient measures 12.0 mmHg. Aortic valve peak gradient measures 25.2 mmHg. Aortic valve area, by VTI measures 1.79 cm. Pulmonic Valve: The pulmonic valve was normal in structure. Pulmonic valve regurgitation is moderate. Aorta: The aortic root and ascending aorta are structurally normal, with no evidence of dilitation. IAS/Shunts: No atrial level shunt detected by color flow Doppler. Additional Comments: A device lead is visualized.  LEFT VENTRICLE PLAX 2D LVIDd:         4.30 cm   Diastology LVIDs:         3.00 cm   LV e' medial:    6.09 cm/s LV PW:         1.30 cm   LV E/e' medial:  16.1 LV IVS:        1.60 cm   LV e' lateral:   6.96 cm/s LVOT diam:     1.90 cm   LV E/e' lateral: 14.1 LV SV:         70 LV SV Index:   36 LVOT Area:     2.84 cm  RIGHT VENTRICLE RV Basal diam:  3.40 cm RV S prime:     12.80 cm/s TAPSE (M-mode): 1.9 cm LEFT ATRIUM           Index        RIGHT ATRIUM           Index LA diam:      4.00 cm 2.06 cm/m   RA Area:     17.00 cm LA Vol (A4C): 62.5 ml 32.21 ml/m  RA Volume:   45.00 ml  23.19 ml/m  AORTIC VALVE                     PULMONIC VALVE AV Area (Vmax):    1.54 cm      PV Vmax:       1.28 m/s AV Area (Vmean):   1.75 cm      PV Peak grad:  6.6 mmHg AV Area (VTI):     1.79 cm AV Vmax:           251.00 cm/s AV Vmean:          156.667 cm/s AV VTI:            0.393 m AV Peak Grad:      25.2 mmHg AV Mean Grad:      12.0 mmHg LVOT Vmax:         136.00 cm/s LVOT Vmean:        96.700 cm/s LVOT VTI:          0.248 m LVOT/AV VTI ratio: 0.63 AI PHT:            648 msec  AORTA Ao Root diam: 3.40 cm Ao Asc diam:  3.10 cm MITRAL VALVE               TRICUSPID VALVE MV Area (PHT): 2.87 cm  TR Peak grad:   33.6 mmHg MV Area VTI:   0.58 cm    TR Vmax:        290.00 cm/s MV Peak grad:  101.6 mmHg MV Mean grad:  54.0 mmHg   SHUNTS MV Vmax:       5.04 m/s    Systemic VTI:  0.25 m MV Vmean:      331.0 cm/s  Systemic Diam: 1.90 cm MV Decel  Time: 264 msec MR PISA:        1.01 cm MR PISA Radius: 0.40 cm MV E velocity: 98.00 cm/s MV A velocity: 27.80 cm/s MV E/A ratio:  3.53 Dietrich Pates MD Electronically signed by Dietrich Pates MD Signature Date/Time: 06/02/2023/9:40:49 PM    Final    CT Head Wo Contrast  Result Date: 06/01/2023 CLINICAL DATA:  Neuro deficit, acute, stroke suspected. Patient reports progressive upper and lower extremity weakness over the last 2-3 weeks. Patient brought by family for progressive deterioration. EXAM: CT HEAD WITHOUT CONTRAST TECHNIQUE: Contiguous axial images were obtained from the base of the skull through the vertex without intravenous contrast. RADIATION DOSE REDUCTION: This exam was performed according to the departmental dose-optimization program which includes automated exposure control, adjustment of the mA and/or kV according to patient size and/or use of iterative reconstruction technique. COMPARISON:  None Available. FINDINGS: Brain: Mild atrophy and white matter changes are within normal limits for age. Deep brain nuclei are within normal limits. The ventricles are of normal size. No significant extraaxial fluid collection is present. The brainstem and cerebellum are within normal limits. Midline structures are within normal limits. Vascular: Atherosclerotic calcifications are present within the cavernous internal carotid arteries bilaterally. No hyperdense vessel is present. Skull: Calvarium is intact. No focal lytic or blastic lesions are present. No significant extracranial soft tissue lesion is present. Sinuses/Orbits: The paranasal sinuses and mastoid air cells are clear. Bilateral lens replacements are noted. Globes and orbits are otherwise unremarkable. Other: IMPRESSION: 1. No acute or focal lesion to explain the patient's symptoms. 2. Normal CT appearance of the brain for age. Electronically Signed   By: Marin Roberts M.D.   On: 06/01/2023 16:57   DG Chest Port 1 View  Result Date:  06/01/2023 CLINICAL DATA:  Weakness EXAM: PORTABLE CHEST 1 VIEW COMPARISON:  X-ray 09/30/2019 FINDINGS: Hyperinflation. Normal cardiopericardial silhouette. Calcified aorta. Minimal linear changes at the bases, likely scar or atelectasis. No pneumothorax or edema. Left upper chest pacemaker with leads along the right side of the heart. Degenerative changes of the spine and shoulders. IMPRESSION: Hyperinflation.  Minimal basilar scar or atelectasis.  Pacemaker Electronically Signed   By: Karen Kays M.D.   On: 06/01/2023 15:16   DG Knee Complete 4 Views Left  Result Date: 05/28/2023 CLINICAL DATA:  Fall, left knee pain EXAM: LEFT KNEE - COMPLETE 4+ VIEW COMPARISON:  None Available. FINDINGS: No acute fracture or dislocation. There is tricompartmental degenerative arthritis, mild within the lateral and patellofemoral compartments and relatively severe within the medial compartment with resultant overall mild varus angulation of the left knee. No effusion. Mild degenerative enthesopathy involving the quadriceps tendon insertion upon the patella. IMPRESSION: 1. Tricompartmental degenerative arthritis, relatively severe within the medial compartment. Electronically Signed   By: Helyn Numbers M.D.   On: 05/28/2023 23:28   DG Knee Complete 4 Views Right  Result Date: 05/28/2023 CLINICAL DATA:  Right knee pain EXAM: RIGHT KNEE - COMPLETE 4+ VIEW COMPARISON:  None Available. FINDINGS: No acute fracture or dislocation. Moderate  to severe tricompartmental degenerative arthritis, most severe within the medial compartment, with resultant mild overall straightening. Small right knee effusion. Mild degenerative enthesopathy involving the insertion of the quadriceps and patellar tendons upon the patella. IMPRESSION: 1. Moderate to severe tricompartmental degenerative arthritis. Electronically Signed   By: Helyn Numbers M.D.   On: 05/28/2023 23:27    Microbiology: Recent Results (from the past 240 hour(s))  SARS  Coronavirus 2 by RT PCR (hospital order, performed in [facility] hospital lab) *cepheid single result test* Anterior Nasal Swab     Status: None   Collection Time: 06/01/23  2:27 PM   Specimen: Anterior Nasal Swab  Result Value Ref Range Status   SARS Coronavirus 2 by RT PCR NEGATIVE NEGATIVE Final    Comment: (NOTE) SARS-CoV-2 target nucleic acids are NOT DETECTED.  The SARS-CoV-2 RNA is generally detectable in upper and lower respiratory specimens during the acute phase of infection. The lowest concentration of SARS-CoV-2 viral copies this assay can detect is 250 copies / mL. A negative result does not preclude SARS-CoV-2 infection and should not be used as the sole basis for treatment or other patient management decisions.  A negative result may occur with improper specimen collection / handling, submission of specimen other than nasopharyngeal swab, presence of viral mutation(s) within the areas targeted by this assay, and inadequate number of viral copies (<250 copies / mL). A negative result must be combined with clinical observations, patient history, and epidemiological information.  Fact Sheet for Patients:   RoadLapTop.co.za  Fact Sheet for Healthcare Providers: http://kim-miller.com/  This test is not yet approved or  cleared by the [place] FDA and has been authorized for detection and/or diagnosis of SARS-CoV-2 by FDA under an Emergency Use Authorization (EUA).  This EUA will remain in effect (meaning this test can be used) for the duration of the COVID-19 declaration under Section 564(b)(1) of the Act, 21 U.S.C. section 360bbb-3(b)(1), unless the authorization is terminated or revoked sooner.  Performed at [facility], [address] [place], [place] [address]      Labs: CBC: Recent Labs  Lab 06/02/23 [phone] 06/04/23 0416 06/05/23 0519 06/06/23 0522 06/07/23 0410  WBC 7.1 5.1 4.4 4.9 5.4  HGB  11.3* 10.4* 10.7* 10.8* 11.0*  HCT 33.2* 31.4* 31.3* 31.9* 32.3*  MCV 93.3 93.7 93.2 92.2 92.8  PLT 224 209 194 219 238   Basic Metabolic Panel: Recent Labs  Lab 06/03/23 0550 06/04/23 0416 06/05/23 0519 06/06/23 0522 06/07/23 0410  NA 136 134* 135 134* 138  K 4.0 4.0 3.9 3.7 4.0  CL 104 101 101 100 99  CO2 25 26 26 25 25   GLUCOSE 103* 91 86 87 88  BUN 19 22 20 22  25*  CREATININE 0.55* 0.60* 0.52* 0.48* 0.56*  CALCIUM 8.3* 8.3* 8.5* 8.7* 9.3  MG 1.7 1.9 1.9 1.9 1.9  PHOS 3.0 3.3 3.5 3.0  --    Liver Function Tests: Recent Labs  Lab 06/01/23 1426  AST 35  ALT 37  ALKPHOS 63  BILITOT 0.6  PROT 6.6  ALBUMIN 2.9*   No results for input(s): "LIPASE", "AMYLASE" in the last 168 hours. No results for input(s): "AMMONIA" in the last 168 hours. Cardiac Enzymes: Recent Labs  Lab 06/02/23 1130 06/03/23 0550  CKTOTAL 198 137   BNP (last 3 results) Recent Labs    06/01/23 1427  BNP 1,466.5*   CBG: Recent Labs  Lab 06/02/23 0738  GLUCAP 95    Time spent: 35  minutes  Signed:  Gillis Santa  [facility] 06/07/2023 12:41 PM

## 2023-06-07 NOTE — Progress Notes (Signed)
Physical Therapy Treatment Patient Details Name: [name] MRN: [id number] DOB: 1932-12-08 Today's Date: 06/07/2023   History of Present Illness Patient is a 89 year old male with history of anxiety, depression, A-fib on Eliquis, hypertension, hyperlipidemia, history of sick sinus syndrome status post pacemaker placement, history of B12 deficiency, who presents to the emergency department for chief concerns of generalized weakness, worsening at assisted living facility. Current MD assessment includes: Generalized weakness, chest pain, a-fib, HLD, and hypokalemia.    PT Comments  Pt was pleasant and motivated to participate during the session and put forth good effort throughout. Pt continued to require some physical assistance with functional tasks per below but overall making progress towards goals including being able to amb 3 x 5 feet with no overt LOB or buckling but did require mod lean on the RW for support.  Pt reported no adverse symptoms during the session with SpO2 and HR WNL on room air.  Pt will benefit from continued PT services upon discharge to safely address deficits listed in patient problem list for decreased caregiver assistance and eventual return to PLOF.      If plan is discharge home, recommend the following: A little help with walking and/or transfers;A little help with bathing/dressing/bathroom;Assistance with cooking/housework;Assist for transportation;Help with stairs or ramp for entrance   Can travel by private vehicle     No  Equipment Recommendations  Other (comment) (TBD)    Recommendations for Other Services       Precautions / Restrictions Precautions Precautions: Fall Restrictions Weight Bearing Restrictions: No     Mobility  Bed Mobility Overal bed mobility: Needs Assistance Bed Mobility: Supine to Sit     Supine to sit: Min assist     General bed mobility comments: Min A for RLE management and cues for general sequencing     Transfers Overall transfer level: Needs assistance Equipment used: Rolling walker (2 wheels) Transfers: Sit to/from Stand Sit to Stand: Contact guard assist, From elevated surface, Min assist           General transfer comment: Pt able to stand without physcial assist but needed the bed to be elevated significantly, required min A and heavy use of BUEs on chair arm rests to stand from recliner    Ambulation/Gait Ambulation/Gait assistance: Contact guard assist Gait Distance (Feet): 5 Feet x 3 Assistive device: Rolling walker (2 wheels) Gait Pattern/deviations: Decreased step length - right, Decreased step length - left, Decreased stride length, Step-through pattern Gait velocity: decreased     General Gait Details: Slow, effortful cadence with poor activity tolerance but generally steady without LOB, mod lean on the RW for support   Stairs             Wheelchair Mobility     Tilt Bed    Modified Rankin (Stroke Patients Only)       Balance Overall balance assessment: Needs assistance Sitting-balance support: Feet supported, No upper extremity supported Sitting balance-Leahy Scale: Good     Standing balance support: Reliant on assistive device for balance, Bilateral upper extremity supported, During functional activity Standing balance-Leahy Scale: Fair                              Cognition Arousal: Alert Behavior During Therapy: WFL for tasks assessed/performed Overall Cognitive Status: Within Functional Limits for tasks assessed  Exercises Total Joint Exercises Ankle Circles/Pumps: AROM, Strengthening, Both, 10 reps Quad Sets: Strengthening, Both, 10 reps Gluteal Sets: Both, 10 reps, Strengthening Long Arc Quad: Strengthening, Both, 10 reps Other Exercises Other Exercises: HEP education/review for BLE APs, QS, LAQs, and GS x 10 5-6x/day as able    General Comments         Pertinent Vitals/Pain Pain Assessment Pain Assessment: No/denies pain    Home Living                          Prior Function            PT Goals (current goals can now be found in the care plan section) Progress towards PT goals: Progressing toward goals    Frequency    Min 1X/week      PT Plan      Co-evaluation              AM-PAC PT "6 Clicks" Mobility   Outcome Measure  Help needed turning from your back to your side while in a flat bed without using bedrails?: A Little Help needed moving from lying on your back to sitting on the side of a flat bed without using bedrails?: A Little Help needed moving to and from a bed to a chair (including a wheelchair)?: A Little Help needed standing up from a chair using your arms (e.g., wheelchair or bedside chair)?: A Little Help needed to walk in hospital room?: A Lot Help needed climbing 3-5 steps with a railing? : A Lot 6 Click Score: 16    End of Session Equipment Utilized During Treatment: Gait belt Activity Tolerance: Patient tolerated treatment well Patient left: with call bell/phone within reach;in chair;with chair alarm set Nurse Communication: Mobility status PT Visit Diagnosis: Unsteadiness on feet (R26.81);Muscle weakness (generalized) (M62.81);Difficulty in walking, not elsewhere classified (R26.2)     Time: 1610-9604 PT Time Calculation (min) (ACUTE ONLY): 25 min  Charges:    $Gait Training: 8-22 mins $Therapeutic Exercise: 8-22 mins PT General Charges $$ ACUTE PT VISIT: 1 Visit                    D. Scott Bobbie Valletta PT, DPT 06/07/23, 11:09 AM

## 2023-06-07 NOTE — TOC Progression Note (Signed)
Transition of Care [facility]) - Progression Note    Patient Details  Name: [name] MRN: [id number] Date of Birth: Feb 16, 1933  [facility] CM/SW Contact  Liliana Cline, LCSW Phone Number: 06/07/2023, 9:06 AM  Clinical Narrative:    Auth approved in [facility]. Notified [facility] & MD.         Expected Discharge Plan and Services                                               Social Determinants of Health (SDOH) Interventions SDOH Screenings   Depression (PHQ2-9): Medium Risk (03/06/2023)  Tobacco Use: Medium Risk (06/01/2023)    Readmission Risk Interventions     No data to display

## 2023-06-07 NOTE — TOC Transition Note (Addendum)
Transition of Care [facility]) - CM/SW Discharge Note   Patient Details  Name: [name] MRN: [id number] Date of Birth: 1933-03-24  Transition of Care [facility]) CM/SW Contact:  Liliana Cline, LCSW Phone Number: 06/07/2023, 11:05 AM   Clinical Narrative:    Discharge to [facility] [place] today. Room 701. Confirmed with Admissions Workers [address] and [place]. Updated MD, RN, and Feliz Beam (grandson). Asked RN to call report once DC Summary is in. EMS paperwork completed. EMS will be called when patient is ready for transport.  1:30- [facility] called for transport. Patient is 2nd on the list. RN notified.   Final next level of care: Skilled Nursing Facility Barriers to Discharge: Barriers Resolved   Patient Goals and CMS Choice CMS Medicare.gov Compare Post Acute Care list provided to:: Patient Represenative (must comment) Choice offered to / list presented to :  (grandson Feliz Beam)  Discharge Placement                Patient chooses bed at: [facility] Patient to be transferred to facility by: [facility] Name of family member notified: Feliz Beam Patient and family notified of of transfer: 06/07/23  Discharge Plan and Services Additional resources added to the After Visit Summary for                                       Social Determinants of Health (SDOH) Interventions SDOH Screenings   Depression (PHQ2-9): Medium Risk (03/06/2023)  Tobacco Use: Medium Risk (06/01/2023)     Readmission Risk Interventions     No data to display

## 2023-06-09 LAB — PARANEOPLASTIC AB
AGNA-1: NEGATIVE
Amphiphysin Antibody: NEGATIVE
Anti-Hu Ab: NEGATIVE
Anti-Ri Ab: NEGATIVE
Anti-Yo Ab: NEGATIVE
Antineruonal nuclear Ab Type 3: NEGATIVE
CASPR2 Antibody,Cell-based IFA: NEGATIVE
CRMP-5 IgG: NEGATIVE
Interpretation: NEGATIVE
LGI1 Antibody, Cell-based IFA: NEGATIVE
Purkinje Cell Cyto Ab Type 2: NEGATIVE
Purkinje Cell Cyto Ab Type Tr: NEGATIVE
VGCC Antibody: 11.7 pmol/L (ref 0.0–30.0)

## 2023-06-10 DIAGNOSIS — M6281 Muscle weakness (generalized): Secondary | ICD-10-CM | POA: Diagnosis not present

## 2023-06-12 ENCOUNTER — Encounter: Payer: Self-pay | Admitting: *Deleted

## 2023-06-12 DIAGNOSIS — E559 Vitamin D deficiency, unspecified: Secondary | ICD-10-CM | POA: Diagnosis not present

## 2023-06-12 DIAGNOSIS — I2510 Atherosclerotic heart disease of native coronary artery without angina pectoris: Secondary | ICD-10-CM | POA: Diagnosis not present

## 2023-06-12 DIAGNOSIS — D649 Anemia, unspecified: Secondary | ICD-10-CM | POA: Diagnosis not present

## 2023-06-12 DIAGNOSIS — D518 Other vitamin B12 deficiency anemias: Secondary | ICD-10-CM | POA: Diagnosis not present

## 2023-06-12 DIAGNOSIS — E038 Other specified hypothyroidism: Secondary | ICD-10-CM | POA: Diagnosis not present

## 2023-06-12 DIAGNOSIS — E119 Type 2 diabetes mellitus without complications: Secondary | ICD-10-CM | POA: Diagnosis not present

## 2023-06-12 DIAGNOSIS — I4891 Unspecified atrial fibrillation: Secondary | ICD-10-CM | POA: Diagnosis not present

## 2023-06-12 DIAGNOSIS — Z955 Presence of coronary angioplasty implant and graft: Secondary | ICD-10-CM

## 2023-06-12 DIAGNOSIS — E782 Mixed hyperlipidemia: Secondary | ICD-10-CM | POA: Diagnosis not present

## 2023-06-12 NOTE — Progress Notes (Signed)
Cardiac Individual Treatment Plan  Patient Details  Name: [name] MRN: [id number] Date of Birth: 1933/06/02 Referring Provider:   Flowsheet Row Cardiac Rehab from 03/06/2023 in [facility] Cardiac and Pulmonary Rehab  Referring Provider Dr. Cammy Copa       Initial Encounter Date:  Flowsheet Row Cardiac Rehab from 03/06/2023 in [facility] Cardiac and Pulmonary Rehab  Date 03/06/23       Visit Diagnosis: Status post coronary artery stent placement  Patient's Home Medications on Admission:  Current Outpatient Medications:    acetaminophen (TYLENOL) 325 MG tablet, Take 650 mg by mouth every 6 (six) hours as needed. Reported on 01/17/2016, Disp: , Rfl:    apixaban (ELIQUIS) 5 MG TABS tablet, Take 5 mg by mouth 2 (two) times daily., Disp: , Rfl:    atorvastatin (LIPITOR) 40 MG tablet, Take 80 mg by mouth daily., Disp: , Rfl:    bisacodyl (DULCOLAX) 10 MG suppository, Place 1 suppository (10 mg total) rectally daily as needed for severe constipation., Disp: , Rfl:    bisacodyl (DULCOLAX) 5 MG EC tablet, Take 2 tablets (10 mg total) by mouth 2 (two) times daily., Disp: , Rfl:    clopidogrel (PLAVIX) 75 MG tablet, TAKE ONE TABLET BY MOUTH EVERY DAY FOR BLOOD CLOT PREVENTION FOLLOWING PCI BE SURE TO CHECK WITH YOUR DOCTOR ABOUT HOW LONG YOU SHOULD TAKE THIS MEDICINE, Disp: , Rfl:    finasteride (PROSCAR) 5 MG tablet, Take 1 tablet (5 mg total) by mouth daily., Disp: 90 tablet, Rfl: 3   folic acid (FOLVITE) 1 MG tablet, Take 1 tablet (1 mg total) by mouth daily., Disp: , Rfl:    iron polysaccharides (NIFEREX) 150 MG capsule, Take 1 capsule (150 mg total) by mouth daily., Disp: 90 capsule, Rfl: 0   pantoprazole (PROTONIX) 40 MG tablet, Take 1 tablet (40 mg total) by mouth daily., Disp: , Rfl:    polyethylene glycol (MIRALAX / GLYCOLAX) 17 g packet, Take 17 g by mouth 2 (two) times daily. Skip the dose if no constipation, Disp: , Rfl:    predniSONE (DELTASONE) 10 MG tablet, Take 3 tablets (30 mg  total) by mouth daily for 3 days, THEN 2 tablets (20 mg total) daily for 3 days, THEN 1 tablet (10 mg total) daily for 3 days., Disp: , Rfl:    tamsulosin (FLOMAX) 0.4 MG CAPS capsule, TAKE ONE CAPSULE BY MOUTH EVERY DAY (Patient taking differently: Take 0.4 mg by mouth at bedtime.), Disp: 30 capsule, Rfl: 11   zinc sulfate 220 (50 Zn) MG capsule, Take 1 capsule (220 mg total) by mouth daily., Disp: , Rfl:   Past Medical History: Past Medical History:  Diagnosis Date   Acute diverticulitis 09/2014   [facility]   Allergic rhinitis    BPH (benign prostatic hyperplasia)    CAD (coronary artery disease)    a. 10/2013 MV: low risk; b. 03/2020 Cath: mild-mod nonobs dzs; c. 08/2022 MV: tiny, mildly severe apical lateral ischemia; d. 01/2023 Cath/PCI: LCX 44m (2.25x38 Onyx Frontier DES - dil up to 3mm), OM1 70 (2x15 Onyx Frontier DES); e. 01/2023 relook Cath: LM/LAD no signif dzs, D2 40, LCX/OM1 patent stents, RCA 10p/m/d-->Med Rx.   Cataracts, bilateral    Chronic constipation 01/10/2015   Diastolic dysfunction    a. 09/2009 Echo: Ef 55%, no rwma, GrII DD, mild MR, mildly dil LA, nl RV fxn. PASP 25-84mmHg; b. 06/2022 Echo: EF 60%, GrII DD, nl RV fxn, mild AI.   HOH (hard of hearing)  Hyperlipemia    Hypertension    Osteoarthritis    PAF (paroxysmal atrial fibrillation) (HCC) 09/2009   a. CHA2DS2VASc = 4-->eliquis; b. Prev on flec->d/c 2021 after cath showed nonobs CAD; c. 07/2020 Recurrent AF->Amio-->[facility]). Amio d/c'd early 2022.   Presence of permanent cardiac pacemaker    Sick sinus syndrome (HCC)    a. 10/2009 s/p MDT Adapta ADDRL1 DC PPM; b. 04/2023 s/p Gen change   Sigmoid volvulus (HCC)     Tobacco Use: Social History   Tobacco Use  Smoking Status Former   Passive exposure: Past  Smokeless Tobacco Never  Tobacco Comments   In [employer], quit in 1960's    Labs: Review Flowsheet  More data exists      Latest Ref Rng & Units 01/24/2011 04/29/2015 12/03/2016 10/13/2021 06/03/2023  Labs  for ITP Cardiac and Pulmonary Rehab  Cholestrol 0 - 200 mg/dL 846  962  952  - -  LDL (calc) 0 - 99 mg/dL - 841  324  - -  Direct LDL mg/dL 401.0  - - - -  HDL-C >27.25 mg/dL 36.64  40.34  74.25  - -  Trlycerides 0.0 - 149.0 mg/dL 95.6  38.7  56.4  - -  Hemoglobin A1c 4.8 - 5.6 % - - - 5.5  5.1     Details             Exercise Target Goals: Exercise Program Goal: Individual exercise prescription set using results from initial 6 min walk test and THRR while considering  patient's activity barriers and safety.   Exercise Prescription Goal: Initial exercise prescription builds to 30-45 minutes a day of aerobic activity, 2-3 days per week.  Home exercise guidelines will be given to patient during program as part of exercise prescription that the participant will acknowledge.   Education: Aerobic Exercise: - Group verbal and visual presentation on the components of exercise prescription. Introduces F.I.T.T principle from ACSM for exercise prescriptions.  Reviews F.I.T.T. principles of aerobic exercise including progression. Written material given at graduation. Flowsheet Row Cardiac Rehab from 03/27/2023 in [facility] Cardiac and Pulmonary Rehab  Education need identified 03/06/23  Date 03/13/23  Educator [facility]  Instruction Review Code 1- Verbalizes Understanding       Education: Resistance Exercise: - Group verbal and visual presentation on the components of exercise prescription. Introduces F.I.T.T principle from ACSM for exercise prescriptions  Reviews F.I.T.T. principles of resistance exercise including progression. Written material given at graduation. Flowsheet Row Cardiac Rehab from 03/27/2023 in [facility] Cardiac and Pulmonary Rehab  Date 03/27/23  Educator NT  Instruction Review Code 1- Verbalizes Understanding        Education: Exercise & Equipment Safety: - Individual verbal instruction and demonstration of equipment use and safety with use of the equipment. Flowsheet Row Cardiac  Rehab from 03/27/2023 in [facility] Cardiac and Pulmonary Rehab  Date 03/06/23  Educator [facility]  Instruction Review Code 1- Verbalizes Understanding       Education: Exercise Physiology & General Exercise Guidelines: - Group verbal and written instruction with models to review the exercise physiology of the cardiovascular system and associated critical values. Provides general exercise guidelines with specific guidelines to those with heart or lung disease.  Flowsheet Row Cardiac Rehab from 03/27/2023 in [facility] Cardiac and Pulmonary Rehab  Education need identified 03/06/23       Education: Flexibility, Balance, Mind/Body Relaxation: - Group verbal and visual presentation with interactive activity on the components of exercise prescription. Introduces F.I.T.T principle from ACSM  for exercise prescriptions. Reviews F.I.T.T. principles of flexibility and balance exercise training including progression. Also discusses the mind body connection.  Reviews various relaxation techniques to help reduce and manage stress (i.e. Deep breathing, progressive muscle relaxation, and visualization). Balance handout provided to take home. Written material given at graduation. Flowsheet Row Cardiac Rehab from 03/27/2023 in [facility] Cardiac and Pulmonary Rehab  Date 03/27/23  Educator NT  Instruction Review Code 1- Verbalizes Understanding       Activity Barriers & Risk Stratification:  Activity Barriers & Cardiac Risk Stratification - 03/06/23 1336       Activity Barriers & Cardiac Risk Stratification   Activity Barriers Joint Problems;Muscular Weakness    Cardiac Risk Stratification Moderate             6 Minute Walk:  6 Minute Walk     Row Name 03/06/23 1332         6 Minute Walk   Phase Initial     Distance 155 feet     Walk Time 1.42 minutes     # of Rest Breaks 1     MPH 0.29     METS 1.97     RPE 13     Perceived Dyspnea  1     VO2 Peak 6.9     Symptoms Yes (comment)  weak legs-stopped test  after 1:25     Comments weak legs-stopped test at 1:25     Resting HR 65 bpm     Resting BP 130/62     Resting Oxygen Saturation  98 %     Exercise Oxygen Saturation  during 6 min walk 99 %     Max Ex. HR 81 bpm     Max Ex. BP 138/70     2 Minute Post BP 128/64              Oxygen Initial Assessment:   Oxygen Re-Evaluation:   Oxygen Discharge (Final Oxygen Re-Evaluation):   Initial Exercise Prescription:  Initial Exercise Prescription - 03/06/23 1300       Date of Initial Exercise RX and Referring Provider   Date 03/06/23    Referring Provider Dr. Cammy Copa      Oxygen   Maintain Oxygen Saturation 88% or higher      Treadmill   MPH 0.5    Grade 0    Minutes 15   eventual goal of 15 min. Start with intervals, resting as often as needed.   METs 1.4      Recumbant Bike   Level 1    RPM 50    Watts 15    Minutes 15      NuStep   Level 1    SPM 80    Minutes 15    METs 1.97      Track   Laps 5    Minutes 15    METs 1.27      Prescription Details   Frequency (times per week) 3    Duration Progress to 30 minutes of continuous aerobic without signs/symptoms of physical distress      Intensity   THRR 40-80% of Max Heartrate 91-118    Ratings of Perceived Exertion 11-13    Perceived Dyspnea 0-4      Progression   Progression Continue to progress workloads to maintain intensity without signs/symptoms of physical distress.      Resistance Training   Training Prescription Yes    Weight 4    Reps 10-15  Perform Capillary Blood Glucose checks as needed.  Exercise Prescription Changes:   Exercise Prescription Changes     Row Name 03/06/23 1300 03/11/23 1500 03/27/23 1400 04/10/23 0800 04/25/23 1000     Response to Exercise   Blood Pressure (Admit) 130/62 126/64 122/66 122/70 104/60   Blood Pressure (Exercise) 138/70 130/60 128/66 118/60 144/72   Blood Pressure (Exit) 128/64 122/60 124/62 114/60 108/58   Heart Rate (Admit)  65 bpm 68 bpm 72 bpm 69 bpm 76 bpm   Heart Rate (Exercise) 81 bpm 67 bpm 74 bpm 96 bpm 92 bpm   Heart Rate (Exit) 65 bpm 57 bpm 65 bpm 71 bpm 62 bpm   Oxygen Saturation (Admit) 98 % -- -- -- --   Oxygen Saturation (Exercise) 99 % -- -- -- --   Oxygen Saturation (Exit) 99 % -- -- -- --   Rating of Perceived Exertion (Exercise) 13 16 15 15 15    Perceived Dyspnea (Exercise) 1 -- -- -- --   Symptoms weak legs- stopped test at 1:25 leg pain leg pain leg pain leg pain   Comments 6 MWT results First full day of exercise -- -- --   Duration -- Progress to 30 minutes of  aerobic without signs/symptoms of physical distress Progress to 30 minutes of  aerobic without signs/symptoms of physical distress Progress to 30 minutes of  aerobic without signs/symptoms of physical distress Progress to 30 minutes of  aerobic without signs/symptoms of physical distress   Intensity -- THRR unchanged THRR unchanged THRR unchanged THRR unchanged     Progression   Progression -- Continue to progress workloads to maintain intensity without signs/symptoms of physical distress. Continue to progress workloads to maintain intensity without signs/symptoms of physical distress. Continue to progress workloads to maintain intensity without signs/symptoms of physical distress. Continue to progress workloads to maintain intensity without signs/symptoms of physical distress.   Average METs -- 1.8 2.26 1.92 2.25     Resistance Training   Training Prescription -- Yes Yes Yes Yes   Weight -- 4 4 lb 4 lb 3 lb   Reps -- 10-15 10-15 10-15 10-15     Interval Training   Interval Training -- No No No No     NuStep   Level -- 1 1 1 1    Minutes -- 30 30 30 30    METs -- 1.8 3.7 2.1 2.6     T5 Nustep   Level -- -- 1 1 1    Minutes -- -- 30 30 15    METs -- -- 2 2 1.9     Oxygen   Maintain Oxygen Saturation -- 88% or higher 88% or higher 88% or higher 88% or higher            Exercise Comments:   Exercise Comments     Row  Name 03/08/23 1026           Exercise Comments First full day of exercise!  Patient was oriented to gym and equipment including functions, settings, policies, and procedures.  Patient's individual exercise prescription and treatment plan were reviewed.  All starting workloads were established based on the results of the 6 minute walk test done at initial orientation visit.  The plan for exercise progression was also introduced and progression will be customized based on patient's performance and goals.                Exercise Goals and Review:   Exercise Goals     Row Name 03/06/23  1357             Exercise Goals   Increase Physical Activity Yes       Intervention Provide advice, education, support and counseling about physical activity/exercise needs.;Develop an individualized exercise prescription for aerobic and resistive training based on initial evaluation findings, risk stratification, comorbidities and participant's personal goals.       Expected Outcomes Short Term: Attend rehab on a regular basis to increase amount of physical activity.;Long Term: Add in home exercise to make exercise part of routine and to increase amount of physical activity.;Long Term: Exercising regularly at least 3-5 days a week.       Increase Strength and Stamina Yes       Intervention Provide advice, education, support and counseling about physical activity/exercise needs.;Develop an individualized exercise prescription for aerobic and resistive training based on initial evaluation findings, risk stratification, comorbidities and participant's personal goals.       Expected Outcomes Short Term: Increase workloads from initial exercise prescription for resistance, speed, and METs.;Short Term: Perform resistance training exercises routinely during rehab and add in resistance training at home;Long Term: Improve cardiorespiratory fitness, muscular endurance and strength as measured by increased METs and  functional capacity ( )       Able to understand and use rate of perceived exertion (RPE) scale Yes       Intervention Provide education and explanation on how to use RPE scale       Expected Outcomes Short Term: Able to use RPE daily in rehab to express subjective intensity level;Long Term:  Able to use RPE to guide intensity level when exercising independently       Able to understand and use Dyspnea scale Yes       Intervention Provide education and explanation on how to use Dyspnea scale       Expected Outcomes Short Term: Able to use Dyspnea scale daily in rehab to express subjective sense of shortness of breath during exertion;Long Term: Able to use Dyspnea scale to guide intensity level when exercising independently       Knowledge and understanding of Target Heart Rate Range (THRR) Yes       Intervention Provide education and explanation of THRR including how the numbers were predicted and where they are located for reference       Expected Outcomes Short Term: Able to state/look up THRR;Long Term: Able to use THRR to govern intensity when exercising independently;Short Term: Able to use daily as guideline for intensity in rehab       Able to check pulse independently Yes       Intervention Provide education and demonstration on how to check pulse in carotid and radial arteries.;Review the importance of being able to check your own pulse for safety during independent exercise       Expected Outcomes Short Term: Able to explain why pulse checking is important during independent exercise;Long Term: Able to check pulse independently and accurately       Understanding of Exercise Prescription Yes       Intervention Provide education, explanation, and written materials on patient's individual exercise prescription       Expected Outcomes Short Term: Able to explain program exercise prescription;Long Term: Able to explain home exercise prescription to exercise independently                 Exercise Goals Re-Evaluation :  Exercise Goals Re-Evaluation     Row Name 03/08/23 1026 03/11/23 1507 03/27/23 1437  04/10/23 0819 04/25/23 1010     Exercise Goal Re-Evaluation   Exercise Goals Review Able to understand and use rate of perceived exertion (RPE) scale;Able to understand and use Dyspnea scale;Knowledge and understanding of Target Heart Rate Range (THRR);Understanding of Exercise Prescription Increase Physical Activity;Increase Strength and Stamina;Understanding of Exercise Prescription Increase Physical Activity;Increase Strength and Stamina;Understanding of Exercise Prescription Increase Physical Activity;Increase Strength and Stamina;Understanding of Exercise Prescription Increase Physical Activity;Increase Strength and Stamina;Understanding of Exercise Prescription   Comments Reviewed RPE  and dyspnea scale, THR and program prescription with pt today.  Pt voiced understanding and was given a copy of goals to take home. [name] is off to a good start in the program. He did well on the T4 nustep at level 1 during his first session. He has not done any walking yet due to having pain and weakness in his legs. He did do well with 4 lb hand weights for resistance training as well. We will continue to monitor his progress in the program. [name] is doing well in rehab. He has been doing 30 minutes at level 1 on either the T4 nustep or the T5 nustep while in the program. He also has continued to do well with 4 lb hand weights for resistance training. He has not been doing any walking in rehab. We will continue to monitor his progress in the program. [name] is doing well in rehab. He has been doing 30 minutes on both the T4 nustep and T5 nustep. He has not started doing any walking in the program due to leg pain. We will continue to monitor his progress in the program. [name] has only attended two sessions since the last review. He continues to work at level 1 on the T4 and T5 nustep. He has not started  walking as part of his exercise in the program due to leg pain. We will continue to monitor (his/her) progress in the program.   Expected Outcomes Short: Use RPE daily to regulate intensity. Long: Follow program prescription in THR. Short: Continue to follow current exercise prescription. Long: Continue to improve strength and stamina. Short: Try level 2 on T4 nustep. Long: Continue to improve strength and stamina. Short: Try level 2 on T4 nustep. Long: Continue to improve strength and stamina. Short: Try level 2 on T4 nustep. Long: Continue exercise to improve strength and stamina.    Row Name 05/21/23 1437 06/04/23 1518           Exercise Goal Re-Evaluation   Exercise Goals Review Increase Physical Activity;Increase Strength and Stamina;Understanding of Exercise Prescription Increase Physical Activity;Increase Strength and Stamina;Understanding of Exercise Prescription      Comments [name] has not attended since last review, as he is on medical hold. We will continue to monitor his progress upon return to the program. [name] has not attended since last review, as he is on medical hold. We will continue to monitor his progress upon return to the program.      Expected Outcomes Short: Return to the program. Long: Continue to exercise to improve strength and stamina. Short: Return to the program. Long: Continue to exercise to improve strength and stamina.               Discharge Exercise Prescription (Final Exercise Prescription Changes):  Exercise Prescription Changes - 04/25/23 1000       Response to Exercise   Blood Pressure (Admit) 104/60    Blood Pressure (Exercise) 144/72    Blood Pressure (Exit) 108/58  Heart Rate (Admit) 76 bpm    Heart Rate (Exercise) 92 bpm    Heart Rate (Exit) 62 bpm    Rating of Perceived Exertion (Exercise) 15    Symptoms leg pain    Duration Progress to 30 minutes of  aerobic without signs/symptoms of physical distress    Intensity THRR unchanged       Progression   Progression Continue to progress workloads to maintain intensity without signs/symptoms of physical distress.    Average METs 2.25      Resistance Training   Training Prescription Yes    Weight 3 lb    Reps 10-15      Interval Training   Interval Training No      NuStep   Level 1    Minutes 30    METs 2.6      T5 Nustep   Level 1    Minutes 15    METs 1.9      Oxygen   Maintain Oxygen Saturation 88% or higher             Nutrition:  Target Goals: Understanding of nutrition guidelines, daily intake of sodium 1500mg , cholesterol 200mg , calories 30% from fat and 7% or less from saturated fats, daily to have 5 or more servings of fruits and vegetables.  Education: All About Nutrition: -Group instruction provided by verbal, written material, interactive activities, discussions, models, and posters to present general guidelines for heart healthy nutrition including fat, fiber, MyPlate, the role of sodium in heart healthy nutrition, utilization of the nutrition label, and utilization of this knowledge for meal planning. Follow up email sent as well. Written material given at graduation.   Biometrics:  Pre Biometrics - 03/06/23 1358       Pre Biometrics   Height 5' 9.5" (1.765 m)    Weight 146 lb 3.2 oz (66.3 kg)    Waist Circumference 34 inches    Hip Circumference 37.5 inches    Waist to Hip Ratio 0.91 %    BMI (Calculated) 21.29    Single Leg Stand 1.97 seconds              Nutrition Therapy Plan and Nutrition Goals:  Nutrition Therapy & Goals - 03/06/23 1400       Intervention Plan   Intervention Prescribe, educate and counsel regarding individualized specific dietary modifications aiming towards targeted core components such as weight, hypertension, lipid management, diabetes, heart failure and other comorbidities.    Expected Outcomes Short Term Goal: Understand basic principles of dietary content, such as calories, fat, sodium,  cholesterol and nutrients.;Short Term Goal: A plan has been developed with personal nutrition goals set during dietitian appointment.;Long Term Goal: Adherence to prescribed nutrition plan.             Nutrition Assessments:  MEDIFICTS Score Key: ?70 Need to make dietary changes  40-70 Heart Healthy Diet ? 40 Therapeutic Level Cholesterol Diet  Flowsheet Row Cardiac Rehab from 03/06/2023 in [facility] Cardiac and Pulmonary Rehab  Picture Your Plate Total Score on Admission 39      Picture Your Plate Scores: <81 Unhealthy dietary pattern with much room for improvement. 41-50 Dietary pattern unlikely to meet recommendations for good health and room for improvement. 51-60 More healthful dietary pattern, with some room for improvement.  >60 Healthy dietary pattern, although there may be some specific behaviors that could be improved.    Nutrition Goals Re-Evaluation:   Nutrition Goals Discharge (Final Nutrition Goals Re-Evaluation):   Psychosocial:  Target Goals: Acknowledge presence or absence of significant depression and/or stress, maximize coping skills, provide positive support system. Participant is able to verbalize types and ability to use techniques and skills needed for reducing stress and depression.   Education: Stress, Anxiety, and Depression - Group verbal and visual presentation to define topics covered.  Reviews how body is impacted by stress, anxiety, and depression.  Also discusses healthy ways to reduce stress and to treat/manage anxiety and depression.  Written material given at graduation.   Education: Sleep Hygiene -Provides group verbal and written instruction about how sleep can affect your health.  Define sleep hygiene, discuss sleep cycles and impact of sleep habits. Review good sleep hygiene tips.    Initial Review & Psychosocial Screening:  Initial Psych Review & Screening - 02/28/23 1443       Initial Review   Current issues with Current Stress  Concerns    Comments not feeling better after stents      Family Dynamics   Good Support System? Yes   wife     Barriers   Psychosocial barriers to participate in program There are no identifiable barriers or psychosocial needs.;The patient should benefit from training in stress management and relaxation.      Screening Interventions   Interventions Encouraged to exercise;Provide feedback about the scores to participant;To provide support and resources with identified psychosocial needs    Expected Outcomes Long Term Goal: Stressors or current issues are controlled or eliminated.;Short Term goal: Utilizing psychosocial counselor, staff and physician to assist with identification of specific Stressors or current issues interfering with healing process. Setting desired goal for each stressor or current issue identified.;Short Term goal: Identification and review with participant of any Quality of Life or Depression concerns found by scoring the questionnaire.;Long Term goal: The participant improves quality of Life and PHQ9 Scores as seen by post scores and/or verbalization of changes             Quality of Life Scores:   Quality of Life - 03/06/23 1358       Quality of Life   Select Quality of Life      Quality of Life Scores   Health/Function Pre 15.2 %    Socioeconomic Pre 28.93 %    Psych/Spiritual Pre 21.21 %    Family Pre 30 %    GLOBAL Pre 21.44 %            Scores of 19 and below usually indicate a poorer quality of life in these areas.  A difference of  2-3 points is a clinically meaningful difference.  A difference of 2-3 points in the total score of the Quality of Life Index has been associated with significant improvement in overall quality of life, self-image, physical symptoms, and general health in studies assessing change in quality of life.  PHQ-9: Review Flowsheet  More data exists      03/06/2023 10/13/2021 08/26/2019 11/28/2016 04/29/2015  Depression screen  PHQ 2/9  Decreased Interest 1 0 0 0 0  Down, Depressed, Hopeless 0 0 0 0 0  PHQ - 2 Score 1 0 0 0 0  Altered sleeping 0 - - - -  Tired, decreased energy 3 - - - -  Change in appetite 0 - - - -  Feeling bad or failure about yourself  1 - - - -  Trouble concentrating 0 - - - -  Moving slowly or fidgety/restless 0 - - - -  Suicidal thoughts 0 - - - -  PHQ-9 Score 5 - - - -  Difficult doing work/chores Somewhat difficult - - - -    Details           Interpretation of Total Score  Total Score Depression Severity:  1-4 = Minimal depression, 5-9 = Mild depression, 10-14 = Moderate depression, 15-19 = Moderately severe depression, 20-27 = Severe depression   Psychosocial Evaluation and Intervention:  Psychosocial Evaluation - 02/28/23 1446       Psychosocial Evaluation & Interventions   Interventions Encouraged to exercise with the program and follow exercise prescription;Stress management education    Comments Mr. Kalan is coming to the program after stent placement. He states that he unfortunately is not feeling as well as he thought he would be after the stent. He still feels tired. He is trying to be patient with his health, but it is stressful that he still doesn't have energy. He states he is eating and sleeping well.  He is hopeful this program will help boost his stamina. His wife is his main support system.    Expected Outcomes Short: attend cardiac rehab for education and exercise. Long: develop and maintain positive self care habits.    Continue Psychosocial Services  Follow up required by staff             Psychosocial Re-Evaluation:   Psychosocial Discharge (Final Psychosocial Re-Evaluation):   Vocational Rehabilitation: Provide vocational rehab assistance to qualifying candidates.   Vocational Rehab Evaluation & Intervention:  Vocational Rehab - 02/28/23 1446       Initial Vocational Rehab Evaluation & Intervention   Assessment shows need for Vocational  Rehabilitation No             Education: Education Goals: Education classes will be provided on a variety of topics geared toward better understanding of heart health and risk factor modification. Participant will state understanding/return demonstration of topics presented as noted by education test scores.  Learning Barriers/Preferences:  Learning Barriers/Preferences - 02/28/23 1446       Learning Barriers/Preferences   Learning Barriers Hearing    Learning Preferences Individual Instruction             General Cardiac Education Topics:  AED/CPR: - Group verbal and written instruction with the use of models to demonstrate the basic use of the AED with the basic ABC's of resuscitation.   Anatomy and Cardiac Procedures: - Group verbal and visual presentation and models provide information about basic cardiac anatomy and function. Reviews the testing methods done to diagnose heart disease and the outcomes of the test results. Describes the treatment choices: Medical Management, Angioplasty, or Coronary Bypass Surgery for treating various heart conditions including Myocardial Infarction, Angina, Valve Disease, and Cardiac Arrhythmias.  Written material given at graduation. Flowsheet Row Cardiac Rehab from 03/27/2023 in [facility] Cardiac [facility]  Education need identified 03/06/23       Medication Safety: - Group verbal and visual instruction to review commonly prescribed medications for heart and lung disease. Reviews the medication, class of the drug, and side effects. Includes the steps to properly store meds and maintain the prescription regimen.  Written material given at graduation.   Intimacy: - Group verbal instruction through game format to discuss how heart and lung disease can affect sexual intimacy. Written material given at graduation.. Flowsheet Row Cardiac Rehab from 03/27/2023 in [facility] Cardiac and Pulmonary Rehab  Date 03/13/23  Educator [facility]   Instruction Review Code 1- Verbalizes Understanding       Know  Your Numbers and Heart Failure: - Group verbal and visual instruction to discuss disease risk factors for cardiac and pulmonary disease and treatment options.  Reviews associated critical values for Overweight/Obesity, Hypertension, Cholesterol, and Diabetes.  Discusses basics of heart failure: signs/symptoms and treatments.  Introduces Heart Failure Zone chart for action plan for heart failure.  Written material given at graduation.   Infection Prevention: - Provides verbal and written material to individual with discussion of infection control including proper hand washing and proper equipment cleaning during exercise session. Flowsheet Row Cardiac Rehab from 03/27/2023 in [facility] Cardiac and Pulmonary Rehab  Date 03/06/23  Educator [facility]  Instruction Review Code 1- Verbalizes Understanding       Falls Prevention: - Provides verbal and written material to individual with discussion of falls prevention and safety. Flowsheet Row Cardiac Rehab from 03/27/2023 in [facility] Cardiac and Pulmonary Rehab  Date 03/06/23  Educator [facility]  Instruction Review Code 1- Verbalizes Understanding       Other: -Provides group and verbal instruction on various topics (see comments)   Knowledge Questionnaire Score:  Knowledge Questionnaire Score - 03/06/23 1358       Knowledge Questionnaire Score   Pre Score 22/26             Core Components/Risk Factors/Patient Goals at Admission:  Personal Goals and Risk Factors at Admission - 03/06/23 1400       Core Components/Risk Factors/Patient Goals on Admission    Weight Management Yes    Intervention Weight Management: Develop a combined nutrition and exercise program designed to reach desired caloric intake, while maintaining appropriate intake of nutrient and fiber, sodium and fats, and appropriate energy expenditure required for the weight goal.;Weight Management: Provide education and  appropriate resources to help participant work on and attain dietary goals.    Admit Weight 146 lb 3.2 oz (66.3 kg)    Goal Weight: Short Term 150 lb (68 kg)    Goal Weight: Long Term 155 lb (70.3 kg)    Expected Outcomes Short Term: Continue to assess and modify interventions until short term weight is achieved;Long Term: Adherence to nutrition and physical activity/exercise program aimed toward attainment of established weight goal;Weight Gain: Understanding of general recommendations for a high calorie, high protein meal plan that promotes weight gain by distributing calorie intake throughout the day with the consumption for 4-5 meals, snacks, and/or supplements;Understanding of distribution of calorie intake throughout the day with the consumption of 4-5 meals/snacks;Understanding recommendations for meals to include 15-35% energy as protein, 25-35% energy from fat, 35-60% energy from carbohydrates, less than 200mg  of dietary cholesterol, 20-35 gm of total fiber daily    Hypertension Yes    Intervention Provide education on lifestyle modifcations including regular physical activity/exercise, weight management, moderate sodium restriction and increased consumption of fresh fruit, vegetables, and low fat dairy, alcohol moderation, and smoking cessation.;Monitor prescription use compliance.    Expected Outcomes Short Term: Continued assessment and intervention until BP is < 140/66mm HG in hypertensive participants. < 130/55mm HG in hypertensive participants with diabetes, heart failure or chronic kidney disease.;Long Term: Maintenance of blood pressure at goal levels.    Lipids Yes    Intervention Provide education and support for participant on nutrition & aerobic/resistive exercise along with prescribed medications to achieve LDL 70mg , HDL >40mg .    Expected Outcomes Short Term: Participant states understanding of desired cholesterol values and is compliant with medications prescribed. Participant is  following exercise prescription and nutrition guidelines.;Long Term: Cholesterol controlled with medications as  prescribed, with individualized exercise RX and with personalized nutrition plan. Value goals: LDL < 70mg , HDL > 40 mg.             Education:Diabetes - Individual verbal and written instruction to review signs/symptoms of diabetes, desired ranges of glucose level fasting, after meals and with exercise. Acknowledge that pre and post exercise glucose checks will be done for 3 sessions at entry of program.   Core Components/Risk Factors/Patient Goals Review:    Core Components/Risk Factors/Patient Goals at Discharge (Final Review):    ITP Comments:  ITP Comments     Row Name 02/28/23 1433 03/06/23 1331 03/08/23 1026 03/20/23 0836 04/16/23 1413   ITP Comments Initial phone call completed. Diagnosis can be found in [facility] Paperwork in media tab. EP Orientation scheduled for Wednesday 5/29 10:30. Completed and gym orientation. Initial ITP created and sent for review to Dr. Bethann Punches, Medical Director. First full day of exercise!  Patient was oriented to gym and equipment including functions, settings, policies, and procedures.  Patient's individual exercise prescription and treatment plan were reviewed.  All starting workloads were established based on the results of the 6 minute walk test done at initial orientation visit.  The plan for exercise progression was also introduced and progression will be customized based on patient's performance and goals. 30 Day review completed. Medical Director ITP review done, changes made as directed, and signed approval by Medical Director.   new to program 30 Day review completed. Medical Director ITP review done, changes made as directed, and signed approval by Medical Director.    last visit 6/24    Row Name 05/06/23 1522 05/08/23 0946 05/15/23 1131 06/03/23 1659 06/12/23 0947   ITP Comments Called and spoke with [name], he was not at rehab  today. He has continued decline in strength and states he can hardly walk. [facility] [name] says he is unable to complete rehab program at this time until they can figure out why he is losing his strength. Will discharge from the program at this time, and [name] understands he may restart in the future with a new referral if able. [name] Elease Hashimoto called today with [name] and requested to be placed on a medical hold. Pt will be attending a long term rehab facility for 30 days but would like to return to our program upon completion of his time there. We infromed Pt and Pt's daughter that he will need to keep [place] updated on when he plans to return to cardiac rehab. We will place Pt on medical hold at this time. 30 Day review completed. Medical Director ITP review done, changes made as directed, and signed approval by Medical Director.   medical hold Pt is currently admitted since 8/24. Will follow up with pt/daughter after discharge to discuss plan. 30 Day review completed. Medical Director ITP review done, changes made as directed, and signed approval by Medical Director.            Comments:

## 2023-06-13 DIAGNOSIS — I4891 Unspecified atrial fibrillation: Secondary | ICD-10-CM | POA: Diagnosis not present

## 2023-06-13 DIAGNOSIS — R110 Nausea: Secondary | ICD-10-CM | POA: Diagnosis not present

## 2023-06-13 DIAGNOSIS — M6281 Muscle weakness (generalized): Secondary | ICD-10-CM | POA: Diagnosis not present

## 2023-06-17 DIAGNOSIS — M6281 Muscle weakness (generalized): Secondary | ICD-10-CM | POA: Diagnosis not present

## 2023-06-17 DIAGNOSIS — R300 Dysuria: Secondary | ICD-10-CM | POA: Diagnosis not present

## 2023-06-18 LAB — MISC LABCORP TEST (SEND OUT): Labcorp test code: 520014

## 2023-06-19 DIAGNOSIS — R300 Dysuria: Secondary | ICD-10-CM | POA: Diagnosis not present

## 2023-06-19 DIAGNOSIS — R1310 Dysphagia, unspecified: Secondary | ICD-10-CM | POA: Diagnosis not present

## 2023-06-19 DIAGNOSIS — B370 Candidal stomatitis: Secondary | ICD-10-CM | POA: Diagnosis not present

## 2023-06-20 DIAGNOSIS — N3000 Acute cystitis without hematuria: Secondary | ICD-10-CM | POA: Diagnosis not present

## 2023-06-20 DIAGNOSIS — R051 Acute cough: Secondary | ICD-10-CM | POA: Diagnosis not present

## 2023-06-20 DIAGNOSIS — B370 Candidal stomatitis: Secondary | ICD-10-CM | POA: Diagnosis not present

## 2023-06-24 ENCOUNTER — Other Ambulatory Visit: Payer: Self-pay

## 2023-06-24 ENCOUNTER — Encounter: Payer: Self-pay | Admitting: *Deleted

## 2023-06-24 ENCOUNTER — Inpatient Hospital Stay
Admission: EM | Admit: 2023-06-24 | Discharge: 2023-07-01 | DRG: 689 | Disposition: A | Discharge status: Hospice | Payer: No Typology Code available for payment source | Attending: Internal Medicine | Admitting: Internal Medicine

## 2023-06-24 DIAGNOSIS — Z87440 Personal history of urinary (tract) infections: Secondary | ICD-10-CM

## 2023-06-24 DIAGNOSIS — Z885 Allergy status to narcotic agent status: Secondary | ICD-10-CM

## 2023-06-24 DIAGNOSIS — Z515 Encounter for palliative care: Secondary | ICD-10-CM

## 2023-06-24 DIAGNOSIS — L89152 Pressure ulcer of sacral region, stage 2: Secondary | ICD-10-CM | POA: Diagnosis present

## 2023-06-24 DIAGNOSIS — I959 Hypotension, unspecified: Secondary | ICD-10-CM | POA: Diagnosis present

## 2023-06-24 DIAGNOSIS — Z7901 Long term (current) use of anticoagulants: Secondary | ICD-10-CM

## 2023-06-24 DIAGNOSIS — Z961 Presence of intraocular lens: Secondary | ICD-10-CM | POA: Diagnosis present

## 2023-06-24 DIAGNOSIS — R627 Adult failure to thrive: Secondary | ICD-10-CM | POA: Diagnosis present

## 2023-06-24 DIAGNOSIS — L8990 Pressure ulcer of unspecified site, unspecified stage: Secondary | ICD-10-CM | POA: Insufficient documentation

## 2023-06-24 DIAGNOSIS — Z950 Presence of cardiac pacemaker: Secondary | ICD-10-CM

## 2023-06-24 DIAGNOSIS — Z955 Presence of coronary angioplasty implant and graft: Secondary | ICD-10-CM

## 2023-06-24 DIAGNOSIS — J309 Allergic rhinitis, unspecified: Secondary | ICD-10-CM | POA: Diagnosis present

## 2023-06-24 DIAGNOSIS — E876 Hypokalemia: Secondary | ICD-10-CM | POA: Diagnosis present

## 2023-06-24 DIAGNOSIS — N390 Urinary tract infection, site not specified: Secondary | ICD-10-CM | POA: Diagnosis not present

## 2023-06-24 DIAGNOSIS — Z66 Do not resuscitate: Secondary | ICD-10-CM | POA: Diagnosis present

## 2023-06-24 DIAGNOSIS — Z87891 Personal history of nicotine dependence: Secondary | ICD-10-CM

## 2023-06-24 DIAGNOSIS — R54 Age-related physical debility: Secondary | ICD-10-CM | POA: Diagnosis present

## 2023-06-24 DIAGNOSIS — Z8249 Family history of ischemic heart disease and other diseases of the circulatory system: Secondary | ICD-10-CM

## 2023-06-24 DIAGNOSIS — E785 Hyperlipidemia, unspecified: Secondary | ICD-10-CM | POA: Diagnosis present

## 2023-06-24 DIAGNOSIS — N400 Enlarged prostate without lower urinary tract symptoms: Secondary | ICD-10-CM | POA: Diagnosis present

## 2023-06-24 DIAGNOSIS — I10 Essential (primary) hypertension: Secondary | ICD-10-CM | POA: Diagnosis present

## 2023-06-24 DIAGNOSIS — Z7902 Long term (current) use of antithrombotics/antiplatelets: Secondary | ICD-10-CM

## 2023-06-24 DIAGNOSIS — Z681 Body mass index (BMI) 19 or less, adult: Secondary | ICD-10-CM

## 2023-06-24 DIAGNOSIS — I495 Sick sinus syndrome: Secondary | ICD-10-CM | POA: Diagnosis present

## 2023-06-24 DIAGNOSIS — Z79899 Other long term (current) drug therapy: Secondary | ICD-10-CM

## 2023-06-24 DIAGNOSIS — R531 Weakness: Principal | ICD-10-CM

## 2023-06-24 DIAGNOSIS — I480 Paroxysmal atrial fibrillation: Secondary | ICD-10-CM | POA: Diagnosis present

## 2023-06-24 DIAGNOSIS — Z9049 Acquired absence of other specified parts of digestive tract: Secondary | ICD-10-CM

## 2023-06-24 DIAGNOSIS — Z9842 Cataract extraction status, left eye: Secondary | ICD-10-CM

## 2023-06-24 DIAGNOSIS — R634 Abnormal weight loss: Secondary | ICD-10-CM | POA: Diagnosis present

## 2023-06-24 DIAGNOSIS — B370 Candidal stomatitis: Secondary | ICD-10-CM | POA: Diagnosis not present

## 2023-06-24 DIAGNOSIS — L89622 Pressure ulcer of left heel, stage 2: Secondary | ICD-10-CM | POA: Diagnosis present

## 2023-06-24 DIAGNOSIS — Z888 Allergy status to other drugs, medicaments and biological substances status: Secondary | ICD-10-CM

## 2023-06-24 DIAGNOSIS — H9190 Unspecified hearing loss, unspecified ear: Secondary | ICD-10-CM | POA: Diagnosis present

## 2023-06-24 DIAGNOSIS — M25551 Pain in right hip: Secondary | ICD-10-CM | POA: Diagnosis not present

## 2023-06-24 DIAGNOSIS — N3000 Acute cystitis without hematuria: Secondary | ICD-10-CM | POA: Diagnosis not present

## 2023-06-24 DIAGNOSIS — I2510 Atherosclerotic heart disease of native coronary artery without angina pectoris: Secondary | ICD-10-CM | POA: Diagnosis present

## 2023-06-24 DIAGNOSIS — E43 Unspecified severe protein-calorie malnutrition: Secondary | ICD-10-CM | POA: Diagnosis present

## 2023-06-24 DIAGNOSIS — L89612 Pressure ulcer of right heel, stage 2: Secondary | ICD-10-CM | POA: Diagnosis present

## 2023-06-24 DIAGNOSIS — Z880 Allergy status to penicillin: Secondary | ICD-10-CM

## 2023-06-24 LAB — HEPATIC FUNCTION PANEL
ALT: 57 U/L — ABNORMAL HIGH (ref 0–44)
AST: 39 U/L (ref 15–41)
Albumin: 2.6 g/dL — ABNORMAL LOW (ref 3.5–5.0)
Alkaline Phosphatase: 90 U/L (ref 38–126)
Bilirubin, Direct: 0.2 mg/dL (ref 0.0–0.2)
Indirect Bilirubin: 0.7 mg/dL (ref 0.3–0.9)
Total Bilirubin: 0.9 mg/dL (ref 0.3–1.2)
Total Protein: 6 g/dL — ABNORMAL LOW (ref 6.5–8.1)

## 2023-06-24 LAB — BASIC METABOLIC PANEL
Anion gap: 6 (ref 5–15)
BUN: 26 mg/dL — ABNORMAL HIGH (ref 8–23)
CO2: 29 mmol/L (ref 22–32)
Calcium: 8.6 mg/dL — ABNORMAL LOW (ref 8.9–10.3)
Chloride: 103 mmol/L (ref 98–111)
Creatinine, Ser: 0.57 mg/dL — ABNORMAL LOW (ref 0.61–1.24)
GFR, Estimated: >60 mL/min (ref >60)
Glucose, Bld: 109 mg/dL — ABNORMAL HIGH (ref 70–99)
Potassium: 3.5 mmol/L (ref 3.5–5.1)
Sodium: 138 mmol/L (ref 135–145)

## 2023-06-24 LAB — CBC
HCT: 41.2 % (ref 39.0–52.0)
Hemoglobin: 13.9 g/dL (ref 13.0–17.0)
MCH: 32 pg (ref 26.0–34.0)
MCHC: 33.7 g/dL (ref 30.0–36.0)
MCV: 94.9 fL (ref 80.0–100.0)
Platelets: 159 10*3/uL (ref 150–400)
RBC: 4.34 MIL/uL (ref 4.22–5.81)
RDW: 14.8 % (ref 11.5–15.5)
WBC: 6.2 10*3/uL (ref 4.0–10.5)
nRBC: 0 % (ref 0.0–0.2)

## 2023-06-24 LAB — LIPASE, BLOOD: Lipase: 25 U/L (ref 11–51)

## 2023-06-24 MED ORDER — SODIUM CHLORIDE 0.9 % IV BOLUS
1000.0000 mL | Freq: Once | INTRAVENOUS | Status: AC
  Administered 2023-06-25: 1000 mL via INTRAVENOUS

## 2023-06-24 MED ORDER — ONDANSETRON HCL 4 MG/2ML IJ SOLN
4.0000 mg | Freq: Once | INTRAMUSCULAR | Status: AC
  Administered 2023-06-25: 4 mg via INTRAVENOUS
  Filled 2023-06-24: qty 2

## 2023-06-24 NOTE — Progress Notes (Signed)
Cardiac Individual Treatment Plan  Patient Details  Name: GERARD SUDDITH MRN: [id number] Date of Birth: 27-Jul-1933 Referring Provider:   Flowsheet Row Cardiac Rehab from 03/06/2023 in [facility] Cardiac and Pulmonary Rehab  Referring Provider Dr. Cammy Copa       Initial Encounter Date:  Flowsheet Row Cardiac Rehab from 03/06/2023 in [facility] Cardiac and Pulmonary Rehab  Date 03/06/23       Visit Diagnosis: Status post coronary artery stent placement  Patient's Home Medications on Admission:  Current Outpatient Medications:    acetaminophen (TYLENOL) 325 MG tablet, Take 650 mg by mouth every 6 (six) hours as needed. Reported on 01/17/2016, Disp: , Rfl:    apixaban (ELIQUIS) 5 MG TABS tablet, Take 5 mg by mouth 2 (two) times daily., Disp: , Rfl:    atorvastatin (LIPITOR) 40 MG tablet, Take 80 mg by mouth daily., Disp: , Rfl:    bisacodyl (DULCOLAX) 10 MG suppository, Place 1 suppository (10 mg total) rectally daily as needed for severe constipation., Disp: , Rfl:    bisacodyl (DULCOLAX) 5 MG EC tablet, Take 2 tablets (10 mg total) by mouth 2 (two) times daily., Disp: , Rfl:    clopidogrel (PLAVIX) 75 MG tablet, TAKE ONE TABLET BY MOUTH EVERY DAY FOR BLOOD CLOT PREVENTION FOLLOWING PCI BE SURE TO CHECK WITH YOUR DOCTOR ABOUT HOW LONG YOU SHOULD TAKE THIS MEDICINE, Disp: , Rfl:    finasteride (PROSCAR) 5 MG tablet, Take 1 tablet (5 mg total) by mouth daily., Disp: 90 tablet, Rfl: 3   folic acid (FOLVITE) 1 MG tablet, Take 1 tablet (1 mg total) by mouth daily., Disp: , Rfl:    iron polysaccharides (NIFEREX) 150 MG capsule, Take 1 capsule (150 mg total) by mouth daily., Disp: 90 capsule, Rfl: 0   pantoprazole (PROTONIX) 40 MG tablet, Take 1 tablet (40 mg total) by mouth daily., Disp: , Rfl:    polyethylene glycol (MIRALAX / GLYCOLAX) 17 g packet, Take 17 g by mouth 2 (two) times daily. Skip the dose if no constipation, Disp: , Rfl:    tamsulosin (FLOMAX) 0.4 MG CAPS capsule, TAKE ONE CAPSULE BY  MOUTH EVERY DAY (Patient taking differently: Take 0.4 mg by mouth at bedtime.), Disp: 30 capsule, Rfl: 11   zinc sulfate 220 (50 Zn) MG capsule, Take 1 capsule (220 mg total) by mouth daily., Disp: , Rfl:   Past Medical History: Past Medical History:  Diagnosis Date   Acute diverticulitis 09/2014   [facility]   Allergic rhinitis    BPH (benign prostatic hyperplasia)    CAD (coronary artery disease)    a. 10/2013 MV: low risk; b. 03/2020 Cath: mild-mod nonobs dzs; c. 08/2022 MV: tiny, mildly severe apical lateral ischemia; d. 01/2023 Cath/PCI: LCX 11m (2.25x38 Onyx Frontier DES - dil up to 3mm), OM1 70 (2x15 Onyx Frontier DES); e. 01/2023 relook Cath: LM/LAD no signif dzs, D2 40, LCX/OM1 patent stents, RCA 10p/m/d-->Med Rx.   Cataracts, bilateral    Chronic constipation 01/10/2015   Diastolic dysfunction    a. 09/2009 Echo: Ef 55%, no rwma, GrII DD, mild MR, mildly dil LA, nl RV fxn. PASP 25-25mmHg; b. 06/2022 Echo: EF 60%, GrII DD, nl RV fxn, mild AI.   HOH (hard of hearing)    Hyperlipemia    Hypertension    Osteoarthritis    PAF (paroxysmal atrial fibrillation) (HCC) 09/2009   a. CHA2DS2VASc = 4-->eliquis; b. Prev on flec->d/c 2021 after cath showed nonobs CAD; c. 07/2020 Recurrent AF->Amio-->[facility]). Amio d/c'd  early 2022.   Presence of permanent cardiac pacemaker    Sick sinus syndrome (HCC)    a. 10/2009 s/p MDT Adapta ADDRL1 DC PPM; b. 04/2023 s/p Gen change   Sigmoid volvulus (HCC)     Tobacco Use: Social History   Tobacco Use  Smoking Status Former   Passive exposure: Past  Smokeless Tobacco Never  Tobacco Comments   In [employer], quit in 1960's    Labs: Review Flowsheet  More data exists      Latest Ref Rng & Units 01/24/2011 04/29/2015 12/03/2016 10/13/2021 06/03/2023  Labs for ITP Cardiac and Pulmonary Rehab  Cholestrol 0 - 200 mg/dL 161  096  045  - -  LDL (calc) 0 - 99 mg/dL - 409  811  - -  Direct LDL mg/dL 914.7  - - - -  HDL-C >82.95 mg/dL 62.13  08.65  78.46  - -   Trlycerides 0.0 - 149.0 mg/dL 96.2  95.2  84.1  - -  Hemoglobin A1c 4.8 - 5.6 % - - - 5.5  5.1     Details             Exercise Target Goals: Exercise Program Goal: Individual exercise prescription set using results from initial 6 min walk test and THRR while considering  patient's activity barriers and safety.   Exercise Prescription Goal: Initial exercise prescription builds to 30-45 minutes a day of aerobic activity, 2-3 days per week.  Home exercise guidelines will be given to patient during program as part of exercise prescription that the participant will acknowledge.   Education: Aerobic Exercise: - Group verbal and visual presentation on the components of exercise prescription. Introduces F.I.T.T principle from ACSM for exercise prescriptions.  Reviews F.I.T.T. principles of aerobic exercise including progression. Written material given at graduation. Flowsheet Row Cardiac Rehab from 03/27/2023 in [facility] Cardiac and Pulmonary Rehab  Education need identified 03/06/23  Date 03/13/23  Educator [facility]  Instruction Review Code 1- Verbalizes Understanding       Education: Resistance Exercise: - Group verbal and visual presentation on the components of exercise prescription. Introduces F.I.T.T principle from ACSM for exercise prescriptions  Reviews F.I.T.T. principles of resistance exercise including progression. Written material given at graduation. Flowsheet Row Cardiac Rehab from 03/27/2023 in [facility] Cardiac and Pulmonary Rehab  Date 03/27/23  Educator NT  Instruction Review Code 1- Verbalizes Understanding        Education: Exercise & Equipment Safety: - Individual verbal instruction and demonstration of equipment use and safety with use of the equipment. Flowsheet Row Cardiac Rehab from 03/27/2023 in [facility] Cardiac and Pulmonary Rehab  Date 03/06/23  Educator [facility]  Instruction Review Code 1- Verbalizes Understanding       Education: Exercise Physiology & General Exercise  Guidelines: - Group verbal and written instruction with models to review the exercise physiology of the cardiovascular system and associated critical values. Provides general exercise guidelines with specific guidelines to those with heart or lung disease.  Flowsheet Row Cardiac Rehab from 03/27/2023 in [facility] Cardiac and Pulmonary Rehab  Education need identified 03/06/23       Education: Flexibility, Balance, Mind/Body Relaxation: - Group verbal and visual presentation with interactive activity on the components of exercise prescription. Introduces F.I.T.T principle from ACSM for exercise prescriptions. Reviews F.I.T.T. principles of flexibility and balance exercise training including progression. Also discusses the mind body connection.  Reviews various relaxation techniques to help reduce and manage stress (i.e. Deep breathing, progressive muscle relaxation, and visualization). Balance handout provided  to take home. Written material given at graduation. Flowsheet Row Cardiac Rehab from 03/27/2023 in [facility] Cardiac and Pulmonary Rehab  Date 03/27/23  Educator NT  Instruction Review Code 1- Verbalizes Understanding       Activity Barriers & Risk Stratification:  Activity Barriers & Cardiac Risk Stratification - 03/06/23 1336       Activity Barriers & Cardiac Risk Stratification   Activity Barriers Joint Problems;Muscular Weakness    Cardiac Risk Stratification Moderate             6 Minute Walk:  6 Minute Walk     Row Name 03/06/23 1332         6 Minute Walk   Phase Initial     Distance 155 feet     Walk Time 1.42 minutes     # of Rest Breaks 1     MPH 0.29     METS 1.97     RPE 13     Perceived Dyspnea  1     VO2 Peak 6.9     Symptoms Yes (comment)  weak legs-stopped test after 1:25     Comments weak legs-stopped test at 1:25     Resting HR 65 bpm     Resting BP 130/62     Resting Oxygen Saturation  98 %     Exercise Oxygen Saturation  during 6 min walk 99 %      Max Ex. HR 81 bpm     Max Ex. BP 138/70     2 Minute Post BP 128/64              Oxygen Initial Assessment:   Oxygen Re-Evaluation:   Oxygen Discharge (Final Oxygen Re-Evaluation):   Initial Exercise Prescription:  Initial Exercise Prescription - 03/06/23 1300       Date of Initial Exercise RX and Referring Provider   Date 03/06/23    Referring Provider Dr. Cammy Copa      Oxygen   Maintain Oxygen Saturation 88% or higher      Treadmill   MPH 0.5    Grade 0    Minutes 15   eventual goal of 15 min. Start with intervals, resting as often as needed.   METs 1.4      Recumbant Bike   Level 1    RPM 50    Watts 15    Minutes 15      NuStep   Level 1    SPM 80    Minutes 15    METs 1.97      Track   Laps 5    Minutes 15    METs 1.27      Prescription Details   Frequency (times per week) 3    Duration Progress to 30 minutes of continuous aerobic without signs/symptoms of physical distress      Intensity   THRR 40-80% of Max Heartrate 91-118    Ratings of Perceived Exertion 11-13    Perceived Dyspnea 0-4      Progression   Progression Continue to progress workloads to maintain intensity without signs/symptoms of physical distress.      Resistance Training   Training Prescription Yes    Weight 4    Reps 10-15             Perform Capillary Blood Glucose checks as needed.  Exercise Prescription Changes:   Exercise Prescription Changes     Row Name 03/06/23 1300 03/11/23 1500 03/27/23 1400 04/10/23 0800  04/25/23 1000     Response to Exercise   Blood Pressure (Admit) 130/62 126/64 122/66 122/70 104/60   Blood Pressure (Exercise) 138/70 130/60 128/66 118/60 144/72   Blood Pressure (Exit) 128/64 122/60 124/62 114/60 108/58   Heart Rate (Admit) 65 bpm 68 bpm 72 bpm 69 bpm 76 bpm   Heart Rate (Exercise) 81 bpm 67 bpm 74 bpm 96 bpm 92 bpm   Heart Rate (Exit) 65 bpm 57 bpm 65 bpm 71 bpm 62 bpm   Oxygen Saturation (Admit) 98 % -- -- -- --    Oxygen Saturation (Exercise) 99 % -- -- -- --   Oxygen Saturation (Exit) 99 % -- -- -- --   Rating of Perceived Exertion (Exercise) 13 16 15 15 15    Perceived Dyspnea (Exercise) 1 -- -- -- --   Symptoms weak legs- stopped test at 1:25 leg pain leg pain leg pain leg pain   Comments 6 MWT results First full day of exercise -- -- --   Duration -- Progress to 30 minutes of  aerobic without signs/symptoms of physical distress Progress to 30 minutes of  aerobic without signs/symptoms of physical distress Progress to 30 minutes of  aerobic without signs/symptoms of physical distress Progress to 30 minutes of  aerobic without signs/symptoms of physical distress   Intensity -- THRR unchanged THRR unchanged THRR unchanged THRR unchanged     Progression   Progression -- Continue to progress workloads to maintain intensity without signs/symptoms of physical distress. Continue to progress workloads to maintain intensity without signs/symptoms of physical distress. Continue to progress workloads to maintain intensity without signs/symptoms of physical distress. Continue to progress workloads to maintain intensity without signs/symptoms of physical distress.   Average METs -- 1.8 2.26 1.92 2.25     Resistance Training   Training Prescription -- Yes Yes Yes Yes   Weight -- 4 4 lb 4 lb 3 lb   Reps -- 10-15 10-15 10-15 10-15     Interval Training   Interval Training -- No No No No     NuStep   Level -- 1 1 1 1    Minutes -- 30 30 30 30    METs -- 1.8 3.7 2.1 2.6     T5 Nustep   Level -- -- 1 1 1    Minutes -- -- 30 30 15    METs -- -- 2 2 1.9     Oxygen   Maintain Oxygen Saturation -- 88% or higher 88% or higher 88% or higher 88% or higher            Exercise Comments:   Exercise Comments     Row Name 03/08/23 1026           Exercise Comments First full day of exercise!  Patient was oriented to gym and equipment including functions, settings, policies, and procedures.  Patient's individual  exercise prescription and treatment plan were reviewed.  All starting workloads were established based on the results of the 6 minute walk test done at initial orientation visit.  The plan for exercise progression was also introduced and progression will be customized based on patient's performance and goals.                Exercise Goals and Review:   Exercise Goals     Row Name 03/06/23 1357             Exercise Goals   Increase Physical Activity Yes       Intervention Provide advice,  education, support and counseling about physical activity/exercise needs.;Develop an individualized exercise prescription for aerobic and resistive training based on initial evaluation findings, risk stratification, comorbidities and participant's personal goals.       Expected Outcomes Short Term: Attend rehab on a regular basis to increase amount of physical activity.;Long Term: Add in home exercise to make exercise part of routine and to increase amount of physical activity.;Long Term: Exercising regularly at least 3-5 days a week.       Increase Strength and Stamina Yes       Intervention Provide advice, education, support and counseling about physical activity/exercise needs.;Develop an individualized exercise prescription for aerobic and resistive training based on initial evaluation findings, risk stratification, comorbidities and participant's personal goals.       Expected Outcomes Short Term: Increase workloads from initial exercise prescription for resistance, speed, and METs.;Short Term: Perform resistance training exercises routinely during rehab and add in resistance training at home;Long Term: Improve cardiorespiratory fitness, muscular endurance and strength as measured by increased METs and functional capacity ( )       Able to understand and use rate of perceived exertion (RPE) scale Yes       Intervention Provide education and explanation on how to use RPE scale       Expected Outcomes  Short Term: Able to use RPE daily in rehab to express subjective intensity level;Long Term:  Able to use RPE to guide intensity level when exercising independently       Able to understand and use Dyspnea scale Yes       Intervention Provide education and explanation on how to use Dyspnea scale       Expected Outcomes Short Term: Able to use Dyspnea scale daily in rehab to express subjective sense of shortness of breath during exertion;Long Term: Able to use Dyspnea scale to guide intensity level when exercising independently       Knowledge and understanding of Target Heart Rate Range (THRR) Yes       Intervention Provide education and explanation of THRR including how the numbers were predicted and where they are located for reference       Expected Outcomes Short Term: Able to state/look up THRR;Long Term: Able to use THRR to govern intensity when exercising independently;Short Term: Able to use daily as guideline for intensity in rehab       Able to check pulse independently Yes       Intervention Provide education and demonstration on how to check pulse in carotid and radial arteries.;Review the importance of being able to check your own pulse for safety during independent exercise       Expected Outcomes Short Term: Able to explain why pulse checking is important during independent exercise;Long Term: Able to check pulse independently and accurately       Understanding of Exercise Prescription Yes       Intervention Provide education, explanation, and written materials on patient's individual exercise prescription       Expected Outcomes Short Term: Able to explain program exercise prescription;Long Term: Able to explain home exercise prescription to exercise independently                Exercise Goals Re-Evaluation :  Exercise Goals Re-Evaluation     Row Name 03/08/23 1026 03/11/23 1507 03/27/23 1437 04/10/23 0819 04/25/23 1010     Exercise Goal Re-Evaluation   Exercise Goals Review  Able to understand and use rate of perceived exertion (RPE) scale;Able to understand and  use Dyspnea scale;Knowledge and understanding of Target Heart Rate Range (THRR);Understanding of Exercise Prescription Increase Physical Activity;Increase Strength and Stamina;Understanding of Exercise Prescription Increase Physical Activity;Increase Strength and Stamina;Understanding of Exercise Prescription Increase Physical Activity;Increase Strength and Stamina;Understanding of Exercise Prescription Increase Physical Activity;Increase Strength and Stamina;Understanding of Exercise Prescription   Comments Reviewed RPE  and dyspnea scale, THR and program prescription with pt today.  Pt voiced understanding and was given a copy of goals to take home. [name] is off to a good start in the program. He did well on the T4 nustep at level 1 during his first session. He has not done any walking yet due to having pain and weakness in his legs. He did do well with 4 lb hand weights for resistance training as well. We will continue to monitor his progress in the program. [name] is doing well in rehab. He has been doing 30 minutes at level 1 on either the T4 nustep or the T5 nustep while in the program. He also has continued to do well with 4 lb hand weights for resistance training. He has not been doing any walking in rehab. We will continue to monitor his progress in the program. [name] is doing well in rehab. He has been doing 30 minutes on both the T4 nustep and T5 nustep. He has not started doing any walking in the program due to leg pain. We will continue to monitor his progress in the program. [name] has only attended two sessions since the last review. He continues to work at level 1 on the T4 and T5 nustep. He has not started walking as part of his exercise in the program due to leg pain. We will continue to monitor (his/her) progress in the program.   Expected Outcomes Short: Use RPE daily to regulate intensity. Long: Follow program  prescription in THR. Short: Continue to follow current exercise prescription. Long: Continue to improve strength and stamina. Short: Try level 2 on T4 nustep. Long: Continue to improve strength and stamina. Short: Try level 2 on T4 nustep. Long: Continue to improve strength and stamina. Short: Try level 2 on T4 nustep. Long: Continue exercise to improve strength and stamina.    Row Name 05/21/23 1437 06/04/23 1518 06/20/23 0821         Exercise Goal Re-Evaluation   Exercise Goals Review Increase Physical Activity;Increase Strength and Stamina;Understanding of Exercise Prescription Increase Physical Activity;Increase Strength and Stamina;Understanding of Exercise Prescription Increase Physical Activity;Increase Strength and Stamina;Understanding of Exercise Prescription     Comments [name] has not attended since last review, as he is on medical hold. We will continue to monitor his progress upon return to the program. [name] has not attended since last review, as he is on medical hold. We will continue to monitor his progress upon return to the program. [name] has not attended since last review, as he continues to be on medical hold. We will continue to monitor his progress upon return to the program.     Expected Outcomes Short: Return to the program. Long: Continue to exercise to improve strength and stamina. Short: Return to the program. Long: Continue to exercise to improve strength and stamina. Short: Return to the program when appropriate. Long: Continue to exercise to improve strength and stamina.              Discharge Exercise Prescription (Final Exercise Prescription Changes):  Exercise Prescription Changes - 04/25/23 1000       Response to Exercise   Blood  Pressure (Admit) 104/60    Blood Pressure (Exercise) 144/72    Blood Pressure (Exit) 108/58    Heart Rate (Admit) 76 bpm    Heart Rate (Exercise) 92 bpm    Heart Rate (Exit) 62 bpm    Rating of Perceived Exertion (Exercise) 15     Symptoms leg pain    Duration Progress to 30 minutes of  aerobic without signs/symptoms of physical distress    Intensity THRR unchanged      Progression   Progression Continue to progress workloads to maintain intensity without signs/symptoms of physical distress.    Average METs 2.25      Resistance Training   Training Prescription Yes    Weight 3 lb    Reps 10-15      Interval Training   Interval Training No      NuStep   Level 1    Minutes 30    METs 2.6      T5 Nustep   Level 1    Minutes 15    METs 1.9      Oxygen   Maintain Oxygen Saturation 88% or higher             Nutrition:  Target Goals: Understanding of nutrition guidelines, daily intake of sodium 1500mg , cholesterol 200mg , calories 30% from fat and 7% or less from saturated fats, daily to have 5 or more servings of fruits and vegetables.  Education: All About Nutrition: -Group instruction provided by verbal, written material, interactive activities, discussions, models, and posters to present general guidelines for heart healthy nutrition including fat, fiber, MyPlate, the role of sodium in heart healthy nutrition, utilization of the nutrition label, and utilization of this knowledge for meal planning. Follow up email sent as well. Written material given at graduation.   Biometrics:  Pre Biometrics - 03/06/23 1358       Pre Biometrics   Height 5' 9.5" (1.765 m)    Weight 146 lb 3.2 oz (66.3 kg)    Waist Circumference 34 inches    Hip Circumference 37.5 inches    Waist to Hip Ratio 0.91 %    BMI (Calculated) 21.29    Single Leg Stand 1.97 seconds              Nutrition Therapy Plan and Nutrition Goals:  Nutrition Therapy & Goals - 03/06/23 1400       Intervention Plan   Intervention Prescribe, educate and counsel regarding individualized specific dietary modifications aiming towards targeted core components such as weight, hypertension, lipid management, diabetes, heart failure and  other comorbidities.    Expected Outcomes Short Term Goal: Understand basic principles of dietary content, such as calories, fat, sodium, cholesterol and nutrients.;Short Term Goal: A plan has been developed with personal nutrition goals set during dietitian appointment.;Long Term Goal: Adherence to prescribed nutrition plan.             Nutrition Assessments:  MEDIFICTS Score Key: >=70 Need to make dietary changes  40-70 Heart Healthy Diet <= 40 Therapeutic Level Cholesterol Diet  Flowsheet Row Cardiac Rehab from 03/06/2023 in [facility] Cardiac and Pulmonary Rehab  Picture Your Plate Total Score on Admission 39      Picture Your Plate Scores: <40 Unhealthy dietary pattern with much room for improvement. 41-50 Dietary pattern unlikely to meet recommendations for good health and room for improvement. 51-60 More healthful dietary pattern, with some room for improvement.  >60 Healthy dietary pattern, although there may be some specific behaviors that could  be improved.    Nutrition Goals Re-Evaluation:   Nutrition Goals Discharge (Final Nutrition Goals Re-Evaluation):   Psychosocial: Target Goals: Acknowledge presence or absence of significant depression and/or stress, maximize coping skills, provide positive support system. Participant is able to verbalize types and ability to use techniques and skills needed for reducing stress and depression.   Education: Stress, Anxiety, and Depression - Group verbal and visual presentation to define topics covered.  Reviews how body is impacted by stress, anxiety, and depression.  Also discusses healthy ways to reduce stress and to treat/manage anxiety and depression.  Written material given at graduation.   Education: Sleep Hygiene -Provides group verbal and written instruction about how sleep can affect your health.  Define sleep hygiene, discuss sleep cycles and impact of sleep habits. Review good sleep hygiene tips.    Initial Review &  Psychosocial Screening:  Initial Psych Review & Screening - 02/28/23 1443       Initial Review   Current issues with Current Stress Concerns    Comments not feeling better after stents      Family Dynamics   Good Support System? Yes   wife     Barriers   Psychosocial barriers to participate in program There are no identifiable barriers or psychosocial needs.;The patient should benefit from training in stress management and relaxation.      Screening Interventions   Interventions Encouraged to exercise;Provide feedback about the scores to participant;To provide support and resources with identified psychosocial needs    Expected Outcomes Long Term Goal: Stressors or current issues are controlled or eliminated.;Short Term goal: Utilizing psychosocial counselor, staff and physician to assist with identification of specific Stressors or current issues interfering with healing process. Setting desired goal for each stressor or current issue identified.;Short Term goal: Identification and review with participant of any Quality of Life or Depression concerns found by scoring the questionnaire.;Long Term goal: The participant improves quality of Life and PHQ9 Scores as seen by post scores and/or verbalization of changes             Quality of Life Scores:   Quality of Life - 03/06/23 1358       Quality of Life   Select Quality of Life      Quality of Life Scores   Health/Function Pre 15.2 %    Socioeconomic Pre 28.93 %    Psych/Spiritual Pre 21.21 %    Family Pre 30 %    GLOBAL Pre 21.44 %            Scores of 19 and below usually indicate a poorer quality of life in these areas.  A difference of  2-3 points is a clinically meaningful difference.  A difference of 2-3 points in the total score of the Quality of Life Index has been associated with significant improvement in overall quality of life, self-image, physical symptoms, and general health in studies assessing change in  quality of life.  PHQ-9: Review Flowsheet  More data exists      03/06/2023 10/13/2021 08/26/2019 11/28/2016 04/29/2015  Depression screen PHQ 2/9  Decreased Interest 1 0 0 0 0  Down, Depressed, Hopeless 0 0 0 0 0  PHQ - 2 Score 1 0 0 0 0  Altered sleeping 0 - - - -  Tired, decreased energy 3 - - - -  Change in appetite 0 - - - -  Feeling bad or failure about yourself  1 - - - -  Trouble concentrating 0 - - - -  Moving slowly or fidgety/restless 0 - - - -  Suicidal thoughts 0 - - - -  PHQ-9 Score 5 - - - -  Difficult doing work/chores Somewhat difficult - - - -    Details           Interpretation of Total Score  Total Score Depression Severity:  1-4 = Minimal depression, 5-9 = Mild depression, 10-14 = Moderate depression, 15-19 = Moderately severe depression, 20-27 = Severe depression   Psychosocial Evaluation and Intervention:  Psychosocial Evaluation - 02/28/23 1446       Psychosocial Evaluation & Interventions   Interventions Encouraged to exercise with the program and follow exercise prescription;Stress management education    Comments Mr. Maxine is coming to the program after stent placement. He states that he unfortunately is not feeling as well as he thought he would be after the stent. He still feels tired. He is trying to be patient with his health, but it is stressful that he still doesn't have energy. He states he is eating and sleeping well.  He is hopeful this program will help boost his stamina. His wife is his main support system.    Expected Outcomes Short: attend cardiac rehab for education and exercise. Long: develop and maintain positive self care habits.    Continue Psychosocial Services  Follow up required by staff             Psychosocial Re-Evaluation:   Psychosocial Discharge (Final Psychosocial Re-Evaluation):   Vocational Rehabilitation: Provide vocational rehab assistance to qualifying candidates.   Vocational Rehab Evaluation &  Intervention:  Vocational Rehab - 02/28/23 1446       Initial Vocational Rehab Evaluation & Intervention   Assessment shows need for Vocational Rehabilitation No             Education: Education Goals: Education classes will be provided on a variety of topics geared toward better understanding of heart health and risk factor modification. Participant will state understanding/return demonstration of topics presented as noted by education test scores.  Learning Barriers/Preferences:  Learning Barriers/Preferences - 02/28/23 1446       Learning Barriers/Preferences   Learning Barriers Hearing    Learning Preferences Individual Instruction             General Cardiac Education Topics:  AED/CPR: - Group verbal and written instruction with the use of models to demonstrate the basic use of the AED with the basic ABC's of resuscitation.   Anatomy and Cardiac Procedures: - Group verbal and visual presentation and models provide information about basic cardiac anatomy and function. Reviews the testing methods done to diagnose heart disease and the outcomes of the test results. Describes the treatment choices: Medical Management, Angioplasty, or Coronary Bypass Surgery for treating various heart conditions including Myocardial Infarction, Angina, Valve Disease, and Cardiac Arrhythmias.  Written material given at graduation. Flowsheet Row Cardiac Rehab from 03/27/2023 in [facility]  Education need identified 03/06/23       Medication Safety: - Group verbal and visual instruction to review commonly prescribed medications for heart and lung disease. Reviews the medication, class of the drug, and side effects. Includes the steps to properly store meds and maintain the prescription regimen.  Written material given at graduation.   Intimacy: - Group verbal instruction through game format to discuss how heart and lung disease can affect sexual intimacy. Written  material given at graduation.. Flowsheet Row Cardiac Rehab from 03/27/2023 in [facility] Cardiac and Pulmonary Rehab  Date  03/13/23  Educator J[facility]  Instruction Review Code 1- Verbalizes Understanding       Know Your Numbers and Heart Failure: - Group verbal and visual instruction to discuss disease risk factors for cardiac and pulmonary disease and treatment options.  Reviews associated critical values for Overweight/Obesity, Hypertension, Cholesterol, and Diabetes.  Discusses basics of heart failure: signs/symptoms and treatments.  Introduces Heart Failure Zone chart for action plan for heart failure.  Written material given at graduation.   Infection Prevention: - Provides verbal and written material to individual with discussion of infection control including proper hand washing and proper equipment cleaning during exercise session. Flowsheet Row Cardiac Rehab from 03/27/2023 in [facility] Cardiac and Pulmonary Rehab  Date 03/06/23  Educator [facility]  Instruction Review Code 1- Verbalizes Understanding       Falls Prevention: - Provides verbal and written material to individual with discussion of falls prevention and safety. Flowsheet Row Cardiac Rehab from 03/27/2023 in [facility] Cardiac and Pulmonary Rehab  Date 03/06/23  Educator [facility]  Instruction Review Code 1- Verbalizes Understanding       Other: -Provides group and verbal instruction on various topics (see comments)   Knowledge Questionnaire Score:  Knowledge Questionnaire Score - 03/06/23 1358       Knowledge Questionnaire Score   Pre Score 22/26             Core Components/Risk Factors/Patient Goals at Admission:  Personal Goals and Risk Factors at Admission - 03/06/23 1400       Core Components/Risk Factors/Patient Goals on Admission    Weight Management Yes    Intervention Weight Management: Develop a combined nutrition and exercise program designed to reach desired caloric intake, while maintaining appropriate intake of  nutrient and fiber, sodium and fats, and appropriate energy expenditure required for the weight goal.;Weight Management: Provide education and appropriate resources to help participant work on and attain dietary goals.    Admit Weight 146 lb 3.2 oz (66.3 kg)    Goal Weight: Short Term 150 lb (68 kg)    Goal Weight: Long Term 155 lb (70.3 kg)    Expected Outcomes Short Term: Continue to assess and modify interventions until short term weight is achieved;Long Term: Adherence to nutrition and physical activity/exercise program aimed toward attainment of established weight goal;Weight Gain: Understanding of general recommendations for a high calorie, high protein meal plan that promotes weight gain by distributing calorie intake throughout the day with the consumption for 4-5 meals, snacks, and/or supplements;Understanding of distribution of calorie intake throughout the day with the consumption of 4-5 meals/snacks;Understanding recommendations for meals to include 15-35% energy as protein, 25-35% energy from fat, 35-60% energy from carbohydrates, less than 200mg  of dietary cholesterol, 20-35 gm of total fiber daily    Hypertension Yes    Intervention Provide education on lifestyle modifcations including regular physical activity/exercise, weight management, moderate sodium restriction and increased consumption of fresh fruit, vegetables, and low fat dairy, alcohol moderation, and smoking cessation.;Monitor prescription use compliance.    Expected Outcomes Short Term: Continued assessment and intervention until BP is < 140/27mm HG in hypertensive participants. < 130/28mm HG in hypertensive participants with diabetes, heart failure or chronic kidney disease.;Long Term: Maintenance of blood pressure at goal levels.    Lipids Yes    Intervention Provide education and support for participant on nutrition & aerobic/resistive exercise along with prescribed medications to achieve LDL 70mg , HDL >40mg .    Expected  Outcomes Short Term: Participant states understanding of desired cholesterol values and is  compliant with medications prescribed. Participant is following exercise prescription and nutrition guidelines.;Long Term: Cholesterol controlled with medications as prescribed, with individualized exercise RX and with personalized nutrition plan. Value goals: LDL < 70mg , HDL > 40 mg.             Education:Diabetes - Individual verbal and written instruction to review signs/symptoms of diabetes, desired ranges of glucose level fasting, after meals and with exercise. Acknowledge that pre and post exercise glucose checks will be done for 3 sessions at entry of program.   Core Components/Risk Factors/Patient Goals Review:    Core Components/Risk Factors/Patient Goals at Discharge (Final Review):    ITP Comments:  ITP Comments     Row Name 02/28/23 1433 03/06/23 1331 03/08/23 1026 03/20/23 0836 04/16/23 1413   ITP Comments Initial phone call completed. Diagnosis can be found [facility] in media tab. EP Orientation scheduled for Wednesday 5/29 10:30. Completed and gym orientation. Initial ITP created and sent for review to Dr. Bethann Punches, Medical Director. First full day of exercise!  Patient was oriented to gym and equipment including functions, settings, policies, and procedures.  Patient's individual exercise prescription and treatment plan were reviewed.  All starting workloads were established based on the results of the 6 minute walk test done at initial orientation visit.  The plan for exercise progression was also introduced and progression will be customized based on patient's performance and goals. 30 Day review completed. Medical Director ITP review done, changes made as directed, and signed approval by Medical Director.   new to program 30 Day review completed. Medical Director ITP review done, changes made as directed, and signed approval by Medical Director.    last visit 6/24     Row Name 05/06/23 1522 05/08/23 0946 05/15/23 1131 06/03/23 1659 06/12/23 0947   ITP Comments Called and spoke with [name], he was not at rehab today. He has continued decline in strength and states he can hardly walk. [facility]. [name] says he is unable to complete rehab program at this time until they can figure out why he is losing his strength. Will discharge from the program at this time, and [name] understands he may restart in the future with a new referral if able. [name] Elease Hashimoto called today with [name] and requested to be placed on a medical hold. Pt will be attending a long term rehab facility for 30 days but would like to return to our program upon completion of his time there. We infromed Pt and Pt's daughter that he will need to keep [place] updated on when he plans to return to cardiac rehab. We will place Pt on medical hold at this time. 30 Day review completed. Medical Director ITP review done, changes made as directed, and signed approval by Medical Director.   medical hold Pt is currently admitted since 8/24. Will follow up with pt/daughter after discharge to discuss plan. 30 Day review completed. Medical Director ITP review done, changes made as directed, and signed approval by Medical Director.    Row Name 06/24/23 1711           ITP Comments Called and spoke with pt's daughter, Elease Hashimoto. She said they just left [facility], pt was discharged from LTC/Rehab facility after continued decline they will be calling Hospice for the next plan. Elease Hashimoto appreciative of call to follow up and voiced understanding that we will discharge from rehab program at this time.  Comments: Discharge ITP

## 2023-06-24 NOTE — Progress Notes (Signed)
Discharge Summary:  Glendel Dondiego (DOB: 03/17/1933)  Schyler was discharged early from cardiac rehab due to health decline. Spoke with pt's daughter who said pt was just discharged from [facility] and is planning to start Hospice care. Romir completed 12/36 sessions.    6 Minute Walk     Row Name 03/06/23 1332         6 Minute Walk   Phase Initial     Distance 155 feet     Walk Time 1.42 minutes     # of Rest Breaks 1     MPH 0.29     METS 1.97     RPE 13     Perceived Dyspnea  1     VO2 Peak 6.9     Symptoms Yes (comment)  weak legs-stopped test after 1:25     Comments weak legs-stopped test at 1:25     Resting HR 65 bpm     Resting BP 130/62     Resting Oxygen Saturation  98 %     Exercise Oxygen Saturation  during 6 min walk 99 %     Max Ex. HR 81 bpm     Max Ex. BP 138/70     2 Minute Post BP 128/64

## 2023-06-24 NOTE — ED Triage Notes (Signed)
Pt tells me that he is losing weight and generally weak and that he thinks he is malnourished.  Denies any pain or other symptoms.

## 2023-06-24 NOTE — ED Provider Notes (Signed)
11:45 PM  Assumed care at shift change.  Patient here from [facility] for concerns for significant weight loss since recent admission there, hypotension, decreased oral intake.  Family does not want patient to go back to [facility].  They are requesting that hospice be involved.  Labs thus far unremarkable.  Urine pending.  2:50 AM  Pt's urine appears grossly infected.  Will give Rocephin.  He is again having slightly low blood pressures but I suspect this is more from dehydration, failure to thrive, malnutrition rather than sepsis.  He has no fever, leukocytosis, tachycardia.  Will give Rocephin, additional IV fluids and discussed with hospitalist for admission.   Treva Huyett, Layla Maw, DO 06/25/23 [phone]

## 2023-06-24 NOTE — Telephone Encounter (Signed)
Called and spoke with pt's daughter, Elease Hashimoto. She said they just left [facility], pt was discharged from LTC/Rehab facility after continued decline they will be calling Hospice for the next plan. Elease Hashimoto appreciative of call to follow up and voiced understanding that we will discharge from rehab program at this time.

## 2023-06-24 NOTE — ED Provider Notes (Signed)
[facility] Provider Note    Event Date/Time   First MD Initiated Contact with Patient 06/24/23 2142     (approximate)   History   Weakness   HPI  [name] is a 89 year old male with history of A-fib on Eliquis, hypertension, CAD presenting to the emergency department for evaluation of weakness.  History primarily obtained from patient's grandson over the phone.  He reports that he was recently admitted to our hospital and discharged to [facility].  Since there, patient has not been eating and they are concerned about patient's weight loss.  He reports that he has been in contact with hospice to see if patient is eligible, we will was told that patient needed a social worker to help coordinate this.  Patient denies fevers, chills, chest pain, shortness of breath, vomiting, diarrhea, abdominal pain.  Grandson reports patient was recently treated for a UTI.     Physical Exam   Triage Vital Signs: ED Triage Vitals  Encounter Vitals Group     BP 06/24/23 1749 (!) 90/54     Systolic BP Percentile --      Diastolic BP Percentile --      Pulse Rate 06/24/23 1749 61     Resp 06/24/23 1749 14     Temp 06/24/23 1749 98 F (36.7 C)     Temp Source 06/24/23 1749 Oral     SpO2 06/24/23 1749 98 %     Weight --      Height --      Head Circumference --      Peak Flow --      Pain Score 06/24/23 1740 0     Pain Loc --      Pain Education --      Exclude from Growth Chart --     Most recent vital signs: Vitals:   06/24/23 1749  BP: (!) 90/54  Pulse: 61  Resp: 14  Temp: 98 F (36.7 C)  SpO2: 98%     General: Awake, interactive, frail CV:  Regular rate, good peripheral perfusion.  Resp:  Lungs clear, unlabored respirations.  Abd:  Soft, nondistended, no appreciable tenderness to palpation Neuro:  Symmetric mild weakness of the upper extremities, unable to hold bilateral lower extremities antigravity, patient and grandson report that this is  not new, fluid speech  ED Results / Procedures / Treatments   Labs (all labs ordered are listed, but only abnormal results are displayed) Labs Reviewed  BASIC METABOLIC PANEL - Abnormal; Notable for the following components:      Result Value   Glucose, Bld 109 (*)    BUN 26 (*)    Creatinine, Ser 0.57 (*)    Calcium 8.6 (*)    All other components within normal limits  CBC  URINALYSIS, ROUTINE W REFLEX MICROSCOPIC  HEPATIC FUNCTION PANEL  LIPASE, BLOOD  CBG MONITORING, ED     EKG EKG independently reviewed interpreted by myself (ER attending) demonstrates:  EKG demonstrates atrially paced rhythm at a rate of 60, PR 260, QRS 140, QTc 458, no superimposed appreciable ischemia  RADIOLOGY Imaging independently reviewed and interpreted by myself demonstrates:    PROCEDURES:  Critical Care performed: No  Procedures   MEDICATIONS ORDERED IN ED: Medications  sodium chloride 0.9 % bolus 1,000 mL (has no administration in time range)  ondansetron (ZOFRAN) injection 4 mg (has no administration in time range)     IMPRESSION / MDM / ASSESSMENT AND PLAN /  ED COURSE  I reviewed the triage vital signs and the nursing notes.  Differential diagnosis includes, but is not limited to, anemia, electrolyte abnormality, lower suspicion significant acute intra-abdominal process given reassuring abdominal exam, but consideration for biliary pathology, pancreatitis  Patient's presentation is most consistent with acute presentation with potential threat to life or bodily function.  89 year old male presenting to the emergency department with generalized weakness.  Labs sent from triage without critical derangements.  Patient reportedly not tolerating p.o. intake in his facility.  Does appear frail on exam here.  Will provide IV fluids, Zofran, add on LFTs and lipase.  Urinalysis pending.  Signed out to oncoming provider pending completion of workup and disposition.    FINAL CLINICAL  IMPRESSION(S) / ED DIAGNOSES   Final diagnoses:  Generalized weakness     Rx / DC Orders   ED Discharge Orders     None        Note:  This document was prepared using Dragon voice recognition software and may include unintentional dictation errors.   Trinna Post, MD 06/24/23 [phone]

## 2023-06-24 NOTE — ED Notes (Signed)
Delay in care to due critical care patient

## 2023-06-24 NOTE — ED Triage Notes (Signed)
First Nurse Note: Patient to ED via [facility] form [facility] for increased weakness. Patient reports weight loss due to not eating drinking as much as normal. VS WNL

## 2023-06-25 DIAGNOSIS — I2510 Atherosclerotic heart disease of native coronary artery without angina pectoris: Secondary | ICD-10-CM | POA: Insufficient documentation

## 2023-06-25 DIAGNOSIS — I10 Essential (primary) hypertension: Secondary | ICD-10-CM | POA: Diagnosis present

## 2023-06-25 DIAGNOSIS — R410 Disorientation, unspecified: Secondary | ICD-10-CM | POA: Diagnosis not present

## 2023-06-25 DIAGNOSIS — N390 Urinary tract infection, site not specified: Secondary | ICD-10-CM | POA: Diagnosis present

## 2023-06-25 DIAGNOSIS — L89612 Pressure ulcer of right heel, stage 2: Secondary | ICD-10-CM | POA: Diagnosis present

## 2023-06-25 DIAGNOSIS — Z681 Body mass index (BMI) 19 or less, adult: Secondary | ICD-10-CM | POA: Diagnosis not present

## 2023-06-25 DIAGNOSIS — Z8249 Family history of ischemic heart disease and other diseases of the circulatory system: Secondary | ICD-10-CM | POA: Diagnosis not present

## 2023-06-25 DIAGNOSIS — Z961 Presence of intraocular lens: Secondary | ICD-10-CM | POA: Diagnosis present

## 2023-06-25 DIAGNOSIS — Z950 Presence of cardiac pacemaker: Secondary | ICD-10-CM | POA: Diagnosis not present

## 2023-06-25 DIAGNOSIS — I480 Paroxysmal atrial fibrillation: Secondary | ICD-10-CM | POA: Diagnosis present

## 2023-06-25 DIAGNOSIS — R627 Adult failure to thrive: Secondary | ICD-10-CM | POA: Diagnosis present

## 2023-06-25 DIAGNOSIS — R531 Weakness: Secondary | ICD-10-CM | POA: Diagnosis not present

## 2023-06-25 DIAGNOSIS — R54 Age-related physical debility: Secondary | ICD-10-CM | POA: Diagnosis present

## 2023-06-25 DIAGNOSIS — J309 Allergic rhinitis, unspecified: Secondary | ICD-10-CM | POA: Diagnosis present

## 2023-06-25 DIAGNOSIS — I495 Sick sinus syndrome: Secondary | ICD-10-CM | POA: Diagnosis present

## 2023-06-25 DIAGNOSIS — Z87891 Personal history of nicotine dependence: Secondary | ICD-10-CM | POA: Diagnosis not present

## 2023-06-25 DIAGNOSIS — E785 Hyperlipidemia, unspecified: Secondary | ICD-10-CM | POA: Diagnosis present

## 2023-06-25 DIAGNOSIS — E876 Hypokalemia: Secondary | ICD-10-CM | POA: Diagnosis present

## 2023-06-25 DIAGNOSIS — H9190 Unspecified hearing loss, unspecified ear: Secondary | ICD-10-CM | POA: Diagnosis present

## 2023-06-25 DIAGNOSIS — Z515 Encounter for palliative care: Secondary | ICD-10-CM | POA: Diagnosis not present

## 2023-06-25 DIAGNOSIS — L89622 Pressure ulcer of left heel, stage 2: Secondary | ICD-10-CM | POA: Diagnosis present

## 2023-06-25 DIAGNOSIS — Z7401 Bed confinement status: Secondary | ICD-10-CM | POA: Diagnosis not present

## 2023-06-25 DIAGNOSIS — R634 Abnormal weight loss: Secondary | ICD-10-CM | POA: Diagnosis not present

## 2023-06-25 DIAGNOSIS — Z7901 Long term (current) use of anticoagulants: Secondary | ICD-10-CM | POA: Diagnosis not present

## 2023-06-25 DIAGNOSIS — I959 Hypotension, unspecified: Secondary | ICD-10-CM | POA: Diagnosis present

## 2023-06-25 DIAGNOSIS — L89152 Pressure ulcer of sacral region, stage 2: Secondary | ICD-10-CM | POA: Diagnosis present

## 2023-06-25 DIAGNOSIS — E43 Unspecified severe protein-calorie malnutrition: Secondary | ICD-10-CM | POA: Diagnosis present

## 2023-06-25 DIAGNOSIS — N400 Enlarged prostate without lower urinary tract symptoms: Secondary | ICD-10-CM | POA: Diagnosis present

## 2023-06-25 DIAGNOSIS — Z66 Do not resuscitate: Secondary | ICD-10-CM | POA: Diagnosis present

## 2023-06-25 LAB — BASIC METABOLIC PANEL
Anion gap: 9 (ref 5–15)
BUN: 21 mg/dL (ref 8–23)
CO2: 27 mmol/L (ref 22–32)
Calcium: 8.5 mg/dL — ABNORMAL LOW (ref 8.9–10.3)
Chloride: 105 mmol/L (ref 98–111)
Creatinine, Ser: 0.5 mg/dL — ABNORMAL LOW (ref 0.61–1.24)
GFR, Estimated: >60 mL/min (ref >60)
Glucose, Bld: 104 mg/dL — ABNORMAL HIGH (ref 70–99)
Potassium: 3.3 mmol/L — ABNORMAL LOW (ref 3.5–5.1)
Sodium: 141 mmol/L (ref 135–145)

## 2023-06-25 LAB — URINALYSIS, ROUTINE W REFLEX MICROSCOPIC
Bilirubin Urine: NEGATIVE
Glucose, UA: NEGATIVE mg/dL
Ketones, ur: NEGATIVE mg/dL
Nitrite: NEGATIVE
Protein, ur: 30 mg/dL — AB
RBC / HPF: >50 RBC/hpf (ref 0–5)
Specific Gravity, Urine: 1.02 (ref 1.005–1.030)
WBC, UA: >50 WBC/hpf (ref 0–5)
pH: 5 (ref 5.0–8.0)

## 2023-06-25 LAB — CBC
HCT: 39.8 % (ref 39.0–52.0)
Hemoglobin: 13.5 g/dL (ref 13.0–17.0)
MCH: 31.8 pg (ref 26.0–34.0)
MCHC: 33.9 g/dL (ref 30.0–36.0)
MCV: 93.6 fL (ref 80.0–100.0)
Platelets: 131 10*3/uL — ABNORMAL LOW (ref 150–400)
RBC: 4.25 MIL/uL (ref 4.22–5.81)
RDW: 14.8 % (ref 11.5–15.5)
WBC: 5.6 10*3/uL (ref 4.0–10.5)
nRBC: 0 % (ref 0.0–0.2)

## 2023-06-25 LAB — GLUCOSE, CAPILLARY: Glucose-Capillary: 88 mg/dL (ref 70–99)

## 2023-06-25 MED ORDER — ZINC SULFATE 220 (50 ZN) MG PO CAPS
220.0000 mg | ORAL_CAPSULE | Freq: Every day | ORAL | Status: DC
  Administered 2023-06-25: 220 mg via ORAL
  Filled 2023-06-25: qty 1

## 2023-06-25 MED ORDER — ACETAMINOPHEN 325 MG PO TABS
650.0000 mg | ORAL_TABLET | Freq: Four times a day (QID) | ORAL | Status: DC | PRN
  Administered 2023-06-27 – 2023-06-30 (×5): 650 mg via ORAL
  Filled 2023-06-25 (×5): qty 2

## 2023-06-25 MED ORDER — ATORVASTATIN CALCIUM 20 MG PO TABS
80.0000 mg | ORAL_TABLET | Freq: Every day | ORAL | Status: DC
  Administered 2023-06-25: 80 mg via ORAL
  Filled 2023-06-25: qty 4

## 2023-06-25 MED ORDER — APIXABAN 5 MG PO TABS
5.0000 mg | ORAL_TABLET | Freq: Two times a day (BID) | ORAL | Status: DC
  Administered 2023-06-25 – 2023-06-26 (×3): 5 mg via ORAL
  Filled 2023-06-25 (×3): qty 1

## 2023-06-25 MED ORDER — SODIUM CHLORIDE 0.9 % IV SOLN
2.0000 g | Freq: Once | INTRAVENOUS | Status: AC
  Administered 2023-06-25: 2 g via INTRAVENOUS
  Filled 2023-06-25: qty 20

## 2023-06-25 MED ORDER — ENSURE ENLIVE PO LIQD
237.0000 mL | Freq: Two times a day (BID) | ORAL | Status: DC
  Administered 2023-06-26: 237 mL via ORAL

## 2023-06-25 MED ORDER — ONDANSETRON HCL 4 MG PO TABS: 4.0000 mg | ORAL_TABLET | Freq: Four times a day (QID) | ORAL | Status: DC | PRN

## 2023-06-25 MED ORDER — SODIUM CHLORIDE 0.9 % IV SOLN: INTRAVENOUS | Status: AC

## 2023-06-25 MED ORDER — ACETAMINOPHEN 650 MG RE SUPP: 650.0000 mg | Freq: Four times a day (QID) | RECTAL | Status: DC | PRN

## 2023-06-25 MED ORDER — BISACODYL 10 MG RE SUPP: 10.0000 mg | Freq: Every day | RECTAL | Status: DC | PRN

## 2023-06-25 MED ORDER — ONDANSETRON HCL 4 MG/2ML IJ SOLN: 4.0000 mg | Freq: Four times a day (QID) | INTRAMUSCULAR | Status: DC | PRN

## 2023-06-25 MED ORDER — SODIUM CHLORIDE 0.9 % IV SOLN: INTRAVENOUS | Status: DC

## 2023-06-25 MED ORDER — SODIUM CHLORIDE 0.9 % IV SOLN
1.0000 g | INTRAVENOUS | Status: DC
  Administered 2023-06-25: 1 g via INTRAVENOUS
  Filled 2023-06-25: qty 10

## 2023-06-25 MED ORDER — POLYSACCHARIDE IRON COMPLEX 150 MG PO CAPS
150.0000 mg | ORAL_CAPSULE | Freq: Every day | ORAL | Status: DC
  Filled 2023-06-25: qty 1

## 2023-06-25 NOTE — H&P (Signed)
History and Physical    Patient: [name] ZOX:[id number] DOB: 06-18-1933 DOA: 06/24/2023 DOS: the patient was seen and examined on 06/25/2023 PCP: Karie Schwalbe, MD  Patient coming from: Home  Chief Complaint:  Chief Complaint  Patient presents with   Weakness    HPI: [name] is a 89 y.o. male with medical history significant for anxiety, depression, A-fib on Eliquis, hypertension, history of sick sinus syndrome status post pacemaker placement, CAD s/p DES to LCx and OM 01/2023 at the [place], recently hospitalized from 8/24 to 06/07/2023 with generalized weakness/unintentional weight loss of undetermined etiology, seen by neurology with plans for outpatient EMG/NCS, and discharged to SNF, was sent to the ED for evaluation of ongoing weakness and failure to thrive.  Since his arrival to SNF, patient had not been eating.  Patient had a recent pansensitive Morganella UTI on 06/18/2023 and was treated with antibiotics but he has had no overall improvement.  Per review of EDPs note spoke with grandson, hospice is being considered. History limited due to hearing impairment.  Grandson's phone went to voicemail  ED course and data review: On arrival BP 90/54, initially fluid responsive to 143/113 then dropped again to 96/55.  Vitals otherwise unremarkable Labs: CBC WNL BUN/creatinine 26/0.57.  Lipase and LFTs except for low protein and albumin mildly elevated ALT of 57. Urinalysis still with moderate leukocyte esterase, rare bacteria EKG, personally viewed and interpreted showing atrial paced rhythm at 60  Patient treated with an IV fluid bolus Zofran ceftriaxone. Hospitalist consulted for admission.     Past Medical History:  Diagnosis Date   Acute diverticulitis 09/2014   [facility]   Allergic rhinitis    BPH (benign prostatic hyperplasia)    CAD (coronary artery disease)    a. 10/2013 MV: low risk; b. 03/2020 Cath: mild-mod nonobs dzs; c. 08/2022 MV: tiny, mildly severe apical lateral  ischemia; d. 01/2023 Cath/PCI: LCX 43m (2.25x38 Onyx Frontier DES - dil up to 3mm), OM1 70 (2x15 Onyx Frontier DES); e. 01/2023 relook Cath: LM/LAD no signif dzs, D2 40, LCX/OM1 patent stents, RCA 10p/m/d-->Med Rx.   Cataracts, bilateral    Chronic constipation 01/10/2015   Diastolic dysfunction    a. 09/2009 Echo: Ef 55%, no rwma, GrII DD, mild MR, mildly dil LA, nl RV fxn. PASP 25-45mmHg; b. 06/2022 Echo: EF 60%, GrII DD, nl RV fxn, mild AI.   HOH (hard of hearing)    Hyperlipemia    Hypertension    Osteoarthritis    PAF (paroxysmal atrial fibrillation) (HCC) 09/2009   a. CHA2DS2VASc = 4-->eliquis; b. Prev on flec->d/c 2021 after cath showed nonobs CAD; c. 07/2020 Recurrent AF->Amio-->[facility]). Amio d/c'd early 2022.   Presence of permanent cardiac pacemaker    Sick sinus syndrome (HCC)    a. 10/2009 s/p MDT Adapta ADDRL1 DC PPM; b. 04/2023 s/p Gen change   Sigmoid volvulus [facility])    Past Surgical History:  Procedure Laterality Date   CATARACT EXTRACTION W/PHACO Left 01/16/2017   Procedure: CATARACT EXTRACTION PHACO AND INTRAOCULAR LENS PLACEMENT (IOC);  Surgeon: Sallee Lange, MD;  Location: [facility] ORS;  Service: Ophthalmology;  Laterality: Left;  [place] 1:51.3 AP% 25.9 CDE 54.80 FLUID PACK LOT # [id number] H   COLONOSCOPY WITH PROPOFOL N/A 09/15/2018   Procedure: COLONOSCOPY WITH PROPOFOL;  Surgeon: Toney Reil, MD;  Location: [facility] ENDOSCOPY;  Service: Gastroenterology;  Laterality: N/A;   dual chamber pacemaker  01/11   Dr. Ladona Ridgel   FLEXIBLE SIGMOIDOSCOPY N/A 10/07/2018  Procedure: FLEXIBLE SIGMOIDOSCOPY;  Surgeon: Pasty Spillers, MD;  Location: [facility] ENDOSCOPY;  Service: Endoscopy;  Laterality: N/A;   LAPAROSCOPIC CHOLECYSTECTOMY  1/14   Dr Lemar Livings   PARTIAL COLECTOMY N/A 10/13/2018   Procedure: LAPAROTOMY, LEFT COLECTOMY,;  Surgeon: Ancil Linsey, MD;  Location: [facility] ORS;  Service: General;  Laterality: N/A;   VASECTOMY  in his 89's   Social History:  reports that  he has quit smoking. He has been exposed to tobacco smoke. He has never used smokeless tobacco. He reports that he does not drink alcohol and does not use drugs.  Allergies  Allergen Reactions   Augmentin [Amoxicillin-Pot Clavulanate] Rash   Colchicine Rash   Morphine And Codeine     Shaking, back pain.    Family History  Problem Relation Age of Onset   Hypertension Mother    Alcohol abuse Father    Bladder Cancer Neg Hx    Prostate cancer Neg Hx    Kidney cancer Neg Hx     Prior to Admission medications   Medication Sig Start Date End Date Taking? Authorizing Provider  acetaminophen (TYLENOL) 325 MG tablet Take 650 mg by mouth every 6 (six) hours as needed. Reported on 01/17/2016    [provider]  apixaban (ELIQUIS) 5 MG TABS tablet Take 5 mg by mouth 2 (two) times daily. 06/19/22   [provider]  atorvastatin (LIPITOR) 40 MG tablet Take 80 mg by mouth daily. 02/05/23   [provider]  bisacodyl (DULCOLAX) 10 MG suppository Place 1 suppository (10 mg total) rectally daily as needed for severe constipation. 06/07/23   Gillis Santa, MD  bisacodyl (DULCOLAX) 5 MG EC tablet Take 2 tablets (10 mg total) by mouth 2 (two) times daily. 06/07/23   Gillis Santa, MD  clopidogrel (PLAVIX) 75 MG tablet TAKE ONE TABLET BY MOUTH EVERY DAY FOR BLOOD CLOT PREVENTION FOLLOWING PCI BE SURE TO CHECK WITH YOUR DOCTOR ABOUT HOW LONG YOU SHOULD TAKE THIS MEDICINE 02/01/23   [provider]  finasteride (PROSCAR) 5 MG tablet Take 1 tablet (5 mg total) by mouth daily. 09/13/17   Karie Schwalbe, MD  folic acid (FOLVITE) 1 MG tablet Take 1 tablet (1 mg total) by mouth daily. 06/08/23 09/06/23  Gillis Santa, MD  iron polysaccharides (NIFEREX) 150 MG capsule Take 1 capsule (150 mg total) by mouth daily. 06/08/23 09/06/23  Gillis Santa, MD  pantoprazole (PROTONIX) 40 MG tablet Take 1 tablet (40 mg total) by mouth daily. 06/08/23 07/08/23  Gillis Santa, MD  polyethylene glycol  (MIRALAX / GLYCOLAX) 17 g packet Take 17 g by mouth 2 (two) times daily. Skip the dose if no constipation 06/07/23   Gillis Santa, MD  tamsulosin (FLOMAX) 0.4 MG CAPS capsule TAKE ONE CAPSULE BY MOUTH EVERY DAY Patient taking differently: Take 0.4 mg by mouth at bedtime. 08/16/14   Karie Schwalbe, MD  zinc sulfate 220 (50 Zn) MG capsule Take 1 capsule (220 mg total) by mouth daily. 06/08/23 09/06/23  Gillis Santa, MD    Physical Exam: Vitals:   06/24/23 1749 06/25/23 0007 06/25/23 0148  BP: (!) 90/54 (!) 143/113 (!) 96/55  Pulse: 61 70 74  Resp: 14 18 16   Temp: 98 F (36.7 C)    TempSrc: Oral    SpO2: 98% 100% 100%   Physical Exam Vitals and nursing note reviewed.  Constitutional:      General: He is not in acute distress.    Appearance: He is cachectic.  HENT:  Head: Normocephalic and atraumatic.  Cardiovascular:     Rate and Rhythm: Normal rate and regular rhythm.     Heart sounds: Normal heart sounds.  Pulmonary:     Effort: Pulmonary effort is normal.     Breath sounds: Normal breath sounds.  Abdominal:     Palpations: Abdomen is soft.     Tenderness: There is no abdominal tenderness.  Neurological:     Mental Status: Mental status is at baseline.     Labs on Admission: I have personally reviewed following labs and imaging studies  CBC: Recent Labs  Lab 06/24/23 1810  WBC 6.2  HGB 13.9  HCT 41.2  MCV 94.9  PLT 159   Basic Metabolic Panel: Recent Labs  Lab 06/24/23 1810  NA 138  K 3.5  CL 103  CO2 29  GLUCOSE 109*  BUN 26*  CREATININE 0.57*  CALCIUM 8.6*   GFR: CrCl cannot be calculated (Unknown ideal weight.). Liver Function Tests: Recent Labs  Lab 06/24/23 1810  AST 39  ALT 57*  ALKPHOS 90  BILITOT 0.9  PROT 6.0*  ALBUMIN 2.6*   Recent Labs  Lab 06/24/23 1810  LIPASE 25   No results for input(s): "AMMONIA" in the last 168 hours. Coagulation Profile: No results for input(s): "INR", "PROTIME" in the last 168 hours. Cardiac  Enzymes: No results for input(s): "CKTOTAL", "CKMB", "CKMBINDEX", "TROPONINI" in the last 168 hours. BNP (last 3 results) No results for input(s): "PROBNP" in the last 8760 hours. HbA1C: No results for input(s): "HGBA1C" in the last 72 hours. CBG: No results for input(s): "GLUCAP" in the last 168 hours. Lipid Profile: No results for input(s): "CHOL", "HDL", "LDLCALC", "TRIG", "CHOLHDL", "LDLDIRECT" in the last 72 hours. Thyroid Function Tests: No results for input(s): "TSH", "T4TOTAL", "FREET4", "T3FREE", "THYROIDAB" in the last 72 hours. Anemia Panel: No results for input(s): "VITAMINB12", "FOLATE", "FERRITIN", "TIBC", "IRON", "RETICCTPCT" in the last 72 hours. Urine analysis:    Component Value Date/Time   COLORURINE AMBER (A) 06/25/2023 0145   APPEARANCEUR CLOUDY (A) 06/25/2023 0145   APPEARANCEUR Hazy (A) 01/17/2016 1012   LABSPEC 1.020 06/25/2023 0145   LABSPEC 1.024 10/05/2014 1418   PHURINE 5.0 06/25/2023 0145   GLUCOSEU NEGATIVE 06/25/2023 0145   GLUCOSEU Negative 10/05/2014 1418   HGBUR LARGE (A) 06/25/2023 0145   BILIRUBINUR NEGATIVE 06/25/2023 0145   BILIRUBINUR neg 10/17/2017 1355   BILIRUBINUR Negative 01/17/2016 1012   BILIRUBINUR Negative 10/05/2014 1418   KETONESUR NEGATIVE 06/25/2023 0145   PROTEINUR 30 (A) 06/25/2023 0145   UROBILINOGEN 0.2 10/17/2017 1355   NITRITE NEGATIVE 06/25/2023 0145   LEUKOCYTESUR MODERATE (A) 06/25/2023 0145   LEUKOCYTESUR Negative 10/05/2014 1418    Radiological Exams on Admission: No results found.   Data Reviewed: Relevant notes from primary care and specialist visits, past discharge summaries as available in EHR, including Care Everywhere. Prior diagnostic testing as pertinent to current admission diagnoses Updated medications and problem lists for reconciliation ED course, including vitals, labs, imaging, treatment and response to treatment Triage notes, nursing and pharmacy notes and ED provider's notes Notable results  as noted in HPI   Assessment and Plan: Urinary tract infection Recently treated for Morganella UTI seen on culture from 9/10 Rocephin Follow repeat cultures  Hypotension History of essential hypertension Suspect related to poor oral intake Not meeting sepsis criteria Hold home antihypertensives IV hydration  Unintentional weight loss Failure to thrive/frailty/physical deconditioning Patient was evaluated by neurology during his recent hospitalization.  EMG recommended but could not  get it because of pacemaker PT eval Consider palliative care consult Nutritionist consult  CAD (coronary artery disease) Continue atorvastatin and apixaban  Sick sinus syndrome (HCC) S/p pacemaker Atrial paced rhythm on EKG  PAF (paroxysmal atrial fibrillation) (HCC) Continue Eliquis        DVT prophylaxis: Eliquis  Consults: none  Advance Care Planning:   Code Status: Prior   Family Communication: none.  Called grandson went to voicemail  Disposition Plan: Back to previous home environment  Severity of Illness: The appropriate patient status for this patient is INPATIENT. Inpatient status is judged to be reasonable and necessary in order to provide the required intensity of service to ensure the patient's safety. The patient's presenting symptoms, physical exam findings, and initial radiographic and laboratory data in the context of their chronic comorbidities is felt to place them at high risk for further clinical deterioration. Furthermore, it is not anticipated that the patient will be medically stable for discharge from the hospital within 2 midnights of admission.   * I certify that at the point of admission it is my clinical judgment that the patient will require inpatient hospital care spanning beyond 2 midnights from the point of admission due to high intensity of service, high risk for further deterioration and high frequency of surveillance required.*  Author: Andris Baumann, MD 06/25/2023 3:20 AM  For on call review [email or web address].

## 2023-06-25 NOTE — Evaluation (Addendum)
Clinical/Bedside Swallow Evaluation Patient Details  Name: [name] MRN: [id number] Date of Birth: [birth year]  Today's Date: 06/25/2023 Time: SLP Start Time (ACUTE ONLY): 0930 SLP Stop Time (ACUTE ONLY): 1030 SLP Time Calculation (min) (ACUTE ONLY): 60 min  Past Medical History:  Past Medical History:  Diagnosis Date   Acute diverticulitis 09/2014   [facility]   Allergic rhinitis    BPH (benign prostatic hyperplasia)    CAD (coronary artery disease)    a. 10/2013 MV: low risk; b. 03/2020 Cath: mild-mod nonobs dzs; c. 08/2022 MV: tiny, mildly severe apical lateral ischemia; d. 01/2023 Cath/PCI: LCX 103m (2.25x38 Onyx Frontier DES - dil up to 3mm), OM1 70 (2x15 Onyx Frontier DES); e. 01/2023 relook Cath: LM/LAD no signif dzs, D2 40, LCX/OM1 patent stents, RCA 10p/m/d-->Med Rx.   Cataracts, bilateral    Chronic constipation 01/10/2015   Diastolic dysfunction    a. 09/2009 Echo: Ef 55%, no rwma, GrII DD, mild MR, mildly dil LA, nl RV fxn. PASP 25-62mmHg; b. 06/2022 Echo: EF 60%, GrII DD, nl RV fxn, mild AI.   HOH (hard of hearing)    Hyperlipemia    Hypertension    Osteoarthritis    PAF (paroxysmal atrial fibrillation) (HCC) 09/2009   a. CHA2DS2VASc = 4-->eliquis; b. Prev on flec->d/c 2021 after cath showed nonobs CAD; c. 07/2020 Recurrent AF->Amio-->[facility]). Amio d/c'd early 2022.   Presence of permanent cardiac pacemaker    Sick sinus syndrome (HCC)    a. 10/2009 s/p MDT Adapta ADDRL1 DC PPM; b. 04/2023 s/p Gen change   Sigmoid volvulus [facility])    Past Surgical History:  Past Surgical History:  Procedure Laterality Date   CATARACT EXTRACTION W/PHACO Left 01/16/2017   Procedure: CATARACT EXTRACTION PHACO AND INTRAOCULAR LENS PLACEMENT (IOC);  Surgeon: Sallee Lange, MD;  Location: [facility] ORS;  Service: Ophthalmology;  Laterality: Left;  [place] 1:51.3 AP% 25.9 CDE 54.80 FLUID PACK LOT # [id number] H   COLONOSCOPY WITH PROPOFOL N/A 09/15/2018   Procedure: COLONOSCOPY WITH PROPOFOL;  Surgeon:  Toney Reil, MD;  Location: [facility] ENDOSCOPY;  Service: Gastroenterology;  Laterality: N/A;   dual chamber pacemaker  01/11   Dr. Orpah Melter SIGMOIDOSCOPY N/A 10/07/2018   Procedure: FLEXIBLE SIGMOIDOSCOPY;  Surgeon: Pasty Spillers, MD;  Location: [facility] ENDOSCOPY;  Service: Endoscopy;  Laterality: N/A;   LAPAROSCOPIC CHOLECYSTECTOMY  1/14   Dr Lemar Livings   PARTIAL COLECTOMY N/A 10/13/2018   Procedure: LAPAROTOMY, LEFT COLECTOMY,;  Surgeon: Ancil Linsey, MD;  Location: [facility] ORS;  Service: General;  Laterality: N/A;   VASECTOMY  in his 8's   HPI:  Pt is a 89 y.o. male with medical history significant for anxiety, depression, A-fib on Eliquis, hypertension, history of sick sinus syndrome status post pacemaker placement, CAD s/p DES to LCx and OM 01/2023 at the [facility], recently hospitalized from 8/24 to 06/07/2023 with generalized weakness/unintentional weight loss of undetermined etiology, seen by neurology with plans for outpatient EMG/NCS, and discharged to SNF, was sent to the ED for evaluation of ongoing weakness and failure to thrive.  Since his arrival to SNF, patient had not been eating.  Patient had a recent pansensitive Morganella UTI on 06/18/2023 and was treated with antibiotics but he has had no overall improvement.  Per review of EDPs note spoke with grandson, Hospice is being considered.  OF NOTE: a CT of Chest on 06/01/2023:  Small bilateral pleural effusions with basilar  atelectasis. Diffuse interstitial pattern to the lungs, mostly  peripheral. This  could represent acute edema or chronic fibrosis.  There is a ground-glass nodule in the left apex measuring 1 cm  diameter. This could represent neoplasm or inflammatory process.    Assessment / Plan / Recommendation  Clinical Impression   Pt seen for BSE today. Pt awake, verbal and followed instructions w/ cues. Min HOH. Grandson present. Pt has Baseline physical weakness and overall decline in fucntioning in recent Months per  Grandson. Pt appears Cachectic and reportedly has lost weight w/ poor appetite per Grandson.  On RA, afebrile. WBC WNL.  Pt appears to present w/ grossly functional oropharyngeal phase swallowing w/ No overt neuromuscular deficits noted. However, suspect potential pharyngoesophageal phase dysmotility w/ concern for Retrograde flow increasing risk for aspiration of REFLUX material. Noted mild throat clear and Phlegm intermittently during trials. Pt consumed po trials w/ No immediate, overt clinical s/s of aspiration during po trials.  Pt appears at reduced risk for aspiration following general aspiration precautions.  However, pt appears to have challenging factors that could impact oropharyngeal swallowing to include Chronic deconditioning/weakness, malnourishment, and advanced age as well as decreased shoulder ROM impacting self-feeding abilities. Also suspect potential Esophageal phase dysmotility. These factors can increase risk for aspiration, dysphagia as well as decreased oral intake overall.  Pt has been on a Pureed consistency diet at his SNF in recent weeks requiring some support w/ feeding per report.   During po trials, pt consumed all trials w/ no immediate, overt coughing, decline in vocal quality, or change in respiratory presentation during/post trials. O2 sats remained 97%. Oral phase appeared grossly [facility] w/ timely bolus management and control of bolus propulsion for A-P transfer for swallowing. Oral clearing achieved w/ all trial consistencies -- moistened foods given. No solid foods were given d/t pt's c/o Esophageal phase Dysmotility -- pointing to his upper>mid sternum area.  OM Exam appeared [facility] w/ no unilateral weakness noted. Speech Clear. Pt fed self w/ setup and support.   Recommend continue his Baseline diet of Pureed foods (dys 1) well-moistened foods; Thin liquids -- recommend Cup drinking for best control(pt should hold). Recommend general aspiration precautions, reduce  distractions/talking during meals. Tray and sitting up support. Support w/ feeding as needed. Pills CRUSHED in Puree for safer, easier swallowing d/t c/o Esophageal phase Dysmotility.   Education given on Pills in Puree; food consistencies and options; moistening/flavoring foods; general aspiration precautions; REFLUX precautions to pt and Grandson.  No further Acute ST services indicated as pt appears close to/at his baseline in setting of Esophageal phase Dysmotility. Recommend f/u w/ GI for further needs. MD/NSG to reconsult if any new swallowing needs arise during admit. MD updated. Recommend Dietician f/u for support. Noted Palliative Care/Hospice consulted for D/C POC. SLP Visit Diagnosis: Dysphagia, pharyngoesophageal phase (R13.14) (baseline per report)    Aspiration Risk  Mild aspiration risk;Risk for inadequate nutrition/hydration (from retrograde REFLUX material)    Diet Recommendation   Thin;Dysphagia 1 (puree) (baseline) = continue his Baseline diet of Pureed foods (dys 1) well-moistened foods; Thin liquids -- recommend Cup drinking for best control(pt should hold). Recommend general aspiration precautions, reduce distractions/talking during meals. Tray and sitting up support. Support w/ feeding as needed.   Medication Administration: Crushed with puree    Other  Recommendations Recommended Consults: Consider GI evaluation;Consider esophageal assessment (for Dysmotility complaints; Dietician f/u; Palliative Care/Hospice f/u) Oral Care Recommendations: Oral care BID;Oral care before and after PO;Patient independent with oral care (setup and support)    Recommendations for follow up therapy are one  component of a multi-disciplinary discharge planning process, led by the attending physician.  Recommendations may be updated based on patient status, additional functional criteria and insurance authorization.  Follow up Recommendations Follow physician's recommendations for discharge plan  and follow up therapies (GI f/u for education)      Assistance Recommended at Discharge  FULL  Functional Status Assessment Patient has had a recent decline in their functional status and/or demonstrates limited ability to make significant improvements in function in a reasonable and predictable amount of time  Frequency and Duration  (n/a)   (n/a)       Prognosis Prognosis for improved oropharyngeal function: Guarded (-Fair) Barriers to Reach Goals: Time post onset;Severity of deficits Barriers/Prognosis Comment: unsure of pt's Baseline Cognitive functioning; suspect Baseline Esophageal phase Dysmotility      Swallow Study   General Date of Onset: 06/24/23 HPI: Pt is a 89 y.o. male with medical history significant for anxiety, depression, A-fib on Eliquis, hypertension, history of sick sinus syndrome status post pacemaker placement, CAD s/p DES to LCx and OM 01/2023 at the [place], recently hospitalized from 8/24 to 06/07/2023 with generalized weakness/unintentional weight loss of undetermined etiology, seen by neurology with plans for outpatient EMG/NCS, and discharged to SNF, was sent to the ED for evaluation of ongoing weakness and failure to thrive.  Since his arrival to SNF, patient had not been eating.  Patient had a recent pansensitive Morganella UTI on 06/18/2023 and was treated with antibiotics but he has had no overall improvement.  Per review of EDPs note spoke with grandson, Hospice is being considered.  OF NOTE: a CT of Chest on 06/01/2023:  Small bilateral pleural effusions with basilar  atelectasis. Diffuse interstitial pattern to the lungs, mostly  peripheral. This could represent acute edema or chronic fibrosis.  There is a ground-glass nodule in the left apex measuring 1 cm  diameter. This could represent neoplasm or inflammatory process. Type of Study: Bedside Swallow Evaluation Previous Swallow Assessment: none - pt has been on a Puree diet consistency at SNF per Grandson Diet Prior  to this Study: NPO Temperature Spikes Noted: No (wbc 5.6) Respiratory Status: Room air History of Recent Intubation: No Behavior/Cognition: Alert;Cooperative;Pleasant mood;Distractible;Requires cueing (unsure of baseline Cognitive functioning) Oral Cavity Assessment: Dry (min) Oral Care Completed by SLP: Yes Oral Cavity - Dentition: Adequate natural dentition Vision: Functional for self-feeding Self-Feeding Abilities: Able to feed self;Needs assist;Needs set up Patient Positioning: Upright in bed (needed positioning support) Baseline Vocal Quality: Normal Volitional Cough: Strong Volitional Swallow: Able to elicit    Oral/Motor/Sensory Function Overall Oral Motor/Sensory Function: Within functional limits   Ice Chips Ice chips: Within functional limits Presentation: Spoon (fed; 3 trials)   Thin Liquid Thin Liquid: Within functional limits (no immediate overt s/s) Presentation: Cup;Self Fed (10+ trials) Other Comments: water, coffee    Nectar Thick Nectar Thick Liquid: Not tested   Honey Thick Honey Thick Liquid: Not tested   Puree Puree: Within functional limits Presentation: Self Fed;Spoon (10+ trials)   Solid     Solid: Not tested        Juan Som, MS, CCC-SLP Speech Language Pathologist Rehab Services; [facility] [place] [phone] (ascom) [name] 06/25/2023,2:37 PM

## 2023-06-25 NOTE — ED Notes (Signed)
Lab called to collect pts AM labs.  States they will put pt on list for phlebotomist to draw blood on during morning run.

## 2023-06-25 NOTE — Assessment & Plan Note (Signed)
Recently treated for Morganella UTI seen on culture from 9/10 Rocephin Follow repeat cultures

## 2023-06-25 NOTE — TOC Initial Note (Addendum)
Transition of Care [facility]) - Initial/Assessment Note    Patient Details  Name: [name] MRN: [id number] Date of Birth: 01-Feb-1933  Transition of Care [facility]) CM/SW Contact:    Margarito Liner, LCSW Phone Number: 06/25/2023, 10:30 AM  Clinical Narrative:  Per RN, grandson would like to discuss hospice options. Patient was admitted from [facility] where he has been receiving rehab since 8/30. CSW called grandson, introduced role, and explained that hospice would be discussed. Grandson would like to see if patient qualifies for the hospice home and stated he has already contacted [facility]. If he does not qualify, plan would be to return home with hospice. Redge Gainer with [facility] is aware.    12:23 pm: CSW called grandson and discussed financial information for LTC SNF. He stated they are unable to pay privately. Patient does not have Medicaid. Grandson asked about personal care services through the [place]. His PCP is Dr. Concha Pyo at the [facility]. CSW left a voicemail for her social worker, Olivia Mackie. Will discuss when she calls back. Lucila Maine has also been in contact with Darl Pikes at [facility] but they have been having trouble getting in touch with his PCP as well.              3:21 pm: Received call back from [place] social worker [phone] ext [phone]). Patient would be eligible for PCS but they cannot start the process until they have a discharge date. They would complete initial assessment and then the home health agency would come out to the home to complete the assessment process. Social worker stated that the process can take 7 days.  Expected Discharge Plan:  (TBD) Barriers to Discharge: Continued Medical Work up   Patient Goals and CMS Choice     Choice offered to / list presented to :  Lucila Maine)      Expected Discharge Plan and Services     Post Acute Care Choice: Hospice Living arrangements for the past 2 months: Single Family Home                                       Prior Living Arrangements/Services Living arrangements for the past 2 months: Single Family Home   Patient language and need for interpreter reviewed:: Yes Do you feel safe going back to the place where you live?: Yes      Need for Family Participation in Patient Care: Yes (Comment) Care giver support system in place?: Yes (comment)   Criminal Activity/Legal Involvement Pertinent to Current Situation/Hospitalization: No - Comment as needed  Activities of Daily Living      Permission Sought/Granted Permission sought to share information with : Family Supports    Share Information with NAME: Lewanda Rife     Permission granted to share info w Relationship: Grandson  Permission granted to share info w Contact Information: [phone]  Emotional Assessment Appearance:: Appears stated age       Alcohol / Substance Use: Not Applicable Psych Involvement: No (comment)  Admission diagnosis:  Urinary tract infection [N39.0] Failure to thrive in adult [R62.7] Patient Active Problem List   Diagnosis Date Noted   Urinary tract infection 06/25/2023   Hypotension 06/25/2023   Failure to thrive in adult 06/25/2023   CAD (coronary artery disease)    Acute on chronic heart failure (HCC) 06/01/2023   History of anemia due to vitamin B12 deficiency 06/01/2023  Elevated troponin 06/01/2023   Unintentional weight loss 06/01/2023   Atrial flutter (HCC) 02/28/2023   Bilateral hearing loss 02/28/2023   Cardiac pacemaker in situ 02/28/2023   Complete rotator cuff tear or rupture of left shoulder, not specified as traumatic 02/28/2023   Shortness of breath on exertion 02/28/2023   Bilateral carpal tunnel syndrome 11/08/2022   Bilateral hand numbness 01/31/2022   Neuralgia 10/13/2021   Neuropathy 10/13/2021   Hyperglycemia 10/13/2021   Plantar wart of right foot 06/30/2021   Aortic atherosclerosis (HCC) 08/26/2019   Sensory loss 05/20/2019   Adenoma of  colon    Counseling regarding advanced directives 04/23/2014   Osteoarthritis of left shoulder 10/21/2013   GERD (gastroesophageal reflux disease) 02/09/2013   ACTINIC KERATOSIS 05/04/2010   Pacemaker  medtronic 02/06/2010   Sick sinus syndrome (HCC) 10/27/2009   PAF (paroxysmal atrial fibrillation) (HCC) 10/06/2009   HEPATIC CYST 10/06/2009   Hyperlipemia 07/12/2009   Essential hypertension, benign 07/12/2009   ALLERGIC RHINITIS 07/12/2009   BPH (benign prostatic hyperplasia) 07/12/2009   OSTEOARTHRITIS 07/12/2009   PCP:  Karie Schwalbe, MD Pharmacy:   [facility] [phone] [place], [place] - [address] [place] [address] Phone: [phone] Fax: [phone]  [id number] [place], [place] - [address] AT [facility] [address] [place] [address] Phone: [phone] Fax: [phone]     Social Determinants of Health (SDOH) Social History: SDOH Screenings   Depression (PHQ2-9): Medium Risk (03/06/2023)  Tobacco Use: Medium Risk (06/24/2023)   SDOH Interventions:     Readmission Risk Interventions     No data to display

## 2023-06-25 NOTE — Assessment & Plan Note (Signed)
Continue atorvastatin and apixaban

## 2023-06-25 NOTE — Assessment & Plan Note (Signed)
History of essential hypertension Suspect related to poor oral intake Not meeting sepsis criteria Hold home antihypertensives IV hydration

## 2023-06-25 NOTE — Progress Notes (Signed)
[facility] Liaison Note   Received request from [facility], Charlynn Court, LCSW,  to meet with grandson at bedside to evaluate patient for the inpatient unit and to also discuss Hospice services at home.  Hospice Liaison met with grandson and provided extensive education about hospice services.  Patient is not appropriate for the Hospice Home inpatient unit at this time.  Grandson reported that he did not have enough support in the home for patient to return home with Hospice services. HL spoke with him about patient returning to SNF with outpatient palliative follow up, as well as going to a Long term care facility with Hospice follow-up.  Grandson leaning toward going to LTC with Hospice.  TOC aware and will reach out to grandson to discuss further.      Please call with any questions/concerns.    Thank you for the opportunity to participate in this patient's care.  [facility] Liaison [phone]

## 2023-06-25 NOTE — Evaluation (Signed)
Physical Therapy Evaluation Patient Details Name: NAITHAN SANCHES MRN: [id number] DOB: 01-16-1933 Today's Date: 06/25/2023  History of Present Illness  89 y/o male presented to ED from [facility] on 06/24/23 for increased weakness and malnourishment. Recently hospitalized 8/24-8/30 with generalized waekness/unintentional weight loss. Admitted for failure to thrive and UTI. PMH: Afib on Eliquis, HTN, hx of sick sinus syndrome s/p pacemaker placement, CAD s/p DES 01/2023  Clinical Impression  Patient admitted with the above. PTA, patient recently moved to [facility]F but since last admission, has been at [facility] for rehab and requiring assistance from staff for step/stand pivot to/from w/c. Patient presents with weakness, impaired balance, and decreased activity tolerance. Required min-modA for bed mobility for trunk and LE management. Able to stand from EOB with modA+2 and RW with assist to place hands on RW due to chronic B rotator cuff tears. Easily fatigued and reporting "I can't hold my own body weight on my legs." Patient will benefit from skilled PT services during acute stay to address listed deficits. Patient will benefit from ongoing therapy at discharge to maximize functional independence and safety.         If plan is discharge home, recommend the following: A lot of help with walking and/or transfers;A lot of help with bathing/dressing/bathroom;Assistance with cooking/housework;Assist for transportation;Help with stairs or ramp for entrance   Can travel by private vehicle   No    Equipment Recommendations Other (comment) (TBD)  Recommendations for Other Services       Functional Status Assessment Patient has had a recent decline in their functional status and demonstrates the ability to make significant improvements in function in a reasonable and predictable amount of time.     Precautions / Restrictions Precautions Precautions: Fall Precaution Comments: chronic B  rotator cuff tears Restrictions Weight Bearing Restrictions: No      Mobility  Bed Mobility Overal bed mobility: Needs Assistance Bed Mobility: Supine to Sit, Sit to Supine     Supine to sit: Mod assist Sit to supine: Min assist   General bed mobility comments: assist for trunk elevation and LE management in/out of bed    Transfers Overall transfer level: Needs assistance Equipment used: Rolling Johneisha Broaden (2 wheels) Transfers: Sit to/from Stand Sit to Stand: Mod assist, +2 physical assistance           General transfer comment: assist to place hands on RW. modA+2 to stand from bed surface and maintain standing. Only able to tolerate standing ~30 seconds prior to returning to sit. Stood x 2    Ambulation/Gait               General Gait Details: unable to take steps this date  Acupuncturist Bed    Modified Rankin (Stroke Patients Only)       Balance Overall balance assessment: Needs assistance Sitting-balance support: Feet supported, No upper extremity supported Sitting balance-Leahy Scale: Good     Standing balance support: Bilateral upper extremity supported, Reliant on assistive device for balance Standing balance-Leahy Scale: Poor                               Pertinent Vitals/Pain Pain Assessment Pain Assessment: No/denies pain    Home Living Family/patient expects to be discharged to:: Skilled nursing facility  Additional Comments: Pt states he has been at [facility] for "a few weeks"; wife lives at home; grandson lives a few yards away also on the property.    Prior Function Prior Level of Function : Needs assist             Mobility Comments: Pt reports walking, playing golf, community ambulator roughly 6 months ago. Past 1.5 months pt has been receiving PT at [facility]; typically can ambulate, but lately has been needing assist of staff with RW or w/c  to get to bathroom. Pt endorses 2 recent falls ADLs Comments: Pt states he is typically (I) BADLs, but needing lots of assist in the past few weeks 2/2 increasing weakness.     Extremity/Trunk Assessment   Upper Extremity Assessment Upper Extremity Assessment: Generalized weakness RUE Deficits / Details: pt has chronic BIL RTC injuries at baseline    Lower Extremity Assessment Lower Extremity Assessment: Generalized weakness    Cervical / Trunk Assessment Cervical / Trunk Assessment: Kyphotic  Communication   Communication Communication: Hearing impairment  Cognition Arousal: Alert Behavior During Therapy: WFL for tasks assessed/performed Overall Cognitive Status: Within Functional Limits for tasks assessed                                          General Comments      Exercises     Assessment/Plan    PT Assessment Patient needs continued PT services  PT Problem List Decreased strength;Decreased range of motion;Decreased activity tolerance;Decreased balance;Decreased mobility;Decreased coordination       PT Treatment Interventions DME instruction;Gait training;Functional mobility training;Therapeutic activities;Balance training;Neuromuscular re-education;Therapeutic exercise;Patient/family education    PT Goals (Current goals can be found in the Care Plan section)  Acute Rehab PT Goals Patient Stated Goal: to get stronger PT Goal Formulation: With patient Time For Goal Achievement: 07/09/23 Potential to Achieve Goals: Poor    Frequency Min 1X/week     Co-evaluation               AM-PAC PT "6 Clicks" Mobility  Outcome Measure Help needed turning from your back to your side while in a flat bed without using bedrails?: A Lot Help needed moving from lying on your back to sitting on the side of a flat bed without using bedrails?: A Lot Help needed moving to and from a bed to a chair (including a wheelchair)?: A Lot Help needed standing up  from a chair using your arms (e.g., wheelchair or bedside chair)?: A Lot Help needed to walk in hospital room?: Total Help needed climbing 3-5 steps with a railing? : Total 6 Click Score: 10    End of Session   Activity Tolerance: Patient limited by fatigue Patient left: in bed;with call bell/phone within reach;with bed alarm set;with nursing/sitter in room Nurse Communication: Mobility status PT Visit Diagnosis: Unsteadiness on feet (R26.81);Muscle weakness (generalized) (M62.81);Difficulty in walking, not elsewhere classified (R26.2)    Time: 1610-9604 PT Time Calculation (min) (ACUTE ONLY): 17 min   Charges:   PT Evaluation $PT Eval Moderate Complexity: 1 Mod   PT General Charges $$ ACUTE PT VISIT: 1 Visit         Maylon Peppers, PT, DPT Physical Therapist - [facility]  [facility]   Joyia Riehle A Mckinsley Koelzer 06/25/2023, 3:18 PM

## 2023-06-25 NOTE — ED Notes (Signed)
Family updated on plan for palliative to come to see pt today, and he will be contacted after Feliz Beam- grandson in chart)

## 2023-06-25 NOTE — Assessment & Plan Note (Signed)
Failure to thrive/frailty/physical deconditioning Patient was evaluated by neurology during his recent hospitalization.  EMG recommended but could not get it because of pacemaker PT eval Consider palliative care consult Nutritionist consult

## 2023-06-25 NOTE — Assessment & Plan Note (Signed)
S/p pacemaker Atrial paced rhythm on EKG

## 2023-06-25 NOTE — Assessment & Plan Note (Signed)
-  Continue Eliquis

## 2023-06-25 NOTE — Progress Notes (Signed)
   06/25/23 1504  Spiritual Encounters  Type of Visit Declined chaplain visit  Spiritual Framework  Patient Stress Factors Not reviewed  Spiritual Care Plan  Spiritual Care Issues Still Outstanding No further spiritual care needs at this time (see row info)  Advance Directives (For Healthcare)  Does Patient Have a Medical Advance Directive? Yes  Does patient want to make changes to medical advance directive? No - Patient declined  Type of Advance Directive Healthcare Power of Attorney  Copy of Healthcare Power of Attorney in Chart? No - copy requested  Copy of Living Will in Chart? No - copy requested  Would patient like information on creating a medical advance directive? No - Patient declined  Mental Health Advance Directives  Would patient like information on creating a mental health advance directive? No - Patient declined   Patient did not request prayer and I had a hard time understanding patient. Patient seem not fully aware of what I came in for or if he understood were he was from my assessment.

## 2023-06-25 NOTE — ED Notes (Signed)
Social work Microbiologist due to pt grandson wanting to talk with social work in regards to pt plan after hospitalization.

## 2023-06-25 NOTE — Plan of Care (Signed)
Problem: Education: Goal: Knowledge of General Education information will improve Description: Including pain rating scale, medication(s)/side effects and non-pharmacologic comfort measures Outcome: Not Progressing   Problem: Health Behavior/Discharge Planning: Goal: Ability to manage health-related needs will improve Outcome: Not Progressing   Problem: Clinical Measurements: Goal: Ability to maintain clinical measurements within normal limits will improve Outcome: Not Progressing Goal: Will remain free from infection Outcome: Not Progressing Goal: Diagnostic test results will improve Outcome: Not Progressing Goal: Respiratory complications will improve Outcome: Not Progressing Goal: Cardiovascular complication will be avoided Outcome: Not Progressing   Problem: Activity: Goal: Risk for activity intolerance will decrease Outcome: Not Progressing   Problem: Nutrition: Goal: Adequate nutrition will be maintained Outcome: Not Progressing   Problem: Coping: Goal: Level of anxiety will decrease Outcome: Not Progressing   Problem: Elimination: Goal: Will not experience complications related to bowel motility Outcome: Not Progressing Goal: Will not experience complications related to urinary retention Outcome: Not Progressing   Problem: Pain Managment: Goal: General experience of comfort will improve Outcome: Not Progressing   Problem: Safety: Goal: Ability to remain free from injury will improve Outcome: Not Progressing

## 2023-06-26 DIAGNOSIS — Z515 Encounter for palliative care: Secondary | ICD-10-CM

## 2023-06-26 DIAGNOSIS — I495 Sick sinus syndrome: Secondary | ICD-10-CM | POA: Diagnosis not present

## 2023-06-26 DIAGNOSIS — R531 Weakness: Secondary | ICD-10-CM | POA: Diagnosis not present

## 2023-06-26 DIAGNOSIS — E43 Unspecified severe protein-calorie malnutrition: Secondary | ICD-10-CM | POA: Insufficient documentation

## 2023-06-26 DIAGNOSIS — R627 Adult failure to thrive: Secondary | ICD-10-CM

## 2023-06-26 DIAGNOSIS — I480 Paroxysmal atrial fibrillation: Secondary | ICD-10-CM | POA: Diagnosis not present

## 2023-06-26 LAB — GLUCOSE, CAPILLARY
Glucose-Capillary: 96 mg/dL (ref 70–99)
Glucose-Capillary: 103 mg/dL — ABNORMAL HIGH (ref 70–99)
Glucose-Capillary: 78 mg/dL (ref 70–99)
Glucose-Capillary: 86 mg/dL (ref 70–99)

## 2023-06-26 MED ORDER — VITAMIN C 500 MG PO TABS
500.0000 mg | ORAL_TABLET | Freq: Two times a day (BID) | ORAL | Status: DC
  Administered 2023-06-26 – 2023-07-01 (×10): 500 mg via ORAL
  Filled 2023-06-26 (×10): qty 1

## 2023-06-26 MED ORDER — POTASSIUM CHLORIDE CRYS ER 20 MEQ PO TBCR: 40.0000 meq | EXTENDED_RELEASE_TABLET | Freq: Once | ORAL | Status: DC

## 2023-06-26 MED ORDER — POTASSIUM CHLORIDE 20 MEQ PO PACK
40.0000 meq | PACK | Freq: Once | ORAL | Status: DC
  Filled 2023-06-26: qty 2

## 2023-06-26 MED ORDER — ENSURE ENLIVE PO LIQD
237.0000 mL | Freq: Three times a day (TID) | ORAL | Status: DC
  Administered 2023-06-27 – 2023-07-01 (×13): 237 mL via ORAL

## 2023-06-26 MED ORDER — APIXABAN 2.5 MG PO TABS
2.5000 mg | ORAL_TABLET | Freq: Two times a day (BID) | ORAL | Status: DC
  Administered 2023-06-26 – 2023-07-01 (×10): 2.5 mg via ORAL
  Filled 2023-06-26 (×11): qty 1

## 2023-06-26 MED ORDER — ADULT MULTIVITAMIN W/MINERALS CH
1.0000 | ORAL_TABLET | Freq: Every day | ORAL | Status: DC
  Administered 2023-06-27 – 2023-07-01 (×5): 1 via ORAL
  Filled 2023-06-26 (×5): qty 1

## 2023-06-26 NOTE — Progress Notes (Signed)
PROGRESS NOTE    [name]  NWG:[id number] DOB: 1933/10/06 DOA: 06/24/2023 PCP: Karie Schwalbe, MD  [id number]  LOS: 1 day   Brief hospital course:   Assessment & Plan: [name] is a 89 y.o. male with medical history significant for anxiety, depression, A-fib on Eliquis, hypertension, history of sick sinus syndrome status post pacemaker placement, CAD s/p DES to LCx and OM 01/2023 at the [place], recently hospitalized from 8/24 to 06/07/2023 with generalized weakness/unintentional weight loss of undetermined etiology, seen by neurology with plans for outpatient EMG/NCS, and discharged to SNF, was sent to the ED for evaluation of ongoing weakness and failure to thrive.  Since his arrival to SNF, patient had not been eating.  Patient had a recent pansensitive Morganella UTI on 06/18/2023 and was treated with antibiotics but he has had no overall improvement.    * Urinary tract infection, ruled out --ceftriaxone started on admission.  Urine cx returned neg growth. --d/c ceftriaxone  Hypotension Suspect related to poor oral intake --s/p IVF  weight loss Poor oral intake Severe malnutrition in context of chronic illness  Failure to thrive --palliative consulted, and family requested hospice service --supplement per dietician  CAD (coronary artery disease)  Sick sinus syndrome (HCC) S/p pacemaker Atrial paced rhythm on EKG  PAF (paroxysmal atrial fibrillation) (HCC) Continue Eliquis  Hypokalemia --monitor and supplement PRN   DVT prophylaxis: VH:QIONGEX Code Status: DNR  Family Communication: grandson updated on the phone today Level of care: Med-Surg Dispo:   The patient is from: just discharged from rehab Anticipated d/c is to: home with hospice Anticipated d/c date is: whenever family is ready to take pt home   Subjective and Interval History:  Grandson and RN reported pt was more confused and talking out of his head today.   Objective: Vitals:   06/25/23  2139 06/26/23 0208 06/26/23 0814 06/26/23 1642  BP: (!) 115/53 99/65 120/73 111/67  Pulse: 61 82 60 60  Resp: 18 20 16 18   Temp: 97.6 F (36.4 C) (!) 97.4 F (36.3 C) 97.8 F (36.6 C) 97.8 F (36.6 C)  TempSrc: Oral Oral    SpO2: 99% 99% (!) 89% 100%  Weight:  56.3 kg      Intake/Output Summary (Last 24 hours) at 06/26/2023 1807 Last data filed at 06/26/2023 0900 Gross per 24 hour  Intake 100.06 ml  Output 100 ml  Net 0.06 ml   Filed Weights   06/26/23 0208  Weight: 56.3 kg    Examination:   Constitutional: NAD, alert HEENT: conjunctivae and lids normal, EOMI CV: No cyanosis.   RESP: normal respiratory effort, on RA Neuro: II - XII grossly intact.     Data Reviewed: I have personally reviewed labs and imaging studies  Time spent: 50 minutes  Darlin Priestly, MD [facility] If 7PM-7AM, please contact night-coverage 06/26/2023, 6:07 PM

## 2023-06-26 NOTE — Plan of Care (Signed)

## 2023-06-26 NOTE — Progress Notes (Signed)
Initial Nutrition Assessment  DOCUMENTATION CODES:   Severe malnutrition in context of chronic illness  INTERVENTION:   Ensure Enlive po TID, each supplement provides 350 kcal and 20 grams of protein.  Magic cup TID with meals, each supplement provides 290 kcal and 9 grams of protein  MVI po daily   Vitamin C 500mg  po BID   Pt at high refeed risk; recommend monitor potassium, magnesium and phosphorus labs daily until stable  Daily weights   NUTRITION DIAGNOSIS:   Severe Malnutrition related to chronic illness as evidenced by severe fat depletion, severe muscle depletion, 26 percent weight loss in 6 months.  GOAL:   Patient will meet greater than or equal to 90% of their needs  MONITOR:   PO intake, Supplement acceptance, Labs, Weight trends, I & O's, Skin  REASON FOR ASSESSMENT:   Consult Assessment of nutrition requirement/status  ASSESSMENT:   89 y/o male with h/o HTN, PAF, SSS, pacemaker, CAD s/p DES to LCx and OM 01/2023 (at the [place]), anxiety, depression, partial colectomy, HLD, BPH, HOH, CAD and chronic constipation who is admitted with UTI, weakness and FTT.  Met with pt in room today. Pt is a poor historian and HOH. Pt reports decreased appetite and oral intake at baseline. Pt reports that he did eat a few bites of food today; pt is documented to be eating only sips/bites. Pt reports that he drinks vanilla protein shakes at baseline and he is excited to get these in hospital. RD will add Ensure and Magic Cups per pt's request. Per chart, pt is is down 44lbs(26%) over the past 6 months; this is severe weight loss. RD will add supplements per pt request. Pt is at high refeed risk. Palliative care is following.    Medications reviewed and include: KCl  Labs reviewed: K 3.3(L), creat 0.50(L) Cbgs- 103, 96 x 24 hrs  AIC 5.1- 8/26  NUTRITION - FOCUSED PHYSICAL EXAM:  Flowsheet Row Most Recent Value  Orbital Region Severe depletion  Upper Arm Region Severe depletion   Thoracic and Lumbar Region Severe depletion  Buccal Region Severe depletion  Temple Region Severe depletion  Clavicle Bone Region Severe depletion  Clavicle and Acromion Bone Region Severe depletion  Scapular Bone Region Severe depletion  Dorsal Hand Severe depletion  Patellar Region Severe depletion  Anterior Thigh Region Severe depletion  Posterior Calf Region Severe depletion  Edema (RD Assessment) Mild  Hair Reviewed  Eyes Reviewed  Mouth Reviewed  Skin Reviewed  Nails Reviewed   Diet Order:   Diet Order             DIET - DYS 1 Room service appropriate? Yes with Assist; Fluid consistency: Thin  Diet effective now                  EDUCATION NEEDS:   No education needs have been identified at this time  Skin:  Skin Assessment: Reviewed RN Assessment (Stage II l & R heels, Stage II coccyx)  Last BM:  9/18- type 4  Height:   Ht Readings from Last 1 Encounters:  06/01/23 5\' 10"  (1.778 m)    Weight:   Wt Readings from Last 1 Encounters:  06/26/23 56.3 kg    Ideal Body Weight:  75.45 kg  BMI:  Body mass index is 17.81 kg/m.  Estimated Nutritional Needs:   Kcal:  1700-2000kcal/day  Protein:  85-100g/day  Fluid:  1.4-1.6L/day  Betsey Holiday MS, RD, LDN Please refer to [facility] for RD and/or RD on-call/weekend/after  hours pager

## 2023-06-26 NOTE — Progress Notes (Signed)
MD notified: The patient refused to take his potassium chloride packet and threw his medication on the floor.

## 2023-06-26 NOTE — TOC Progression Note (Addendum)
Transition of Care [facility]) - Progression Note    Patient Details  Name: [name] MRN: [id number] Date of Birth: October 12, 1932  Transition of Care [facility]) CM/SW Contact  Darolyn Rua, [place] Phone Number: 06/26/2023, 10:48 AM  Clinical Narrative:      CSW spoke with [facility] social worker [phone] ext [phone]).   Patient is eligible for PCS services, potential discharge date of 9/19 provided to social worker so she has officially started the process.   She reports in the next 7-10 days they will complete initial assessment and then the home health agency would come out to the home to complete the assessment process.   Tresa Endo informed patient is working with [facility].   Social worker stated that the process can take 7-10 days.   CSW has followed up with [facility] to inquire as to them communicating quote for 7 day private pay services to grandson so he has some home assistance prior to [facility] services being in place, will also be followed by [facility] hospice. They will follow up.   CSW has updated [name] on all the above.    Expected Discharge Plan:  (TBD) Barriers to Discharge: Continued Medical Work up  Expected Discharge Plan and Services     Post Acute Care Choice: Hospice Living arrangements for the past 2 months: Single Family Home                                       Social Determinants of Health (SDOH) Interventions SDOH Screenings   Food Insecurity: Patient Declined (06/25/2023)  Housing: Patient Declined (06/25/2023)  Transportation Needs: Unmet Transportation Needs (06/25/2023)  Utilities: Not At Risk (06/25/2023)  Depression (PHQ2-9): Medium Risk (03/06/2023)  Tobacco Use: Medium Risk (06/24/2023)    Readmission Risk Interventions     No data to display

## 2023-06-26 NOTE — Consult Note (Signed)
Consultation Note Date: 06/26/2023 at 0945  Patient Name: [name]  DOB: 12/04/1932  MRN: [id number]  Age / Sex: 89 y.o., male  PCP: Karie Schwalbe, MD Referring Physician: Darlin Priestly, MD  HPI/Patient Profile: 89 y.o. male  with past medical history of anxiety, depression, A-fib (Eliquis), HTN, sick sinus syndrome s/p pacemaker placement, CAD s/p DES to LCx and OM 01/2023 (at the [facility]), recent hospitalization (August 2024 (for generalized weakness, unintentional weight loss, and plan for outpatient EMG/NCS.  Patient discharged to SNF after August hospitalization but was sent to the ED for ongoing weakness and failure to thrive.  06/1209/2024, patient diagnosed with pansensitive Morganella UTI and treated with antibiotics with no overall improvement.  PMT was consulted to discuss boundaries and goals of care.    Clinical Assessment and Goals of Care: Extensive chart review completed prior to meeting patient including labs, vital signs, imaging, progress notes, orders, and available advanced directive documents from current and previous encounters. I then met with patient at bedside  to discuss diagnosis prognosis, GOC, EOL wishes, disposition and options.  Patient is alert and oriented to self and place, but unable to participate in GOC discussions independently.   Symptoms assessed.  Patient says he is hard of hearing and needs new hearing aid batteries placed.  I assisted patient with replacing batteries.  He was able to hear me more clearly but again, was not able to engage in complex medical decision making or goals of care discussions with me independently.  He shares that he does not really know what is going on but that he feels okay.  I shared that I will be in contact with his family and he shares that this was a good idea.  After meeting with patient, I spoke with his grandson [name] over the phone and  then with patient's daughter/HCPOA [name].  I introduced Palliative Medicine as specialized medical care for people living with serious illness. It focuses on providing relief from the symptoms and stress of a serious illness. The goal is to improve quality of life for both the patient and the family.  We discussed a brief life review of the patient. 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 He has been married to his wife for over 65+ years. They had 3 children (one deceased son - MVA) and two daughters, [name] [facility]) and other (name not mentioned).   As far as functional and nutritional status family endorses patient was independent with ADLs, playing golf, and driving himself up until about a year ago. Both [name] and [name] endorse separately that patient has experienced a significant decline in functional and nutritional status over the last year, with it being accelerated by short times in rehab facilities.   We discussed patient's current illness and what it means in the larger context of patient's on-going co-morbidities.  Education provided on SSS, UTI, AFib, and patient's overall functional decline. Natural disease trajectory discussed.  I attempted to elicit values and goals of care important to the patient. [name]  shares she does not want her father to suffer, and she would like to find somewhere that he can be cared for safely. She does not believe that discharge to home is safe as her mother is not able to care for patient and family cannot provide 24/7 care. I assured her TOC is following closely for discharge planning.    Advance directives, concepts specific to code status, artificial feeding and hydration, and rehospitalization were considered and discussed. [name] plans to email copy of POA to me and I will place is in [place]. [name] shares she knows her father has named her as Product manager and that he would never want extraordinary means to keep him alive. She says he  has stated numerous times in the past that he would never want to be"hooked up to any machines".   Discussed DNR vs full code status in detail. [name] spoke with her mother/patient's wife, and they both are in agreement for patient to be DNR - limited/DNI. Adjustment made to [facility] and medical team updated of change.   Education offered regarding concept specific to human mortality and the limitations of medical interventions to prolong life when the body begins to fail to thrive. Patient's overal decline in functional, nutritional, and cognitive status reviewed as significant indicators of patient's overall prognosis.   Questions and concerns were addressed. PMT contact info given to [employer]. The family was encouraged to call with questions or concerns.   PMT will continue to follow and support patient and family throughout this hospitalization.   Primary Decision Maker HCPOA  Physical Exam Constitutional:      General: He is not in acute distress.    Appearance: He is normal weight.  HENT:     Head: Normocephalic.     Ears:     Comments: HOH - hearing aids    Mouth/Throat:     Mouth: Mucous membranes are moist.  Cardiovascular:     Rate and Rhythm: Normal rate.     Pulses: Normal pulses.  Pulmonary:     Effort: Pulmonary effort is normal.  Musculoskeletal:     Comments: Generalized weakness  Skin:    General: Skin is warm and dry.  Neurological:     Mental Status: He is alert.  Psychiatric:        Mood and Affect: Mood normal.        Behavior: Behavior normal.        Judgment: Judgment normal.     Palliative Assessment/Data: 40%     Thank you for this consult. Palliative medicine will continue to follow and assist holistically.   Time spent includes: Detailed review of medical records (labs, imaging, vital signs), medically appropriate exam (mental status, respiratory, cardiac, skin), discussed with treatment team, counseling and educating patient, family  and staff, documenting clinical information, medication management and coordination of care.  Time Total: 75 minutes  Signed by: Georgiann Cocker, DNP, FNP-BC Palliative Medicine    Please contact Palliative Medicine Team phone at [phone] for questions and concerns.  For individual provider: See Loretha Stapler

## 2023-06-26 NOTE — Progress Notes (Signed)
[facility] Liaison Note  Follow up on new referral for hospice services at home.  Patient did not meet criteria for InPatient Hospice Unit.  SW/TOC at [facility] reaching out to [place] for additional benefits/assistance in the home.  Patient may be eligible for PCS in home.  Hospital team will continue to follow through final disposition.  Please do not hesitate to call with any hospice related questions or concerns.  Thank you for allowing participation in this patient's care.  Norris Cross, RN Nurse Liaison [phone]

## 2023-06-27 DIAGNOSIS — R531 Weakness: Secondary | ICD-10-CM | POA: Diagnosis not present

## 2023-06-27 DIAGNOSIS — Z515 Encounter for palliative care: Secondary | ICD-10-CM | POA: Diagnosis not present

## 2023-06-27 DIAGNOSIS — N390 Urinary tract infection, site not specified: Secondary | ICD-10-CM | POA: Diagnosis not present

## 2023-06-27 DIAGNOSIS — R627 Adult failure to thrive: Secondary | ICD-10-CM | POA: Diagnosis not present

## 2023-06-27 MED ORDER — SENNOSIDES-DOCUSATE SODIUM 8.6-50 MG PO TABS
2.0000 | ORAL_TABLET | Freq: Once | ORAL | Status: AC
  Administered 2023-06-27: 2 via ORAL
  Filled 2023-06-27: qty 2

## 2023-06-27 NOTE — Progress Notes (Signed)
Palliative Care Progress Note, Assessment & Plan   Patient Name: [name]       Date: 06/27/2023 DOB: Mar 26, 1933  Age: 89 y.o. MRN#: [id number] Attending Physician: Darlin Priestly, MD Primary Care Physician: Karie Schwalbe, MD Admit Date: 06/24/2023  Subjective: Patient is lying in bed, curled up on his right side.  He is asleep but is easily awakened.  He acknowledges my presence and is able to make his wishes known.  No family or friends present during my visit.  HPI: 89 y.o. male  with past medical history of anxiety, depression, A-fib (Eliquis), HTN, sick sinus syndrome s/p pacemaker placement, CAD s/p DES to LCx and OM 01/2023 (at the [facility]), recent hospitalization (August 2024 (for generalized weakness, unintentional weight loss, and plan for outpatient EMG/NCS.  Patient discharged to SNF after August hospitalization but was sent to the ED for ongoing weakness and failure to thrive.   06/1209/2024, patient diagnosed with pansensitive Morganella UTI and treated with antibiotics with no overall improvement.   PMT was consulted to discuss boundaries and goals of care.    Summary of counseling/coordination of care: Extensive chart review completed prior to meeting patient including labs, vital signs, imaging, progress notes, orders, and available advanced directive documents from current and previous encounters.   After reviewing the patient's chart and assessing the patient at bedside, I spoke with patient in regards to symptom management.  Symptoms assessed.  Patient denies pain or discomfort at this time.  He denies N/V/D, headache, chest pain, or other acute issues at this time.  No adjustment to [facility] needed.  I attempted to assess patient's understanding of his current medical situation.  He said he  does not know but that he feels fine.  I reframed the question several ways and each time he was unable to share with me why he is in the hospital.  After meeting with the patient, I spoke with his daughter/HCPOA Elease Hashimoto over the phone.  She endorses the plan remains for patient to discharge home with hospice services and private pay care until [place] benefits can help pay for in home help.  Goals clear. Symptom burden low.   Patient/family have PMT contact information and were encouraged to contact PMT with any future acute palliative needs.   PMT will monitor the patient peripherally and shadow his chart.   Please re-engage with PMT if goals change, at patient/family's request, or if patient's health deteriorates during hospitalization.    Physical Exam Vitals reviewed.  Constitutional:      General: He is not in acute distress.    Appearance: He is normal weight.  HENT:     Head: Normocephalic.     Mouth/Throat:     Mouth: Mucous membranes are moist.  Eyes:     Pupils: Pupils are equal, round, and reactive to light.  Pulmonary:     Effort: Pulmonary effort is normal.  Abdominal:     Palpations: Abdomen is soft.  Musculoskeletal:     Comments: MAETC, generalized weakness  Skin:    General: Skin is warm and dry.  Neurological:     Mental Status: He is alert.     Comments: Alert and oriented to  self and place  Psychiatric:        Behavior: Behavior normal.        Judgment: Judgment normal.             Time spent includes: Detailed review of medical records (labs, imaging, vital signs), medically appropriate exam (mental status, respiratory, cardiac, skin), discussed with treatment team, counseling and educating patient, family and staff, documenting clinical information, medication management and coordination of care.  Total Time 35 minutes   Tinslee Klare L. Bonita Quin, DNP, FNP-BC Palliative Medicine Team

## 2023-06-27 NOTE — Progress Notes (Signed)
[facility]- Automotive engineer with grandson earlier today.  He stated that the patient will need a hospital bed, over the bed table and BSC delivered to the home. DME company to contact the grandson to coordinate delivery.  Patient already has a wheelchair, walker and shower chair.     Please don't hesitate to call with any Hospice related questions or concerns.    Thank you for the opportunity to participate in this patient's care. [facility] Liaison [phone]

## 2023-06-27 NOTE — Progress Notes (Addendum)
PROGRESS NOTE    [name]  ZOX:[id number] DOB: [birth year] DOA: 06/24/2023 PCP: Karie Schwalbe, MD  222A/222A-AA  LOS: 2 days   Brief hospital course:   Assessment & Plan: [name] is a 89 y.o. male with medical history significant for anxiety, depression, A-fib on Eliquis, hypertension, history of sick sinus syndrome status post pacemaker placement, CAD s/p DES to LCx and OM 01/2023 at the [place], recently hospitalized from 8/24 to 06/07/2023 with generalized weakness/unintentional weight loss of undetermined etiology, seen by neurology with plans for outpatient EMG/NCS, and discharged to SNF, was sent to the ED for evaluation of ongoing weakness and failure to thrive.  Since his arrival to SNF, patient had not been eating.  Patient had a recent pansensitive Morganella UTI on 06/18/2023 and was treated with antibiotics but he has had no overall improvement.    * Urinary tract infection, ruled out --ceftriaxone started on admission.  Urine cx returned neg growth. --d/c'ed ceftriaxone  Hypotension Suspect related to poor oral intake --s/p IVF with improvement  weight loss Poor oral intake Severe malnutrition in context of chronic illness  Failure to thrive --palliative consulted, and family requested hospice service  CAD (coronary artery disease)  Sick sinus syndrome (HCC) S/p pacemaker Atrial paced rhythm on EKG  PAF (paroxysmal atrial fibrillation) (HCC) Continue Eliquis  Hypokalemia --monitor and supplement PRN  Left heel stage 2, Right heel stage 2 and coccyx stage 2 pressure injuries, present on admission    DVT prophylaxis: [id number] Code Status: DNR  Family Communication:  Level of care: Med-Surg Dispo:   The patient is from: just discharged from rehab Anticipated d/c is to: home with hospice Anticipated d/c date is: whenever family is ready to take pt home   Subjective and Interval History:  Pt had no complaint today.   Objective: Vitals:    06/26/23 1642 06/26/23 1947 06/27/23 0826 06/27/23 1656  BP: 111/67 (!) 106/57 116/65 (!) 105/58  Pulse: 60 60 (!) 59 (!) 59  Resp: 18 18 18 18   Temp: 97.8 F (36.6 C) 97.6 F (36.4 C) 97.6 F (36.4 C) 98.4 F (36.9 C)  TempSrc:  Oral    SpO2: 100% 98% 100% 99%  Weight:        Intake/Output Summary (Last 24 hours) at 06/27/2023 1856 Last data filed at 06/27/2023 1431 Gross per 24 hour  Intake 480 ml  Output 200 ml  Net 280 ml   Filed Weights   06/26/23 0208  Weight: 56.3 kg    Examination:   Constitutional: NAD, alert HEENT: conjunctivae and lids normal, EOMI CV: No cyanosis.   RESP: normal respiratory effort, on RA Extremities: No effusions, edema in BLE SKIN: warm, dry Neuro: II - XII grossly intact.     Data Reviewed: I have personally reviewed labs and imaging studies  Time spent: 25 minutes  Darlin Priestly, MD [facility] If 7PM-7AM, please contact night-coverage 06/27/2023, 6:56 PM

## 2023-06-28 DIAGNOSIS — Z950 Presence of cardiac pacemaker: Secondary | ICD-10-CM

## 2023-06-28 DIAGNOSIS — R634 Abnormal weight loss: Secondary | ICD-10-CM

## 2023-06-28 DIAGNOSIS — E43 Unspecified severe protein-calorie malnutrition: Secondary | ICD-10-CM

## 2023-06-28 DIAGNOSIS — N390 Urinary tract infection, site not specified: Secondary | ICD-10-CM | POA: Diagnosis not present

## 2023-06-28 DIAGNOSIS — I959 Hypotension, unspecified: Secondary | ICD-10-CM

## 2023-06-28 DIAGNOSIS — I480 Paroxysmal atrial fibrillation: Secondary | ICD-10-CM

## 2023-06-28 DIAGNOSIS — I495 Sick sinus syndrome: Secondary | ICD-10-CM

## 2023-06-28 MED ORDER — POLYETHYLENE GLYCOL 3350 17 G PO PACK
17.0000 g | PACK | Freq: Every day | ORAL | Status: DC
  Administered 2023-06-28 – 2023-07-01 (×4): 17 g via ORAL
  Filled 2023-06-28 (×4): qty 1

## 2023-06-28 MED ORDER — FINASTERIDE 5 MG PO TABS
5.0000 mg | ORAL_TABLET | Freq: Every day | ORAL | Status: DC
  Administered 2023-06-28 – 2023-07-01 (×4): 5 mg via ORAL
  Filled 2023-06-28 (×4): qty 1

## 2023-06-28 MED ORDER — SERTRALINE HCL 50 MG PO TABS
50.0000 mg | ORAL_TABLET | Freq: Every day | ORAL | Status: DC
  Administered 2023-06-28 – 2023-07-01 (×4): 50 mg via ORAL
  Filled 2023-06-28 (×4): qty 1

## 2023-06-28 NOTE — Progress Notes (Signed)
PROGRESS NOTE    [name]  WUX:[id number] DOB: 27-Oct-1932 DOA: 06/24/2023 PCP: Karie Schwalbe, MD  222A/222A-AA  LOS: 3 days   Brief hospital course: [name] is a 89 y.o. male with medical history significant for anxiety, depression, A-fib on Eliquis, hypertension, history of sick sinus syndrome status post pacemaker placement, CAD s/p DES to LCx and OM 01/2023 at the [place], recently hospitalized from 8/24 to 06/07/2023 with generalized weakness/unintentional weight loss of undetermined etiology, seen by neurology with plans for outpatient EMG/NCS, and discharged to SNF, was sent to the ED for evaluation of ongoing weakness and failure to thrive.  Since his arrival to SNF, patient had not been eating.  Patient had a recent pansensitive Morganella UTI on 06/18/2023 and was treated with antibiotics but he has had no overall improvement.   9/20: Family decided to proceed with hospice care.  Plan is to go home with hospice, grandson does not want him back home until all the services has been established, TOC is working on it.  Assessment & Plan:  * Urinary tract infection, ruled out --ceftriaxone started on admission.  Urine cx returned neg growth. --d/c'ed ceftriaxone  Hypotension Suspect related to poor oral intake --s/p IVF with improvement  weight loss Poor oral intake Severe malnutrition in context of chronic illness  Failure to thrive --palliative consulted, and family requested hospice service  CAD (coronary artery disease)  Sick sinus syndrome (HCC) S/p pacemaker Atrial paced rhythm on EKG  PAF (paroxysmal atrial fibrillation) (HCC) Continue Eliquis  Hypokalemia --monitor and supplement PRN  Left heel stage 2, Right heel stage 2 and coccyx stage 2 pressure injuries, present on admission    DVT prophylaxis: [id number] Code Status: DNR  Family Communication:  Level of care: Med-Surg Dispo:   The patient is from: just discharged from rehab Anticipated d/c is  to: home with hospice Anticipated d/c date is: whenever family is ready to take pt home   Subjective and Interval History:  Patient was seen and examined today.  Denies any pain.  Oriented to self only.  Objective: Vitals:   06/28/23 0409 06/28/23 0410 06/28/23 0813 06/28/23 1555  BP: (!) 107/58  112/65 (!) 102/55  Pulse: 60  61 60  Resp: 17  18 18   Temp: 97.9 F (36.6 C)  97.9 F (36.6 C) 97.9 F (36.6 C)  TempSrc: Oral     SpO2: 100%  100% 100%  Weight:  58.6 kg      Intake/Output Summary (Last 24 hours) at 06/28/2023 1636 Last data filed at 06/28/2023 0815 Gross per 24 hour  Intake 0 ml  Output 275 ml  Net -275 ml   Filed Weights   06/26/23 0208 06/28/23 0410  Weight: 56.3 kg 58.6 kg    Examination:   General.  Frail and malnourished elderly man, in no acute distress. Pulmonary.  Lungs clear bilaterally, normal respiratory effort. CV.  Regular rate and rhythm, no JVD, rub or murmur. Abdomen.  Soft, nontender, nondistended, BS positive. CNS.  Alert and oriented to self only.  No focal neurologic deficit. Extremities.  No edema, no cyanosis, pulses intact and symmetrical. Psychiatry.  Judgment and insight appears impaired   Data Reviewed: I have personally reviewed labs and imaging studies  Time spent: 40 minutes  This record has been created using Conservation officer, historic buildings. Errors have been sought and corrected,but may not always be located. Such creation errors do not reflect on the standard of care.   Arnetha Courser,  MD [facility] If 7PM-7AM, please contact night-coverage 06/28/2023, 4:36 PM

## 2023-06-28 NOTE — Progress Notes (Signed)
[facility]- [facility] bed, over the bed table and BSC to be delivered to the home today between 1-5pm.  TOC and MD aware.  Grandson does not want to schedule AV until he has a confirmed date as to when [facility] services can start through [facility].     Please don't hesitate to call with any Hospice related questions or concerns.    Thank you for the opportunity to participate in this patient's care. [facility] Liaison [phone]

## 2023-06-28 NOTE — TOC Progression Note (Addendum)
Transition of Care [facility]) - Progression Note    Patient Details  Name: [name] MRN: [id number] Date of Birth: 1933-05-14  Transition of Care [facility]) CM/SW Contact  Darolyn Rua, [place] Phone Number: 06/28/2023, 9:42 AM  Clinical Narrative:     CSW spoke with patient grandson Feliz Beam who reports that dme will be delivered from [facility] between 1-5 today, Feliz Beam was informed that MD looking at discharge today. Feliz Beam reports meeting with [facility] this morning to review personal care services and when they can start with their staffing, pending follow up from [facility].   [facility] to assist until [facility]'s [facility] P[facility] services goes through, application is in process.   Update 4:16 pm: CSW has attempted to reach [place] with [facility] for specific time frame of PCS services being initiated, no answer at this time. TOC supervisor aware. Patient grandson was offered ability to dc home, then [facility] to see patient over the weekend and can potentially admit to respite hospice until [facility] has staff to cover [facility] services. Grandson refuses and reports wanting [facility] in home prior to discharge home. TOC supervisor aware this could be until the middle of next week, TOC supervisor Danford Bad reports okay for patient to stay until  mid next week.   Patient has no further needs from [facility] at this time.  Pending [facility] staffing to communicate start date with grandson Feliz Beam, [facility] hospice services is set up, per MD patient is medically ready today for discharge.    Expected Discharge Plan:  (TBD) Barriers to Discharge: Continued Medical Work up  Expected Discharge Plan and Services     Post Acute Care Choice: Hospice Living arrangements for the past 2 months: Single Family Home                                       Social Determinants of Health (SDOH) Interventions SDOH Screenings   Food Insecurity: Patient Declined (06/25/2023)  Housing: Patient Declined (06/25/2023)  Transportation Needs: Unmet  Transportation Needs (06/25/2023)  Utilities: Not At Risk (06/25/2023)  Depression (PHQ2-9): Medium Risk (03/06/2023)  Tobacco Use: Medium Risk (06/24/2023)    Readmission Risk Interventions     No data to display

## 2023-06-28 NOTE — Progress Notes (Signed)
PT Cancellation Note  Patient Details Name: [name] MRN: [id number] DOB: Oct 23, 1932   Cancelled Treatment:    Reason Eval/Treat Not Completed: Other (comment) Per Dr Nelson Chimes and Palliative NP, patient going home with hospice and no further need for PT. PT will complete orders at this time.  Maylon Peppers, PT, DPT Physical Therapist -   [facility]    [name] 06/28/2023, 10:03 AM

## 2023-06-28 NOTE — TOC Progression Note (Signed)
Transition of Care [facility]) - Progression Note    Patient Details  Name: [name] MRN: [id number] Date of Birth: 05-Apr-1933  Transition of Care [facility]) CM/SW Contact  Harriet Masson, RN Phone Number: 06/28/2023, 3:28 PM  Clinical Narrative:    Patient will be safe to discharge home with hospice care once [facility] can provide staffing for support.    Expected Discharge Plan:  (TBD) Barriers to Discharge: Continued Medical Work up  Expected Discharge Plan and Services     Post Acute Care Choice: Hospice Living arrangements for the past 2 months: Single Family Home                                       Social Determinants of Health (SDOH) Interventions SDOH Screenings   Food Insecurity: Patient Declined (06/25/2023)  Housing: Patient Declined (06/25/2023)  Transportation Needs: Unmet Transportation Needs (06/25/2023)  Utilities: Not At Risk (06/25/2023)  Depression (PHQ2-9): Medium Risk (03/06/2023)  Tobacco Use: Medium Risk (06/24/2023)    Readmission Risk Interventions     No data to display

## 2023-06-28 NOTE — TOC Progression Note (Signed)
Transition of Care [facility] - Progression Note    Patient Details  Name: NICKY KJELLBERG MRN: [id number] Date of Birth: August 23, 1933  Transition of Care [facility]) CM/SW Contact  Darolyn Rua, [place] Phone Number: 06/28/2023, 4:35 PM  Clinical Narrative:     CSW spoke with Cammy Copa with [facility] reports they can have staffing for [facility] tomorrow, reports that weekend staff need to call or text her in the morning of 9/21 to confirm staffing and then patient can be discharged.   Cammy Copa can be reached at :[phone]  Patient is receiving all dme at home with [facility] today, hospice on board. MD and [facility] supervisor updated no other needs.    Expected Discharge Plan:  (TBD) Barriers to Discharge: Continued Medical Work up  Expected Discharge Plan and Services     Post Acute Care Choice: Hospice Living arrangements for the past 2 months: Single Family Home                                       Social Determinants of Health (SDOH) Interventions SDOH Screenings   Food Insecurity: Patient Declined (06/25/2023)  Housing: Patient Declined (06/25/2023)  Transportation Needs: Unmet Transportation Needs (06/25/2023)  Utilities: Not At Risk (06/25/2023)  Depression (PHQ2-9): Medium Risk (03/06/2023)  Tobacco Use: Medium Risk (06/24/2023)    Readmission Risk Interventions     No data to display

## 2023-06-28 NOTE — Plan of Care (Signed)
  Problem: Education: Goal: Knowledge of General Education information will improve Description Including pain rating scale, medication(s)/side effects and non-pharmacologic comfort measures Outcome: Progressing   

## 2023-06-29 DIAGNOSIS — Z950 Presence of cardiac pacemaker: Secondary | ICD-10-CM | POA: Diagnosis not present

## 2023-06-29 DIAGNOSIS — N390 Urinary tract infection, site not specified: Secondary | ICD-10-CM | POA: Diagnosis not present

## 2023-06-29 DIAGNOSIS — I959 Hypotension, unspecified: Secondary | ICD-10-CM | POA: Diagnosis not present

## 2023-06-29 DIAGNOSIS — R634 Abnormal weight loss: Secondary | ICD-10-CM | POA: Diagnosis not present

## 2023-06-29 NOTE — Plan of Care (Signed)

## 2023-06-29 NOTE — TOC Progression Note (Signed)
Transition of Care [facility]) - Progression Note    Patient Details  Name: [name] MRN: [id number] Date of Birth: May 27, 1933  Transition of Care [facility]) CM/SW Contact  Bing Quarry, RN Phone Number: 06/29/2023, 1:56 PM  Clinical Narrative: 06/29/23: Lucila Maine came to unit and requested update on plan. Provider spoke to grandson and relayed to [facility] that he stated that "care  company can now see patient tomorrow". RN CM reached out to both Industrial/product designer, Verlon Au at [facility] to confirm.   Verlon Au indicated there has been no new outreach to him since Friday at 522 pm, no contact from grandson, and their plan is still to start service Monday beginning from 9 am-2 pm, per earlier discussion with this RN CM, due to re-allocation of staffing after refusal.    Additionally, even if services could start earlier transportation barriers/issues exist today and Sunday via EMS over the weekend, and  cannot be confirmed due to high volume of emergency calls today per EMS supervisor.   On 07/01/23 Verlon Au or [facility] rep will coordinate with SW or RN CM to synchronize start time with actual discharge/transport time on Monday.   RN CM will attempt to reach out to grandson to update on this plan, but has left the hospital to return to work per unit RN. [facility] liaison also placed note for start visit on 07/01/23.   Summary:   Barriers to safe discharge to home with hospice and PCS start of service: Grandson refused Saturday 06/29/23 start of PCS by [facility] when contacted by scheduler at 522 pm Friday 06/28/23.  Upon refusal of service start, [facility] had to re-allocate staffing resources to other needs over the weekend.  RN CM discussed with [facility] rep and supervisor and a plan was made to start service Monday 07/01/23.  Grandson came to unit indicating the care company had reached out to him and could start service Sunday, but Supervisor and [facility] rep, indicated they had not reached out again nor had had any notification that grandson  had reached back out since Friday at 522 pm contact.  Barriers to time synchronized EMS transport over the weekend, if any at all and no other weekend options, due to high volume of emergency calls and need for ALS trucks.   Plan: [facility] will reach out to SW/CM Monday am to coordinate times for discharge along with confirmation of transportation timing for start of service 9 am-12 pm Monday 07/01/23. Update provider and unit RN. [facility] liaison aware.   Gabriel Cirri MSN RN CM  Transitions of Care Department [facility] [phone] Weekends Only     Expected Discharge Plan:  (TBD) Barriers to Discharge: Continued Medical Work up  Expected Discharge Plan and Services     Post Acute Care Choice: Hospice Living arrangements for the past 2 months: Single Family Home                                       Social Determinants of Health (SDOH) Interventions SDOH Screenings   Food Insecurity: Patient Declined (06/25/2023)  Housing: Patient Declined (06/25/2023)  Transportation Needs: Unmet Transportation Needs (06/25/2023)  Utilities: Not At Risk (06/25/2023)  Depression (PHQ2-9): Medium Risk (03/06/2023)  Tobacco Use: Medium Risk (06/24/2023)    Readmission Risk Interventions     No data to display

## 2023-06-29 NOTE — Progress Notes (Signed)
[facility] LIAISON NOTE  Spoke with patient's grandson, Feliz Beam, to discuss discharge plan.  Collaboration with Gabriel Cirri, RN TOC and plan is for patient to discharge Monday when [facility] can staff for patient's care.  Hospice assessment visit scheduled for Monday 9.23.24 at 1800.  Feliz Beam knows to call this RN with any hospice related questions or concerns, as I will be covering through Monday.  Collaboration with family and hospital team is ongoing through final disposition.  Thank you for allowing particiaption with this patient's care.  Norris Cross ,RN Nurse Liaison [phone]

## 2023-06-29 NOTE — TOC Progression Note (Signed)
Transition of Care [facility]) - Progression Note    Patient Details  Name: [name] MRN: [id number] Date of Birth: 1933-08-15  Transition of Care [facility]) CM/SW Contact  Bing Quarry, RN Phone Number: 06/29/2023, 2:29 PM  Clinical Narrative:  06/29/23: See noted barriers to discharge below.   Gabriel Cirri MSN RN CM  Transitions of Care Department [facility] [phone] Weekends Only      Expected Discharge Plan:  (TBD) Barriers to Discharge: Transportation, Other (must enter comment) (PCS services had to rescheduled after caretaker grandson refused start of service planned for today. Transportation issues with EMS over weekend even if could start PCS services sooner. PCS needed with hospice at home for safe dc plan for total care pt.)  Expected Discharge Plan and Services     Post Acute Care Choice: Hospice Living arrangements for the past 2 months: Single Family Home                                       Social Determinants of Health (SDOH) Interventions SDOH Screenings   Food Insecurity: Patient Declined (06/25/2023)  Housing: Patient Declined (06/25/2023)  Transportation Needs: Unmet Transportation Needs (06/25/2023)  Utilities: Not At Risk (06/25/2023)  Depression (PHQ2-9): Medium Risk (03/06/2023)  Tobacco Use: Medium Risk (06/24/2023)    Readmission Risk Interventions     No data to display

## 2023-06-29 NOTE — Progress Notes (Signed)
PROGRESS NOTE    [name]  ZOX:[id number] DOB: [birth year] DOA: 06/24/2023 PCP: Karie Schwalbe, MD  [id number]-AA  LOS: 4 days   Brief hospital course: [name] is a 89 y.o. male with medical history significant for anxiety, depression, A-fib on Eliquis, hypertension, history of sick sinus syndrome status post pacemaker placement, CAD s/p DES to LCx and OM 01/2023 at the [place], recently hospitalized from 8/24 to 06/07/2023 with generalized weakness/unintentional weight loss of undetermined etiology, seen by neurology with plans for outpatient EMG/NCS, and discharged to SNF, was sent to the ED for evaluation of ongoing weakness and failure to thrive.  Since his arrival to SNF, patient had not been eating.  Patient had a recent pansensitive Morganella UTI on 06/18/2023 and was treated with antibiotics but he has had no overall improvement.   9/20: Family decided to proceed with hospice care.  Plan is to go home with hospice, grandson does not want him back home until all the services has been established, TOC is working on it.  9/21: Medically stable, likely be going home with hospice after arranging all the services on Monday.  Assessment & Plan:  * Urinary tract infection, ruled out --ceftriaxone started on admission.  Urine cx returned neg growth. --d/c'ed ceftriaxone  Hypotension Suspect related to poor oral intake --s/p IVF with improvement  weight loss Poor oral intake Severe malnutrition in context of chronic illness  Failure to thrive --palliative consulted, and family requested hospice service  CAD (coronary artery disease)  Sick sinus syndrome (HCC) S/p pacemaker Atrial paced rhythm on EKG  PAF (paroxysmal atrial fibrillation) (HCC) Continue Eliquis  Hypokalemia --monitor and supplement PRN  Left heel stage 2, Right heel stage 2 and coccyx stage 2 pressure injuries, present on admission    DVT prophylaxis: [id number] Code Status: DNR  Family Communication:   Level of care: Med-Surg Dispo:   The patient is from: just discharged from rehab Anticipated d/c is to: home with hospice Anticipated d/c date is: whenever family is ready to take pt home   Subjective and Interval History:  Patient with no new concern.  Wants to go home.  Objective: Vitals:   06/28/23 1931 06/29/23 0410 06/29/23 0500 06/29/23 0810  BP: (!) 93/50 (!) 95/56  (!) 95/48  Pulse: 60 60  60  Resp:  18  16  Temp:  98.2 F (36.8 C)  98.2 F (36.8 C)  TempSrc:  Oral    SpO2: 98% 100%  100%  Weight:   59 kg     Intake/Output Summary (Last 24 hours) at 06/29/2023 1525 Last data filed at 06/29/2023 1440 Gross per 24 hour  Intake 60 ml  Output 100 ml  Net -40 ml   Filed Weights   06/26/23 0208 06/28/23 0410 06/29/23 0500  Weight: 56.3 kg 58.6 kg 59 kg    Examination:   General.  Frail and malnourished elderly man, in no acute distress. Pulmonary.  Lungs clear bilaterally, normal respiratory effort. CV.  Regular rate and rhythm, no JVD, rub or murmur. Abdomen.  Soft, nontender, nondistended, BS positive. CNS.  Alert and oriented .  No focal neurologic deficit. Extremities.  No edema, no cyanosis, pulses intact and symmetrical. Psychiatry.  Appears to have some cognitive impairment  Data Reviewed: I have personally reviewed labs and imaging studies  Time spent: 39 minutes  This record has been created using Conservation officer, historic buildings. Errors have been sought and corrected,but may not always be located. Such creation errors  do not reflect on the standard of care.   Arnetha Courser, MD [facility] If 7PM-7AM, please contact night-coverage 06/29/2023, 3:25 PM

## 2023-06-29 NOTE — Hospital Course (Signed)
SHAWNTAY WHITTET is a 89 y.o. male with medical history significant for anxiety, depression, A-fib on Eliquis, hypertension, history of sick sinus syndrome status post pacemaker placement, CAD s/p DES to LCx and OM 01/2023 at the [place], recently hospitalized from 8/24 to 06/07/2023 with generalized weakness/unintentional weight loss of undetermined etiology, seen by neurology with plans for outpatient EMG/NCS, and discharged to SNF, was sent to the ED for evaluation of ongoing weakness and failure to thrive.  Since his arrival to SNF, patient had not been eating.  Patient had a recent pansensitive Morganella UTI on 06/18/2023 and was treated with antibiotics but he has had no overall improvement.    9/20: Family decided to proceed with hospice care.  Plan is to go home with hospice.  Family took some time to prepare the home and likely be going back home on Monday after arranging all the services.  9/23: Patient remained medically stable to go home with hospice.  No concern.  Hospice equipment has been delivered over the weekend.  For further need he will follow-up with hospice physician or with his PCP.

## 2023-06-29 NOTE — TOC Progression Note (Addendum)
Transition of Care [facility]) - Progression Note    Patient Details  Name: [name] MRN: [id number] Date of Birth: 03-Apr-1933  Transition of Care [facility]) CM/SW Contact  Bing Quarry, RN Phone Number: 06/29/2023, 9:18 AM  Clinical Narrative:  0915: Starleen Blue at [facility]) at [phone], the agency providing private care services for this patient on discharge and to be in place prior to discharge as patient's needs are that of total care.   [facility] indicated the last update received was that staffing was set up to begin today if discharged, but that son refused the staffing till Monday due to not all DME being delivered or set up yet and not all paperwork signed.   [facility] was going to reach out to supervisor as representative is [place], but will text me back any updates this am.  The staffing resources that had been ready to go this am have been reallocated to other needs after grandson refusal to accept d/t issues he stated.  According to [facility] rep. grandson was also under the understanding that discharge would not occur till Monday on a phone call with [facility] to confirm staffing made on 06/28/23 at 522 pm.   Update: On 06/28/23 LCSW CM note and TOC supervisor notes indicated patient will be a safe discharge after PCS are in place, all equipment delivered, all hospice related issues in place.   Updated provider and trying to reach out to [facility] liaison to update as well. 1000 06/29/23.  [facility] will have staffing ready for Monday 07/01/23.   Gabriel Cirri MSN RN CM  Transitions of Care Department [facility] [phone] Weekends Only     Expected Discharge Plan:  (TBD) Barriers to Discharge: Continued Medical Work up  Expected Discharge Plan and Services     Post Acute Care Choice: Hospice Living arrangements for the past 2 months: Single Family Home                                       Social Determinants of Health (SDOH) Interventions SDOH  Screenings   Food Insecurity: Patient Declined (06/25/2023)  Housing: Patient Declined (06/25/2023)  Transportation Needs: Unmet Transportation Needs (06/25/2023)  Utilities: Not At Risk (06/25/2023)  Depression (PHQ2-9): Medium Risk (03/06/2023)  Tobacco Use: Medium Risk (06/24/2023)    Readmission Risk Interventions     No data to display

## 2023-06-30 DIAGNOSIS — N390 Urinary tract infection, site not specified: Secondary | ICD-10-CM | POA: Diagnosis not present

## 2023-06-30 DIAGNOSIS — I959 Hypotension, unspecified: Secondary | ICD-10-CM | POA: Diagnosis not present

## 2023-06-30 DIAGNOSIS — R634 Abnormal weight loss: Secondary | ICD-10-CM | POA: Diagnosis not present

## 2023-06-30 DIAGNOSIS — Z950 Presence of cardiac pacemaker: Secondary | ICD-10-CM | POA: Diagnosis not present

## 2023-06-30 NOTE — Progress Notes (Signed)
PROGRESS NOTE    [name]  TKZ:[id number] DOB: 27-Aug-1933 DOA: 06/24/2023 PCP: Karie Schwalbe, MD  [id number]-AA  LOS: 5 days   Brief hospital course: [name] is a 89 y.o. male with medical history significant for anxiety, depression, A-fib on Eliquis, hypertension, history of sick sinus syndrome status post pacemaker placement, CAD s/p DES to LCx and OM 01/2023 at the [place], recently hospitalized from 8/24 to 06/07/2023 with generalized weakness/unintentional weight loss of undetermined etiology, seen by neurology with plans for outpatient EMG/NCS, and discharged to SNF, was sent to the ED for evaluation of ongoing weakness and failure to thrive.  Since his arrival to SNF, patient had not been eating.  Patient had a recent pansensitive Morganella UTI on 06/18/2023 and was treated with antibiotics but he has had no overall improvement.   9/20: Family decided to proceed with hospice care.  Plan is to go home with hospice, grandson does not want him back home until all the services has been established, TOC is working on it.  9/21: Medically stable, likely be going home with hospice after arranging all the services on Monday.  Assessment & Plan:  * Urinary tract infection, ruled out --ceftriaxone started on admission.  Urine cx returned neg growth. --d/c'ed ceftriaxone  Hypotension Suspect related to poor oral intake --s/p IVF with improvement  weight loss Poor oral intake Severe malnutrition in context of chronic illness  Failure to thrive --palliative consulted, and family requested hospice service  CAD (coronary artery disease)  Sick sinus syndrome (HCC) S/p pacemaker Atrial paced rhythm on EKG  PAF (paroxysmal atrial fibrillation) (HCC) Continue Eliquis  Hypokalemia --monitor and supplement PRN  Left heel stage 2, Right heel stage 2 and coccyx stage 2 pressure injuries, present on admission    DVT prophylaxis: [id number] Code Status: DNR  Family Communication:   Level of care: Med-Surg Dispo:   The patient is from: just discharged from rehab Anticipated d/c is to: home with hospice Anticipated d/c date is: whenever family is ready to take pt home   Subjective and Interval History:  Patient was sitting comfortably in his bed when seen today.  No new concern.  Objective: Vitals:   06/29/23 1613 06/29/23 1933 06/30/23 0406 06/30/23 0821  BP: (!) 101/51 (!) 92/49 (!) 101/56 112/61  Pulse: 62 60 (!) 59 69  Resp: 18 18 18 16   Temp: (!) 97.2 F (36.2 C) 98.3 F (36.8 C) 97.6 F (36.4 C) (!) 97.2 F (36.2 C)  TempSrc:  Oral Oral   SpO2: 100% 99% 100% 100%  Weight:        Intake/Output Summary (Last 24 hours) at 06/30/2023 1358 Last data filed at 06/30/2023 1100 Gross per 24 hour  Intake 300 ml  Output --  Net 300 ml   Filed Weights   06/26/23 0208 06/28/23 0410 06/29/23 0500  Weight: 56.3 kg 58.6 kg 59 kg    Examination:   General.  Frail and malnourished elderly man, in no acute distress. Pulmonary.  Lungs clear bilaterally, normal respiratory effort. CV.  Regular rate and rhythm, no JVD, rub or murmur. Abdomen.  Soft, nontender, nondistended, BS positive. CNS.  Alert and oriented .  No focal neurologic deficit. Extremities.  No edema, no cyanosis, pulses intact and symmetrical. Psychiatry.  Judgment and insight appears normal.   Data Reviewed: I have personally reviewed labs and imaging studies  Time spent: 38 minutes  This record has been created using Conservation officer, historic buildings. Errors have been  sought and corrected,but may not always be located. Such creation errors do not reflect on the standard of care.   Arnetha Courser, MD [facility] If 7PM-7AM, please contact night-coverage 06/30/2023, 1:58 PM

## 2023-06-30 NOTE — Plan of Care (Signed)

## 2023-07-01 DIAGNOSIS — R531 Weakness: Secondary | ICD-10-CM

## 2023-07-01 DIAGNOSIS — I959 Hypotension, unspecified: Secondary | ICD-10-CM | POA: Diagnosis not present

## 2023-07-01 DIAGNOSIS — R627 Adult failure to thrive: Secondary | ICD-10-CM | POA: Diagnosis not present

## 2023-07-01 DIAGNOSIS — L8990 Pressure ulcer of unspecified site, unspecified stage: Secondary | ICD-10-CM | POA: Insufficient documentation

## 2023-07-01 DIAGNOSIS — N390 Urinary tract infection, site not specified: Secondary | ICD-10-CM | POA: Diagnosis not present

## 2023-07-01 MED ORDER — APIXABAN 2.5 MG PO TABS: 2.5000 mg | ORAL_TABLET | Freq: Two times a day (BID) | ORAL | 1 refills | Status: DC

## 2023-07-01 MED ORDER — ENSURE ENLIVE PO LIQD: 237.0000 mL | Freq: Three times a day (TID) | ORAL | 12 refills | Status: DC

## 2023-07-01 NOTE — TOC Transition Note (Signed)
Transition of Care [facility]) - CM/SW Discharge Note   Patient Details  Name: [name] MRN: [id number] Date of Birth: 31-May-1933  Transition of Care [facility]) CM/SW Contact:  Darolyn Rua, LCSW Phone Number: 07/01/2023, 11:25 AM   Clinical Narrative:     Patient to discharge home with [facility] hospice and [facility] PCS services starting today, [facility] reports they were able to start Saturday yet grandson of patient called to cancel and wanted Monday dc.   EMS has been called to transport patient home. [facility] and [facility] informed, grandson aware.   Final next level of care: Home w Hospice Care Barriers to Discharge: No Barriers Identified   Patient Goals and CMS Choice CMS Medicare.gov Compare Post Acute Care list provided to:: Patient Choice offered to / list presented to : Patient  Discharge Placement                         Discharge Plan and Services Additional resources added to the After Visit Summary for       Post Acute Care Choice: Hospice                               Social Determinants of Health (SDOH) Interventions SDOH Screenings   Food Insecurity: Patient Declined (06/25/2023)  Housing: Patient Declined (06/25/2023)  Transportation Needs: Unmet Transportation Needs (06/25/2023)  Utilities: Not At Risk (06/25/2023)  Depression (PHQ2-9): Medium Risk (03/06/2023)  Tobacco Use: Medium Risk (06/24/2023)     Readmission Risk Interventions     No data to display

## 2023-07-01 NOTE — Discharge Summary (Signed)
Physician Discharge Summary   Patient: [name] MRN: [id number] DOB: May 12, 1933  Admit date:     06/24/2023  Discharge date: 07/01/23  Discharge Physician: Arnetha Courser   PCP: Karie Schwalbe, MD   Recommendations at discharge:  Patient is being discharged home with hospice Follow-up with primary care provider if needed  Discharge Diagnoses: Principal Problem:   Urinary tract infection Active Problems:   Hypotension   Essential hypertension, benign   Unintentional weight loss   PAF (paroxysmal atrial fibrillation) (HCC)   Sick sinus syndrome (HCC)   Generalized weakness   Cardiac pacemaker in situ   CAD (coronary artery disease)   FTT (failure to thrive) in adult   Protein-calorie malnutrition, severe   Pressure injury of skin   Hospital Course: [name] is a 89 y.o. male with medical history significant for anxiety, depression, A-fib on Eliquis, hypertension, history of sick sinus syndrome status post pacemaker placement, CAD s/p DES to LCx and OM 01/2023 at the [place], recently hospitalized from 8/24 to 06/07/2023 with generalized weakness/unintentional weight loss of undetermined etiology, seen by neurology with plans for outpatient EMG/NCS, and discharged to SNF, was sent to the ED for evaluation of ongoing weakness and failure to thrive.  Since his arrival to SNF, patient had not been eating.  Patient had a recent pansensitive Morganella UTI on 06/18/2023 and was treated with antibiotics but he has had no overall improvement.    9/20: Family decided to proceed with hospice care.  Plan is to go home with hospice.  Family took some time to prepare the home and likely be going back home on Monday after arranging all the services.  9/23: Patient remained medically stable to go home with hospice.  No concern.  Hospice equipment has been delivered over the weekend.  For further need he will follow-up with hospice physician or with his PCP.  Assessment and Plan: * Urinary  tract infection Recently treated for Morganella UTI seen on culture from 9/10 Repeat blood cultures negative so antibiotics were discontinued  Hypotension History of essential hypertension Suspect related to poor oral intake Not meeting sepsis criteria Hold home antihypertensives IV hydration  Unintentional weight loss Failure to thrive/frailty/physical deconditioning Patient was evaluated by neurology during his recent hospitalization.  EMG recommended but could not get it because of pacemaker PT eval Consider palliative care consult-family decided to take him back home with hospice Nutritionist consult  CAD (coronary artery disease) Continue atorvastatin and apixaban  Sick sinus syndrome (HCC) S/p pacemaker Atrial paced rhythm on EKG  PAF (paroxysmal atrial fibrillation) (HCC) Continue Eliquis         Consultants: Palliative care Procedures performed: None Disposition: Hospice care Diet recommendation:  Discharge Diet Orders (From admission, onward)     Start     Ordered   07/01/23 0000  Diet - low sodium heart healthy        07/01/23 1008           Dysphagia 1 with thin liquid DISCHARGE MEDICATION: Allergies as of 07/01/2023       Reactions   Augmentin [amoxicillin-pot Clavulanate] Rash   Colchicine Rash   Morphine And Codeine    Shaking, back pain.        Medication List     STOP taking these medications    atorvastatin 40 MG tablet Commonly known as: LIPITOR   ciprofloxacin 500 MG tablet Commonly known as: CIPRO   clopidogrel 75 MG tablet Commonly known as: PLAVIX  TAKE these medications    acetaminophen 325 MG tablet Commonly known as: TYLENOL Take 650 mg by mouth every 6 (six) hours as needed. Reported on 01/17/2016   apixaban 2.5 MG Tabs tablet Commonly known as: ELIQUIS Take 1 tablet (2.5 mg total) by mouth 2 (two) times daily. What changed:  medication strength how much to take   ascorbic acid 500 MG  tablet Commonly known as: VITAMIN C Take 500 mg by mouth 2 (two) times daily.   bisacodyl 5 MG EC tablet Commonly known as: DULCOLAX Take 2 tablets (10 mg total) by mouth 2 (two) times daily.   bisacodyl 10 MG suppository Commonly known as: DULCOLAX Place 1 suppository (10 mg total) rectally daily as needed for severe constipation.   feeding supplement Liqd Take 237 mLs by mouth 3 (three) times daily between meals.   finasteride 5 MG tablet Commonly known as: PROSCAR Take 1 tablet (5 mg total) by mouth daily.   folic acid 1 MG tablet Commonly known as: FOLVITE Take 1 tablet (1 mg total) by mouth daily.   iron polysaccharides 150 MG capsule Commonly known as: NIFEREX Take 1 capsule (150 mg total) by mouth daily.   nystatin 100000 UNIT/ML suspension Commonly known as: MYCOSTATIN Take 5 mLs by mouth 4 (four) times daily.   ondansetron 4 MG tablet Commonly known as: ZOFRAN Take 4 mg by mouth every 8 (eight) hours as needed for nausea or vomiting.   pantoprazole 40 MG tablet Commonly known as: PROTONIX Take 1 tablet (40 mg total) by mouth daily.   polyethylene glycol 17 g packet Commonly known as: MIRALAX / GLYCOLAX Take 17 g by mouth 2 (two) times daily. Skip the dose if no constipation   sertraline 50 MG tablet Commonly known as: ZOLOFT Take 50 mg by mouth daily.   tamsulosin 0.4 MG Caps capsule Commonly known as: FLOMAX TAKE ONE CAPSULE BY MOUTH EVERY DAY What changed: when to take this   zinc sulfate 220 (50 Zn) MG capsule Take 1 capsule (220 mg total) by mouth daily.               Discharge Care Instructions  (From admission, onward)           Start     Ordered   07/01/23 0000  Discharge wound care:       Comments: Apply gel dressing to pressure injuries   07/01/23 1008            Follow-up Information     Karie Schwalbe, MD Follow up.   Specialties: Internal Medicine, Pediatrics Contact information: [address] [place] [address] [phone]                Discharge Exam: [name] Weights   06/28/23 0410 06/29/23 0500 06/30/23 0714  Weight: 58.6 kg 59 kg 59 kg   General.  Frail and malnourished elderly man, in no acute distress. Pulmonary.  Lungs clear bilaterally, normal respiratory effort. CV.  Regular rate and rhythm, no JVD, rub or murmur. Abdomen.  Soft, nontender, nondistended, BS positive. CNS.  Alert and oriented x 2.  No focal neurologic deficit. Extremities.  No edema, no cyanosis, pulses intact and symmetrical. Psychiatry.  Judgment and insight appears impaired  Condition at discharge: stable  The results of significant diagnostics from this hospitalization (including imaging, microbiology, ancillary and laboratory) are listed below for reference.   Imaging Studies: CT CHEST ABDOMEN PELVIS W CONTRAST  Addendum Date: 06/03/2023   ADDENDUM  REPORT: 06/03/2023 19:59 ADDENDUM: Additional history obtained: Generalized weakness and fatigue for 6 months. Chest pain and difficulty breathing for couple of days. Electronically Signed   By: Burman Nieves M.D.   On: 06/03/2023 19:59   Result Date: 06/03/2023 CLINICAL DATA:  Occult malignancy. EXAM: CT CHEST, ABDOMEN, AND PELVIS WITH CONTRAST TECHNIQUE: Multidetector CT imaging of the chest, abdomen and pelvis was performed following the standard protocol during bolus administration of intravenous contrast. RADIATION DOSE REDUCTION: This exam was performed according to the departmental dose-optimization program which includes automated exposure control, adjustment of the mA and/or kV according to patient size and/or use of iterative reconstruction technique. CONTRAST:  OMNIPAQUE IOHEXOL 300 MG/ML  SOLN COMPARISON:  Chest radiograph 06/01/2023. CT abdomen and pelvis 11/01/2018 FINDINGS: CT CHEST FINDINGS Cardiovascular: Cardiac enlargement. No pericardial effusions. Cardiac pacemaker. Normal caliber thoracic aorta. No dissection.  Calcification of the aorta and coronary arteries. Prominent hemi azygous vein. Mediastinum/Nodes: Thyroid gland is unremarkable. Esophagus is decompressed. No significant lymphadenopathy. Lungs/Pleura: Small bilateral pleural effusions with basilar atelectasis. Diffuse interstitial pattern to the lungs, mostly peripheral. This could represent acute edema or chronic fibrosis. There is a ground-glass nodule in the left apex measuring 1 cm diameter. This could represent neoplasm or inflammatory process. Musculoskeletal: Degenerative changes in the spine. No destructive bone lesions. Intramuscular lipoma in the right shoulder. CT ABDOMEN PELVIS FINDINGS Hepatobiliary: Multiple circumscribed low-attenuation lesions in the liver. Largest is in segment 4 measuring 4.6 cm diameter. No complicating features are indicated. No change since prior study. This is consistent with simple cysts. No imaging follow-up is indicated. Gallbladder is surgically absent. No bile duct dilatation. Pancreas: Unremarkable. No pancreatic ductal dilatation or surrounding inflammatory changes. Spleen: Normal in size without focal abnormality. Adrenals/Urinary Tract: Adrenal glands are unremarkable. Kidneys are normal, without renal calculi, focal lesion, or hydronephrosis. Bladder is unremarkable. Stomach/Bowel: Stomach, small bowel, and colon are not abnormally distended. No wall thickening or inflammatory changes. Stool fills the colon suggesting possible constipation. Scattered colonic diverticula without evidence of acute diverticulitis. Sigmoid colonic anastomosis. Appendix is not identified. Vascular/Lymphatic: Aortic atherosclerosis. No enlarged abdominal or pelvic lymph nodes. Reproductive: Prostate gland is enlarged. Other: Small amount of free fluid in the upper abdomen along the pericolic gutters. No free air. Minimal ventral abdominal wall hernia containing fat. Musculoskeletal: Degenerative changes in the spine. Degenerative changes  in the hips. Lucent and sclerotic changes in the L1 vertebra likely representing vertebral hemangioma. Small focal areas of sclerosis in the pelvis are nonspecific but likely benign bone islands. No change since previous study. Fusion of the upper SI joints. IMPRESSION: 1. Indeterminate 1 cm diameter ground-glass nodule in the left lung apex. Neoplasm is not excluded. Initial follow-up with CT at 6 months is recommended to confirm persistence. If persistent, repeat CT is recommended every 2 years until 5 years of stability has been established. This recommendation follows the consensus statement: Guidelines for Management of Incidental Pulmonary Nodules Detected on CT Images: From the Fleischner Society 2017; Radiology 2017; 284:228-243. 2. Diffuse interstitial pattern to the lungs most prominent in the periphery, likely edema. Small bilateral pleural effusions. 3. Cardiac enlargement. 4. Aortic atherosclerosis. 5. Small abdominal ascites. 6. Prostate is enlarged. Electronically Signed: By: Burman Nieves M.D. On: 06/03/2023 19:43   CT CERVICAL SPINE WO CONTRAST  Result Date: 06/03/2023 CLINICAL DATA:  Ataxia, nontraumatic. EXAM: CT CERVICAL SPINE WITHOUT CONTRAST CT THORACIC AND LUMBAR SPINE WITH CONTRAST TECHNIQUE: Multidetector CT imaging of the cervical spine was performed without intravenous contrast. Multiplanar  CT image reconstructions were also generated. Multiplanar CT images of the thoracic and lumbar spine were reconstructed from contemporary CT of the Chest, Abdomen, and Pelvis. RADIATION DOSE REDUCTION: This exam was performed according to the departmental dose-optimization program which includes automated exposure control, adjustment of the mA and/or kV according to patient size and/or use of iterative reconstruction technique. COMPARISON:  CT abdomen/pelvis 11/01/2018, two-view chest radiograph 09/30/2019 FINDINGS: CT CERVICAL SPINE FINDINGS Alignment: There is trace anterolisthesis of C4 on C5.  There is no evidence of traumatic malalignment. Skull base and vertebrae: Skull base alignment is maintained. Vertebral body heights are preserved, without evidence of acute fracture. There is no suspicious osseous lesion. Soft tissues and spinal canal: No prevertebral fluid or swelling. No visible canal hematoma. Disc levels: There is moderate to severe disc space narrowing at C5-C6 through C7-T1. C1-C2: There is advanced degenerative change of the atlantodental articulation with mild pannus but no narrowing of the craniocervical junction. C2-C3: There is moderate bilateral facet arthropathy without significant spinal canal or neural foraminal stenosis. C3-C4: There is mild bilateral uncovertebral ridging and moderate bilateral facet arthropathy resulting in mild-to-moderate bilateral neural foraminal stenosis without significant spinal canal stenosis C4-C5: There is trace anterolisthesis with bilateral uncovertebral ridging and moderate to severe bilateral facet arthropathy resulting in moderate to severe bilateral neural foraminal stenosis without significant spinal canal stenosis C5-C6: There is partial osseous fusion across the disc space with bilateral uncovertebral ridging and mild facet arthropathy resulting in moderate right and no significant left neural foraminal stenosis and no significant spinal canal stenosis C6-C7: Bilateral uncovertebral ridging resulting in mild bilateral neural foraminal stenosis without significant spinal canal stenosis C7-T1: Mild bilateral uncovertebral ridging and facet arthropathy resulting in mild left and no significant right neural foraminal stenosis and no significant spinal canal stenosis. Upper chest: Assessed on the separately dictated CT chest. CT THORACIC SPINE FINDINGS Alignment: There is mild thoracic dextrocurvature. There is no antero or retrolisthesis. Vertebrae: There is a 1.2 cm lucent lesion in the T11 vertebral body and a smaller lucent lesion in the T10  vertebral body, both of which are unchanged since 2015 consistent with benign lesions, likely hemangiomas. There are no other focal lesions. Vertebral body heights are preserved, without evidence of acute fracture. Paraspinal and other soft tissues: The paraspinal soft tissues are unremarkable. The chest is assessed on the separately dictated CT chest. Disc levels: The disc spaces are preserved. There is mild degenerative endplate change anteriorly in the midthoracic spine most notably at T8-T9. There is overall mild multilevel facet arthropathy. There is no significant disc herniation by CT. There is no significant osseous spinal canal or neural foraminal stenosis. CT LUMBAR SPINE FINDINGS Segmentation: Standard; the lowest formed disc space is designated L5-S1. Alignment: There is mild lumbar levocurvature centered at L3. There is no antero or retrolisthesis. Vertebrae: The large hemangioma occupying essentially the entirety of the L1 vertebral body is unchanged going back to 2015. There are no other suspicious osseous lesions. Vertebral body heights are preserved, without evidence of acute fracture. Paraspinal and other soft tissues: The paraspinal soft tissues are unremarkable. The abdominal and pelvic viscera are assessed on the separately dictated CT abdomen/pelvis. Disc levels: There is multilevel moderate to advanced disc space narrowing with vacuum disc phenomenon throughout the lumbar spine. T12-L1: No significant spinal canal or neural foraminal stenosis L1-L2: There is mild-to-moderate bilateral facet arthropathy without high-grade spinal canal or neural foraminal stenosis L2-L3: There is a disc bulge, mild endplate spurring, and moderate bilateral  facet arthropathy resulting in mild left worse than right neural foraminal stenosis without significant spinal canal stenosis L3-L4: There is a disc bulge, endplate spurring, and mild bilateral facet arthropathy resulting in probable mild spinal canal stenosis  and mild bilateral neural foraminal stenosis L4-L5: Manson Passey there is a disc bulge, endplate spurring, and moderate to advanced right worse than left facet arthropathy resulting in moderate left and mild right neural foraminal stenosis and probable mild spinal canal stenosis L5-S1: There is right worse than left endplate spurring and moderate bilateral facet arthropathy resulting in moderate left and no significant right neural foraminal stenosis and no significant spinal canal stenosis. IMPRESSION: 1. No acute finding in the cervical, thoracic, or lumbar spine. 2. Multilevel uncovertebral ridging and facet arthropathy throughout the cervical spine as detailed above resulting in up to moderate to severe bilateral neural foraminal stenosis C4-C5. No high-grade spinal canal stenosis at any level in the cervical spine. 3. Mild degenerative changes in the thoracic spine without high-grade spinal canal or neural foraminal stenosis. 4. Multilevel disc and facet degeneration throughout the lumbar spine as detailed above resulting in up to moderate left neural foraminal stenosis at L4-L5 and L5-S1. No high-grade spinal canal stenosis at any level. Electronically Signed   By: Lesia Hausen M.D.   On: 06/03/2023 19:55   CT T-SPINE NO CHARGE  Result Date: 06/03/2023 CLINICAL DATA:  Ataxia, nontraumatic. EXAM: CT CERVICAL SPINE WITHOUT CONTRAST CT THORACIC AND LUMBAR SPINE WITH CONTRAST TECHNIQUE: Multidetector CT imaging of the cervical spine was performed without intravenous contrast. Multiplanar CT image reconstructions were also generated. Multiplanar CT images of the thoracic and lumbar spine were reconstructed from contemporary CT of the Chest, Abdomen, and Pelvis. RADIATION DOSE REDUCTION: This exam was performed according to the departmental dose-optimization program which includes automated exposure control, adjustment of the mA and/or kV according to patient size and/or use of iterative reconstruction technique.  COMPARISON:  CT abdomen/pelvis 11/01/2018, two-view chest radiograph 09/30/2019 FINDINGS: CT CERVICAL SPINE FINDINGS Alignment: There is trace anterolisthesis of C4 on C5. There is no evidence of traumatic malalignment. Skull base and vertebrae: Skull base alignment is maintained. Vertebral body heights are preserved, without evidence of acute fracture. There is no suspicious osseous lesion. Soft tissues and spinal canal: No prevertebral fluid or swelling. No visible canal hematoma. Disc levels: There is moderate to severe disc space narrowing at C5-C6 through C7-T1. C1-C2: There is advanced degenerative change of the atlantodental articulation with mild pannus but no narrowing of the craniocervical junction. C2-C3: There is moderate bilateral facet arthropathy without significant spinal canal or neural foraminal stenosis. C3-C4: There is mild bilateral uncovertebral ridging and moderate bilateral facet arthropathy resulting in mild-to-moderate bilateral neural foraminal stenosis without significant spinal canal stenosis C4-C5: There is trace anterolisthesis with bilateral uncovertebral ridging and moderate to severe bilateral facet arthropathy resulting in moderate to severe bilateral neural foraminal stenosis without significant spinal canal stenosis C5-C6: There is partial osseous fusion across the disc space with bilateral uncovertebral ridging and mild facet arthropathy resulting in moderate right and no significant left neural foraminal stenosis and no significant spinal canal stenosis C6-C7: Bilateral uncovertebral ridging resulting in mild bilateral neural foraminal stenosis without significant spinal canal stenosis C7-T1: Mild bilateral uncovertebral ridging and facet arthropathy resulting in mild left and no significant right neural foraminal stenosis and no significant spinal canal stenosis. Upper chest: Assessed on the separately dictated CT chest. CT THORACIC SPINE FINDINGS Alignment: There is mild  thoracic dextrocurvature. There is no antero or  retrolisthesis. Vertebrae: There is a 1.2 cm lucent lesion in the T11 vertebral body and a smaller lucent lesion in the T10 vertebral body, both of which are unchanged since 2015 consistent with benign lesions, likely hemangiomas. There are no other focal lesions. Vertebral body heights are preserved, without evidence of acute fracture. Paraspinal and other soft tissues: The paraspinal soft tissues are unremarkable. The chest is assessed on the separately dictated CT chest. Disc levels: The disc spaces are preserved. There is mild degenerative endplate change anteriorly in the midthoracic spine most notably at T8-T9. There is overall mild multilevel facet arthropathy. There is no significant disc herniation by CT. There is no significant osseous spinal canal or neural foraminal stenosis. CT LUMBAR SPINE FINDINGS Segmentation: Standard; the lowest formed disc space is designated L5-S1. Alignment: There is mild lumbar levocurvature centered at L3. There is no antero or retrolisthesis. Vertebrae: The large hemangioma occupying essentially the entirety of the L1 vertebral body is unchanged going back to 2015. There are no other suspicious osseous lesions. Vertebral body heights are preserved, without evidence of acute fracture. Paraspinal and other soft tissues: The paraspinal soft tissues are unremarkable. The abdominal and pelvic viscera are assessed on the separately dictated CT abdomen/pelvis. Disc levels: There is multilevel moderate to advanced disc space narrowing with vacuum disc phenomenon throughout the lumbar spine. T12-L1: No significant spinal canal or neural foraminal stenosis L1-L2: There is mild-to-moderate bilateral facet arthropathy without high-grade spinal canal or neural foraminal stenosis L2-L3: There is a disc bulge, mild endplate spurring, and moderate bilateral facet arthropathy resulting in mild left worse than right neural foraminal stenosis  without significant spinal canal stenosis L3-L4: There is a disc bulge, endplate spurring, and mild bilateral facet arthropathy resulting in probable mild spinal canal stenosis and mild bilateral neural foraminal stenosis L4-L5: Manson Passey there is a disc bulge, endplate spurring, and moderate to advanced right worse than left facet arthropathy resulting in moderate left and mild right neural foraminal stenosis and probable mild spinal canal stenosis L5-S1: There is right worse than left endplate spurring and moderate bilateral facet arthropathy resulting in moderate left and no significant right neural foraminal stenosis and no significant spinal canal stenosis. IMPRESSION: 1. No acute finding in the cervical, thoracic, or lumbar spine. 2. Multilevel uncovertebral ridging and facet arthropathy throughout the cervical spine as detailed above resulting in up to moderate to severe bilateral neural foraminal stenosis C4-C5. No high-grade spinal canal stenosis at any level in the cervical spine. 3. Mild degenerative changes in the thoracic spine without high-grade spinal canal or neural foraminal stenosis. 4. Multilevel disc and facet degeneration throughout the lumbar spine as detailed above resulting in up to moderate left neural foraminal stenosis at L4-L5 and L5-S1. No high-grade spinal canal stenosis at any level. Electronically Signed   By: Lesia Hausen M.D.   On: 06/03/2023 19:55   CT L-SPINE NO CHARGE  Result Date: 06/03/2023 CLINICAL DATA:  Ataxia, nontraumatic. EXAM: CT CERVICAL SPINE WITHOUT CONTRAST CT THORACIC AND LUMBAR SPINE WITH CONTRAST TECHNIQUE: Multidetector CT imaging of the cervical spine was performed without intravenous contrast. Multiplanar CT image reconstructions were also generated. Multiplanar CT images of the thoracic and lumbar spine were reconstructed from contemporary CT of the Chest, Abdomen, and Pelvis. RADIATION DOSE REDUCTION: This exam was performed according to the departmental  dose-optimization program which includes automated exposure control, adjustment of the mA and/or kV according to patient size and/or use of iterative reconstruction technique. COMPARISON:  CT abdomen/pelvis 11/01/2018, two-view chest  radiograph 09/30/2019 FINDINGS: CT CERVICAL SPINE FINDINGS Alignment: There is trace anterolisthesis of C4 on C5. There is no evidence of traumatic malalignment. Skull base and vertebrae: Skull base alignment is maintained. Vertebral body heights are preserved, without evidence of acute fracture. There is no suspicious osseous lesion. Soft tissues and spinal canal: No prevertebral fluid or swelling. No visible canal hematoma. Disc levels: There is moderate to severe disc space narrowing at C5-C6 through C7-T1. C1-C2: There is advanced degenerative change of the atlantodental articulation with mild pannus but no narrowing of the craniocervical junction. C2-C3: There is moderate bilateral facet arthropathy without significant spinal canal or neural foraminal stenosis. C3-C4: There is mild bilateral uncovertebral ridging and moderate bilateral facet arthropathy resulting in mild-to-moderate bilateral neural foraminal stenosis without significant spinal canal stenosis C4-C5: There is trace anterolisthesis with bilateral uncovertebral ridging and moderate to severe bilateral facet arthropathy resulting in moderate to severe bilateral neural foraminal stenosis without significant spinal canal stenosis C5-C6: There is partial osseous fusion across the disc space with bilateral uncovertebral ridging and mild facet arthropathy resulting in moderate right and no significant left neural foraminal stenosis and no significant spinal canal stenosis C6-C7: Bilateral uncovertebral ridging resulting in mild bilateral neural foraminal stenosis without significant spinal canal stenosis C7-T1: Mild bilateral uncovertebral ridging and facet arthropathy resulting in mild left and no significant right neural  foraminal stenosis and no significant spinal canal stenosis. Upper chest: Assessed on the separately dictated CT chest. CT THORACIC SPINE FINDINGS Alignment: There is mild thoracic dextrocurvature. There is no antero or retrolisthesis. Vertebrae: There is a 1.2 cm lucent lesion in the T11 vertebral body and a smaller lucent lesion in the T10 vertebral body, both of which are unchanged since 2015 consistent with benign lesions, likely hemangiomas. There are no other focal lesions. Vertebral body heights are preserved, without evidence of acute fracture. Paraspinal and other soft tissues: The paraspinal soft tissues are unremarkable. The chest is assessed on the separately dictated CT chest. Disc levels: The disc spaces are preserved. There is mild degenerative endplate change anteriorly in the midthoracic spine most notably at T8-T9. There is overall mild multilevel facet arthropathy. There is no significant disc herniation by CT. There is no significant osseous spinal canal or neural foraminal stenosis. CT LUMBAR SPINE FINDINGS Segmentation: Standard; the lowest formed disc space is designated L5-S1. Alignment: There is mild lumbar levocurvature centered at L3. There is no antero or retrolisthesis. Vertebrae: The large hemangioma occupying essentially the entirety of the L1 vertebral body is unchanged going back to 2015. There are no other suspicious osseous lesions. Vertebral body heights are preserved, without evidence of acute fracture. Paraspinal and other soft tissues: The paraspinal soft tissues are unremarkable. The abdominal and pelvic viscera are assessed on the separately dictated CT abdomen/pelvis. Disc levels: There is multilevel moderate to advanced disc space narrowing with vacuum disc phenomenon throughout the lumbar spine. T12-L1: No significant spinal canal or neural foraminal stenosis L1-L2: There is mild-to-moderate bilateral facet arthropathy without high-grade spinal canal or neural foraminal  stenosis L2-L3: There is a disc bulge, mild endplate spurring, and moderate bilateral facet arthropathy resulting in mild left worse than right neural foraminal stenosis without significant spinal canal stenosis L3-L4: There is a disc bulge, endplate spurring, and mild bilateral facet arthropathy resulting in probable mild spinal canal stenosis and mild bilateral neural foraminal stenosis L4-L5: Manson Passey there is a disc bulge, endplate spurring, and moderate to advanced right worse than left facet arthropathy resulting in moderate left and  mild right neural foraminal stenosis and probable mild spinal canal stenosis L5-S1: There is right worse than left endplate spurring and moderate bilateral facet arthropathy resulting in moderate left and no significant right neural foraminal stenosis and no significant spinal canal stenosis. IMPRESSION: 1. No acute finding in the cervical, thoracic, or lumbar spine. 2. Multilevel uncovertebral ridging and facet arthropathy throughout the cervical spine as detailed above resulting in up to moderate to severe bilateral neural foraminal stenosis C4-C5. No high-grade spinal canal stenosis at any level in the cervical spine. 3. Mild degenerative changes in the thoracic spine without high-grade spinal canal or neural foraminal stenosis. 4. Multilevel disc and facet degeneration throughout the lumbar spine as detailed above resulting in up to moderate left neural foraminal stenosis at L4-L5 and L5-S1. No high-grade spinal canal stenosis at any level. Electronically Signed   By: Lesia Hausen M.D.   On: 06/03/2023 19:55   ECHOCARDIOGRAM COMPLETE  Result Date: 06/02/2023    ECHOCARDIOGRAM REPORT   Patient Name:   SANTHIAGO PANEK Date of Exam: 06/02/2023 Medical Rec #:  [id number]     Height:       70.0 in Accession #:    [id number]    Weight:       168.4 lb Date of Birth:  [birth year]      BSA:          1.940 m Patient Age:    89 years      BP:           112/59 mmHg Patient Gender: M              HR:           60 bpm. Exam Location:  [facility] Procedure: 2D Echo and Strain Analysis Indications:     Dyspnea R06.00                  Elevated Troponin  History:         Patient has no prior history of Echocardiogram examinations.  Sonographer:     Overton Mam RDCS, FASE Referring Phys:  [id number] AMY N COX Diagnosing Phys: Dietrich Pates MD  Sonographer Comments: Global longitudinal strain was attempted. IMPRESSIONS  1. Left ventricular ejection fraction, by estimation, is 60 to 65%. The left ventricle has normal function. The left ventricle has no regional wall motion abnormalities. There is moderate left ventricular hypertrophy. Left ventricular diastolic parameters are indeterminate.  2. Right ventricular systolic function is normal. The right ventricular size is normal.  3. Mild mitral valve regurgitation.  4. The aortic valve is tricuspid. Aortic valve regurgitation is mild. Aortic valve sclerosis/calcification is present, without any evidence of aortic stenosis.  5. Pulmonic valve regurgitation is moderate. FINDINGS  Left Ventricle: Left ventricular ejection fraction, by estimation, is 60 to 65%. The left ventricle has normal function. The left ventricle has no regional wall motion abnormalities. Global longitudinal strain performed but not reported based on interpreter judgement due to suboptimal tracking. The global longitudinal strain is normal despite suboptimal segment tracking. The left ventricular internal cavity size was normal in size. There is moderate left ventricular hypertrophy. Left ventricular  diastolic parameters are indeterminate. Right Ventricle: The right ventricular size is normal. Right vetricular wall thickness was not assessed. Right ventricular systolic function is normal. Left Atrium: Left atrial size was normal in size. Right Atrium: Right atrial size was normal in size. Pericardium: Trivial pericardial effusion is present. Mitral Valve: There is mild thickening of the mitral valve  leaflet(s). Mild mitral valve regurgitation. MV peak gradient, 101.6 mmHg. The mean mitral valve gradient is 54.0 mmHg. Tricuspid Valve: The tricuspid valve is normal in structure. Tricuspid valve regurgitation is mild. Aortic Valve: The aortic valve is tricuspid. Aortic valve regurgitation is mild. Aortic regurgitation PHT measures 648 msec. Aortic valve sclerosis/calcification is present, without any evidence of aortic stenosis. Aortic valve mean gradient measures 12.0 mmHg. Aortic valve peak gradient measures 25.2 mmHg. Aortic valve area, by VTI measures 1.79 cm. Pulmonic Valve: The pulmonic valve was normal in structure. Pulmonic valve regurgitation is moderate. Aorta: The aortic root and ascending aorta are structurally normal, with no evidence of dilitation. IAS/Shunts: No atrial level shunt detected by color flow Doppler. Additional Comments: A device lead is visualized.  LEFT VENTRICLE PLAX 2D LVIDd:         4.30 cm   Diastology LVIDs:         3.00 cm   LV e' medial:    6.09 cm/s LV PW:         1.30 cm   LV E/e' medial:  16.1 LV IVS:        1.60 cm   LV e' lateral:   6.96 cm/s LVOT diam:     1.90 cm   LV E/e' lateral: 14.1 LV SV:         70 LV SV Index:   36 LVOT Area:     2.84 cm  RIGHT VENTRICLE RV Basal diam:  3.40 cm RV S prime:     12.80 cm/s TAPSE (M-mode): 1.9 cm LEFT ATRIUM           Index        RIGHT ATRIUM           Index LA diam:      4.00 cm 2.06 cm/m   RA Area:     17.00 cm LA Vol (A4C): 62.5 ml 32.21 ml/m  RA Volume:   45.00 ml  23.19 ml/m  AORTIC VALVE                     PULMONIC VALVE AV Area (Vmax):    1.54 cm      PV Vmax:       1.28 m/s AV Area (Vmean):   1.75 cm      PV Peak grad:  6.6 mmHg AV Area (VTI):     1.79 cm AV Vmax:           251.00 cm/s AV Vmean:          156.667 cm/s AV VTI:            0.393 m AV Peak Grad:      25.2 mmHg AV Mean Grad:      12.0 mmHg LVOT Vmax:         136.00 cm/s LVOT Vmean:        96.700 cm/s LVOT VTI:          0.248 m LVOT/AV VTI ratio: 0.63 AI  PHT:            648 msec  AORTA Ao Root diam: 3.40 cm Ao Asc diam:  3.10 cm MITRAL VALVE               TRICUSPID VALVE MV Area (PHT): 2.87 cm    TR Peak grad:   33.6 mmHg MV Area VTI:   0.58 cm    TR Vmax:        290.00 cm/s  MV Peak grad:  101.6 mmHg MV Mean grad:  54.0 mmHg   SHUNTS MV Vmax:       5.04 m/s    Systemic VTI:  0.25 m MV Vmean:      331.0 cm/s  Systemic Diam: 1.90 cm MV Decel Time: 264 msec MR PISA:        1.01 cm MR PISA Radius: 0.40 cm MV E velocity: 98.00 cm/s MV A velocity: 27.80 cm/s MV E/A ratio:  3.53 Dietrich Pates MD Electronically signed by Dietrich Pates MD Signature Date/Time: 06/02/2023/9:40:49 PM    Final    CT Head Wo Contrast  Result Date: 06/01/2023 CLINICAL DATA:  Neuro deficit, acute, stroke suspected. Patient reports progressive upper and lower extremity weakness over the last 2-3 weeks. Patient brought by family for progressive deterioration. EXAM: CT HEAD WITHOUT CONTRAST TECHNIQUE: Contiguous axial images were obtained from the base of the skull through the vertex without intravenous contrast. RADIATION DOSE REDUCTION: This exam was performed according to the departmental dose-optimization program which includes automated exposure control, adjustment of the mA and/or kV according to patient size and/or use of iterative reconstruction technique. COMPARISON:  None Available. FINDINGS: Brain: Mild atrophy and white matter changes are within normal limits for age. Deep brain nuclei are within normal limits. The ventricles are of normal size. No significant extraaxial fluid collection is present. The brainstem and cerebellum are within normal limits. Midline structures are within normal limits. Vascular: Atherosclerotic calcifications are present within the cavernous internal carotid arteries bilaterally. No hyperdense vessel is present. Skull: Calvarium is intact. No focal lytic or blastic lesions are present. No significant extracranial soft tissue lesion is present. Sinuses/Orbits:  The paranasal sinuses and mastoid air cells are clear. Bilateral lens replacements are noted. Globes and orbits are otherwise unremarkable. Other: IMPRESSION: 1. No acute or focal lesion to explain the patient's symptoms. 2. Normal CT appearance of the brain for age. Electronically Signed   By: Marin Roberts M.D.   On: 06/01/2023 16:57   DG Chest Port 1 View  Result Date: 06/01/2023 CLINICAL DATA:  Weakness EXAM: PORTABLE CHEST 1 VIEW COMPARISON:  X-ray 09/30/2019 FINDINGS: Hyperinflation. Normal cardiopericardial silhouette. Calcified aorta. Minimal linear changes at the bases, likely scar or atelectasis. No pneumothorax or edema. Left upper chest pacemaker with leads along the right side of the heart. Degenerative changes of the spine and shoulders. IMPRESSION: Hyperinflation.  Minimal basilar scar or atelectasis.  Pacemaker Electronically Signed   By: Karen Kays M.D.   On: 06/01/2023 15:16    Microbiology: Results for orders placed or performed during the hospital encounter of 06/24/23  Urine Culture     Status: None   Collection Time: 06/25/23  1:45 AM   Specimen: Urine, Clean Catch  Result Value Ref Range Status   Specimen Description   Final    URINE, CLEAN CATCH Performed at [facility], [address], [place], [place] [address]    Special Requests   Final    NONE Performed at [facility], [address] [place], [place] [address]    Culture   Final    NO GROWTH Performed at [facility], [address] [place], [place] [address]    Report Status 06/26/2023 FINAL  Final    Labs: CBC: Recent Labs  Lab 06/24/23 1810 06/25/23 0736  WBC 6.2 5.6  HGB 13.9 13.5  HCT 41.2 39.8  MCV 94.9 93.6  PLT 159 131*   Basic Metabolic Panel: Recent Labs  Lab 06/24/23 1810 06/25/23 0736  NA 138 141  K 3.5 3.3*  CL 103 105  CO2 29 27  GLUCOSE 109* 104*  BUN 26* 21  CREATININE 0.57* 0.50*  CALCIUM 8.6* 8.5*   Liver Function Tests: Recent  Labs  Lab 06/24/23 1810  AST 39  ALT 57*  ALKPHOS 90  BILITOT 0.9  PROT 6.0*  ALBUMIN 2.6*   CBG: Recent Labs  Lab 06/25/23 2134 06/26/23 0808 06/26/23 1147 06/26/23 1650 06/26/23 1945  GLUCAP 88 96 103* 78 86    Discharge time spent: greater than 30 minutes.  This record has been created using Conservation officer, historic buildings. Errors have been sought and corrected,but may not always be located. Such creation errors do not reflect on the standard of care.   Signed: Arnetha Courser, MD [facility] 07/01/2023

## 2023-07-03 DIAGNOSIS — E1165 Type 2 diabetes mellitus with hyperglycemia: Secondary | ICD-10-CM | POA: Diagnosis not present

## 2023-07-10 ENCOUNTER — Encounter: Payer: Self-pay | Admitting: Internal Medicine

## 2023-07-10 ENCOUNTER — Ambulatory Visit: Payer: No Typology Code available for payment source | Admitting: Internal Medicine

## 2023-07-10 VITALS — BP 100/60 | HR 74 | Resp 16

## 2023-07-10 DIAGNOSIS — F0150 Vascular dementia without behavioral disturbance: Secondary | ICD-10-CM

## 2023-07-10 DIAGNOSIS — L97409 Non-pressure chronic ulcer of unspecified heel and midfoot with unspecified severity: Secondary | ICD-10-CM | POA: Insufficient documentation

## 2023-07-10 DIAGNOSIS — L97401 Non-pressure chronic ulcer of unspecified heel and midfoot limited to breakdown of skin: Secondary | ICD-10-CM

## 2023-07-10 DIAGNOSIS — I480 Paroxysmal atrial fibrillation: Secondary | ICD-10-CM | POA: Diagnosis not present

## 2023-07-10 DIAGNOSIS — R532 Functional quadriplegia: Secondary | ICD-10-CM

## 2023-07-10 DIAGNOSIS — E43 Unspecified severe protein-calorie malnutrition: Secondary | ICD-10-CM

## 2023-07-10 NOTE — Progress Notes (Signed)
Subjective:    Patient ID: [name], male    DOB: 01-Nov-1932, 89 y.o.   MRN: [id number]  HPI Home visit for follow up of hospitalizations Daughter and wife here  Has had weakness and progressive weight loss Eating okay Hospitalized twice--went to rehab but continued to worsen Then back to hospital Treated for UTI--but didn't hlep Decision was to have him go home with hospice Now has aides 8-12 in AM, then 5-9:30PM Some help inbetween---like hospice aides for bath etc  Has had confusion and delirium Bed bound now Mostly incontinent--had a urinal but doesn't seem to be able to figure it out now  Did seem to have pain last night Got morphine for the first time---it helped  No SOB No chest pain  Has ulcers on heels---hospice is changing dressings  Current Outpatient Medications on File Prior to Visit  Medication Sig Dispense Refill   acetaminophen (TYLENOL) 325 MG tablet Take 650 mg by mouth every 6 (six) hours as needed. Reported on 01/17/2016     bisacodyl (DULCOLAX) 10 MG suppository Place 1 suppository (10 mg total) rectally daily as needed for severe constipation.     LORazepam (ATIVAN) 0.5 MG tablet Take 0.5 mg by mouth 2 (two) times daily as needed for anxiety.     morphine (ROXANOL) 20 MG/ML concentrated solution Take 5 mg by mouth every 2 (two) hours as needed for severe pain.     QUEtiapine (SEROQUEL) 25 MG tablet Take 25 mg by mouth at bedtime.     sertraline (ZOLOFT) 50 MG tablet Take 50 mg by mouth daily.     tamsulosin (FLOMAX) 0.4 MG CAPS capsule Take 0.4 mg by mouth.     pantoprazole (PROTONIX) 40 MG tablet Take 1 tablet (40 mg total) by mouth daily.     No current facility-administered medications on file prior to visit.    Allergies  Allergen Reactions   Augmentin [Amoxicillin-Pot Clavulanate] Rash   Colchicine Rash   Morphine And Codeine     Shaking, back pain.    Past Medical History:  Diagnosis Date   Acute diverticulitis 09/2014   [facility]    Allergic rhinitis    BPH (benign prostatic hyperplasia)    CAD (coronary artery disease)    a. 10/2013 MV: low risk; b. 03/2020 Cath: mild-mod nonobs dzs; c. 08/2022 MV: tiny, mildly severe apical lateral ischemia; d. 01/2023 Cath/PCI: LCX 28m (2.25x38 Onyx Frontier DES - dil up to 3mm), OM1 70 (2x15 Onyx Frontier DES); e. 01/2023 relook Cath: LM/LAD no signif dzs, D2 40, LCX/OM1 patent stents, RCA 10p/m/d-->Med Rx.   Cataracts, bilateral    Chronic constipation 01/10/2015   Diastolic dysfunction    a. 09/2009 Echo: Ef 55%, no rwma, GrII DD, mild MR, mildly dil LA, nl RV fxn. PASP 25-11mmHg; b. 06/2022 Echo: EF 60%, GrII DD, nl RV fxn, mild AI.   HOH (hard of hearing)    Hyperlipemia    Hypertension    Osteoarthritis    PAF (paroxysmal atrial fibrillation) (HCC) 09/2009   a. CHA2DS2VASc = 4-->eliquis; b. Prev on flec->d/c 2021 after cath showed nonobs CAD; c. 07/2020 Recurrent AF->Amio-->[facility]). Amio d/c'd early 2022.   Presence of permanent cardiac pacemaker    Sick sinus syndrome (HCC)    a. 10/2009 s/p MDT Adapta ADDRL1 DC PPM; b. 04/2023 s/p Gen change   Sigmoid volvulus [facility])     Past Surgical History:  Procedure Laterality Date   CATARACT EXTRACTION [facility] Left 01/16/2017  Procedure: CATARACT EXTRACTION PHACO AND INTRAOCULAR LENS PLACEMENT (IOC);  Surgeon: Sallee Lange, MD;  Location: [facility] ORS;  Service: Ophthalmology;  Laterality: Left;  [place] 1:51.3 AP% 25.9 CDE 54.80 FLUID PACK LOT # [id number]   COLONOSCOPY WITH PROPOFOL N/A 09/15/2018   Procedure: COLONOSCOPY WITH PROPOFOL;  Surgeon: Toney Reil, MD;  Location: [facility] ENDOSCOPY;  Service: Gastroenterology;  Laterality: N/A;   dual chamber pacemaker  01/11   Dr. Orpah Melter SIGMOIDOSCOPY N/A 10/07/2018   Procedure: FLEXIBLE SIGMOIDOSCOPY;  Surgeon: Pasty Spillers, MD;  Location: [facility] ENDOSCOPY;  Service: Endoscopy;  Laterality: N/A;   LAPAROSCOPIC CHOLECYSTECTOMY  1/14   Dr Lemar Livings   PARTIAL  COLECTOMY N/A 10/13/2018   Procedure: LAPAROTOMY, LEFT COLECTOMY,;  Surgeon: Ancil Linsey, MD;  Location: [facility] ORS;  Service: General;  Laterality: N/A;   VASECTOMY  in his 70's    Family History  Problem Relation Age of Onset   Hypertension Mother    Alcohol abuse Father    Bladder Cancer Neg Hx    Prostate cancer Neg Hx    Kidney cancer Neg Hx     Social History   Socioeconomic History   Marital status: Married    Spouse name: Not on file   Number of children: 3   Years of education: Not on file   Highest education level: Not on file  Occupational History   Occupation: Retired    Associate Professor: RETIRED    Comment: Animator  Tobacco Use   Smoking status: Former    Passive exposure: Past   Smokeless tobacco: Never   Tobacco comments:    In [employer], quit in 1960's  Vaping Use   Vaping status: Never Used  Substance and Sexual Activity   Alcohol use: No   Drug use: No   Sexual activity: Yes  Other Topics Concern   Not on file  Social History Narrative   2 Daughters, one son killed in MVA      Has living will   Wife, then daughter Selena Batten, is health care POA   Would accept resuscitation attempts but no prolonged life support.   No tube feeding if cognitively unaware   Social Determinants of Health   Financial Resource Strain: Not on file  Food Insecurity: Patient Declined (06/25/2023)   Hunger Vital Sign    Worried About Running Out of Food in the Last Year: Patient declined    Ran Out of Food in the Last Year: Patient declined  Transportation Needs: Unmet Transportation Needs (06/25/2023)   PRAPARE - Administrator, Civil Service (Medical): Yes    Lack of Transportation (Non-Medical): Yes  Physical Activity: Not on file  Stress: Not on file  Social Connections: Not on file  Intimate Partner Violence: Not At Risk (06/25/2023)   Humiliation, Afraid, Rape, and Kick questionnaire    Fear of Current or Ex-Partner: No    Emotionally Abused: No     Physically Abused: No    Sexually Abused: No   Review of Systems Some constipation--but variable. Has dulcolax for prn Is on pantoprazole     Objective:   Physical Exam Constitutional:      Comments: Alert--then would doze off Marked wasting  Cardiovascular:     Rate and Rhythm: Normal rate and regular rhythm.     Heart sounds: No murmur heard.    No gallop.     Comments: No pedal pulses Pulmonary:     Effort: Pulmonary effort is normal.  Breath sounds: Normal breath sounds. No wheezing or rales.  Abdominal:     Palpations: Abdomen is soft.     Tenderness: There is no abdominal tenderness.  Musculoskeletal:     Cervical back: Neck supple.     Right lower leg: No edema.     Left lower leg: No edema.  Lymphadenopathy:     Cervical: No cervical adenopathy.  Skin:    Comments: Heel ulcers covered---stage 2 ? (Hospice managing)  Neurological:     Comments: Bed bound 2+/5 in arms, 2/5 in legs            Assessment & Plan:

## 2023-07-10 NOTE — Assessment & Plan Note (Signed)
Hospice managing No secondary infection

## 2023-07-10 NOTE — Assessment & Plan Note (Signed)
Barely any use even of arms--mostly has to be fed Family/aides do all ADLs Incontinent Now on hospice

## 2023-07-10 NOTE — Assessment & Plan Note (Signed)
Regular now Off apixaban ---would not even use aspirin at this point

## 2023-07-10 NOTE — Assessment & Plan Note (Signed)
With secondary delirium---ongoing Hospice Will stop sertraline and quetiapine Will use lorazepam for prn use

## 2023-07-10 NOTE — Assessment & Plan Note (Signed)
No clear etiology Resistant to just about everything On hospice

## 2023-07-11 ENCOUNTER — Telehealth: Payer: Self-pay | Admitting: Internal Medicine

## 2023-07-11 MED ORDER — TAMSULOSIN HCL 0.4 MG PO CAPS: 0.4000 mg | ORAL_CAPSULE | Freq: Every day | ORAL | 3 refills | Status: DC

## 2023-07-11 NOTE — Telephone Encounter (Signed)
Patient daughter called in and stated that patient is currently out of tamsulosin (FLOMAX) 0.4 MG CAPS capsule and needs a refill on. She wasn't sure if he was already taking it or not but he doesn't have it. She stated that he informed her to call if she had any questions or concerns. She can be reached at [phone].

## 2023-07-11 NOTE — Telephone Encounter (Signed)
Rx sent 

## 2023-07-11 NOTE — Telephone Encounter (Signed)
It was historically added yesterday at his home visit. Will make sure with Dr Alphonsus Sias before sending the refill in.

## 2023-07-12 DIAGNOSIS — E038 Other specified hypothyroidism: Secondary | ICD-10-CM | POA: Diagnosis not present

## 2023-07-12 DIAGNOSIS — E119 Type 2 diabetes mellitus without complications: Secondary | ICD-10-CM | POA: Diagnosis not present

## 2023-07-12 DIAGNOSIS — E559 Vitamin D deficiency, unspecified: Secondary | ICD-10-CM | POA: Diagnosis not present

## 2023-07-12 DIAGNOSIS — I4891 Unspecified atrial fibrillation: Secondary | ICD-10-CM | POA: Diagnosis not present

## 2023-07-12 DIAGNOSIS — E782 Mixed hyperlipidemia: Secondary | ICD-10-CM | POA: Diagnosis not present

## 2023-07-12 DIAGNOSIS — I2510 Atherosclerotic heart disease of native coronary artery without angina pectoris: Secondary | ICD-10-CM | POA: Diagnosis not present

## 2023-07-12 DIAGNOSIS — D649 Anemia, unspecified: Secondary | ICD-10-CM | POA: Diagnosis not present

## 2023-07-12 DIAGNOSIS — D518 Other vitamin B12 deficiency anemias: Secondary | ICD-10-CM | POA: Diagnosis not present

## 2023-07-14 DIAGNOSIS — I480 Paroxysmal atrial fibrillation: Secondary | ICD-10-CM | POA: Diagnosis not present

## 2023-07-14 DIAGNOSIS — R0602 Shortness of breath: Secondary | ICD-10-CM | POA: Diagnosis not present

## 2023-07-14 DIAGNOSIS — I2510 Atherosclerotic heart disease of native coronary artery without angina pectoris: Secondary | ICD-10-CM | POA: Diagnosis not present

## 2023-07-14 DIAGNOSIS — I495 Sick sinus syndrome: Secondary | ICD-10-CM | POA: Diagnosis not present

## 2023-07-14 DIAGNOSIS — K56699 Other intestinal obstruction unspecified as to partial versus complete obstruction: Secondary | ICD-10-CM | POA: Diagnosis not present

## 2023-07-14 DIAGNOSIS — Z7401 Bed confinement status: Secondary | ICD-10-CM | POA: Diagnosis not present

## 2023-07-14 DIAGNOSIS — I10 Essential (primary) hypertension: Secondary | ICD-10-CM | POA: Diagnosis not present

## 2023-07-14 DIAGNOSIS — Z743 Need for continuous supervision: Secondary | ICD-10-CM | POA: Diagnosis not present

## 2023-07-14 DIAGNOSIS — R627 Adult failure to thrive: Secondary | ICD-10-CM | POA: Diagnosis not present

## 2023-07-14 DIAGNOSIS — I440 Atrioventricular block, first degree: Secondary | ICD-10-CM | POA: Diagnosis not present

## 2023-07-14 DIAGNOSIS — R109 Unspecified abdominal pain: Secondary | ICD-10-CM | POA: Diagnosis not present

## 2023-07-14 DIAGNOSIS — R54 Age-related physical debility: Secondary | ICD-10-CM | POA: Diagnosis not present

## 2023-07-14 DIAGNOSIS — R532 Functional quadriplegia: Secondary | ICD-10-CM | POA: Diagnosis not present

## 2023-07-14 DIAGNOSIS — L89152 Pressure ulcer of sacral region, stage 2: Secondary | ICD-10-CM | POA: Diagnosis not present

## 2023-07-14 DIAGNOSIS — E861 Hypovolemia: Secondary | ICD-10-CM | POA: Diagnosis not present

## 2023-07-14 DIAGNOSIS — Z950 Presence of cardiac pacemaker: Secondary | ICD-10-CM | POA: Diagnosis not present

## 2023-07-14 DIAGNOSIS — Z66 Do not resuscitate: Secondary | ICD-10-CM | POA: Diagnosis not present

## 2023-07-14 DIAGNOSIS — K56609 Unspecified intestinal obstruction, unspecified as to partial versus complete obstruction: Secondary | ICD-10-CM | POA: Diagnosis not present

## 2023-07-14 DIAGNOSIS — Z7901 Long term (current) use of anticoagulants: Secondary | ICD-10-CM | POA: Diagnosis not present

## 2023-07-14 DIAGNOSIS — L89613 Pressure ulcer of right heel, stage 3: Secondary | ICD-10-CM | POA: Diagnosis not present

## 2023-07-14 DIAGNOSIS — R300 Dysuria: Secondary | ICD-10-CM | POA: Diagnosis not present

## 2023-07-14 DIAGNOSIS — Z955 Presence of coronary angioplasty implant and graft: Secondary | ICD-10-CM | POA: Diagnosis not present

## 2023-07-14 DIAGNOSIS — R188 Other ascites: Secondary | ICD-10-CM | POA: Diagnosis not present

## 2023-07-14 DIAGNOSIS — K5652 Intestinal adhesions [bands] with complete obstruction: Secondary | ICD-10-CM | POA: Diagnosis not present

## 2023-07-14 DIAGNOSIS — Z87440 Personal history of urinary (tract) infections: Secondary | ICD-10-CM | POA: Diagnosis not present

## 2023-07-14 DIAGNOSIS — Z9049 Acquired absence of other specified parts of digestive tract: Secondary | ICD-10-CM | POA: Diagnosis not present

## 2023-07-14 DIAGNOSIS — R1084 Generalized abdominal pain: Secondary | ICD-10-CM | POA: Diagnosis not present

## 2023-07-14 DIAGNOSIS — Z4682 Encounter for fitting and adjustment of non-vascular catheter: Secondary | ICD-10-CM | POA: Diagnosis not present

## 2023-07-14 DIAGNOSIS — Z515 Encounter for palliative care: Secondary | ICD-10-CM | POA: Diagnosis not present

## 2023-07-16 ENCOUNTER — Other Ambulatory Visit (HOSPITAL_COMMUNITY): Payer: Self-pay

## 2023-07-30 ENCOUNTER — Other Ambulatory Visit (HOSPITAL_COMMUNITY): Payer: Self-pay

## 2023-08-09 DEATH — deceased
